# Patient Record
Sex: Male | Born: 1940
Health system: Southern US, Community
[De-identification: ages and names within clinical notes are randomized; demographics above are authoritative.]

## PROBLEM LIST (undated history)

## (undated) DIAGNOSIS — R569 Unspecified convulsions: Secondary | ICD-10-CM

## (undated) DIAGNOSIS — J302 Other seasonal allergic rhinitis: Secondary | ICD-10-CM

## (undated) DIAGNOSIS — I499 Cardiac arrhythmia, unspecified: Secondary | ICD-10-CM

## (undated) DIAGNOSIS — K219 Gastro-esophageal reflux disease without esophagitis: Secondary | ICD-10-CM

## (undated) DIAGNOSIS — J449 Chronic obstructive pulmonary disease, unspecified: Secondary | ICD-10-CM

## (undated) DIAGNOSIS — Z974 Presence of external hearing-aid: Secondary | ICD-10-CM

## (undated) DIAGNOSIS — J42 Unspecified chronic bronchitis: Secondary | ICD-10-CM

## (undated) DIAGNOSIS — I779 Disorder of arteries and arterioles, unspecified: Secondary | ICD-10-CM

## (undated) DIAGNOSIS — I219 Acute myocardial infarction, unspecified: Secondary | ICD-10-CM

## (undated) DIAGNOSIS — R7303 Prediabetes: Secondary | ICD-10-CM

## (undated) DIAGNOSIS — I6529 Occlusion and stenosis of unspecified carotid artery: Secondary | ICD-10-CM

## (undated) DIAGNOSIS — A879 Viral meningitis, unspecified: Secondary | ICD-10-CM

## (undated) DIAGNOSIS — I639 Cerebral infarction, unspecified: Secondary | ICD-10-CM

## (undated) DIAGNOSIS — R053 Chronic cough: Secondary | ICD-10-CM

## (undated) DIAGNOSIS — Z972 Presence of dental prosthetic device (complete) (partial): Secondary | ICD-10-CM

## (undated) DIAGNOSIS — R011 Cardiac murmur, unspecified: Secondary | ICD-10-CM

## (undated) DIAGNOSIS — M1990 Unspecified osteoarthritis, unspecified site: Secondary | ICD-10-CM

## (undated) DIAGNOSIS — E7800 Pure hypercholesterolemia, unspecified: Secondary | ICD-10-CM

## (undated) DIAGNOSIS — I700 Atherosclerosis of aorta: Secondary | ICD-10-CM

## (undated) DIAGNOSIS — N289 Disorder of kidney and ureter, unspecified: Secondary | ICD-10-CM

## (undated) DIAGNOSIS — I739 Peripheral vascular disease, unspecified: Secondary | ICD-10-CM

## (undated) DIAGNOSIS — J45909 Unspecified asthma, uncomplicated: Secondary | ICD-10-CM

## (undated) DIAGNOSIS — H3530 Unspecified macular degeneration: Secondary | ICD-10-CM

## (undated) DIAGNOSIS — I10 Essential (primary) hypertension: Secondary | ICD-10-CM

## (undated) DIAGNOSIS — I6523 Occlusion and stenosis of bilateral carotid arteries: Secondary | ICD-10-CM

## (undated) DIAGNOSIS — I2510 Atherosclerotic heart disease of native coronary artery without angina pectoris: Secondary | ICD-10-CM

## (undated) DIAGNOSIS — R05 Cough: Secondary | ICD-10-CM

## (undated) DIAGNOSIS — J40 Bronchitis, not specified as acute or chronic: Secondary | ICD-10-CM

## (undated) DIAGNOSIS — H9190 Unspecified hearing loss, unspecified ear: Secondary | ICD-10-CM

## (undated) DIAGNOSIS — G459 Transient cerebral ischemic attack, unspecified: Secondary | ICD-10-CM

## (undated) HISTORY — DX: Other seasonal allergic rhinitis: J30.2

## (undated) HISTORY — PX: URETERAL STENT PLACEMENT: SHX822

## (undated) HISTORY — DX: Unspecified macular degeneration: H35.30

## (undated) HISTORY — DX: Cerebral infarction, unspecified: I63.9

## (undated) HISTORY — DX: Acute myocardial infarction, unspecified: I21.9

## (undated) HISTORY — DX: Disorder of arteries and arterioles, unspecified: I77.9

## (undated) HISTORY — DX: Unspecified chronic bronchitis: J42

## (undated) HISTORY — DX: Unspecified convulsions: R56.9

## (undated) HISTORY — DX: Peripheral vascular disease, unspecified: I73.9

## (undated) HISTORY — PX: CORONARY ARTERY BYPASS GRAFT: SHX141

## (undated) HISTORY — DX: Disorder of kidney and ureter, unspecified: N28.9

## (undated) HISTORY — PX: SKIN LESION EXCISION: SHX2412

## (undated) HISTORY — DX: Essential (primary) hypertension: I10

---

## 1989-04-28 DIAGNOSIS — G459 Transient cerebral ischemic attack, unspecified: Secondary | ICD-10-CM

## 1989-04-28 HISTORY — PX: CAROTID ENDARTERECTOMY: SUR193

## 1989-04-28 HISTORY — DX: Transient cerebral ischemic attack, unspecified: G45.9

## 2003-11-08 ENCOUNTER — Other Ambulatory Visit: Payer: Self-pay

## 2004-11-12 ENCOUNTER — Ambulatory Visit: Payer: Self-pay | Admitting: Surgery

## 2005-07-17 ENCOUNTER — Ambulatory Visit: Payer: Self-pay

## 2005-11-03 ENCOUNTER — Ambulatory Visit: Payer: Self-pay

## 2005-12-09 ENCOUNTER — Ambulatory Visit: Payer: Self-pay | Admitting: Surgery

## 2006-12-03 ENCOUNTER — Ambulatory Visit: Payer: Self-pay | Admitting: Surgery

## 2007-03-19 ENCOUNTER — Emergency Department: Payer: Self-pay | Admitting: Emergency Medicine

## 2007-03-19 ENCOUNTER — Other Ambulatory Visit: Payer: Self-pay

## 2007-05-06 ENCOUNTER — Ambulatory Visit: Payer: Self-pay | Admitting: Gastroenterology

## 2008-01-18 ENCOUNTER — Ambulatory Visit: Payer: Self-pay | Admitting: Family Medicine

## 2009-04-02 ENCOUNTER — Ambulatory Visit: Payer: Self-pay | Admitting: Gastroenterology

## 2009-08-20 ENCOUNTER — Ambulatory Visit: Payer: Self-pay | Admitting: Vascular Surgery

## 2009-12-12 ENCOUNTER — Ambulatory Visit: Payer: Self-pay | Admitting: Surgery

## 2012-02-07 ENCOUNTER — Ambulatory Visit: Payer: Self-pay | Admitting: Internal Medicine

## 2012-04-28 HISTORY — PX: CAROTID ENDARTERECTOMY: SUR193

## 2012-09-10 ENCOUNTER — Ambulatory Visit: Payer: Self-pay | Admitting: Emergency Medicine

## 2012-10-28 ENCOUNTER — Ambulatory Visit: Payer: Self-pay | Admitting: Vascular Surgery

## 2012-11-08 ENCOUNTER — Ambulatory Visit: Payer: Self-pay | Admitting: Vascular Surgery

## 2012-11-08 LAB — CREATININE, SERUM
Creatinine: 1.46 mg/dL — ABNORMAL HIGH (ref 0.60–1.30)
EGFR (African American): 55 — ABNORMAL LOW
EGFR (Non-African Amer.): 47 — ABNORMAL LOW

## 2012-11-08 LAB — BUN: BUN: 23 mg/dL — ABNORMAL HIGH (ref 7–18)

## 2012-11-17 ENCOUNTER — Ambulatory Visit: Payer: Self-pay | Admitting: Vascular Surgery

## 2012-11-17 LAB — BASIC METABOLIC PANEL
Anion Gap: 5 — ABNORMAL LOW (ref 7–16)
BUN: 30 mg/dL — ABNORMAL HIGH (ref 7–18)
Calcium, Total: 8.9 mg/dL (ref 8.5–10.1)
Chloride: 105 mmol/L (ref 98–107)
Co2: 28 mmol/L (ref 21–32)
Creatinine: 1.58 mg/dL — ABNORMAL HIGH (ref 0.60–1.30)
EGFR (African American): 50 — ABNORMAL LOW
EGFR (Non-African Amer.): 43 — ABNORMAL LOW
Glucose: 129 mg/dL — ABNORMAL HIGH (ref 65–99)
Osmolality: 284 (ref 275–301)
Potassium: 4.2 mmol/L (ref 3.5–5.1)
Sodium: 138 mmol/L (ref 136–145)

## 2012-11-17 LAB — CBC
HCT: 38.3 % — ABNORMAL LOW (ref 40.0–52.0)
HGB: 13.1 g/dL (ref 13.0–18.0)
MCH: 35.2 pg — ABNORMAL HIGH (ref 26.0–34.0)
MCHC: 34.3 g/dL (ref 32.0–36.0)
MCV: 103 fL — ABNORMAL HIGH (ref 80–100)
Platelet: 285 10*3/uL (ref 150–440)
RBC: 3.73 10*6/uL — ABNORMAL LOW (ref 4.40–5.90)
RDW: 14 % (ref 11.5–14.5)
WBC: 8.9 10*3/uL (ref 3.8–10.6)

## 2012-11-24 ENCOUNTER — Inpatient Hospital Stay: Payer: Self-pay | Admitting: Vascular Surgery

## 2012-11-25 LAB — CBC WITH DIFFERENTIAL/PLATELET
Basophil #: 0.1 10*3/uL (ref 0.0–0.1)
Basophil %: 0.5 %
Eosinophil #: 0.1 10*3/uL (ref 0.0–0.7)
Eosinophil %: 0.5 %
HCT: 34.2 % — ABNORMAL LOW (ref 40.0–52.0)
HGB: 12 g/dL — ABNORMAL LOW (ref 13.0–18.0)
Lymphocyte #: 1.3 10*3/uL (ref 1.0–3.6)
Lymphocyte %: 11.6 %
MCH: 35.1 pg — ABNORMAL HIGH (ref 26.0–34.0)
MCHC: 35.1 g/dL (ref 32.0–36.0)
MCV: 100 fL (ref 80–100)
Monocyte #: 1 x10 3/mm (ref 0.2–1.0)
Monocyte %: 9.1 %
Neutrophil #: 9 10*3/uL — ABNORMAL HIGH (ref 1.4–6.5)
Neutrophil %: 78.3 %
Platelet: 211 10*3/uL (ref 150–440)
RBC: 3.42 10*6/uL — ABNORMAL LOW (ref 4.40–5.90)
RDW: 13.4 % (ref 11.5–14.5)
WBC: 11.5 10*3/uL — ABNORMAL HIGH (ref 3.8–10.6)

## 2012-11-25 LAB — BASIC METABOLIC PANEL
Anion Gap: 6 — ABNORMAL LOW (ref 7–16)
BUN: 18 mg/dL (ref 7–18)
Calcium, Total: 8.2 mg/dL — ABNORMAL LOW (ref 8.5–10.1)
Chloride: 107 mmol/L (ref 98–107)
Co2: 24 mmol/L (ref 21–32)
Creatinine: 1.48 mg/dL — ABNORMAL HIGH (ref 0.60–1.30)
EGFR (African American): 54 — ABNORMAL LOW
EGFR (Non-African Amer.): 47 — ABNORMAL LOW
Glucose: 128 mg/dL — ABNORMAL HIGH (ref 65–99)
Osmolality: 277 (ref 275–301)
Potassium: 4.2 mmol/L (ref 3.5–5.1)
Sodium: 137 mmol/L (ref 136–145)

## 2012-11-25 LAB — APTT: Activated PTT: 34.9 secs (ref 23.6–35.9)

## 2012-11-25 LAB — PATHOLOGY REPORT

## 2012-11-25 LAB — PROTIME-INR
INR: 1
Prothrombin Time: 13.4 secs (ref 11.5–14.7)

## 2012-12-04 ENCOUNTER — Ambulatory Visit: Payer: Self-pay | Admitting: Internal Medicine

## 2013-04-22 ENCOUNTER — Ambulatory Visit: Payer: Self-pay | Admitting: Physician Assistant

## 2013-10-01 ENCOUNTER — Inpatient Hospital Stay: Payer: Self-pay | Admitting: Internal Medicine

## 2013-10-01 LAB — CBC
HCT: 42 % (ref 40.0–52.0)
HGB: 13.9 g/dL (ref 13.0–18.0)
MCH: 33.6 pg (ref 26.0–34.0)
MCHC: 33 g/dL (ref 32.0–36.0)
MCV: 102 fL — ABNORMAL HIGH (ref 80–100)
Platelet: 308 10*3/uL (ref 150–440)
RBC: 4.13 10*6/uL — ABNORMAL LOW (ref 4.40–5.90)
RDW: 12.6 % (ref 11.5–14.5)
WBC: 10.5 10*3/uL (ref 3.8–10.6)

## 2013-10-01 LAB — TROPONIN I
Troponin-I: 0.08 ng/mL — ABNORMAL HIGH
Troponin-I: 3.9 ng/mL — ABNORMAL HIGH
Troponin-I: 5.2 ng/mL — ABNORMAL HIGH

## 2013-10-01 LAB — COMPREHENSIVE METABOLIC PANEL
Albumin: 3.6 g/dL (ref 3.4–5.0)
Alkaline Phosphatase: 85 U/L
Anion Gap: 8 (ref 7–16)
BUN: 17 mg/dL (ref 7–18)
Bilirubin,Total: 0.4 mg/dL (ref 0.2–1.0)
Calcium, Total: 8.6 mg/dL (ref 8.5–10.1)
Chloride: 104 mmol/L (ref 98–107)
Co2: 23 mmol/L (ref 21–32)
Creatinine: 1.48 mg/dL — ABNORMAL HIGH (ref 0.60–1.30)
EGFR (African American): 54 — ABNORMAL LOW
EGFR (Non-African Amer.): 46 — ABNORMAL LOW
Glucose: 148 mg/dL — ABNORMAL HIGH (ref 65–99)
Osmolality: 274 (ref 275–301)
Potassium: 3.5 mmol/L (ref 3.5–5.1)
SGOT(AST): 25 U/L (ref 15–37)
SGPT (ALT): 23 U/L (ref 12–78)
Sodium: 135 mmol/L — ABNORMAL LOW (ref 136–145)
Total Protein: 7.6 g/dL (ref 6.4–8.2)

## 2013-10-01 LAB — CK TOTAL AND CKMB (NOT AT ARMC)
CK, Total: 226 U/L
CK, Total: 265 U/L
CK-MB: 17.5 ng/mL — ABNORMAL HIGH (ref 0.5–3.6)
CK-MB: 21.5 ng/mL — ABNORMAL HIGH (ref 0.5–3.6)

## 2013-10-01 LAB — APTT
Activated PTT: 33.3 secs (ref 23.6–35.9)
Activated PTT: 50.7 secs — ABNORMAL HIGH (ref 23.6–35.9)

## 2013-10-01 LAB — PRO B NATRIURETIC PEPTIDE: B-Type Natriuretic Peptide: 624 pg/mL — ABNORMAL HIGH (ref 0–125)

## 2013-10-01 LAB — PROTIME-INR
INR: 1
Prothrombin Time: 13.4 secs (ref 11.5–14.7)

## 2013-10-02 LAB — CBC WITH DIFFERENTIAL/PLATELET
Basophil #: 0.1 10*3/uL (ref 0.0–0.1)
Basophil %: 0.7 %
Eosinophil #: 0.2 10*3/uL (ref 0.0–0.7)
Eosinophil %: 1.7 %
HCT: 41.9 % (ref 40.0–52.0)
HGB: 13.7 g/dL (ref 13.0–18.0)
Lymphocyte #: 1.2 10*3/uL (ref 1.0–3.6)
Lymphocyte %: 11.9 %
MCH: 33.4 pg (ref 26.0–34.0)
MCHC: 32.6 g/dL (ref 32.0–36.0)
MCV: 103 fL — ABNORMAL HIGH (ref 80–100)
Monocyte #: 1.1 x10 3/mm — ABNORMAL HIGH (ref 0.2–1.0)
Monocyte %: 10.2 %
Neutrophil #: 7.9 10*3/uL — ABNORMAL HIGH (ref 1.4–6.5)
Neutrophil %: 75.5 %
Platelet: 331 10*3/uL (ref 150–440)
RBC: 4.09 10*6/uL — ABNORMAL LOW (ref 4.40–5.90)
RDW: 12.9 % (ref 11.5–14.5)
WBC: 10.5 10*3/uL (ref 3.8–10.6)

## 2013-10-02 LAB — BASIC METABOLIC PANEL
Anion Gap: 5 — ABNORMAL LOW (ref 7–16)
BUN: 16 mg/dL (ref 7–18)
Calcium, Total: 8.8 mg/dL (ref 8.5–10.1)
Chloride: 108 mmol/L — ABNORMAL HIGH (ref 98–107)
Co2: 27 mmol/L (ref 21–32)
Creatinine: 1.46 mg/dL — ABNORMAL HIGH (ref 0.60–1.30)
EGFR (African American): 55 — ABNORMAL LOW
EGFR (Non-African Amer.): 47 — ABNORMAL LOW
Glucose: 115 mg/dL — ABNORMAL HIGH (ref 65–99)
Osmolality: 282 (ref 275–301)
Potassium: 4.6 mmol/L (ref 3.5–5.1)
Sodium: 140 mmol/L (ref 136–145)

## 2013-10-02 LAB — LIPID PANEL
Cholesterol: 133 mg/dL (ref 0–200)
HDL Cholesterol: 35 mg/dL — ABNORMAL LOW (ref 40–60)
Ldl Cholesterol, Calc: 75 mg/dL (ref 0–100)
Triglycerides: 114 mg/dL (ref 0–200)
VLDL Cholesterol, Calc: 23 mg/dL (ref 5–40)

## 2013-10-02 LAB — APTT
Activated PTT: 51.7 secs — ABNORMAL HIGH (ref 23.6–35.9)
Activated PTT: 56.6 secs — ABNORMAL HIGH (ref 23.6–35.9)
Activated PTT: 80.8 secs — ABNORMAL HIGH (ref 23.6–35.9)

## 2013-10-02 LAB — TSH: Thyroid Stimulating Horm: 1.48 u[IU]/mL

## 2013-10-02 LAB — HEMOGLOBIN A1C: Hemoglobin A1C: 6.6 % — ABNORMAL HIGH (ref 4.2–6.3)

## 2013-10-02 LAB — CK-MB: CK-MB: 15.1 ng/mL — ABNORMAL HIGH (ref 0.5–3.6)

## 2013-10-03 LAB — BASIC METABOLIC PANEL
Anion Gap: 5 — ABNORMAL LOW (ref 7–16)
BUN: 12 mg/dL (ref 7–18)
Calcium, Total: 8.6 mg/dL (ref 8.5–10.1)
Chloride: 109 mmol/L — ABNORMAL HIGH (ref 98–107)
Co2: 28 mmol/L (ref 21–32)
Creatinine: 1.5 mg/dL — ABNORMAL HIGH (ref 0.60–1.30)
EGFR (African American): 53 — ABNORMAL LOW
EGFR (Non-African Amer.): 46 — ABNORMAL LOW
Glucose: 105 mg/dL — ABNORMAL HIGH (ref 65–99)
Osmolality: 283 (ref 275–301)
Potassium: 4.7 mmol/L (ref 3.5–5.1)
Sodium: 142 mmol/L (ref 136–145)

## 2013-10-03 LAB — APTT: Activated PTT: 106.6 secs — ABNORMAL HIGH (ref 23.6–35.9)

## 2013-10-04 LAB — BASIC METABOLIC PANEL
Anion Gap: 4 — ABNORMAL LOW (ref 7–16)
BUN: 10 mg/dL (ref 7–18)
Calcium, Total: 8.6 mg/dL (ref 8.5–10.1)
Chloride: 106 mmol/L (ref 98–107)
Co2: 30 mmol/L (ref 21–32)
Creatinine: 1.49 mg/dL — ABNORMAL HIGH (ref 0.60–1.30)
EGFR (African American): 53 — ABNORMAL LOW
EGFR (Non-African Amer.): 46 — ABNORMAL LOW
Glucose: 108 mg/dL — ABNORMAL HIGH (ref 65–99)
Osmolality: 279 (ref 275–301)
Potassium: 4 mmol/L (ref 3.5–5.1)
Sodium: 140 mmol/L (ref 136–145)

## 2013-10-04 LAB — APTT: Activated PTT: 36.8 secs — ABNORMAL HIGH (ref 23.6–35.9)

## 2013-10-05 ENCOUNTER — Ambulatory Visit (HOSPITAL_COMMUNITY)
Admission: AD | Admit: 2013-10-05 | Discharge: 2013-10-05 | Disposition: A | Discharge status: Home / Self Care | Payer: Medicare HMO | Source: Ambulatory Visit | Attending: Family Medicine | Admitting: Family Medicine

## 2013-10-05 DIAGNOSIS — R079 Chest pain, unspecified: Secondary | ICD-10-CM | POA: Insufficient documentation

## 2013-10-06 DIAGNOSIS — I10 Essential (primary) hypertension: Secondary | ICD-10-CM | POA: Insufficient documentation

## 2013-10-07 DIAGNOSIS — Z951 Presence of aortocoronary bypass graft: Secondary | ICD-10-CM | POA: Insufficient documentation

## 2013-10-10 DIAGNOSIS — D62 Acute posthemorrhagic anemia: Secondary | ICD-10-CM | POA: Insufficient documentation

## 2013-10-10 DIAGNOSIS — I4891 Unspecified atrial fibrillation: Secondary | ICD-10-CM | POA: Insufficient documentation

## 2014-03-02 DIAGNOSIS — E785 Hyperlipidemia, unspecified: Secondary | ICD-10-CM | POA: Insufficient documentation

## 2014-04-24 ENCOUNTER — Ambulatory Visit: Payer: Self-pay | Admitting: Internal Medicine

## 2014-04-28 DIAGNOSIS — I219 Acute myocardial infarction, unspecified: Secondary | ICD-10-CM

## 2014-04-28 HISTORY — DX: Acute myocardial infarction, unspecified: I21.9

## 2014-06-08 DIAGNOSIS — I10 Essential (primary) hypertension: Secondary | ICD-10-CM | POA: Diagnosis not present

## 2014-06-08 DIAGNOSIS — J310 Chronic rhinitis: Secondary | ICD-10-CM | POA: Diagnosis not present

## 2014-06-08 DIAGNOSIS — K219 Gastro-esophageal reflux disease without esophagitis: Secondary | ICD-10-CM | POA: Diagnosis not present

## 2014-06-08 DIAGNOSIS — E785 Hyperlipidemia, unspecified: Secondary | ICD-10-CM | POA: Diagnosis not present

## 2014-06-28 DIAGNOSIS — Z23 Encounter for immunization: Secondary | ICD-10-CM | POA: Diagnosis not present

## 2014-06-28 DIAGNOSIS — N289 Disorder of kidney and ureter, unspecified: Secondary | ICD-10-CM | POA: Diagnosis not present

## 2014-06-28 DIAGNOSIS — I213 ST elevation (STEMI) myocardial infarction of unspecified site: Secondary | ICD-10-CM | POA: Diagnosis not present

## 2014-06-28 DIAGNOSIS — I639 Cerebral infarction, unspecified: Secondary | ICD-10-CM | POA: Diagnosis not present

## 2014-06-28 DIAGNOSIS — J302 Other seasonal allergic rhinitis: Secondary | ICD-10-CM | POA: Diagnosis not present

## 2014-06-28 DIAGNOSIS — I10 Essential (primary) hypertension: Secondary | ICD-10-CM | POA: Diagnosis not present

## 2014-07-13 DIAGNOSIS — I700 Atherosclerosis of aorta: Secondary | ICD-10-CM | POA: Insufficient documentation

## 2014-07-13 DIAGNOSIS — I25118 Atherosclerotic heart disease of native coronary artery with other forms of angina pectoris: Secondary | ICD-10-CM | POA: Diagnosis not present

## 2014-07-13 DIAGNOSIS — I10 Essential (primary) hypertension: Secondary | ICD-10-CM | POA: Diagnosis not present

## 2014-07-13 DIAGNOSIS — I6529 Occlusion and stenosis of unspecified carotid artery: Secondary | ICD-10-CM | POA: Insufficient documentation

## 2014-07-13 DIAGNOSIS — I2510 Atherosclerotic heart disease of native coronary artery without angina pectoris: Secondary | ICD-10-CM | POA: Insufficient documentation

## 2014-07-13 DIAGNOSIS — N183 Chronic kidney disease, stage 3 unspecified: Secondary | ICD-10-CM | POA: Insufficient documentation

## 2014-07-13 DIAGNOSIS — I6523 Occlusion and stenosis of bilateral carotid arteries: Secondary | ICD-10-CM | POA: Insufficient documentation

## 2014-07-13 DIAGNOSIS — E782 Mixed hyperlipidemia: Secondary | ICD-10-CM | POA: Diagnosis not present

## 2014-08-02 DIAGNOSIS — N183 Chronic kidney disease, stage 3 (moderate): Secondary | ICD-10-CM | POA: Diagnosis not present

## 2014-08-02 DIAGNOSIS — E875 Hyperkalemia: Secondary | ICD-10-CM | POA: Diagnosis not present

## 2014-08-02 DIAGNOSIS — I701 Atherosclerosis of renal artery: Secondary | ICD-10-CM | POA: Diagnosis not present

## 2014-08-18 NOTE — Op Note (Signed)
PATIENT NAME:  Keith Cruz, Keith Cruz MR#:  [id number] DATE OF BIRTH:  10-07-1940  DATE OF PROCEDURE:  11/24/2012  PREOPERATIVE DIAGNOSES: 1.  High-grade left carotid artery stenosis.  2.  Chronic kidney disease.  3.  Hypertension.   POSTOPERATIVE DIAGNOSES: 1.  High-grade left carotid artery stenosis.  2.  Chronic kidney disease.  3.  Hypertension.   PROCEDURE:  Left carotid endarterectomy with CorMatrix extracellular patch reconstruction.   SURGEON: Algernon Huxley, M.D.   ANESTHESIA: General.   ESTIMATED BLOOD LOSS: 50 mL.   INDICATION FOR PROCEDURE: This is a 74 year old gentleman with  long-standing history of carotid artery disease. His ultrasound this summer demonstrated significant progression and a catheter-based angiogram confirmed worsening stenosis. He is brought in for stroke risk reduction due to his high grade left carotid artery stenosis. Risks and benefits were discussed. Informed consent was obtained.   DESCRIPTION OF PROCEDURE: The patient is brought to the operative suite, and after an adequate level of general anesthesia obtained, the left neck and chest sterilely prepped and draped and a sterile surgical field was created. I pulled him over because it will be a break X. The incision was created along the anterior border of the sternocleidomastoid. We dissected down through the platysma with electrocautery. The sternocleidomastoid was retracted laterally. The facial vein was ligated and divided between silk ties exposing the carotid bifurcation. The common carotid artery, external carotid artery, superior thyroid artery and internal carotid artery distal lesion were all encircled with vessel loops. The patient was given 6000 units of intravenous Heparin  systemic anticoagulation and after this was allowed to circulate for 5 minutes, control was pulled up on the vessel loops. An anterior wall arteriotomy was created with an 11 blade and extended with Potts scissors. The Pruitt-Inahara  shunt was then placed first in the internal carotid artery, flushed and de-aired, then in the common carotid artery. Flow was restored within 2 minutes of clamp. I then performed an endarterectomy in the standard fashion. The elevator was used to free the plaque. An eversion endarterectomy was performed on the external carotid artery. The proximal endpoint was cut flush with tenotomy scissors and a nice feathered endpoint was created with gentle traction on the internal carotid artery. All loose flecks were removed, the distal endpoint was tacked down with three 7-0 Prolene sutures. The CorMatrix extracellular patch was then brought onto the field to reconstruct the artery. It was cut and beveled distally, started at the distal endpoint and run approximately one half the length of the arteriotomy with a 6-0 Prolene suture. The patch was then cut and beveled to an appropriate length to match the arteriotomy proximally and a second 6-0 Prolene was started at the proximal endpoint. The medial suture line was run together and tied. The lateral suture line was run until the shunt needed to be removed. The shunt was then removed and the vessel was locally heparinized and flushed from the internal and external and common carotid arteries. The suture line was then completed. Approximately 2 to 3 minutes elapsed from shunt removal to restoration of flow. Several cardiac cycles were allowed to traverse up the external carotid artery prior to release of control of the internal carotid artery. On release, there was a nice pulse in the internal carotid artery. A single 6-0 Prolene patch suture was used for hemostasis. The wound was then irrigated. Surgicel and Evicel topical hemostatic agents were placed and hemostasis was complete. The wound was then closed with 3 interrupted 3-0  Vicryl sutures in the sternocleidomastoid space. The platysma was closed with a running 3-0 Vicryl and skin was closed with 4-0 Monocryl. Dermabond  was placed as a dressing. The patient tolerated the procedure well and was taken to the recovery room in stable condition.   ____________________________ Algernon Huxley, MD jsd:dp D: 11/24/2012 10:15:52 ET T: 11/24/2012 11:18:00 ET JOB#: [id number]  cc: Algernon Huxley, MD, <Dictator> Milinda Pointer. Jacqualine Code, MD  Algernon Huxley MD ELECTRONICALLY SIGNED 12/02/2012 8:15

## 2014-08-18 NOTE — Op Note (Signed)
PATIENT NAME:  Keith Cruz, Keith Cruz MR#:  [id number] DATE OF BIRTH:  1941-01-18  DATE OF PROCEDURE:  11/08/2012  PREOPERATIVE DIAGNOSES: 1.  Carotid artery stenosis.  2.  Chronic kidney disease, precluding CT angiogram.  3.  Renal artery stenosis.  4.  Renovascular hypertension.   POSTOPERATIVE DIAGNOSES: 1.  Carotid artery stenosis.  2.  Chronic kidney disease, precluding CT angiogram.  3.  Renal artery stenosis.  4.  Renovascular hypertension.   PROCEDURES: 1.  Ultrasound guidance for vascular access, right femoral artery.  2.  Catheter placement to left common carotid artery from right femoral approach.  3.  Thoracic aortogram and left cervical and cerebral carotid arteriograms.  4.  StarClose closure device, right femoral artery.   SURGEON: Kiyla Ringler.   ANESTHESIA: Local, with moderate conscious sedation.   BLOOD LOSS: Minimal.   FLUOROSCOPY TIME: 3.8 minutes.   CONTRAST USED: 45 mL.   INDICATION FOR PROCEDURE: This is a 74 year old white male with chronic kidney disease. He has carotid artery stenosis. He has had a previous right carotid endarterectomy. His left carotid artery stenosis has progressed on a recent noninvasive study. I ordered a CT angiogram, but his chronic kidney disease precluded this, and we brought him in for a catheter-based angiogram with a limited amount of contrast for further evaluation to determine the best treatment options for him.   Risks and benefits were discussed. Informed consent was obtained.   DESCRIPTION OF PROCEDURE: The patient was brought to the vascular and interventional radiology suite. Groins were shaved and prepped and a sterile surgical field was created. Ultrasound was used to visualize a patent right femoral artery. This was accessed under direct ultrasound guidance without difficulty with a Seldinger needle. A J wire and 5-French sheath were placed. The patient was given 3000 units of intravenous heparin for systemic anticoagulation and a  pigtail catheter was placed in the ascending aorta. LAO projection thoracic aortogram was performed. This demonstrated normal origins of the innominate left carotid artery and left subclavian artery. On the initial thoracic aortogram you could see that both vertebrals appeared reasonably large and patent. The right carotid artery appeared patent, although the visualization was somewhat limited. He has had previous endarterectomies, and his ultrasound showed this side to be patent.   I then selectively cannulated the left common carotid artery with a Bentson-Hanafee catheter. I advanced in the mid-left common carotid artery. Selective left cervical and cerebral carotid angiography was then performed. This demonstrated no intracranial filling defects that were obvious, with reasonably normal middle cerebral and anterior cerebral artery on initial imaging, although the anterior cerebral was a bit pruned.   The cervical carotid artery demonstrated an approximately 2 cm long irregular stenosis starting just below the carotid bifurcation, extending into the internal carotid artery. This measured out at approximately 80% to 85%. The external carotid artery also had a calcific stenosis. AP, LAO and lateral projections were performed for evaluation of this. After we had several views to evaluate the carotid circulation I elected to terminate the procedure. The diagnostic catheter was removed. An oblique arteriogram was performed through the right femoral sheath, and a StarClose closure device was deployed in the usual fashion with excellent hemostatic result. The patient tolerated the procedure well and was taken to the recovery room in stable condition.     ____________________________ Algernon Huxley, MD jsd:dm D: 11/08/2012 09:15:29 ET T: 11/08/2012 09:38:57 ET JOB#: [id number]  cc: Algernon Huxley, MD, <Dictator> Milinda Pointer. Jacqualine Code, MD Algernon Huxley MD  ELECTRONICALLY SIGNED 11/11/2012 9:51

## 2014-08-19 NOTE — H&P (Signed)
PATIENT NAME:  Keith Cruz, Keith Cruz MR#:  [id number] DATE OF BIRTH:  [birth year]  DATE OF ADMISSION:  10/01/2013  PRIMARY CARE PROVIDER: Dr. Jacqualine Code.  EMERGENCY DEPARTMENT REFERRING PHYSICIAN: Dr. Jasmine December.   CHIEF COMPLAINT: Chest pain.   HISTORY OF PRESENT ILLNESS: The patient is a 74 year old white male with previous history of paroxysmal A. fib, history of CVA/TIA in 1991, hypertension, hyperlipidemia, GERD, anxiety, chronic renal failure, who presents with chest pain ongoing for about one year. The patient reports that it initially started with substernal sharp pain going across his chest with mowing the lawn and doing other activities, but he told no one. Over the past few months, his pain has progressively gotten worse and now is more frequent, and over the past month, he has been having pain only at rest. Yesterday started having chest pain that was continuous. He finally decided to come to the ED. He also has now pain radiating to the left arm. He also felt nauseous. He has not had any emesis. He is denying any dyspnea on exertion. Denies any fevers, chills. No urinary symptoms.   PAST MEDICAL HISTORY: 1.  History of paroxysmal A. fib.  2.  History of CVA/TIA in 1991.  3.  Hypertension.  4.  Hyperlipidemia.  5.  Gastroesophageal reflux disease.  6.  Anxiety disorder.  7.  Chronic renal failure.   PAST SURGICAL HISTORY: Status post stent to the right kidney. Status post left carotid endarterectomy. Status post right carotid endarterectomy.   ALLERGIES: ALLEGRA-D 12, PREDNISONE, PROCARDIA.   CURRENT MEDICATIONS: He is on vitamin D3 at 2000 international units daily, vitamin B12 at 1000 mcg daily, simvastatin 40 at bedtime, Protonix 40 daily, metoprolol succinate 50 one tab p.o. daily, hydralazine 25 one tab p.o. b.i.d., folic acid 0.8 daily, fexofenadine 180 daily, Plavix 75 p.o. daily, Centrum Silver 1 tab p.o. daily, aspirin 81 one tab p.o. daily, alprazolam 0.5 once a day as needed.    SOCIAL HISTORY: Used to smoke, but quit in 1972. No alcohol or drug use.  FAMILY HISTORY: Positive for hypertension.   REVIEW OF SYSTEMS:    CONSTITUTIONAL: Denies any fevers. Complains of some fatigue and weakness. Complains of chest pain. No weight loss. No weight gain.  EYES: No blurred or double vision. No pain. No redness. No inflammation.  EARS, NOSE, THROAT: No tinnitus. No ear pain. No hearing loss. No seasonal or year-round allergies. No epistaxis. No difficulty swallowing.  RESPIRATORY: Denies any cough, wheezing, hemoptysis. No COPD.  CARDIOVASCULAR: Complains of chest pain as above. Has history of paroxysmal A. fib. Denies any palpitations or syncope.  GASTROINTESTINAL: Complains of some nausea but no vomiting or diarrhea. No abdominal pain. No hematemesis. No melena.  GENITOURINARY: Denies any dysuria, hematuria, renal calculus or frequency.  ENDOCRINE: Denies any polyuria, nocturia.  HEMATOLOGIC AND LYMPHATIC: Denies anemia, easy bruisability or bleeding.  SKIN: Denies acne, rash, changes in mole, hair or skin.  MUSCULOSKELETAL: Denies any pain in the neck, back or shoulder.  NEUROLOGICAL: Has a history of CVA and TIA without any residual effects.  PSYCHIATRIC: Has anxiety but no depression.   PHYSICAL EXAMINATION: VITAL SIGNS: Temperature 98.1, pulse 98, respirations 11, blood pressure 165/79, O2 sat 97%.  GENERAL: The patient is a well-developed male in no acute distress.  HEENT: Head atraumatic, normocephalic. Pupils equally round, reactive to light and accommodation. There is no conjunctival pallor. No scleral icterus. Nasal exam shows no drainage or ulceration. Oropharynx is clear without any exudate.  NECK: Supple without  any JVD.  CARDIOVASCULAR: Regular rate and rhythm. No murmurs, rubs, clicks or gallops.  LUNGS: Clear to auscultation bilaterally without any rales, rhonchi, wheezing.  ABDOMEN: Soft, nontender, nondistended. Positive bowel sounds x 4. No  hepatosplenomegaly.  EXTREMITIES: No clubbing, cyanosis or edema.  SKIN: No rash.  LYMPHATIC: No lymph nodes palpable.  VASCULAR: Good DP, PT pulses.  PSYCHIATRIC: Not anxious or depressed.  NEUROLOGIC: Cranial nerves II through XII grossly intact. No focal deficits.   LABORATORY AND DIAGNOSTIC DATA: EKG shows 1 mm ST depression in leads V3 and V4. Otherwise normal sinus rhythm. Glucose 148, BUN 17, creatinine 1.48; sodium was 135, potassium 3.5, chloride 104; CO2 of 23. LFTs were normal. Troponin 0.08. WBC 10.5, hemoglobin 13.9; platelet count is 308.   ASSESSMENT AND PLAN: The patient is a 74 year old white male with history of severe peripheral vascular disease, hypertension, cerebrovascular accident, transient ischemic attack, presents with chest pain.  1.  Chest pain: Represents unstable angina, possible non-ST myocardial infarction. At this time will continue his aspirin and Plavix, which he was on. Continue the nitroglycerin patch. He is already on a heparin drip, which we will continue. Continue metoprolol, simvastatin. Cardiology consult. The patient will need a cardiac catheterization to further evaluate his symptoms.  2.  Chronic renal failure: Will provide him with normal saline infusion in light of possibly receiving contrast for the cath. Will get nephrology to evaluate the patient.  3.  Hypertension: Continue metoprolol and hydralazine. He will also be on nitroglycerin. 4.  Hyperlipidemia: Continue simvastatin. Will check a lipid panel in the a.m.  5.  Gastroesophageal reflux disease: Will continue Protonix. However, in light of the patient being on Plavix, consider changing him to an H2-blocker.   6.  Elevated blood sugar: Will check a hemoglobin A1c in the morning.    TIME SPENT: 50 minutes.    ____________________________ Lafonda Mosses Posey Pronto, MD shp:jcm D: 10/01/2013 12:54:36 ET T: 10/01/2013 16:24:59 ET JOB#: [id number]  cc: Monroe Qin H. Posey Pronto, MD, <Dictator> Alric Seton  MD ELECTRONICALLY SIGNED 10/11/2013 16:54

## 2014-08-19 NOTE — Discharge Summary (Signed)
PATIENT NAME:  Keith Cruz, Keith Cruz MR#:  [id number] DATE OF BIRTH:  Oct 29, 1940  DATE OF ADMISSION:  10/01/2013 DATE OF DISCHARGE:  10/05/2013  ADMITTING PHYSICIAN:  Shreyang H. Posey Pronto, MD  TRANSFERRING PHYSICIAN:  Gladstone Lighter, MD  PRIMARY CARE PHYSICIAN: Milinda Pointer. [place], [place]:  1.  Cardiology consultation by Corey Skains, MD. 2.  Nephrology consultation by Tama High, MD.  TRANSFER-ACCEPTING FACILITY:  [facility].   DISCHARGE DIAGNOSES: 1.  Non-ST segment elevation myocardial infarction.  2.  Coronary artery disease, status post 3-vessel disease, awaiting bypass graft surgery.  3.  Hypertension.  4.  Bilateral carotid stenosis, status post intervention in the past.  5.  Right renal artery stenosis, status post stent. 6.  Chronic kidney disease stage 3, baseline creatinine around 1.4 to 1.5.   DISCHARGE HOME MEDICATIONS:  1.  Folic acid 0.8 mg p.o. daily.  2.  Vitamin B12 1000 mcg p.o. daily.  3.  Simvastatin 40 mg p.o. daily.  4.  Protonix 40 mg p.o. daily.  5.  Xanax 0.25 mg orally once a day as needed.  6.  Fexofenadine 180 mg p.o. daily.  7.  Centrum Silver 1 tablet p.o. daily.  8.  Aspirin 81 mg p.o. daily.  9.  Vitamin D3, 2000 international units p.o. daily.  10.  Metoprolol 50 mg p.o. b.i.d.  11.  Hydralazine 25 mg p.o. b.i.d.  12.  Nitroglycerin 2 inches topically twice a day.   13.  Nitroglycerin 0.4 mg sublingual as needed for chest pain.  14.  Lovenox 40 mg subcutaneously once a day.  15.  Plavix is currently on hold.   DISCHARGE DIET: Low-sodium, low-fat diet.   DISCHARGE ACTIVITY: As tolerated.    FOLLOWUP INSTRUCTIONS: Patient is being transferred to [facility] for bypass graft surgery.   LABORATORY, DIAGNOSTIC AND RADIOLOGICAL DATA PRIOR TO DISCHARGE:   1.  Sodium 140, potassium 4.0, chloride 106, bicarbonate 30, BUN 10, creatinine 1.49, glucose 108 and calcium of 8.6.  2.  WBC is 10.5, hemoglobin 13.7, hematocrit  41.9, platelet count 331.  3.  Echocardiogram showing normal LV ejection fraction, ejection fraction of 50% to 50%, impaired relaxation pattern of LV diastolic filling, mild left ventricular hypertrophy noted.  4.  Cardiac catheterization done on October 03, 2013, showing normal LV function, ejection fraction of 55%, critical 3-vessel coronary artery disease, mild aortic atherosclerosis. The patient had 95% stenosis in proximal LAD, ostial LAD is 85% stenosis, first diagonal is 75% stenosis, proximal RCA 85% stenosis, mid RCA 90% stenosis, right PDA 60% stenosis and right posterolateral segment there is 50% stenosis.  Recommendation is bypass graft surgery.  5.  The patient's troponins were elevated on admission as high as 5.2, second set was 3.90, first set was 0.08.  6.  Chest x-ray showing left basilar atelectasis, otherwise clear lung fields, heart is slightly enlarged.  7.  His HbA1c 6.6.  8.  LDL cholesterol 75, HDL 35, triglycerides 114, total cholesterol 133.   BRIEF HOSPITAL COURSE: Keith Cruz is a 74 year old Caucasian male with past medical history significant for hypertension, peripheral vascular disease with bilateral carotid stenosis surgery and right renal artery stenosis stent placement, history of paroxysmal atrial fibrillation and cerebrovascular accident in the past and anxiety, presents to the hospital secondary to chest pain.  1.  Non-ST segment elevation myocardial infarction. The patient has been experiencing unstable angina for almost a year but admitted at this time with more pronounced and nonrevealing.  He  had a mild troponin elevation of 0.08 on admission that went up to 5.2. He was started on heparin drip, seen by cardiologist and had a cardiac catheterization done which showed 3-vessel critical stenosis. Bypass graft surgery is recommended and the patient is being transferred over for bypass surgery. The patient is on aspirin. His last dose of Plavix was on October 02, 2013. He is also  on statin, metoprolol at this time. He quit smoking a ng time ago.  2.  Chronic kidney disease stage 3 at baseline, baseline creatinine of 1.4 to 1.5. Seen by nephrology, especially in requiring the cardiac catheterization and dye exposure. The patient's kidney function had remained stable. Nephrology has followed  the patient while in the hospital.  3.  Hypertension. The patient on metoprolol and hydralazine.  4.  Bilateral carotid artery stenosis. The patient is status post carotid endarterectomy with his history of cerebrovascular accident.   5.  Right renal artery stenosis, status post stent placement.  6.  Paroxysmal atrial fibrillation, not sure if diagnosed with that, not on any anticoagulation at this time.    DISCHARGE CONDITION: Stable.   DISCHARGE DISPOSITION: To [facility].   TIME SPENT ON DISCHARGE: 45 minutes.   ____________________________ Gladstone Lighter, MD rk:cs D: 10/05/2013 18:14:23 ET T: 10/05/2013 18:42:10 ET JOB#: [id number]  cc: Gladstone Lighter, MD, <Dictator> Gladstone Lighter MD ELECTRONICALLY SIGNED 10/08/2013 15:43

## 2014-08-19 NOTE — Consult Note (Signed)
PATIENT NAME:  Keith Cruz, Keith Cruz MR#:  [id number] DATE OF BIRTH:  1940-05-13  DATE OF CONSULTATION:  10/01/2013  REFERRING PHYSICIAN:  Demetrios Loll, MD CONSULTING PHYSICIAN:  Corey Skains, MD  REASON FOR CONSULTATION: Acute subendocardial myocardial infarction, peripheral vascular disease, cerebrovascular accident, chronic kidney disease, and hypertension.   CHIEF COMPLAINT: "I had chest pain."   HISTORY OF PRESENT ILLNESS: This is a 74 year old male with peripheral vascular disease, status post renal artery stenosis and stenting, who has had some claudication-type symptoms in the past and has had some shortness of breath with physical activity and mild chest discomfort waxing and waning over the last 6 to 8 months, but significantly causing chest pain that did not go away over the last several hours. The patient has had some other issues, with an EKG showing lateral ST depression consistent with subendocardial myocardial ischemia with an elevated troponin of 0.08. The patient also has chronic kidney disease with a creatinine of 1.4 and hypertension on appropriate medication management. The patient has had other concerns with physical activity levels with severe shortness of breath, weakness and fatigue, multifactorial in nature. This includes the possibility of chronic obstructive pulmonary disease with some tobacco abuse.  REVIEW OF SYSTEMS: Remainder is negative for vision change, ringing in the ears, hearing loss, cough, congestion, heartburn, nausea, vomiting, diarrhea, bloody stools, stomach pain, extremity pain, leg weakness, cramping of the buttocks, known blood clots, headaches,  blackouts, dizzy spells, nosebleeds, congestion, trouble swallowing, frequent urination, urination at night, muscle weakness, numbness, anxiety, depression, skin lesions, skin rashes.   PAST MEDICAL HISTORY: 1.  Peripheral vascular disease.  2.  Hypertension.  3.  Hyperlipidemia.  4.  Chronic obstructive pulmonary  disease.   FAMILY HISTORY: Multiple family members with early onset of cardiovascular disease and hypertension.   SOCIAL HISTORY: He currently says he does smoke on occasion. Denies alcohol use.   ALLERGIES: AS LISTED.   MEDICATIONS: As listed.   PHYSICAL EXAMINATION: VITAL SIGNS: Blood pressure is 122/68 bilaterally. Heart rate 72 upright, reclining, and regular.  GENERAL: He is a well-appearing male in no acute distress.  HEAD, EYES, EARS, NOSE, THROAT:  No icterus, thyromegaly, ulcers, hemorrhage, or xanthelasma.  CARDIOVASCULAR: Regular rate and rhythm. Normal S1 and S2 without murmur, gallop, or rub. PMI is inferiorly displaced. Carotid upstroke normal with bruit.  LUNGS: Diffuse wheezes.  ABDOMEN: Soft, nontender, without hepatosplenomegaly or masses, with abdominal bruit.  EXTREMITIES: Show 2+ radial, trace dorsal pedal, and 1+ femoral artery changes without evidence of lower extremity edema, cyanosis, clubbing, or ulcers.  NEUROLOGIC: He is oriented to time, place, and person, with normal mood and affect.   ASSESSMENT: This is a 74 year old male with hypertension, hyperlipidemia, peripheral vascular disease, chronic obstructive pulmonary disease with substernal chest pain, EKG changes, elevated troponin consistent with minimal subendocardial myocardial infarction, needing further medical management.   RECOMMENDATIONS: 1.  Nitrates, aspirin, beta blocker for coronary artery disease and myocardial infarction.  2.  Oxygen and heparin.  3.  Proceed to cardiac catheterization to assess coronary anatomy and further treatment thereof as necessary. The patient understands the risks and benefits of cardiac catheterization. This includes the possibility of death, stroke, heart attack, infection, bleeding or blood clot. He is at low risk for conscious sedation.  4.  Echocardiogram for LV systolic dysfunction and valvular heart disease.  5.  Hypertension control as per above.  6.  Further  diagnostic testing and treatment options after above.    ____________________________ Corey Skains, MD  bjk:jcm D: 10/01/2013 15:21:48 ET T: 10/01/2013 20:39:57 ET JOB#: [id number]  cc: Corey Skains, MD, <Dictator> Corey Skains MD ELECTRONICALLY SIGNED 10/04/2013 14:46

## 2014-08-31 DIAGNOSIS — H65111 Acute and subacute allergic otitis media (mucoid) (sanguinous) (serous), right ear: Secondary | ICD-10-CM | POA: Diagnosis not present

## 2014-08-31 DIAGNOSIS — R0989 Other specified symptoms and signs involving the circulatory and respiratory systems: Secondary | ICD-10-CM | POA: Diagnosis not present

## 2014-08-31 DIAGNOSIS — F419 Anxiety disorder, unspecified: Secondary | ICD-10-CM | POA: Diagnosis not present

## 2014-09-05 DIAGNOSIS — I10 Essential (primary) hypertension: Secondary | ICD-10-CM | POA: Diagnosis not present

## 2014-09-05 DIAGNOSIS — H65111 Acute and subacute allergic otitis media (mucoid) (sanguinous) (serous), right ear: Secondary | ICD-10-CM | POA: Diagnosis not present

## 2014-09-05 DIAGNOSIS — H9201 Otalgia, right ear: Secondary | ICD-10-CM | POA: Diagnosis not present

## 2014-09-22 DIAGNOSIS — N289 Disorder of kidney and ureter, unspecified: Secondary | ICD-10-CM | POA: Diagnosis not present

## 2014-09-22 DIAGNOSIS — J302 Other seasonal allergic rhinitis: Secondary | ICD-10-CM | POA: Diagnosis not present

## 2014-09-22 DIAGNOSIS — R7309 Other abnormal glucose: Secondary | ICD-10-CM | POA: Diagnosis not present

## 2014-09-22 DIAGNOSIS — I10 Essential (primary) hypertension: Secondary | ICD-10-CM | POA: Diagnosis not present

## 2014-09-22 DIAGNOSIS — E785 Hyperlipidemia, unspecified: Secondary | ICD-10-CM | POA: Diagnosis not present

## 2014-09-22 DIAGNOSIS — K219 Gastro-esophageal reflux disease without esophagitis: Secondary | ICD-10-CM | POA: Diagnosis not present

## 2014-09-28 ENCOUNTER — Other Ambulatory Visit: Payer: Self-pay | Admitting: Family Medicine

## 2014-09-28 ENCOUNTER — Telehealth: Payer: Self-pay | Admitting: Family Medicine

## 2014-09-28 ENCOUNTER — Other Ambulatory Visit: Payer: Self-pay

## 2014-09-28 DIAGNOSIS — E785 Hyperlipidemia, unspecified: Secondary | ICD-10-CM

## 2014-09-28 NOTE — Telephone Encounter (Signed)
Patient will pick up new order.[facility]

## 2014-09-28 NOTE — Progress Notes (Unsigned)
Advised patient and they will follow up as needed. Will stop fruits and pick yp lab order for BMP in 2 weeks.[facility], [place]    This encounter was created in error - please disregard.

## 2014-09-28 NOTE — Telephone Encounter (Signed)
Pt needs a lab order.  He was to recheck labs in 2 weeks.  Please call when ready

## 2014-09-28 NOTE — Telephone Encounter (Signed)
Patient need an order to recheck labs from 2 weeks ago.

## 2014-10-10 DIAGNOSIS — I10 Essential (primary) hypertension: Secondary | ICD-10-CM | POA: Diagnosis not present

## 2014-10-10 DIAGNOSIS — E785 Hyperlipidemia, unspecified: Secondary | ICD-10-CM | POA: Diagnosis not present

## 2014-10-10 DIAGNOSIS — I701 Atherosclerosis of renal artery: Secondary | ICD-10-CM | POA: Diagnosis not present

## 2014-10-10 DIAGNOSIS — I6529 Occlusion and stenosis of unspecified carotid artery: Secondary | ICD-10-CM | POA: Diagnosis not present

## 2014-10-26 ENCOUNTER — Telehealth: Payer: Self-pay

## 2014-10-26 ENCOUNTER — Other Ambulatory Visit: Payer: Self-pay

## 2014-10-26 DIAGNOSIS — R7309 Other abnormal glucose: Secondary | ICD-10-CM

## 2014-10-26 NOTE — Telephone Encounter (Signed)
Ok-jh 

## 2014-10-26 NOTE — Telephone Encounter (Signed)
Patient came by for lab order from a lab elevated 2 weeks ago. I could not see results and called [facility] they said A1C was done 5/27 so I reordered CMP as patient stated that this lab was due to elevated glucose. He kept saying it was A1C but we just did one. Can not get into [facility] to verify. I hope this is right.

## 2014-10-27 LAB — BASIC METABOLIC PANEL
BUN/Creatinine Ratio: 13 (ref 10–22)
BUN: 19 mg/dL (ref 8–27)
CO2: 24 mmol/L (ref 18–29)
Calcium: 9.5 mg/dL (ref 8.6–10.2)
Chloride: 101 mmol/L (ref 97–108)
Creatinine, Ser: 1.44 mg/dL — ABNORMAL HIGH (ref 0.76–1.27)
GFR calc Af Amer: 55 mL/min/{1.73_m2} — ABNORMAL LOW (ref >59)
GFR calc non Af Amer: 47 mL/min/{1.73_m2} — ABNORMAL LOW (ref >59)
Glucose: 115 mg/dL — ABNORMAL HIGH (ref 65–99)
Potassium: 4.9 mmol/L (ref 3.5–5.2)
Sodium: 141 mmol/L (ref 134–144)

## 2014-10-27 NOTE — Progress Notes (Signed)
Mailed Results.

## 2014-12-11 DIAGNOSIS — L309 Dermatitis, unspecified: Secondary | ICD-10-CM | POA: Diagnosis not present

## 2014-12-11 DIAGNOSIS — L570 Actinic keratosis: Secondary | ICD-10-CM | POA: Diagnosis not present

## 2014-12-11 DIAGNOSIS — D492 Neoplasm of unspecified behavior of bone, soft tissue, and skin: Secondary | ICD-10-CM | POA: Diagnosis not present

## 2014-12-11 DIAGNOSIS — L821 Other seborrheic keratosis: Secondary | ICD-10-CM | POA: Diagnosis not present

## 2014-12-11 DIAGNOSIS — L299 Pruritus, unspecified: Secondary | ICD-10-CM | POA: Diagnosis not present

## 2014-12-28 DIAGNOSIS — I10 Essential (primary) hypertension: Secondary | ICD-10-CM | POA: Insufficient documentation

## 2015-01-11 DIAGNOSIS — E782 Mixed hyperlipidemia: Secondary | ICD-10-CM | POA: Diagnosis not present

## 2015-01-11 DIAGNOSIS — R0789 Other chest pain: Secondary | ICD-10-CM | POA: Diagnosis not present

## 2015-01-11 DIAGNOSIS — I25810 Atherosclerosis of coronary artery bypass graft(s) without angina pectoris: Secondary | ICD-10-CM | POA: Diagnosis not present

## 2015-01-11 DIAGNOSIS — I25118 Atherosclerotic heart disease of native coronary artery with other forms of angina pectoris: Secondary | ICD-10-CM | POA: Diagnosis not present

## 2015-01-22 ENCOUNTER — Ambulatory Visit (INDEPENDENT_AMBULATORY_CARE_PROVIDER_SITE_OTHER): Payer: Medicare Other | Admitting: Family Medicine

## 2015-01-22 ENCOUNTER — Encounter: Payer: Self-pay | Admitting: Family Medicine

## 2015-01-22 VITALS — BP 129/70 | HR 55 | Temp 98.0°F | Resp 16 | Ht 74.0 in | Wt 223.0 lb

## 2015-01-22 DIAGNOSIS — F32A Depression, unspecified: Secondary | ICD-10-CM

## 2015-01-22 DIAGNOSIS — G4700 Insomnia, unspecified: Secondary | ICD-10-CM | POA: Insufficient documentation

## 2015-01-22 DIAGNOSIS — K219 Gastro-esophageal reflux disease without esophagitis: Secondary | ICD-10-CM

## 2015-01-22 DIAGNOSIS — F329 Major depressive disorder, single episode, unspecified: Secondary | ICD-10-CM | POA: Diagnosis not present

## 2015-01-22 DIAGNOSIS — I2584 Coronary atherosclerosis due to calcified coronary lesion: Secondary | ICD-10-CM

## 2015-01-22 DIAGNOSIS — N289 Disorder of kidney and ureter, unspecified: Secondary | ICD-10-CM | POA: Insufficient documentation

## 2015-01-22 DIAGNOSIS — R7309 Other abnormal glucose: Secondary | ICD-10-CM | POA: Diagnosis not present

## 2015-01-22 DIAGNOSIS — E7800 Pure hypercholesterolemia, unspecified: Secondary | ICD-10-CM

## 2015-01-22 DIAGNOSIS — I2510 Atherosclerotic heart disease of native coronary artery without angina pectoris: Secondary | ICD-10-CM

## 2015-01-22 DIAGNOSIS — Z23 Encounter for immunization: Secondary | ICD-10-CM

## 2015-01-22 DIAGNOSIS — R7303 Prediabetes: Secondary | ICD-10-CM

## 2015-01-22 MED ORDER — SERTRALINE HCL 50 MG PO TABS: 50.0000 mg | ORAL_TABLET | Freq: Every day | ORAL | Status: DC

## 2015-01-22 NOTE — Patient Instructions (Signed)
Start with 1/2 tablet of Zoloft x 6 days before increasing to 1 tablet a day.

## 2015-01-22 NOTE — Progress Notes (Signed)
Name: Keith Cruz   MRN: [id number]    DOB: 1940-09-10   Date:01/22/2015       Progress Note  Subjective  Chief Complaint  Chief Complaint  Patient presents with  . Hypertension    HPI  Here for f/u of pre-diabetes, elevated chol., GERD, ASCVD with 3 vessel bypass , renal stent for obstruction, hyperliopidemia.  C/o difficulty sleeping.  Trouble staying asleep.  Hx. Of "panic attacks" in past.   No problem-specific assessment & plan notes found for this encounter.   Past Medical History  Diagnosis Date  . Hypertension   . Kidney disease   . Seasonal allergies     Social History  Substance Use Topics  . Smoking status: Current Some Day Smoker -- 2.00 packs/day for 10 years    Types: Cigarettes  . Smokeless tobacco: Never Used  . Alcohol Use: No     Comment: previous     Current outpatient prescriptions:  .  Cholecalciferol (VITAMIN D3) 2000 UNITS capsule, Take 2,000 Units by mouth daily., Disp: , Rfl:  .  clobetasol (TEMOVATE) 0.05 % external solution, Apply 2 application topically as needed., Disp: , Rfl:  .  clopidogrel (PLAVIX) 75 MG tablet, Take 75 mg by mouth daily., Disp: , Rfl:  .  Cyanocobalamin (RA VITAMIN B-12 TR) 1000 MCG TBCR, Take 1,000 mcg by mouth daily., Disp: , Rfl:  .  fexofenadine (ALLEGRA) 180 MG tablet, Take 180 mg by mouth daily., Disp: , Rfl:  .  folic acid (FOLVITE) 1 MG tablet, Take 0.8 mg by mouth daily., Disp: , Rfl:  .  LYCOPENE PO, Take 1 tablet by mouth daily., Disp: , Rfl:  .  metoprolol (LOPRESSOR) 50 MG tablet, Take 50 mg by mouth daily., Disp: , Rfl:  .  pantoprazole (PROTONIX) 40 MG tablet, Take 40 mg by mouth daily., Disp: , Rfl:  .  simvastatin (ZOCOR) 40 MG tablet, Take 40 mg by mouth daily., Disp: , Rfl:  .  triamcinolone cream (KENALOG) 0.1 %, Apply 1 application topically as needed., Disp: , Rfl:   Allergies  Allergen Reactions  . Fexofenadine-Pseudoephed Er Swelling    Tongue and face. Only with D product. Tolerates plain  allegra at home.   . Nifedipine Swelling    Tongue and face Tongue and face  . Allegra  [Fexofenadine] Swelling    Tongue and face. Only with D product. Tolerates plain allegra at home.   . Prednisone     Other reaction(s): Other (See Comments) Mental status changes Other reaction(s): Other (See Comments) Mental status changes    Review of Systems  Constitutional: Negative for fever, chills, weight loss and malaise/fatigue.  HENT: Negative for hearing loss.   Eyes: Negative for blurred vision and double vision.  Respiratory: Positive for cough. Negative for sputum production, shortness of breath and wheezing.   Cardiovascular: Negative for chest pain, palpitations, orthopnea and leg swelling.  Gastrointestinal: Negative for heartburn, abdominal pain and blood in stool.  Genitourinary: Negative for dysuria, urgency and frequency.  Musculoskeletal: Negative for myalgias and joint pain.  Neurological: Negative for dizziness, sensory change, focal weakness, seizures, weakness and headaches.  Psychiatric/Behavioral: Positive for depression. The patient is nervous/anxious and has insomnia.       Objective  Filed Vitals:   01/22/15 0827  BP: 129/70  Pulse: 55  Temp: 98 F (36.7 C)  TempSrc: Oral  Resp: 16  Height: 6\' 2"  (1.88 m)  Weight: 223 lb (101.152 kg)     Physical Exam  Constitutional: He is oriented to person, place, and time and well-developed, well-nourished, and in no distress. No distress.  HENT:  Head: Normocephalic and atraumatic.  Eyes: Conjunctivae and EOM are normal. Pupils are equal, round, and reactive to light. No scleral icterus.  Neck: Normal range of motion. Neck supple. Carotid bruit is not present. No thyromegaly present.  Cardiovascular: Normal rate, regular rhythm, normal heart sounds and intact distal pulses.  Exam reveals no gallop and no friction rub.   No murmur heard. Pulmonary/Chest: Effort normal and breath sounds normal. No respiratory  distress. He has no wheezes. He has no rales.  Abdominal: Soft. Bowel sounds are normal. He exhibits no distension, no abdominal bruit and no mass. There is no tenderness.  Musculoskeletal: Normal range of motion. He exhibits no edema.  Lymphadenopathy:    He has no cervical adenopathy.  Neurological: He is alert and oriented to person, place, and time.  Psychiatric: Mood, memory, affect and judgment normal.  c/o insomnia with depressive pattern.  Vitals reviewed.     Recent Results (from the past 2160 hour(s))  Basic Metabolic Panel (BMET)     Status: Abnormal   Collection Time: 10/26/14  8:55 AM  Result Value Ref Range   Glucose 115 (H) 65 - 99 mg/dL   BUN 19 8 - 27 mg/dL   Creatinine, Ser 1.44 (H) 0.76 - 1.27 mg/dL   GFR calc non Af Amer 47 (L) >59 mL/min/1.73   GFR calc Af Amer 55 (L) >59 mL/min/1.73   BUN/Creatinine Ratio 13 10 - 22   Sodium 141 134 - 144 mmol/L   Potassium 4.9 3.5 - 5.2 mmol/L   Chloride 101 97 - 108 mmol/L   CO2 24 18 - 29 mmol/L   Calcium 9.5 8.6 - 10.2 mg/dL     Assessment & Plan  1. Elevated cholesterol  - Comprehensive Metabolic Panel (CMET) - Lipid Profile  2. Coronary artery disease due to calcified coronary lesion   3. Gastroesophageal reflux disease without esophagitis  - CBC with Differential  4. Renal insufficiency   5. Pre-diabetes  - HgB A1c  6. Depression  - TSH  7. Insomnia  - sertraline (ZOLOFT) 50 MG tablet; Take 1 tablet (50 mg total) by mouth daily.  Dispense: 30 tablet; Refill: 3  8. Need for influenza vaccination  - Flu vaccine HIGH DOSE PF (Fluzone High dose)

## 2015-01-23 ENCOUNTER — Encounter: Payer: Self-pay | Admitting: Family Medicine

## 2015-01-23 LAB — COMPREHENSIVE METABOLIC PANEL
ALT: 21 IU/L (ref 0–44)
AST: 23 IU/L (ref 0–40)
Albumin/Globulin Ratio: 1.5 (ref 1.1–2.5)
Albumin: 4.5 g/dL (ref 3.5–4.8)
Alkaline Phosphatase: 64 IU/L (ref 39–117)
BUN/Creatinine Ratio: 14 (ref 10–22)
BUN: 23 mg/dL (ref 8–27)
Bilirubin Total: 0.6 mg/dL (ref 0.0–1.2)
CO2: 23 mmol/L (ref 18–29)
Calcium: 9.6 mg/dL (ref 8.6–10.2)
Chloride: 100 mmol/L (ref 97–108)
Creatinine, Ser: 1.63 mg/dL — ABNORMAL HIGH (ref 0.76–1.27)
GFR calc Af Amer: 47 mL/min/{1.73_m2} — ABNORMAL LOW (ref >59)
GFR calc non Af Amer: 41 mL/min/{1.73_m2} — ABNORMAL LOW (ref >59)
Globulin, Total: 3.1 g/dL (ref 1.5–4.5)
Glucose: 120 mg/dL — ABNORMAL HIGH (ref 65–99)
Potassium: 5.7 mmol/L — ABNORMAL HIGH (ref 3.5–5.2)
Sodium: 142 mmol/L (ref 134–144)
Total Protein: 7.6 g/dL (ref 6.0–8.5)

## 2015-01-23 LAB — CBC WITH DIFFERENTIAL/PLATELET
Basophils Absolute: 0 10*3/uL (ref 0.0–0.2)
Basos: 0 %
EOS (ABSOLUTE): 0.2 10*3/uL (ref 0.0–0.4)
Eos: 3 %
Hematocrit: 46 % (ref 37.5–51.0)
Hemoglobin: 15.6 g/dL (ref 12.6–17.7)
Immature Grans (Abs): 0 10*3/uL (ref 0.0–0.1)
Immature Granulocytes: 0 %
Lymphocytes Absolute: 1.8 10*3/uL (ref 0.7–3.1)
Lymphs: 23 %
MCH: 33.5 pg — ABNORMAL HIGH (ref 26.6–33.0)
MCHC: 33.9 g/dL (ref 31.5–35.7)
MCV: 99 fL — ABNORMAL HIGH (ref 79–97)
Monocytes Absolute: 0.9 10*3/uL (ref 0.1–0.9)
Monocytes: 11 %
Neutrophils Absolute: 5 10*3/uL (ref 1.4–7.0)
Neutrophils: 63 %
Platelets: 300 10*3/uL (ref 150–379)
RBC: 4.65 x10E6/uL (ref 4.14–5.80)
RDW: 13.2 % (ref 12.3–15.4)
WBC: 7.9 10*3/uL (ref 3.4–10.8)

## 2015-01-23 LAB — HEMOGLOBIN A1C
Est. average glucose Bld gHb Est-mCnc: 143 mg/dL
Hgb A1c MFr Bld: 6.6 % — ABNORMAL HIGH (ref 4.8–5.6)

## 2015-01-23 LAB — LIPID PANEL
Chol/HDL Ratio: 3.9 ratio units (ref 0.0–5.0)
Cholesterol, Total: 159 mg/dL (ref 100–199)
HDL: 41 mg/dL (ref >39)
LDL Calculated: 93 mg/dL (ref 0–99)
Triglycerides: 127 mg/dL (ref 0–149)
VLDL Cholesterol Cal: 25 mg/dL (ref 5–40)

## 2015-01-23 LAB — TSH: TSH: 2.38 u[IU]/mL (ref 0.450–4.500)

## 2015-01-24 ENCOUNTER — Other Ambulatory Visit: Payer: Self-pay

## 2015-01-24 DIAGNOSIS — E875 Hyperkalemia: Secondary | ICD-10-CM

## 2015-01-24 MED ORDER — FUROSEMIDE 20 MG PO TABS: 20.0000 mg | ORAL_TABLET | Freq: Every day | ORAL | Status: DC

## 2015-01-24 NOTE — Progress Notes (Signed)
See NEW NOTE

## 2015-01-31 DIAGNOSIS — I701 Atherosclerosis of renal artery: Secondary | ICD-10-CM | POA: Diagnosis not present

## 2015-01-31 DIAGNOSIS — E875 Hyperkalemia: Secondary | ICD-10-CM | POA: Diagnosis not present

## 2015-01-31 DIAGNOSIS — N183 Chronic kidney disease, stage 3 (moderate): Secondary | ICD-10-CM | POA: Diagnosis not present

## 2015-01-31 DIAGNOSIS — I10 Essential (primary) hypertension: Secondary | ICD-10-CM | POA: Diagnosis not present

## 2015-02-03 ENCOUNTER — Ambulatory Visit: Payer: Medicare Other

## 2015-02-03 ENCOUNTER — Ambulatory Visit
Admission: EM | Admit: 2015-02-03 | Discharge: 2015-02-03 | Disposition: A | Discharge status: Home / Self Care | Payer: Medicare Other | Attending: Family Medicine | Admitting: Family Medicine

## 2015-02-03 ENCOUNTER — Encounter: Payer: Self-pay | Admitting: Gynecology

## 2015-02-03 DIAGNOSIS — H6593 Unspecified nonsuppurative otitis media, bilateral: Secondary | ICD-10-CM | POA: Diagnosis not present

## 2015-02-03 DIAGNOSIS — J302 Other seasonal allergic rhinitis: Secondary | ICD-10-CM

## 2015-02-03 DIAGNOSIS — J209 Acute bronchitis, unspecified: Secondary | ICD-10-CM

## 2015-02-03 DIAGNOSIS — L309 Dermatitis, unspecified: Secondary | ICD-10-CM | POA: Diagnosis not present

## 2015-02-03 MED ORDER — ALBUTEROL SULFATE HFA 108 (90 BASE) MCG/ACT IN AERS: 1.0000 | INHALATION_SPRAY | RESPIRATORY_TRACT | Status: DC | PRN

## 2015-02-03 MED ORDER — BENZONATATE 200 MG PO CAPS: 200.0000 mg | ORAL_CAPSULE | Freq: Three times a day (TID) | ORAL | Status: DC | PRN

## 2015-02-03 MED ORDER — SALINE SPRAY 0.65 % NA SOLN
2.0000 | NASAL | Status: DC
Start: 2015-02-03 — End: 2015-12-10

## 2015-02-03 MED ORDER — AMOXICILLIN-POT CLAVULANATE 875-125 MG PO TABS: 1.0000 | ORAL_TABLET | Freq: Two times a day (BID) | ORAL | Status: DC

## 2015-02-03 NOTE — Discharge Instructions (Signed)
Acute Bronchitis Bronchitis is inflammation of the airways that extend from the windpipe into the lungs (bronchi). The inflammation often causes mucus to develop. This leads to a cough, which is the most common symptom of bronchitis.  In acute bronchitis, the condition usually develops suddenly and goes away over time, usually in a couple weeks. Smoking, allergies, and asthma can make bronchitis worse. Repeated episodes of bronchitis may cause further lung problems.  CAUSES Acute bronchitis is most often caused by the same virus that causes a cold. The virus can spread from person to person (contagious) through coughing, sneezing, and touching contaminated objects. SIGNS AND SYMPTOMS   Cough.   Fever.   Coughing up mucus.   Body aches.   Chest congestion.   Chills.   Shortness of breath.   Sore throat.  DIAGNOSIS  Acute bronchitis is usually diagnosed through a physical exam. Your health care provider will also ask you questions about your medical history. Tests, such as chest X-rays, are sometimes done to rule out other conditions.  TREATMENT  Acute bronchitis usually goes away in a couple weeks. Oftentimes, no medical treatment is necessary. Medicines are sometimes given for relief of fever or cough. Antibiotic medicines are usually not needed but may be prescribed in certain situations. In some cases, an inhaler may be recommended to help reduce shortness of breath and control the cough. A cool mist vaporizer may also be used to help thin bronchial secretions and make it easier to clear the chest.  HOME CARE INSTRUCTIONS  Get plenty of rest.   Drink enough fluids to keep your urine clear or pale yellow (unless you have a medical condition that requires fluid restriction). Increasing fluids may help thin your respiratory secretions (sputum) and reduce chest congestion, and it will prevent dehydration.   Take medicines only as directed by your health care provider.  If  you were prescribed an antibiotic medicine, finish it all even if you start to feel better.  Avoid smoking and secondhand smoke. Exposure to cigarette smoke or irritating chemicals will make bronchitis worse. If you are a smoker, consider using nicotine gum or skin patches to help control withdrawal symptoms. Quitting smoking will help your lungs heal faster.   Reduce the chances of another bout of acute bronchitis by washing your hands frequently, avoiding people with cold symptoms, and trying not to touch your hands to your mouth, nose, or eyes.   Keep all follow-up visits as directed by your health care provider.  SEEK MEDICAL CARE IF: Your symptoms do not improve after 1 week of treatment.  SEEK IMMEDIATE MEDICAL CARE IF:  You develop an increased fever or chills.   You have chest pain.   You have severe shortness of breath.  You have bloody sputum.   You develop dehydration.  You faint or repeatedly feel like you are going to pass out.  You develop repeated vomiting.  You develop a severe headache. MAKE SURE YOU:   Understand these instructions.  Will watch your condition.  Will get help right away if you are not doing well or get worse.   This information is not intended to replace advice given to you by your health care provider. Make sure you discuss any questions you have with your health care provider.   Document Released: 05/22/2004 Document Revised: 05/05/2014 Document Reviewed: 10/05/2012 Elsevier Interactive Patient Education 2016 [employer].  Allergic Rhinitis Allergic rhinitis is when the mucous membranes in the nose respond to allergens. Allergens are particles  in the air that cause your body to have an allergic reaction. This causes you to release allergic antibodies. Through a chain of events, these eventually cause you to release histamine into the blood stream. Although meant to protect the body, it is this release of histamine that causes your  discomfort, such as frequent sneezing, congestion, and an itchy, runny nose.  CAUSES Seasonal allergic rhinitis (hay fever) is caused by pollen allergens that may come from grasses, trees, and weeds. Year-round allergic rhinitis (perennial allergic rhinitis) is caused by allergens such as house dust mites, pet dander, and mold spores. SYMPTOMS  Nasal stuffiness (congestion).  Itchy, runny nose with sneezing and tearing of the eyes. DIAGNOSIS Your health care provider can help you determine the allergen or allergens that trigger your symptoms. If you and your health care provider are unable to determine the allergen, skin or blood testing may be used. Your health care provider will diagnose your condition after taking your health history and performing a physical exam. Your health care provider may assess you for other related conditions, such as asthma, pink eye, or an ear infection. TREATMENT Allergic rhinitis does not have a cure, but it can be controlled by:  Medicines that block allergy symptoms. These may include allergy shots, nasal sprays, and oral antihistamines.  Avoiding the allergen. Hay fever may often be treated with antihistamines in pill or nasal spray forms. Antihistamines block the effects of histamine. There are over-the-counter medicines that may help with nasal congestion and swelling around the eyes. Check with your health care provider before taking or giving this medicine. If avoiding the allergen or the medicine prescribed do not work, there are many new medicines your health care provider can prescribe. Stronger medicine may be used if initial measures are ineffective. Desensitizing injections can be used if medicine and avoidance does not work. Desensitization is when a patient is given ongoing shots until the body becomes less sensitive to the allergen. Make sure you follow up with your health care provider if problems continue. HOME CARE INSTRUCTIONS It is not possible  to completely avoid allergens, but you can reduce your symptoms by taking steps to limit your exposure to them. It helps to know exactly what you are allergic to so that you can avoid your specific triggers. SEEK MEDICAL CARE IF:  You have a fever.  You develop a cough that does not stop easily (persistent).  You have shortness of breath.  You start wheezing.  Symptoms interfere with normal daily activities.   This information is not intended to replace advice given to you by your health care provider. Make sure you discuss any questions you have with your health care provider.   Document Released: 01/07/2001 Document Revised: 05/05/2014 Document Reviewed: 12/20/2012 Elsevier Interactive Patient Education 2016 [place]. Otitis Media With Effusion Otitis media with effusion is the presence of fluid in the middle ear. This is a common problem in children, which often follows ear infections. It may be present for weeks or longer after the infection. Unlike an acute ear infection, otitis media with effusion refers only to fluid behind the ear drum and not infection. Children with repeated ear and sinus infections and allergy problems are the most likely to get otitis media with effusion. CAUSES  The most frequent cause of the fluid buildup is dysfunction of the eustachian tubes. These are the tubes that drain fluid in the ears to the back of the nose (nasopharynx). SYMPTOMS   The main symptom of  this condition is hearing loss. As a result, you or your child may:  Listen to the TV at a loud volume.  Not respond to questions.  Ask "what" often when spoken to.  Mistake or confuse one sound or word for another.  There may be a sensation of fullness or pressure but usually not pain. DIAGNOSIS   Your health care provider will diagnose this condition by examining you or your child's ears.  Your health care provider may test the pressure in you or your child's ear with a  tympanometer.  A hearing test may be conducted if the problem persists. TREATMENT   Treatment depends on the duration and the effects of the effusion.  Antibiotics, decongestants, nose drops, and cortisone-type drugs (tablets or nasal spray) may not be helpful.  Children with persistent ear effusions may have delayed language or behavioral problems. Children at risk for developmental delays in hearing, learning, and speech may require referral to a specialist earlier than children not at risk.  You or your child's health care provider may suggest a referral to an ear, nose, and throat surgeon for treatment. The following may help restore normal hearing:  Drainage of fluid.  Placement of ear tubes (tympanostomy tubes).  Removal of adenoids (adenoidectomy). HOME CARE INSTRUCTIONS   Avoid secondhand smoke.  Infants who are breastfed are less likely to have this condition.  Avoid feeding infants while they are lying flat.  Avoid known environmental allergens.  Avoid people who are sick. SEEK MEDICAL CARE IF:   Hearing is not better in 3 months.  Hearing is worse.  Ear pain.  Drainage from the ear.  Dizziness. MAKE SURE YOU:   Understand these instructions.  Will watch your condition.  Will get help right away if you are not doing well or get worse.   This information is not intended to replace advice given to you by your health care provider. Make sure you discuss any questions you have with your health care provider.   Document Released: 05/22/2004 Document Revised: 05/05/2014 Document Reviewed: 11/09/2012 Elsevier Interactive Patient Education [employer].

## 2015-02-03 NOTE — ED Notes (Signed)
Patient stated  Coughing / chest congestion / greenish / yellowish mucous / slight wheezing x 3 days.

## 2015-02-04 NOTE — ED Provider Notes (Signed)
CSN: [id number]     Arrival date & time 02/03/15  1122 History   First MD Initiated Contact with Patient 02/03/15 1140     Chief Complaint  Patient presents with  . Cough   (Consider location/radiation/quality/duration/timing/severity/associated sxs/prior Treatment) HPI Comments: Single caucasian male here for evaluation of body aches x 3 days, left ear pain, cough productive yellow and green, nasal congestion, chest congestion. Also would like me to look at rash on his scalp cream his doctor gave him doesn't seem to be helping bought tgel shampoo yesterday and used this am seems to feel better Denied others sick at home, retired.  Not wearing hearing aids Hx hearing loss  The history is provided by the patient.    Past Medical History  Diagnosis Date  . Hypertension   . Kidney disease   . Seasonal allergies    History reviewed. No pertinent past surgical history. Family History  Problem Relation Age of Onset  . Stroke Mother   . Heart attack Mother   . Stroke Father   . Stroke Paternal Grandmother   . Stroke Paternal Grandfather    Social History  Substance Use Topics  . Smoking status: Current Some Day Smoker -- 2.00 packs/day for 10 years    Types: Cigarettes  . Smokeless tobacco: Never Used  . Alcohol Use: No     Comment: previous    Review of Systems  Constitutional: Negative for fever, chills, diaphoresis, activity change, appetite change, fatigue and unexpected weight change.  HENT: Positive for congestion, ear pain, postnasal drip and rhinorrhea. Negative for dental problem, drooling, ear discharge, facial swelling, hearing loss, mouth sores, nosebleeds, sinus pressure, sneezing, sore throat, tinnitus, trouble swallowing and voice change.   Eyes: Negative for photophobia, pain, discharge, redness, itching and visual disturbance.  Respiratory: Positive for cough. Negative for choking, chest tightness, shortness of breath, wheezing and stridor.   Cardiovascular:  Negative for chest pain, palpitations and leg swelling.  Gastrointestinal: Negative for nausea, vomiting, abdominal pain, diarrhea, constipation, blood in stool and abdominal distention.  Endocrine: Negative for cold intolerance and heat intolerance.  Genitourinary: Negative for dysuria.  Musculoskeletal: Positive for myalgias. Negative for back pain, joint swelling, arthralgias, gait problem, neck pain and neck stiffness.  Skin: Positive for rash. Negative for color change, pallor and wound.  Allergic/Immunologic: Positive for environmental allergies. Negative for food allergies.  Neurological: Negative for dizziness, tremors, seizures, syncope, facial asymmetry, speech difficulty, weakness, light-headedness, numbness and headaches.  Hematological: Negative for adenopathy. Does not bruise/bleed easily.  Psychiatric/Behavioral: Negative for behavioral problems, confusion, sleep disturbance and agitation.    Allergies  Fexofenadine-pseudoephed er; Nifedipine; Allegra ; and Prednisone  Home Medications   Prior to Admission medications   Medication Sig Start Date End Date Taking? Authorizing Provider  Cholecalciferol (VITAMIN D3) 2000 UNITS capsule Take 2,000 Units by mouth daily.   Yes Historical Provider, MD  clobetasol (TEMOVATE) 0.05 % external solution Apply 2 application topically as needed. 12/11/14  Yes Historical Provider, MD  clopidogrel (PLAVIX) 75 MG tablet Take 75 mg by mouth daily. 06/16/14 06/16/15 Yes Historical Provider, MD  Cyanocobalamin (RA VITAMIN B-12 TR) 1000 MCG TBCR Take 1,000 mcg by mouth daily.   Yes Historical Provider, MD  fexofenadine (ALLEGRA) 180 MG tablet Take 180 mg by mouth daily.   Yes Historical Provider, MD  folic acid (FOLVITE) 1 MG tablet Take 0.8 mg by mouth daily.   Yes Historical Provider, MD  furosemide (LASIX) 20 MG tablet Take 1 tablet (20 mg  total) by mouth daily. 01/24/15  Yes Arlis Porta., MD  LYCOPENE PO Take 1 tablet by mouth daily.   Yes  Historical Provider, MD  metoprolol (LOPRESSOR) 50 MG tablet Take 50 mg by mouth daily. 09/22/14  Yes Historical Provider, MD  pantoprazole (PROTONIX) 40 MG tablet Take 40 mg by mouth daily.   Yes Historical Provider, MD  sertraline (ZOLOFT) 50 MG tablet Take 1 tablet (50 mg total) by mouth daily. 01/22/15  Yes Arlis Porta., MD  simvastatin (ZOCOR) 40 MG tablet Take 40 mg by mouth daily. 03/02/14 03/02/15 Yes Historical Provider, MD  triamcinolone cream (KENALOG) 0.1 % Apply 1 application topically as needed. 12/11/14  Yes Historical Provider, MD  albuterol (PROVENTIL HFA;VENTOLIN HFA) 108 (90 BASE) MCG/ACT inhaler Inhale 1-2 puffs into the lungs every 4 (four) hours as needed for wheezing or shortness of breath. 02/03/15   Olen Cordial, NP  amoxicillin-clavulanate (AUGMENTIN) 875-125 MG tablet Take 1 tablet by mouth every 12 (twelve) hours. 02/03/15   Olen Cordial, NP  benzonatate (TESSALON) 200 MG capsule Take 1 capsule (200 mg total) by mouth 3 (three) times daily as needed for cough. 02/03/15   Olen Cordial, NP  sodium chloride (OCEAN) 0.65 % SOLN nasal spray Place 2 sprays into both nostrils every 2 (two) hours while awake. 02/03/15   Olen Cordial, NP   Meds Ordered and Administered this Visit  Medications - No data to display  BP 124/72 mmHg  Pulse 64  Temp(Src) 98 F (36.7 C) (Oral)  Resp 18  Ht 6\' 4"  (1.93 m)  Wt 222 lb (100.699 kg)  BMI 27.03 kg/m2  SpO2 97% No data found.   Physical Exam  Constitutional: He is oriented to person, place, and time. Vital signs are normal. He appears well-developed and well-nourished. No distress.  HENT:  Head: Normocephalic and atraumatic.  Right Ear: External ear and ear canal normal. A middle ear effusion is present.  Left Ear: External ear and ear canal normal. A middle ear effusion is present.  Nose: Mucosal edema and rhinorrhea present. No nose lacerations, sinus tenderness, nasal deformity, septal deviation or nasal  septal hematoma. No epistaxis.  No foreign bodies. Right sinus exhibits no maxillary sinus tenderness and no frontal sinus tenderness. Left sinus exhibits no maxillary sinus tenderness and no frontal sinus tenderness.  Mouth/Throat: Uvula is midline and mucous membranes are normal. Mucous membranes are not pale, not dry and not cyanotic. He does not have dentures. No oral lesions. No trismus in the jaw. Normal dentition. No dental abscesses, uvula swelling, lacerations or dental caries. Posterior oropharyngeal edema and posterior oropharyngeal erythema present. No oropharyngeal exudate or tonsillar abscesses.  Cobblestoning posterior pharynx; bilateral nasal turbinates with edema/erythema clear discharge; bilateral TMs with air fluid level slight opacity  Eyes: Conjunctivae, EOM and lids are normal. Pupils are equal, round, and reactive to light. Right eye exhibits no chemosis, no discharge, no exudate and no hordeolum. No foreign body present in the right eye. Left eye exhibits no chemosis, no discharge, no exudate and no hordeolum. No foreign body present in the left eye. Right conjunctiva is not injected. Right conjunctiva has no hemorrhage. Left conjunctiva is not injected. Left conjunctiva has no hemorrhage. No scleral icterus. Right eye exhibits normal extraocular motion and no nystagmus. Left eye exhibits normal extraocular motion and no nystagmus. Right pupil is round and reactive. Left pupil is round and reactive. Pupils are equal.  Neck: Trachea normal and normal range  of motion. Neck supple. No spinous process tenderness and no muscular tenderness present. No rigidity. No tracheal deviation, no edema, no erythema and normal range of motion present.  Cardiovascular: Normal rate, regular rhythm, S1 normal, S2 normal, normal heart sounds and intact distal pulses.  PMI is not displaced.  Exam reveals no gallop and no friction rub.   No murmur heard. Pulmonary/Chest: Effort normal and breath sounds  normal. No stridor. No respiratory distress. He has no decreased breath sounds. He has no wheezes. He has no rhonchi. He has no rales. He exhibits no tenderness.  Rare cough nonproductive during evaluation; speaking in full sentences, negative egophany all fields  Abdominal: Soft. He exhibits no distension.  Musculoskeletal: He exhibits no edema or tenderness.       Right shoulder: Normal.       Left shoulder: He exhibits decreased range of motion and pain. He exhibits no tenderness, no bony tenderness, no swelling, no effusion, no crepitus, no deformity, no laceration, no spasm, normal pulse and normal strength.       Right elbow: Normal.      Left elbow: Normal.       Right hip: Normal.       Left hip: Normal.       Right knee: Normal.       Left knee: Normal.       Cervical back: Normal.       Thoracic back: Normal.       Lumbar back: Normal.       Right hand: Normal.       Left hand: Normal. Normal strength noted. He exhibits no finger abduction.  Pain left shoulder with AROM above shoulder level chronic greater than 1 year  Lymphadenopathy:       Head (right side): No submental, no submandibular, no tonsillar, no preauricular and no posterior auricular adenopathy present.       Head (left side): No submental, no submandibular, no tonsillar, no preauricular, no posterior auricular and no occipital adenopathy present.    He has no cervical adenopathy.       Right cervical: No superficial cervical, no deep cervical and no posterior cervical adenopathy present.      Left cervical: No superficial cervical, no deep cervical and no posterior cervical adenopathy present.  Neurological: He is alert and oriented to person, place, and time. He displays no atrophy and no tremor. No cranial nerve deficit or sensory deficit. He exhibits normal muscle tone. He displays no seizure activity. Coordination and gait normal. GCS eye subscore is 4. GCS verbal subscore is 5. GCS motor subscore is 6.  Skin:  Skin is warm, dry and intact. Rash noted. No abrasion, no bruising, no burn, no ecchymosis, no laceration, no lesion, no petechiae and no purpura noted. Rash is maculopapular. Rash is not macular, not papular, not nodular, not pustular, not vesicular and not urticarial. He is not diaphoretic. No cyanosis or erythema. No pallor. Nails show no clubbing.  Scalp with macular and papular lesion mixed 2-59mm diameter no scaling or erythema noted  Psychiatric: He has a normal mood and affect. His speech is normal and behavior is normal. Judgment and thought content normal. Cognition and memory are normal.  Nursing note and vitals reviewed.   ED Course  Procedures (including critical care time)  Labs Review Labs Reviewed - No data to display  Imaging Review No results found.   MDM   1. Acute bronchitis, unspecified organism   2. Otitis media with  effusion, bilateral   3. Other seasonal allergic rhinitis    Albuterol MDI 1-2 puffs po q4-6h po prn wheezing,  Tessalon pearles 200mg  po TID prn cough.  If fever greater than 100.5, rusty/opaque sputum with worsening productive cough, worsening ear pain or sinus pain start augmentin 875mg  po BID x 10 days Rx given.  Start nasal saline 2 sprays each nostril q2h whille awake for nasal congestion.  Bronchitis simple, community acquired, may have started as viral (probably respiratory syncytial, parainfluenza, influenza, or adenovirus), but now evidence of acute purulent bronchitis with resultant bronchial edema and mucus formation.  Viruses are the most common cause of bronchial inflammation in otherwise healthy adults with acute bronchitis.  The appearance of sputum is not predictive of whether a bacterial infection is present.  Purulent sputum is most often caused by viral infections.  There are a small portion of those caused by non-viral agents being Mycoplamsa pneumonia.  Microscopic examination or C&S of sputum in the healthy adult with acute bronchitis is  generally not helpful (usually negative or normal respiratory flora) other considerations being cough from upper respiratory tract infections, sinusitis or allergic syndromes (mild asthma or viral pneumonia).  Differential Diagnosis:  reactive airway disease (asthma, allergic aspergillosis (eosinophilia), chronic bronchitis, respiratory infection (Sinusitis, Common cold, pneumonia), congestive heart failure, reflux esophagitis, bronchogenic tumor, aspiration syndromes and/or exposure irritants/tobacco smoke.  In this case, there is no evidence of any invasive bacterial illness.  Most likely viral etiology so will hold on antibiotic treatment.  Advise supportive care with rest, encourage fluids, good hygiene and watch for any worsening symptoms.  If they were to develop:  come back to the office or go to the emergency room if after hours. Without high fever, severe dyspnea, lack of physical findings or other risk factors, I will hold on a chest radiograph and CBC at this time. I discussed that approximately 50% of patients with acute bronchitis have a cough that lasts up to three weeks, and 25% for over a month.  Tylenol, one to two tablets every four hours as needed for fever or myalgias.   No aspirin.  Patient instructed to follow up in one week or sooner if symptoms worsen. Patient verbalized agreement and understanding of treatment plan.  P2:  hand washing and cover cough  Supportive treatment.   No evidence of invasive bacterial infection, non toxic and well hydrated.  This is most likely self limiting viral infection.  I do not see where any further testing or imaging is necessary at this time.   I will suggest supportive care, rest, good hygiene and encourage the patient to take adequate fluids.  The patient is to return to clinic or EMERGENCY ROOM if symptoms worsen or change significantly e.g. ear pain, fever, purulent discharge from ears or bleeding.  Exitcare handout on otitis media with effusion given  to patient.  Patient verbalized agreement and understanding of treatment plan.    Patient may use normal saline nasal spray as needed.  Consider antihistamine or nasal steroid use.  Avoid triggers if possible.  Shower prior to bedtime if exposed to triggers.  If allergic dust/dust mites recommend mattress/pillow covers/encasements; washing linens, vacuuming, sweeping, dusting weekly.  Call or return to clinic as needed if these symptoms worsen or fail to improve as anticipated.   Exitcare handout on allergic rhinitis given to patient.  Patient verbalized understanding of instructions, agreed with plan of care and had no further questions at this time.  P2:  Avoidance and  hand washing.  Continue treatment as prescribed by PCM.  May continue trial of tgel shampoo.  If worsening or no improvement follow up for re-evaluation with PCM or dermatologist consider skin biopsy  DDx: solar lentigo, seborrheic dermatitis, contact dermatitis, skin cancer  Patient verbalized understanding of information/instructions, agreed with plan of care and had no further questions at this time.  Olen Cordial, NP 02/05/15 [phone]

## 2015-02-15 DIAGNOSIS — H2513 Age-related nuclear cataract, bilateral: Secondary | ICD-10-CM | POA: Diagnosis not present

## 2015-02-21 ENCOUNTER — Encounter: Payer: Self-pay | Admitting: Family Medicine

## 2015-02-21 ENCOUNTER — Ambulatory Visit (INDEPENDENT_AMBULATORY_CARE_PROVIDER_SITE_OTHER): Payer: Medicare Other | Admitting: Family Medicine

## 2015-02-21 VITALS — BP 125/68 | HR 60 | Temp 97.6°F | Resp 16 | Ht 74.0 in | Wt 223.2 lb

## 2015-02-21 DIAGNOSIS — R059 Cough, unspecified: Secondary | ICD-10-CM

## 2015-02-21 DIAGNOSIS — J0111 Acute recurrent frontal sinusitis: Secondary | ICD-10-CM | POA: Diagnosis not present

## 2015-02-21 DIAGNOSIS — E118 Type 2 diabetes mellitus with unspecified complications: Secondary | ICD-10-CM

## 2015-02-21 DIAGNOSIS — R05 Cough: Secondary | ICD-10-CM

## 2015-02-21 DIAGNOSIS — I10 Essential (primary) hypertension: Secondary | ICD-10-CM

## 2015-02-21 MED ORDER — LEVOFLOXACIN 500 MG PO TABS: 500.0000 mg | ORAL_TABLET | Freq: Every day | ORAL | Status: DC

## 2015-02-21 NOTE — Progress Notes (Signed)
Name: Keith Cruz   MRN: [id number]    DOB: 09/09/1940   Date:02/21/2015       Progress Note  Subjective  Chief Complaint  Chief Complaint  Patient presents with  . Diabetes    highest BS 135 lowest 98 and avarage 102    HPI Here for f/u of DM.  BSs run from 100-125; avg about 115.  C/o recurrent cough and congestion.  Head/sinuses and chest.  Thinks he ran a fever last night.  Cough productive of green sputum.   Treated at Urgent Care 2-3 weeks ago with Augmentin. For same sx.  Resolved and returned.   No problem-specific assessment & plan notes found for this encounter.   Past Medical History  Diagnosis Date  . Hypertension   . Kidney disease   . Seasonal allergies     Social History  Substance Use Topics  . Smoking status: Current Some Day Smoker -- 2.00 packs/day for 10 years    Types: Cigarettes  . Smokeless tobacco: Never Used  . Alcohol Use: No     Comment: previous     Current outpatient prescriptions:  .  albuterol (PROVENTIL HFA;VENTOLIN HFA) 108 (90 BASE) MCG/ACT inhaler, Inhale 1-2 puffs into the lungs every 4 (four) hours as needed for wheezing or shortness of breath., Disp: 1 Inhaler, Rfl: 0 .  Cholecalciferol (VITAMIN D3) 2000 UNITS capsule, Take 2,000 Units by mouth daily., Disp: , Rfl:  .  clobetasol (TEMOVATE) 0.05 % external solution, Apply 2 application topically as needed., Disp: , Rfl:  .  clopidogrel (PLAVIX) 75 MG tablet, Take 75 mg by mouth daily., Disp: , Rfl:  .  Cyanocobalamin (RA VITAMIN B-12 TR) 1000 MCG TBCR, Take 1,000 mcg by mouth daily., Disp: , Rfl:  .  fexofenadine (ALLEGRA) 180 MG tablet, Take 180 mg by mouth daily., Disp: , Rfl:  .  folic acid (FOLVITE) 1 MG tablet, Take 0.8 mg by mouth daily., Disp: , Rfl:  .  furosemide (LASIX) 20 MG tablet, Take 1 tablet (20 mg total) by mouth daily., Disp: 30 tablet, Rfl: 3 .  LYCOPENE PO, Take 1 tablet by mouth daily., Disp: , Rfl:  .  metoprolol (LOPRESSOR) 50 MG tablet, Take 50 mg by mouth  daily., Disp: , Rfl:  .  pantoprazole (PROTONIX) 40 MG tablet, Take 40 mg by mouth daily., Disp: , Rfl:  .  sertraline (ZOLOFT) 50 MG tablet, Take 1 tablet (50 mg total) by mouth daily., Disp: 30 tablet, Rfl: 3 .  simvastatin (ZOCOR) 40 MG tablet, Take 40 mg by mouth daily., Disp: , Rfl:  .  sodium chloride (OCEAN) 0.65 % SOLN nasal spray, Place 2 sprays into both nostrils every 2 (two) hours while awake., Disp: , Rfl: 0 .  triamcinolone cream (KENALOG) 0.1 %, Apply 1 application topically as needed., Disp: , Rfl:   Allergies  Allergen Reactions  . Fexofenadine-Pseudoephed Er Swelling    Tongue and face. Only with D product. Tolerates plain allegra at home.   . Nifedipine Swelling    Tongue and face Tongue and face  . Allegra  [Fexofenadine] Swelling    Tongue and face. Only with D product. Tolerates plain allegra at home.   . Prednisone     Other reaction(s): Other (See Comments) Mental status changes Other reaction(s): Other (See Comments) Mental status changes    Review of Systems  Constitutional: Positive for fever. Negative for weight loss and malaise/fatigue.  HENT: Positive for congestion and sore throat.  Eyes: Negative for blurred vision and double vision.  Respiratory: Positive for cough and sputum production. Negative for shortness of breath and wheezing.   Cardiovascular: Negative for chest pain, palpitations, orthopnea and leg swelling.  Gastrointestinal: Positive for heartburn, abdominal pain and blood in stool.  Genitourinary: Negative for dysuria, urgency and frequency.  Skin: Negative for rash.  Neurological: Negative for weakness and headaches.      Objective  Filed Vitals:   02/21/15 0922  BP: 125/68  Pulse: 60  Temp: 97.6 F (36.4 C)  TempSrc: Oral  Resp: 16  Height: 6\' 2"  (1.88 m)  Weight: 223 lb 3.2 oz (101.243 kg)     Physical Exam  Constitutional: He is oriented to person, place, and time and well-developed, well-nourished, and in no  distress. No distress.  HENT:  Head: Normocephalic and atraumatic.  Right Ear: Tympanic membrane, external ear and ear canal normal.  Left Ear: Tympanic membrane, external ear and ear canal normal.  Nose: No mucosal edema or rhinorrhea. Right sinus exhibits frontal sinus tenderness. Right sinus exhibits no maxillary sinus tenderness. Left sinus exhibits frontal sinus tenderness. Left sinus exhibits no maxillary sinus tenderness.  Mouth/Throat: No oropharyngeal exudate, posterior oropharyngeal edema or posterior oropharyngeal erythema.  Neck: Normal range of motion. Neck supple. Carotid bruit is not present. No thyromegaly present.  Cardiovascular: Normal rate, regular rhythm, normal heart sounds and intact distal pulses.  Exam reveals no gallop and no friction rub.   No murmur heard. Pulmonary/Chest: Effort normal and breath sounds normal. No respiratory distress. He has no wheezes. He has no rales.  Abdominal: Soft. Bowel sounds are normal. He exhibits no distension. There is no tenderness. There is no rebound.  Musculoskeletal: He exhibits no edema.  Lymphadenopathy:    He has no cervical adenopathy.  Neurological: He is alert and oriented to person, place, and time.  Vitals reviewed.     Recent Results (from the past 2160 hour(s))  Comprehensive Metabolic Panel (CMET)     Status: Abnormal   Collection Time: 01/22/15  9:43 AM  Result Value Ref Range   Glucose 120 (H) 65 - 99 mg/dL   BUN 23 8 - 27 mg/dL   Creatinine, Ser 1.63 (H) 0.76 - 1.27 mg/dL   GFR calc non Af Amer 41 (L) >59 mL/min/1.73   GFR calc Af Amer 47 (L) >59 mL/min/1.73   BUN/Creatinine Ratio 14 10 - 22   Sodium 142 134 - 144 mmol/L   Potassium 5.7 (H) 3.5 - 5.2 mmol/L   Chloride 100 97 - 108 mmol/L   CO2 23 18 - 29 mmol/L   Calcium 9.6 8.6 - 10.2 mg/dL   Total Protein 7.6 6.0 - 8.5 g/dL   Albumin 4.5 3.5 - 4.8 g/dL   Globulin, Total 3.1 1.5 - 4.5 g/dL   Albumin/Globulin Ratio 1.5 1.1 - 2.5   Bilirubin Total 0.6  0.0 - 1.2 mg/dL   Alkaline Phosphatase 64 39 - 117 IU/L   AST 23 0 - 40 IU/L   ALT 21 0 - 44 IU/L  CBC with Differential     Status: Abnormal   Collection Time: 01/22/15  9:43 AM  Result Value Ref Range   WBC 7.9 3.4 - 10.8 x10E3/uL   RBC 4.65 4.14 - 5.80 x10E6/uL   Hemoglobin 15.6 12.6 - 17.7 g/dL   Hematocrit 46.0 37.5 - 51.0 %   MCV 99 (H) 79 - 97 fL   MCH 33.5 (H) 26.6 - 33.0 pg   MCHC 33.9  31.5 - 35.7 g/dL   RDW 13.2 12.3 - 15.4 %   Platelets 300 150 - 379 x10E3/uL   Neutrophils 63 %   Lymphs 23 %   Monocytes 11 %   Eos 3 %   Basos 0 %   Neutrophils Absolute 5.0 1.4 - 7.0 x10E3/uL   Lymphocytes Absolute 1.8 0.7 - 3.1 x10E3/uL   Monocytes Absolute 0.9 0.1 - 0.9 x10E3/uL   EOS (ABSOLUTE) 0.2 0.0 - 0.4 x10E3/uL   Basophils Absolute 0.0 0.0 - 0.2 x10E3/uL   Immature Granulocytes 0 %   Immature Grans (Abs) 0.0 0.0 - 0.1 x10E3/uL  Lipid Profile     Status: None   Collection Time: 01/22/15  9:43 AM  Result Value Ref Range   Cholesterol, Total 159 100 - 199 mg/dL   Triglycerides 127 0 - 149 mg/dL   HDL 41 >39 mg/dL    Comment: According to ATP-III Guidelines, HDL-C >59 mg/dL is considered a negative risk factor for CHD.    VLDL Cholesterol Cal 25 5 - 40 mg/dL   LDL Calculated 93 0 - 99 mg/dL   Chol/HDL Ratio 3.9 0.0 - 5.0 ratio units    Comment:                                   T. Chol/HDL Ratio                                             Men  Women                               1/2 Avg.Risk  3.4    3.3                                   Avg.Risk  5.0    4.4                                2X Avg.Risk  9.6    7.1                                3X Avg.Risk 23.4   11.0   HgB A1c     Status: Abnormal   Collection Time: 01/22/15  9:43 AM  Result Value Ref Range   Hgb A1c MFr Bld 6.6 (H) 4.8 - 5.6 %    Comment:          Pre-diabetes: 5.7 - 6.4          Diabetes: >6.4          Glycemic control for adults with diabetes: <7.0    Est. average glucose Bld gHb Est-mCnc 143  mg/dL  TSH     Status: None   Collection Time: 01/22/15  9:43 AM  Result Value Ref Range   TSH 2.380 0.450 - 4.500 uIU/mL     Assessment & Plan  1. Type 2 diabetes mellitus with complication, without long-term current use of insulin ([place])   2. Benign essential HTN   3. Acute recurrent frontal sinusitis  -  levofloxacin (LEVAQUIN) 500 MG tablet; Take 1 tablet (500 mg total) by mouth daily.  Dispense: 10 tablet; Refill: 0  4. Cough -Mucinex DM for cough

## 2015-02-21 NOTE — Patient Instructions (Signed)
Continue current meds.  Keep checking blood sugars.

## 2015-04-17 DIAGNOSIS — I701 Atherosclerosis of renal artery: Secondary | ICD-10-CM | POA: Diagnosis not present

## 2015-04-17 DIAGNOSIS — I2510 Atherosclerotic heart disease of native coronary artery without angina pectoris: Secondary | ICD-10-CM | POA: Diagnosis not present

## 2015-04-17 DIAGNOSIS — I6523 Occlusion and stenosis of bilateral carotid arteries: Secondary | ICD-10-CM | POA: Diagnosis not present

## 2015-04-17 DIAGNOSIS — I10 Essential (primary) hypertension: Secondary | ICD-10-CM | POA: Diagnosis not present

## 2015-04-29 DIAGNOSIS — A879 Viral meningitis, unspecified: Secondary | ICD-10-CM

## 2015-04-29 HISTORY — DX: Viral meningitis, unspecified: A87.9

## 2015-05-09 ENCOUNTER — Ambulatory Visit (INDEPENDENT_AMBULATORY_CARE_PROVIDER_SITE_OTHER): Payer: Medicare Other | Admitting: Family Medicine

## 2015-05-09 ENCOUNTER — Encounter: Payer: Self-pay | Admitting: Family Medicine

## 2015-05-09 VITALS — BP 124/69 | HR 69 | Temp 97.5°F | Resp 16 | Ht 76.0 in | Wt 220.0 lb

## 2015-05-09 DIAGNOSIS — N183 Chronic kidney disease, stage 3 unspecified: Secondary | ICD-10-CM

## 2015-05-09 DIAGNOSIS — J40 Bronchitis, not specified as acute or chronic: Secondary | ICD-10-CM | POA: Diagnosis not present

## 2015-05-09 DIAGNOSIS — I10 Essential (primary) hypertension: Secondary | ICD-10-CM | POA: Diagnosis not present

## 2015-05-09 DIAGNOSIS — E118 Type 2 diabetes mellitus with unspecified complications: Secondary | ICD-10-CM | POA: Diagnosis not present

## 2015-05-09 MED ORDER — ALBUTEROL SULFATE HFA 108 (90 BASE) MCG/ACT IN AERS: 1.0000 | INHALATION_SPRAY | RESPIRATORY_TRACT | Status: DC | PRN

## 2015-05-09 MED ORDER — AZITHROMYCIN 250 MG PO TABS: ORAL_TABLET | ORAL | Status: DC

## 2015-05-09 NOTE — Progress Notes (Signed)
Date:  05/09/2015   Name:  Keith Cruz   DOB:  [birth year]   MRN:  [id number]  PCP:  Dicky Doe, MD    Chief Complaint: Cough and Fever   History of Present Illness:  This is a 75 y.o. male with T2DM, CAD, HTN, and CKD3 reports 5d hx sore throat, sinus congestion, rhinorrhea,cough prod yellow phlegm, LG fever. Hx bronchospasm with respiratory infections, requests refill albuterol, non-smoker. To be married in 12 days. Last labs in September, has appt to see Dr. Luan Pulling in four weeks.  Review of Systems:  Review of Systems  HENT: Negative for ear pain and trouble swallowing.   Eyes: Negative for pain.  Respiratory: Negative for shortness of breath.   Cardiovascular: Negative for chest pain and leg swelling.  Endocrine: Negative for polyuria.  Genitourinary: Negative for difficulty urinating.  Neurological: Negative for syncope and light-headedness.    Patient Active Problem List   Diagnosis Date Noted  . Elevated cholesterol 01/22/2015  . Acid reflux 01/22/2015  . Diabetes mellitus type 2 with complications ([place]) 123456  . Depression 01/22/2015  . Insomnia 01/22/2015  . Benign essential HTN 12/28/2014  . Carotid artery narrowing 07/13/2014  . Arteriosclerosis of coronary artery 07/13/2014  . Chronic kidney disease (CKD), stage III (moderate) 07/13/2014  . Combined fat and carbohydrate induced hyperlipemia 03/02/2014  . Anxiety 11/30/2013  . Fast heart beat 11/30/2013  . Acute blood loss anemia 10/10/2013  . Atrial fibrillation ([place]) 10/10/2013  . Elevated WBC count 10/10/2013    Prior to Admission medications   Medication Sig Start Date End Date Taking? Authorizing Provider  albuterol (PROVENTIL HFA;VENTOLIN HFA) 108 (90 Base) MCG/ACT inhaler Inhale 1-2 puffs into the lungs every 4 (four) hours as needed for wheezing or shortness of breath. 05/09/15  Yes Adline Potter, MD  Cholecalciferol (VITAMIN D3) 2000 UNITS capsule Take 2,000 Units by mouth daily.   Yes  Historical Provider, MD  clobetasol (TEMOVATE) 0.05 % external solution Apply 2 application topically as needed. 12/11/14  Yes Historical Provider, MD  clopidogrel (PLAVIX) 75 MG tablet Take 75 mg by mouth daily. 06/16/14 06/16/15 Yes Historical Provider, MD  Cyanocobalamin (RA VITAMIN B-12 TR) 1000 MCG TBCR Take 1,000 mcg by mouth daily.   Yes Historical Provider, MD  fexofenadine (ALLEGRA) 180 MG tablet Take 180 mg by mouth daily.   Yes Historical Provider, MD  folic acid (FOLVITE) 1 MG tablet Take 0.8 mg by mouth daily.   Yes Historical Provider, MD  furosemide (LASIX) 20 MG tablet Take 1 tablet (20 mg total) by mouth daily. 01/24/15  Yes Arlis Porta., MD  LYCOPENE PO Take 1 tablet by mouth daily.   Yes Historical Provider, MD  metoprolol (LOPRESSOR) 50 MG tablet Take 50 mg by mouth daily. 09/22/14  Yes Historical Provider, MD  pantoprazole (PROTONIX) 40 MG tablet Take 40 mg by mouth daily.   Yes Historical Provider, MD  sertraline (ZOLOFT) 50 MG tablet Take 1 tablet (50 mg total) by mouth daily. Patient taking differently: Take 50 mg by mouth daily. Takes 1/2 tablet each AM. 01/22/15  Yes Arlis Porta., MD  sodium chloride (OCEAN) 0.65 % SOLN nasal spray Place 2 sprays into both nostrils every 2 (two) hours while awake. 02/03/15  Yes Olen Cordial, NP  triamcinolone cream (KENALOG) 0.1 % Apply 1 application topically as needed. 12/11/14  Yes Historical Provider, MD  azithromycin (ZITHROMAX) 250 MG tablet Take two tablets today then one tablet daily for four  days 05/09/15   Adline Potter, MD  simvastatin (ZOCOR) 40 MG tablet Take 40 mg by mouth daily. 03/02/14 03/02/15  Historical Provider, MD    Allergies  Allergen Reactions  . Fexofenadine-Pseudoephed Er Swelling    Tongue and face. Only with D product. Tolerates plain allegra at home.  Tongue and face. Only with D product. Tolerates plain allegra at home.   . Nifedipine Swelling    Tongue and face Tongue and face Tongue and face   . Allegra  [Fexofenadine] Swelling    Tongue and face. Only with D product. Tolerates plain allegra at home.   . Prednisone     Other reaction(s): Other (See Comments) Mental status changes Other reaction(s): Other (See Comments) Mental status changes    History reviewed. No pertinent past surgical history.  Social History  Substance Use Topics  . Smoking status: Current Some Day Smoker -- 2.00 packs/day for 10 years    Types: Cigarettes  . Smokeless tobacco: Never Used  . Alcohol Use: No     Comment: previous    Family History  Problem Relation Age of Onset  . Stroke Mother   . Heart attack Mother   . Stroke Father   . Stroke Paternal Grandmother   . Stroke Paternal Grandfather     Medication list has been reviewed and updated.  Physical Examination: BP 124/69 mmHg  Pulse 69  Temp(Src) 97.5 F (36.4 C)  Resp 16  Ht 6\' 4"  (1.93 m)  Wt 220 lb (99.791 kg)  BMI 26.79 kg/m2  SpO2 98%  Physical Exam  Constitutional: He appears well-developed and well-nourished.  HENT:  Mouth/Throat: No oropharyngeal exudate.  OP mild erythema Mild B max sinus tenderness  Neck: Neck supple.  Cardiovascular: Normal rate, regular rhythm and normal heart sounds.   Pulmonary/Chest: Effort normal and breath sounds normal. He has no wheezes. He has no rales.  Musculoskeletal: He exhibits no edema.  Lymphadenopathy:    He has no cervical adenopathy.  Neurological: He is alert.  Skin: Skin is warm and dry.  Psychiatric: He has a normal mood and affect. His behavior is normal.  Nursing note and vitals reviewed.   Assessment and Plan:  1. Bronchitis Likely viral but given risk factors and pending marriage will cover with Zpak, call if sxs worsen/persist  2. Benign essential HTN Well controlled  3. Type 2 diabetes mellitus with complication, without long-term current use of insulin (HCC) Well controlled but needs f/u  4. Chronic kidney disease (CKD), stage III  (moderate) Started on lasix in September, unclear if ever saw nephrology, also needs f/u  Return in about 4 weeks (around 06/06/2015). As scheduled with Dr. Posey Boyer. [facility]  05/09/2015

## 2015-05-09 NOTE — Patient Instructions (Signed)

## 2015-05-11 ENCOUNTER — Ambulatory Visit
Admission: EM | Admit: 2015-05-11 | Discharge: 2015-05-11 | Disposition: A | Discharge status: Home / Self Care | Payer: Medicare Other | Attending: Family Medicine | Admitting: Family Medicine

## 2015-05-11 ENCOUNTER — Telehealth: Payer: Self-pay | Admitting: Family Medicine

## 2015-05-11 DIAGNOSIS — J440 Chronic obstructive pulmonary disease with acute lower respiratory infection: Secondary | ICD-10-CM | POA: Diagnosis not present

## 2015-05-11 DIAGNOSIS — I2510 Atherosclerotic heart disease of native coronary artery without angina pectoris: Secondary | ICD-10-CM | POA: Diagnosis not present

## 2015-05-11 DIAGNOSIS — T801XXA Vascular complications following infusion, transfusion and therapeutic injection, initial encounter: Secondary | ICD-10-CM | POA: Diagnosis not present

## 2015-05-11 DIAGNOSIS — R0602 Shortness of breath: Secondary | ICD-10-CM | POA: Diagnosis not present

## 2015-05-11 DIAGNOSIS — Z781 Physical restraint status: Secondary | ICD-10-CM | POA: Diagnosis not present

## 2015-05-11 DIAGNOSIS — R404 Transient alteration of awareness: Secondary | ICD-10-CM | POA: Diagnosis not present

## 2015-05-11 DIAGNOSIS — J9601 Acute respiratory failure with hypoxia: Secondary | ICD-10-CM | POA: Diagnosis not present

## 2015-05-11 DIAGNOSIS — I6521 Occlusion and stenosis of right carotid artery: Secondary | ICD-10-CM | POA: Diagnosis not present

## 2015-05-11 DIAGNOSIS — A419 Sepsis, unspecified organism: Secondary | ICD-10-CM | POA: Diagnosis not present

## 2015-05-11 DIAGNOSIS — R918 Other nonspecific abnormal finding of lung field: Secondary | ICD-10-CM | POA: Diagnosis not present

## 2015-05-11 DIAGNOSIS — G9340 Encephalopathy, unspecified: Secondary | ICD-10-CM | POA: Diagnosis not present

## 2015-05-11 DIAGNOSIS — R531 Weakness: Secondary | ICD-10-CM | POA: Diagnosis not present

## 2015-05-11 DIAGNOSIS — I517 Cardiomegaly: Secondary | ICD-10-CM | POA: Diagnosis not present

## 2015-05-11 DIAGNOSIS — N189 Chronic kidney disease, unspecified: Secondary | ICD-10-CM | POA: Diagnosis not present

## 2015-05-11 DIAGNOSIS — R1013 Epigastric pain: Secondary | ICD-10-CM | POA: Diagnosis not present

## 2015-05-11 DIAGNOSIS — I480 Paroxysmal atrial fibrillation: Secondary | ICD-10-CM | POA: Diagnosis not present

## 2015-05-11 DIAGNOSIS — I129 Hypertensive chronic kidney disease with stage 1 through stage 4 chronic kidney disease, or unspecified chronic kidney disease: Secondary | ICD-10-CM | POA: Diagnosis not present

## 2015-05-11 DIAGNOSIS — Z951 Presence of aortocoronary bypass graft: Secondary | ICD-10-CM | POA: Diagnosis not present

## 2015-05-11 DIAGNOSIS — R471 Dysarthria and anarthria: Secondary | ICD-10-CM | POA: Diagnosis not present

## 2015-05-11 DIAGNOSIS — J159 Unspecified bacterial pneumonia: Secondary | ICD-10-CM | POA: Diagnosis not present

## 2015-05-11 DIAGNOSIS — Z743 Need for continuous supervision: Secondary | ICD-10-CM | POA: Diagnosis not present

## 2015-05-11 DIAGNOSIS — N179 Acute kidney failure, unspecified: Secondary | ICD-10-CM | POA: Diagnosis not present

## 2015-05-11 DIAGNOSIS — R509 Fever, unspecified: Secondary | ICD-10-CM | POA: Diagnosis not present

## 2015-05-11 DIAGNOSIS — E785 Hyperlipidemia, unspecified: Secondary | ICD-10-CM | POA: Diagnosis not present

## 2015-05-11 DIAGNOSIS — R4182 Altered mental status, unspecified: Secondary | ICD-10-CM | POA: Diagnosis not present

## 2015-05-11 DIAGNOSIS — R1031 Right lower quadrant pain: Secondary | ICD-10-CM | POA: Diagnosis not present

## 2015-05-11 DIAGNOSIS — Z452 Encounter for adjustment and management of vascular access device: Secondary | ICD-10-CM | POA: Diagnosis not present

## 2015-05-11 DIAGNOSIS — R4781 Slurred speech: Secondary | ICD-10-CM | POA: Diagnosis not present

## 2015-05-11 DIAGNOSIS — Z8673 Personal history of transient ischemic attack (TIA), and cerebral infarction without residual deficits: Secondary | ICD-10-CM | POA: Diagnosis not present

## 2015-05-11 DIAGNOSIS — I674 Hypertensive encephalopathy: Secondary | ICD-10-CM | POA: Diagnosis not present

## 2015-05-11 DIAGNOSIS — I808 Phlebitis and thrombophlebitis of other sites: Secondary | ICD-10-CM | POA: Diagnosis not present

## 2015-05-11 DIAGNOSIS — J189 Pneumonia, unspecified organism: Secondary | ICD-10-CM | POA: Diagnosis not present

## 2015-05-11 DIAGNOSIS — G4733 Obstructive sleep apnea (adult) (pediatric): Secondary | ICD-10-CM | POA: Diagnosis not present

## 2015-05-11 DIAGNOSIS — J9811 Atelectasis: Secondary | ICD-10-CM | POA: Diagnosis not present

## 2015-05-11 DIAGNOSIS — G40909 Epilepsy, unspecified, not intractable, without status epilepticus: Secondary | ICD-10-CM | POA: Diagnosis not present

## 2015-05-11 DIAGNOSIS — R569 Unspecified convulsions: Secondary | ICD-10-CM | POA: Diagnosis not present

## 2015-05-11 DIAGNOSIS — R4701 Aphasia: Secondary | ICD-10-CM | POA: Diagnosis not present

## 2015-05-11 DIAGNOSIS — I6523 Occlusion and stenosis of bilateral carotid arteries: Secondary | ICD-10-CM | POA: Diagnosis not present

## 2015-05-11 DIAGNOSIS — K219 Gastro-esophageal reflux disease without esophagitis: Secondary | ICD-10-CM | POA: Diagnosis not present

## 2015-05-11 NOTE — Telephone Encounter (Signed)
Pt was in parking  struggling to get out of car I told the driver she need to take pt [facility]  Ask Driver if we need to call EMS she said no

## 2015-05-11 NOTE — ED Provider Notes (Signed)
[facility]  ____________________________________________  Time seen: Approximately 5:42 PM  I have reviewed the triage vital signs and the nursing notes.   HISTORY  Chief Complaint Altered Mental Status and Shaking  HPI PRESTAN BRAND is a 75 y.o. male   presents with family at bedside for the complaints of altered mental status. Patient's son and fianc at bedside reports that patient has had recent bronchitis and is currently on day 3 of a Z-Pak. Reports for the last 2-3 days patient has had a fever intermittently at home. Reports fever maximum was 102 orally yesterday. Also reports gradual onset of weakness. Fianc at bedside reports for the last 3 hours patient has had increased weakness as well as some difficulty when talking.  Patient also reports some accompanying shortness of breath. Reports weakness. Son at bedside reports that patient has not been acting himself today described as sending text messages that did not make sense.  Denies chest pain, abdominal pain, headache, vision changes, fall or injury. Denies head injury. Reports patient has a history of a CVA in 1990 without any chronic deficits. Also reports history of coronary bypass.   Past Medical History  Diagnosis Date  . Hypertension   . Kidney disease   . Seasonal allergies     Patient Active Problem List   Diagnosis Date Noted  . Elevated cholesterol 01/22/2015  . Acid reflux 01/22/2015  . Diabetes mellitus type 2 with complications ([place]) 123456  . Depression 01/22/2015  . Insomnia 01/22/2015  . Benign essential HTN 12/28/2014  . Carotid artery narrowing 07/13/2014  . Arteriosclerosis of coronary artery 07/13/2014  . Chronic kidney disease (CKD), stage III (moderate) 07/13/2014  . Combined fat and carbohydrate induced hyperlipemia 03/02/2014  . Anxiety 11/30/2013  . Fast heart beat 11/30/2013  . Acute blood loss anemia 10/10/2013  . Atrial fibrillation ([place]) 10/10/2013  . Elevated WBC  count 10/10/2013    No past surgical history on file.  Current Outpatient Rx  Name  Route  Sig  Dispense  Refill  . albuterol (PROVENTIL HFA;VENTOLIN HFA) 108 (90 Base) MCG/ACT inhaler   Inhalation   Inhale 1-2 puffs into the lungs every 4 (four) hours as needed for wheezing or shortness of breath.   1 Inhaler   2   . azithromycin (ZITHROMAX) 250 MG tablet      Take two tablets today then one tablet daily for four days   6 tablet   0   . Cholecalciferol (VITAMIN D3) 2000 UNITS capsule   Oral   Take 2,000 Units by mouth daily.         . clobetasol (TEMOVATE) 0.05 % external solution   Topical   Apply 2 application topically as needed.         . clopidogrel (PLAVIX) 75 MG tablet   Oral   Take 75 mg by mouth daily.         . Cyanocobalamin (RA VITAMIN B-12 TR) 1000 MCG TBCR   Oral   Take 1,000 mcg by mouth daily.         . fexofenadine (ALLEGRA) 180 MG tablet   Oral   Take 180 mg by mouth daily.         . folic acid (FOLVITE) 1 MG tablet   Oral   Take 0.8 mg by mouth daily.         . furosemide (LASIX) 20 MG tablet   Oral   Take 1 tablet (20 mg total) by mouth daily.  30 tablet   3   . LYCOPENE PO   Oral   Take 1 tablet by mouth daily.         . metoprolol (LOPRESSOR) 50 MG tablet   Oral   Take 50 mg by mouth daily.         . pantoprazole (PROTONIX) 40 MG tablet   Oral   Take 40 mg by mouth daily.         . sertraline (ZOLOFT) 50 MG tablet   Oral   Take 1 tablet (50 mg total) by mouth daily. Patient taking differently: Take 50 mg by mouth daily. Takes 1/2 tablet each AM.   30 tablet   3   . EXPIRED: simvastatin (ZOCOR) 40 MG tablet   Oral   Take 40 mg by mouth daily.         . sodium chloride (OCEAN) 0.65 % SOLN nasal spray   Each Nare   Place 2 sprays into both nostrils every 2 (two) hours while awake.      0   . triamcinolone cream (KENALOG) 0.1 %   Topical   Apply 1 application topically as needed.            Allergies Fexofenadine-pseudoephed er; Nifedipine; Allegra ; and Prednisone  Family History  Problem Relation Age of Onset  . Stroke Mother   . Heart attack Mother   . Stroke Father   . Stroke Paternal Grandmother   . Stroke Paternal Grandfather     Social History Social History  Substance Use Topics  . Smoking status: Current Some Day Smoker -- 2.00 packs/day for 10 years    Types: Cigarettes  . Smokeless tobacco: Never Used  . Alcohol Use: No     Comment: previous    Review of Systems Constitutional: Positives reported fever.  Eyes: No visual changes. ENT: No sore throat. Cardiovascular: Denies chest pain. Respiratory: Denies shortness of breath. Gastrointestinal: No abdominal pain.  No nausea, no vomiting.  No diarrhea.  No constipation. Genitourinary: Negative for dysuria. Musculoskeletal: Negative for back pain. Skin: Negative for rash. Neurological: Negative for headaches, focal weakness or numbness.Positive generalized weakness and confusion.  10-point ROS otherwise negative.  ____________________________________________   PHYSICAL EXAM:  VITAL SIGNS: ED Triage Vitals  Enc Vitals Group     BP 05/11/15 1727 162/85 mmHg     Pulse Rate 05/11/15 1727 96     Resp 05/11/15 1727 20     Temp 05/11/15 1727 99.2 F (37.3 C)     Temp Source 05/11/15 1727 Oral     SpO2 05/11/15 1727 100 %     Weight --      Height --      Head Cir --      Peak Flow --      Pain Score --      Pain Loc --      Pain Edu? --      Excl. in [place]? --     Constitutional: Alert. Well appearing and in no acute distress. Eyes: Conjunctivae are normal. PERRL. EOMI. Head: Atraumatic.  Ears: no erythema, normal TMs bilaterally.   Nose: No congestion/rhinnorhea.  Mouth/Throat: Mucous membranes are moist.  Oropharynx non-erythematous. Neck: No stridor.  No cervical spine tenderness to palpation. Hematological/Lymphatic/Immunilogical: No cervical lymphadenopathy. Cardiovascular:  Normal rate, regular rhythm. Grossly normal heart sounds.  Good peripheral circulation. Respiratory: Normal respiratory effort.  No retractions.No wheezes, rales or rhonchi. Generalized decreased breath sounds.  Gastrointestinal: Soft and nontender.  Normal Bowel sounds.   Musculoskeletal: No lower or upper extremity tenderness nor edema.   Bilateral pedal pulses equal and easily palpated.  Neurologic: Patient with noted generalized weakness. Patient following commands and directions well however with noted intermittent expressive aphasia. Patient ambulatory in room with assistance with generalized weakness. Sensation intact to bilateral upper and lower extremities.  Skin:  Skin is warm, dry and intact. No rash noted. Psychiatric: Mood and affect are normal. Speech and behavior are normal.  ____________________________________________   LABS (all labs ordered are listed, but only abnormal results are displayed)  Labs Reviewed - No data to display ____________________________________________  EKG  ED ECG REPORT I, Marylene Land, the attending provider and Dr Karmen Bongo, personally viewed and interpreted this ECG. EKG strip obtained from EMS on arrival.   Date: 05/11/2015  EKG Time: 1740  Rate: 90  Rhythm: sinus arrhythmia  Axis: Normal  Intervals: Normal  ST&T Change: None    INITIAL IMPRESSION / ASSESSMENT AND PLAN / ED COURSE  Pertinent labs & imaging results that were available during my care of the patient were reviewed by me and considered in my medical decision making (see chart for details).   Present to family at bedside for complaints of recent bronchitis as well as fever for the last 2-3 days with progressing weakness. Reports acute altered mental status with speech changes in the last 3 hours. Myself and Marquita Palms went to receive patient out of waiting room due to chief complaints and found that [employer] fire department currently in waiting room with patient. Patient's son  member of [employer]. The fire department reports that they went ahead and called EMS from waiting room. Patient then taken to room 5 for further evaluation. Patient alert and oriented at this time however with noted generalized weakness and expressive aphasia. Per family reports fever of 102 orally yesterday with progressing weakness. Per fire dept Blood glucose in waiting room 119.   Discussed with family at this time need for patient to be acutely transferred to emergency room. EMS reports that they will transport patient to [facility] emergency room. RN to call report. Subjective reports concerning for sepsis, possibly second to a pneumonia. Also concern for acute neurological changes including expressive aphasia, concern for acute CVA. Patient and family verbalized understanding and agreed to this plan. Patient stable at the time of transfer.  Discussed follow up with Primary care physician this week. Discussed follow up and return parameters including no resolution or any worsening concerns. Patient verbalized understanding and agreed to plan.   ____________________________________________   FINAL CLINICAL IMPRESSION(S) / ED DIAGNOSES  Final diagnoses:  Altered mental status, unspecified altered mental status type  Weakness       Marylene Land, NP 05/11/15 1831  Marylene Land, NP 05/11/15 1935

## 2015-05-11 NOTE — ED Notes (Signed)
Patient has symptoms of altered mental status and shakes.

## 2015-05-23 ENCOUNTER — Telehealth: Payer: Self-pay

## 2015-05-23 NOTE — Telephone Encounter (Signed)
FYI: Patient doing well. Did get married on Sunday. He has FU 05/28/2015 has lost hearing since hospitalization.[facility]

## 2015-05-24 NOTE — Telephone Encounter (Signed)
Noted-jh 

## 2015-05-28 ENCOUNTER — Ambulatory Visit (INDEPENDENT_AMBULATORY_CARE_PROVIDER_SITE_OTHER): Payer: Medicare Other | Admitting: Family Medicine

## 2015-05-28 ENCOUNTER — Encounter: Payer: Self-pay | Admitting: Family Medicine

## 2015-05-28 VITALS — BP 120/60 | HR 64 | Temp 97.6°F | Resp 16 | Ht 76.0 in | Wt 209.0 lb

## 2015-05-28 DIAGNOSIS — I10 Essential (primary) hypertension: Secondary | ICD-10-CM

## 2015-05-28 DIAGNOSIS — G40909 Epilepsy, unspecified, not intractable, without status epilepticus: Secondary | ICD-10-CM | POA: Diagnosis not present

## 2015-05-28 DIAGNOSIS — E782 Mixed hyperlipidemia: Secondary | ICD-10-CM | POA: Diagnosis not present

## 2015-05-28 DIAGNOSIS — N183 Chronic kidney disease, stage 3 unspecified: Secondary | ICD-10-CM

## 2015-05-28 DIAGNOSIS — I480 Paroxysmal atrial fibrillation: Secondary | ICD-10-CM | POA: Diagnosis not present

## 2015-05-28 DIAGNOSIS — G4700 Insomnia, unspecified: Secondary | ICD-10-CM

## 2015-05-28 DIAGNOSIS — R404 Transient alteration of awareness: Secondary | ICD-10-CM

## 2015-05-28 DIAGNOSIS — E118 Type 2 diabetes mellitus with unspecified complications: Secondary | ICD-10-CM | POA: Diagnosis not present

## 2015-05-28 DIAGNOSIS — I6523 Occlusion and stenosis of bilateral carotid arteries: Secondary | ICD-10-CM

## 2015-05-28 MED ORDER — TRAZODONE HCL 50 MG PO TABS: 25.0000 mg | ORAL_TABLET | Freq: Every evening | ORAL | Status: DC | PRN

## 2015-05-28 NOTE — Progress Notes (Signed)
Name: Keith Cruz   MRN: [id number]    DOB: 1940/12/16   Date:05/28/2015       Progress Note  Subjective  Chief Complaint  Chief Complaint  Patient presents with  . Hospitalization Follow-up  . Pneumonia    HPI Here for f/u of altered mental status and seizure disorder that he was hospitaized for.  Poss. Viral encephalitis or viral meningitis cause, but unproven.  Still on Seizure meds.  Needs to f/u with Neurology  No problem-specific assessment & plan notes found for this encounter.   Past Medical History  Diagnosis Date  . Hypertension   . Kidney disease   . Seasonal allergies     History reviewed. No pertinent past surgical history.  Family History  Problem Relation Age of Onset  . Stroke Mother   . Heart attack Mother   . Stroke Father   . Stroke Paternal Grandmother   . Stroke Paternal Grandfather     Social History   Social History  . Marital Status: Widowed    Spouse Name: N/A  . Number of Children: N/A  . Years of Education: N/A   Occupational History  . Not on file.   Social History Main Topics  . Smoking status: Former Smoker -- 2.00 packs/day for 10 years    Types: Cigarettes  . Smokeless tobacco: Never Used  . Alcohol Use: No     Comment: previous  . Drug Use: No  . Sexual Activity: Not on file   Other Topics Concern  . Not on file   Social History Narrative     Current outpatient prescriptions:  .  albuterol (PROVENTIL HFA;VENTOLIN HFA) 108 (90 Base) MCG/ACT inhaler, Inhale 1-2 puffs into the lungs every 4 (four) hours as needed for wheezing or shortness of breath., Disp: 1 Inhaler, Rfl: 2 .  Cholecalciferol (VITAMIN D3) 2000 UNITS capsule, Take 2,000 Units by mouth daily., Disp: , Rfl:  .  clobetasol (TEMOVATE) 0.05 % external solution, Apply 2 application topically as needed., Disp: , Rfl:  .  clopidogrel (PLAVIX) 75 MG tablet, Take 75 mg by mouth daily., Disp: , Rfl:  .  Cyanocobalamin (RA VITAMIN B-12 TR) 1000 MCG TBCR, Take 1,000  mcg by mouth daily., Disp: , Rfl:  .  fexofenadine (ALLEGRA) 180 MG tablet, Take 180 mg by mouth daily., Disp: , Rfl:  .  folic acid (FOLVITE) 1 MG tablet, Take 0.8 mg by mouth daily., Disp: , Rfl:  .  furosemide (LASIX) 20 MG tablet, Take 1 tablet (20 mg total) by mouth daily., Disp: 30 tablet, Rfl: 3 .  levETIRAcetam (KEPPRA) 250 MG tablet, Take 250 mg by mouth 2 (two) times daily., Disp: , Rfl:  .  LYCOPENE PO, Take 1 tablet by mouth daily., Disp: , Rfl:  .  metoprolol (LOPRESSOR) 50 MG tablet, Take 50 mg by mouth daily., Disp: , Rfl:  .  pantoprazole (PROTONIX) 40 MG tablet, Take 40 mg by mouth daily., Disp: , Rfl:  .  sodium chloride (OCEAN) 0.65 % SOLN nasal spray, Place 2 sprays into both nostrils every 2 (two) hours while awake., Disp: , Rfl: 0 .  triamcinolone cream (KENALOG) 0.1 %, Apply 1 application topically as needed., Disp: , Rfl:  .  simvastatin (ZOCOR) 40 MG tablet, Take 40 mg by mouth daily., Disp: , Rfl:  .  traZODone (DESYREL) 50 MG tablet, Take 0.5-1 tablets (25-50 mg total) by mouth at bedtime as needed for sleep., Disp: 30 tablet, Rfl: 3  Allergies  Allergen Reactions  . Fexofenadine-Pseudoephed Er Swelling    Tongue and face. Only with D product. Tolerates plain allegra at home.  Tongue and face. Only with D product. Tolerates plain allegra at home.   . Nifedipine Swelling    Tongue and face Tongue and face Tongue and face  . Allegra  [Fexofenadine] Swelling    Tongue and face. Only with D product. Tolerates plain allegra at home.   . Prednisone     Other reaction(s): Other (See Comments) Mental status changes Other reaction(s): Other (See Comments) Mental status changes     Review of Systems  Constitutional: Negative for fever, chills, weight loss and malaise/fatigue.  HENT: Positive for hearing loss.   Eyes: Negative for blurred vision and double vision.  Respiratory: Negative for cough, shortness of breath and wheezing.   Cardiovascular: Negative for  chest pain, palpitations and leg swelling.  Gastrointestinal: Negative for heartburn, abdominal pain and blood in stool.  Genitourinary: Negative for dysuria, urgency and frequency.  Skin: Negative for rash.  Neurological: Negative for tremors, weakness and headaches.      Objective  Filed Vitals:   05/28/15 1356 05/28/15 1448  BP: 109/59 120/60  Pulse: 64   Temp: 97.6 F (36.4 C)   TempSrc: Oral   Resp: 16   Height: 6\' 4"  (1.93 m)   Weight: 209 lb (94.802 kg)     Physical Exam  Constitutional: He is oriented to person, place, and time and well-developed, well-nourished, and in no distress. No distress.  HENT:  Head: Normocephalic and atraumatic.  Eyes: Conjunctivae and EOM are normal. Pupils are equal, round, and reactive to light. No scleral icterus.  Neck: Normal range of motion. Neck supple. Carotid bruit is not present. No thyromegaly present.  Cardiovascular: Normal rate, regular rhythm and normal heart sounds.  Exam reveals no gallop and no friction rub.   No murmur heard. Pulmonary/Chest: Effort normal and breath sounds normal. No respiratory distress. He has no wheezes. He has no rales.  Abdominal: Soft. Bowel sounds are normal. He exhibits no distension and no mass. There is no tenderness.  Musculoskeletal: He exhibits no edema.  Lymphadenopathy:    He has no cervical adenopathy.  Neurological: He is alert and oriented to person, place, and time. A cranial nerve deficit (decreased hearing bilaerally, but rest of CN are wnl.) is present.  Vitals reviewed.      No results found for this or any previous visit (from the past 2160 hour(s)).   Assessment & Plan  Problem List Items Addressed This Visit      Cardiovascular and Mediastinum   Atrial fibrillation ([place])   Benign essential HTN - Primary   Relevant Orders   Comprehensive Metabolic Panel (CMET)   Carotid artery narrowing     Endocrine   Diabetes mellitus type 2 with complications (HCC)      Nervous and Auditory   Seizure disorder ([place])   Relevant Orders   Ambulatory referral to Neurology     Genitourinary   Chronic kidney disease (CKD), stage III (moderate)   Relevant Orders   CBC with Differential     Other   Combined fat and carbohydrate induced hyperlipemia   Altered mental status   Insomnia   Relevant Medications   traZODone (DESYREL) 50 MG tablet      Meds ordered this encounter  Medications  . levETIRAcetam (KEPPRA) 250 MG tablet    Sig: Take 250 mg by mouth 2 (two) times daily.  . traZODone (DESYREL)  50 MG tablet    Sig: Take 0.5-1 tablets (25-50 mg total) by mouth at bedtime as needed for sleep.    Dispense:  30 tablet    Refill:  3   1. Benign essential HTN  - Comprehensive Metabolic Panel (CMET)  2. Type 2 diabetes mellitus with complication, without long-term current use of insulin ([place])   3. Transient alteration of awareness   4. Seizure disorder [facility])  - Ambulatory referral to Neurology  5. Paroxysmal atrial fibrillation (HCC)   6. Chronic kidney disease (CKD), stage III (moderate)  - CBC with Differential  7. Combined fat and carbohydrate induced hyperlipemia   8. Carotid artery narrowing, bilateral   9. Insomnia  - traZODone (DESYREL) 50 MG tablet; Take 0.5-1 tablets (25-50 mg total) by mouth at bedtime as needed for sleep.  Dispense: 30 tablet; Refill: 3

## 2015-06-04 ENCOUNTER — Ambulatory Visit: Payer: Medicare Other | Admitting: Family Medicine

## 2015-06-07 DIAGNOSIS — I129 Hypertensive chronic kidney disease with stage 1 through stage 4 chronic kidney disease, or unspecified chronic kidney disease: Secondary | ICD-10-CM | POA: Diagnosis not present

## 2015-06-07 DIAGNOSIS — N183 Chronic kidney disease, stage 3 (moderate): Secondary | ICD-10-CM | POA: Diagnosis not present

## 2015-06-07 DIAGNOSIS — I701 Atherosclerosis of renal artery: Secondary | ICD-10-CM | POA: Diagnosis not present

## 2015-06-08 ENCOUNTER — Telehealth: Payer: Self-pay | Admitting: Family Medicine

## 2015-06-08 DIAGNOSIS — R569 Unspecified convulsions: Secondary | ICD-10-CM

## 2015-06-08 NOTE — Telephone Encounter (Signed)
When pt left the hospital he was given levetiracetam 250 mg twice daily for seizures.  He sees Dr. Melrose Nakayama (neurologist) on the 27th but he will be out of this medication on the 19th.  Is this something Dr. Luan Pulling could refill.  Please call [phone]

## 2015-06-11 MED ORDER — LEVETIRACETAM 250 MG PO TABS: 250.0000 mg | ORAL_TABLET | Freq: Two times a day (BID) | ORAL | Status: DC

## 2015-06-11 NOTE — Telephone Encounter (Signed)
Please review and let me know if I need to refill these.

## 2015-06-11 NOTE — Telephone Encounter (Signed)
  We can send in enough of this medication for 8 days (#16).  He must get further refills from Neuro.-jh

## 2015-06-15 DIAGNOSIS — H2513 Age-related nuclear cataract, bilateral: Secondary | ICD-10-CM | POA: Diagnosis not present

## 2015-06-15 DIAGNOSIS — H353131 Nonexudative age-related macular degeneration, bilateral, early dry stage: Secondary | ICD-10-CM | POA: Diagnosis not present

## 2015-06-25 DIAGNOSIS — R569 Unspecified convulsions: Secondary | ICD-10-CM | POA: Diagnosis not present

## 2015-07-12 DIAGNOSIS — E782 Mixed hyperlipidemia: Secondary | ICD-10-CM | POA: Diagnosis not present

## 2015-07-12 DIAGNOSIS — I6523 Occlusion and stenosis of bilateral carotid arteries: Secondary | ICD-10-CM | POA: Diagnosis not present

## 2015-07-12 DIAGNOSIS — I700 Atherosclerosis of aorta: Secondary | ICD-10-CM | POA: Diagnosis not present

## 2015-07-12 DIAGNOSIS — I2510 Atherosclerotic heart disease of native coronary artery without angina pectoris: Secondary | ICD-10-CM | POA: Diagnosis not present

## 2015-07-12 DIAGNOSIS — I9789 Other postprocedural complications and disorders of the circulatory system, not elsewhere classified: Secondary | ICD-10-CM | POA: Diagnosis not present

## 2015-07-17 ENCOUNTER — Ambulatory Visit (INDEPENDENT_AMBULATORY_CARE_PROVIDER_SITE_OTHER): Payer: Medicare Other | Admitting: Family Medicine

## 2015-07-17 ENCOUNTER — Encounter: Payer: Self-pay | Admitting: Family Medicine

## 2015-07-17 ENCOUNTER — Ambulatory Visit
Admission: RE | Admit: 2015-07-17 | Discharge: 2015-07-17 | Disposition: A | Discharge status: Home / Self Care | Payer: Medicare Other | Source: Ambulatory Visit | Attending: Family Medicine | Admitting: Family Medicine

## 2015-07-17 VITALS — BP 146/62 | HR 84 | Temp 100.3°F | Resp 16 | Ht 76.0 in | Wt 211.8 lb

## 2015-07-17 DIAGNOSIS — R6889 Other general symptoms and signs: Secondary | ICD-10-CM

## 2015-07-17 DIAGNOSIS — J101 Influenza due to other identified influenza virus with other respiratory manifestations: Secondary | ICD-10-CM | POA: Diagnosis not present

## 2015-07-17 DIAGNOSIS — R059 Cough, unspecified: Secondary | ICD-10-CM

## 2015-07-17 DIAGNOSIS — R05 Cough: Secondary | ICD-10-CM

## 2015-07-17 LAB — POCT INFLUENZA A/B
Influenza A, POC: POSITIVE — AB
Influenza B, POC: NEGATIVE

## 2015-07-17 MED ORDER — DM-GUAIFENESIN ER 30-600 MG PO TB12: 1.0000 | ORAL_TABLET | Freq: Two times a day (BID) | ORAL | Status: DC

## 2015-07-17 MED ORDER — BENZONATATE 100 MG PO CAPS: 100.0000 mg | ORAL_CAPSULE | Freq: Three times a day (TID) | ORAL | Status: DC | PRN

## 2015-07-17 MED ORDER — OSELTAMIVIR PHOSPHATE 75 MG PO CAPS: 75.0000 mg | ORAL_CAPSULE | Freq: Two times a day (BID) | ORAL | Status: DC

## 2015-07-17 NOTE — Patient Instructions (Addendum)
We will do a chest XR to ensure you do not have pneumonia. IF you develop trouble breathing, chest pain, severe nausea or vomiting- Don't hesitate to go to the ER.    Influenza, Adult Influenza ("the flu") is a viral infection of the respiratory tract. It occurs more often in winter months because people spend more time in close contact with one another. Influenza can make you feel very sick. Influenza easily spreads from person to person (contagious). CAUSES  Influenza is caused by a virus that infects the respiratory tract. You can catch the virus by breathing in droplets from an infected person's cough or sneeze. You can also catch the virus by touching something that was recently contaminated with the virus and then touching your mouth, nose, or eyes. RISKS AND COMPLICATIONS You may be at risk for a more severe case of influenza if you smoke cigarettes, have diabetes, have chronic heart disease (such as heart failure) or lung disease (such as asthma), or if you have a weakened immune system. Elderly people and pregnant women are also at risk for more serious infections. The most common problem of influenza is a lung infection (pneumonia). Sometimes, this problem can require emergency medical care and may be life threatening. SIGNS AND SYMPTOMS  Symptoms typically last 4 to 10 days and may include:  Fever.  Chills.  Headache, body aches, and muscle aches.  Sore throat.  Chest discomfort and cough.  Poor appetite.  Weakness or feeling tired.  Dizziness.  Nausea or vomiting. DIAGNOSIS  Diagnosis of influenza is often made based on your history and a physical exam. A nose or throat swab test can be done to confirm the diagnosis. TREATMENT  In mild cases, influenza goes away on its own. Treatment is directed at relieving symptoms. For more severe cases, your health care provider may prescribe antiviral medicines to shorten the sickness. Antibiotic medicines are not effective because  the infection is caused by a virus, not by bacteria. HOME CARE INSTRUCTIONS  Take medicines only as directed by your health care provider.  Use a cool mist humidifier to make breathing easier.  Get plenty of rest until your temperature returns to normal. This usually takes 3 to 4 days.  Drink enough fluid to keep your urine clear or pale yellow.  Cover yourmouth and nosewhen coughing or sneezing,and wash your handswellto prevent thevirusfrom spreading.  Stay homefromwork orschool untilthe fever is gonefor at least 84full day. PREVENTION  An annual influenza vaccination (flu shot) is the best way to avoid getting influenza. An annual flu shot is now routinely recommended for all adults in the [place] IF:  You experiencechest pain, yourcough worsens,or you producemore mucus.  Youhave nausea,vomiting, ordiarrhea.  Your fever returns or gets worse. SEEK IMMEDIATE MEDICAL CARE IF:  You havetrouble breathing, you become short of breath,or your skin ornails becomebluish.  You have severe painor stiffnessin the neck.  You develop a sudden headache, or pain in the face or ear.  You have nausea or vomiting that you cannot control. MAKE SURE YOU:   Understand these instructions.  Will watch your condition.  Will get help right away if you are not doing well or get worse.   This information is not intended to replace advice given to you by your health care provider. Make sure you discuss any questions you have with your health care provider.   Document Released: 04/11/2000 Document Revised: 05/05/2014 Document Reviewed: 07/14/2011 Elsevier Interactive Patient Education [employer].

## 2015-07-17 NOTE — Progress Notes (Signed)
Subjective:    Patient ID: Keith Cruz, male    DOB: [birth year], 75 y.o.   MRN: [id number]  HPI: Keith Cruz is a 75 y.o. male presenting on 07/17/2015 for Fever   HPI  Pt presents for flu like symptoms. Symptoms started on Sunday- fever, body aches, weakness, nausea. T-max 101.9. First symptom was chest congestion and throat congestion. Some nasal congestion- helped by mucinex. Cough is productive- yellow sputum. Some chest tightness, but no trouble breathing. Sick contacts- recent visit to [facility]. Small grandchildren. Wife has COPD. No ear pain. Some sinus pressure.  Running fever round the clock since Sunday.  Did get flu shot this year.  Past Medical History  Diagnosis Date  . Hypertension   . Kidney disease   . Seasonal allergies     Current Outpatient Prescriptions on File Prior to Visit  Medication Sig  . albuterol (PROVENTIL HFA;VENTOLIN HFA) 108 (90 Base) MCG/ACT inhaler Inhale 1-2 puffs into the lungs every 4 (four) hours as needed for wheezing or shortness of breath.  . Cholecalciferol (VITAMIN D3) 2000 UNITS capsule Take 2,000 Units by mouth daily.  . clobetasol (TEMOVATE) 0.05 % external solution Apply 2 application topically as needed.  . Cyanocobalamin (RA VITAMIN B-12 TR) 1000 MCG TBCR Take 1,000 mcg by mouth daily.  . fexofenadine (ALLEGRA) 180 MG tablet Take 180 mg by mouth daily.  . folic acid (FOLVITE) 1 MG tablet Take 0.8 mg by mouth daily.  . furosemide (LASIX) 20 MG tablet Take 1 tablet (20 mg total) by mouth daily.  Marland Kitchen levETIRAcetam (KEPPRA) 250 MG tablet Take 250 mg by mouth 2 (two) times daily.  Marland Kitchen levETIRAcetam (KEPPRA) 250 MG tablet Take 1 tablet (250 mg total) by mouth 2 (two) times daily.  Marland Kitchen LYCOPENE PO Take 1 tablet by mouth daily.  . metoprolol (LOPRESSOR) 50 MG tablet Take 50 mg by mouth daily.  . pantoprazole (PROTONIX) 40 MG tablet Take 40 mg by mouth daily.  . sodium chloride (OCEAN) 0.65 % SOLN nasal spray Place 2 sprays into both  nostrils every 2 (two) hours while awake.  . traZODone (DESYREL) 50 MG tablet Take 0.5-1 tablets (25-50 mg total) by mouth at bedtime as needed for sleep.  Marland Kitchen triamcinolone cream (KENALOG) 0.1 % Apply 1 application topically as needed.  . simvastatin (ZOCOR) 40 MG tablet Take 40 mg by mouth daily.   No current facility-administered medications on file prior to visit.    Review of Systems  Constitutional: Positive for fever, chills and fatigue.  HENT: Positive for congestion and rhinorrhea. Negative for ear discharge, ear pain, sinus pressure, sore throat and trouble swallowing.   Respiratory: Positive for cough and chest tightness. Negative for choking, shortness of breath and wheezing.   Cardiovascular: Negative for chest pain and palpitations.  Gastrointestinal: Positive for nausea. Negative for vomiting, abdominal pain and diarrhea.  Musculoskeletal: Positive for myalgias. Negative for neck pain and neck stiffness.  Skin: Negative for rash.  Neurological: Positive for headaches. Negative for dizziness, syncope and weakness.   Per HPI unless specifically indicated above     Objective:    BP 146/62 mmHg  Pulse 84  Temp(Src) 100.3 F (37.9 C) (Oral)  Resp 16  Ht 6\' 4"  (1.93 m)  Wt 211 lb 12.8 oz (96.072 kg)  BMI 25.79 kg/m2  SpO2 95%  Wt Readings from Last 3 Encounters:  07/17/15 211 lb 12.8 oz (96.072 kg)  05/28/15 209 lb (94.802 kg)  05/09/15 220 lb (99.791 kg)  Physical Exam  Constitutional: He is oriented to person, place, and time. He appears well-developed and well-nourished. He appears ill. No distress.  HENT:  Head: Normocephalic and atraumatic.  Right Ear: Hearing normal.  Left Ear: A middle ear effusion is present.  Nose: Mucosal edema and rhinorrhea present. Right sinus exhibits maxillary sinus tenderness. Right sinus exhibits no frontal sinus tenderness. Left sinus exhibits maxillary sinus tenderness. Left sinus exhibits no frontal sinus tenderness.    Mouth/Throat: Uvula is midline, oropharynx is clear and moist and mucous membranes are normal.  Neck: Normal range of motion. Neck supple.  Cardiovascular: Normal rate and regular rhythm.  Exam reveals no gallop and no friction rub.   No murmur heard. Pulmonary/Chest: No respiratory distress. He has no decreased breath sounds. He has no wheezes. He has no rhonchi. He has no rales. Chest wall is not dull to percussion. He exhibits no tenderness.  Abdominal: Soft. Bowel sounds are normal. There is no tenderness. There is no rebound.  Musculoskeletal: Normal range of motion. He exhibits no edema.  Neurological: He is alert and oriented to person, place, and time.  Skin: Skin is warm and dry. No erythema. No pallor.  Psychiatric: He has a normal mood and affect. Judgment normal.   Results for orders placed or performed in visit on 07/17/15  POCT Influenza A/B  Result Value Ref Range   Influenza A, POC Positive (A) Negative   Influenza B, POC Negative Negative      Assessment & Plan:   Problem List Items Addressed This Visit    None    Visit Diagnoses    Influenza A    -  Primary    +Flu A. Start tamiflu since right at 48 hour window. Supportive care at home for cough. tylenol PRN for fever. Alarm symptoms reviewed. CXR given chest tightness. Discussed flu prophylaxis with wife. She will call her provider. Return if symptoms are not improving.     Relevant Medications    oseltamivir (TAMIFLU) 75 MG capsule    dextromethorphan-guaiFENesin (MUCINEX DM) 30-600 MG 12hr tablet    Other Relevant Orders    POCT Influenza A/B (Completed)    DG Chest 2 View    Cough        CXR to r/o pneumonia given chest tightness and recent pneumonia. Plan to treat for DRSP if positive given age and recent abx. Supportive care for cough. Alarm symptoms reviewed.     Relevant Orders    DG Chest 2 View       Meds ordered this encounter  Medications  . fluticasone (FLONASE) 50 MCG/ACT nasal spray    Sig:  SPRAY TWICE IN EACH NOSTRIL DAILY    Refill:  4  . oseltamivir (TAMIFLU) 75 MG capsule    Sig: Take 1 capsule (75 mg total) by mouth 2 (two) times daily.    Dispense:  10 capsule    Refill:  0    Order Specific Question:  Supervising Provider    Answer:  Arlis Porta [phone]  . dextromethorphan-guaiFENesin (MUCINEX DM) 30-600 MG 12hr tablet    Sig: Take 1 tablet by mouth 2 (two) times daily.    Dispense:  20 tablet    Refill:  0    Order Specific Question:  Supervising Provider    Answer:  Arlis Porta [phone]  . benzonatate (TESSALON) 100 MG capsule    Sig: Take 1 capsule (100 mg total) by mouth 3 (three) times daily as needed.  Dispense:  30 capsule    Refill:  0    Order Specific Question:  Supervising Provider    Answer:  Arlis Porta [phone]      Follow up plan: Return if symptoms worsen or fail to improve.

## 2015-07-24 DIAGNOSIS — R569 Unspecified convulsions: Secondary | ICD-10-CM | POA: Diagnosis not present

## 2015-07-30 DIAGNOSIS — R569 Unspecified convulsions: Secondary | ICD-10-CM | POA: Diagnosis not present

## 2015-08-23 ENCOUNTER — Ambulatory Visit (INDEPENDENT_AMBULATORY_CARE_PROVIDER_SITE_OTHER): Payer: Medicare Other | Admitting: Family Medicine

## 2015-08-23 ENCOUNTER — Encounter: Payer: Self-pay | Admitting: Family Medicine

## 2015-08-23 VITALS — BP 130/70 | HR 56 | Temp 97.9°F | Resp 16 | Ht 75.0 in | Wt 213.2 lb

## 2015-08-23 DIAGNOSIS — E118 Type 2 diabetes mellitus with unspecified complications: Secondary | ICD-10-CM | POA: Diagnosis not present

## 2015-08-23 DIAGNOSIS — N183 Chronic kidney disease, stage 3 unspecified: Secondary | ICD-10-CM

## 2015-08-23 DIAGNOSIS — K219 Gastro-esophageal reflux disease without esophagitis: Secondary | ICD-10-CM | POA: Diagnosis not present

## 2015-08-23 DIAGNOSIS — I10 Essential (primary) hypertension: Secondary | ICD-10-CM

## 2015-08-23 DIAGNOSIS — E782 Mixed hyperlipidemia: Secondary | ICD-10-CM | POA: Diagnosis not present

## 2015-08-23 DIAGNOSIS — I2510 Atherosclerotic heart disease of native coronary artery without angina pectoris: Secondary | ICD-10-CM | POA: Diagnosis not present

## 2015-08-23 DIAGNOSIS — E119 Type 2 diabetes mellitus without complications: Secondary | ICD-10-CM

## 2015-08-23 DIAGNOSIS — N529 Male erectile dysfunction, unspecified: Secondary | ICD-10-CM

## 2015-08-23 LAB — POCT GLYCOSYLATED HEMOGLOBIN (HGB A1C): Hemoglobin A1C: 6.1

## 2015-08-23 MED ORDER — SILDENAFIL CITRATE 50 MG PO TABS: 50.0000 mg | ORAL_TABLET | Freq: Every day | ORAL | Status: DC | PRN

## 2015-08-23 NOTE — Progress Notes (Signed)
Name: Keith Cruz   MRN: [id number]    DOB: 09-19-1940   Date:08/23/2015       Progress Note  Subjective  Chief Complaint  Chief Complaint  Patient presents with  . Follow-up    Influenza    HPI Here for f/u of HBP, ASCVD, DM (diet controlled).  His BSs at home run 100-110.  He feels well.   C/o ED and asks about Viagra.  C/o daily indigestion even on Protonix  No problem-specific assessment & plan notes found for this encounter.   Past Medical History  Diagnosis Date  . Hypertension   . Kidney disease   . Seasonal allergies   . Myocardial infarction [facility])     Past Surgical History  Procedure Laterality Date  . Coronary artery bypass graft      Family History  Problem Relation Age of Onset  . Stroke Mother   . Heart attack Mother   . Stroke Father   . Stroke Paternal Grandmother   . Stroke Paternal Grandfather     Social History   Social History  . Marital Status: Widowed    Spouse Name: N/A  . Number of Children: N/A  . Years of Education: N/A   Occupational History  . Not on file.   Social History Main Topics  . Smoking status: Former Smoker -- 2.00 packs/day for 10 years    Types: Cigarettes  . Smokeless tobacco: Never Used  . Alcohol Use: No     Comment: previous  . Drug Use: No  . Sexual Activity: Not on file   Other Topics Concern  . Not on file   Social History Narrative     Current outpatient prescriptions:  .  albuterol (PROVENTIL HFA;VENTOLIN HFA) 108 (90 Base) MCG/ACT inhaler, Inhale 1-2 puffs into the lungs every 4 (four) hours as needed for wheezing or shortness of breath., Disp: 1 Inhaler, Rfl: 2 .  Cholecalciferol (VITAMIN D3) 2000 UNITS capsule, Take 2,000 Units by mouth daily., Disp: , Rfl:  .  clobetasol (TEMOVATE) 0.05 % external solution, Apply 2 application topically as needed., Disp: , Rfl:  .  clopidogrel (PLAVIX) 75 MG tablet, Take 75 mg by mouth daily., Disp: , Rfl:  .  fexofenadine (ALLEGRA) 180 MG tablet, Take 180 mg  by mouth daily., Disp: , Rfl:  .  fluticasone (FLONASE) 50 MCG/ACT nasal spray, SPRAY TWICE IN EACH NOSTRIL DAILY, Disp: , Rfl: 4 .  folic acid (FOLVITE) 1 MG tablet, Take 0.8 mg by mouth daily., Disp: , Rfl:  .  metoprolol (LOPRESSOR) 50 MG tablet, Take 50 mg by mouth daily., Disp: , Rfl:  .  pantoprazole (PROTONIX) 40 MG tablet, Take 40 mg by mouth daily., Disp: , Rfl:  .  sodium chloride (OCEAN) 0.65 % SOLN nasal spray, Place 2 sprays into both nostrils every 2 (two) hours while awake., Disp: , Rfl: 0 .  traZODone (DESYREL) 50 MG tablet, Take 0.5-1 tablets (25-50 mg total) by mouth at bedtime as needed for sleep., Disp: 30 tablet, Rfl: 3 .  triamcinolone cream (KENALOG) 0.1 %, Apply 1 application topically as needed., Disp: , Rfl:  .  Cyanocobalamin (RA VITAMIN B-12 TR) 1000 MCG TBCR, Take 1,000 mcg by mouth daily. Reported on 08/23/2015, Disp: , Rfl:  .  furosemide (LASIX) 20 MG tablet, Take 1 tablet (20 mg total) by mouth daily. (Patient not taking: Reported on 08/23/2015), Disp: 30 tablet, Rfl: 3 .  levETIRAcetam (KEPPRA) 250 MG tablet, Take 250 mg by mouth  2 (two) times daily. Reported on 08/23/2015, Disp: , Rfl:  .  levETIRAcetam (KEPPRA) 250 MG tablet, Take 1 tablet (250 mg total) by mouth 2 (two) times daily. (Patient not taking: Reported on 08/23/2015), Disp: 16 tablet, Rfl: 0 .  LYCOPENE PO, Take 1 tablet by mouth daily. Reported on 08/23/2015, Disp: , Rfl:  .  oseltamivir (TAMIFLU) 75 MG capsule, Take 1 capsule (75 mg total) by mouth 2 (two) times daily. (Patient not taking: Reported on 08/23/2015), Disp: 10 capsule, Rfl: 0 .  sildenafil (VIAGRA) 50 MG tablet, Take 1 tablet (50 mg total) by mouth daily as needed for erectile dysfunction., Disp: 6 tablet, Rfl: 12 .  simvastatin (ZOCOR) 40 MG tablet, Take 40 mg by mouth daily., Disp: , Rfl:   Allergies  Allergen Reactions  . Fexofenadine-Pseudoephed Er Swelling    Tongue and face. Only with D product. Tolerates plain allegra at home.   Tongue and face. Only with D product. Tolerates plain allegra at home.   . Nifedipine Swelling    Tongue and face Tongue and face Tongue and face  . Allegra  [Fexofenadine] Swelling    Tongue and face. Only with D product. Tolerates plain allegra at home.   . Prednisone     Other reaction(s): Other (See Comments) Mental status changes Other reaction(s): Other (See Comments) Mental status changes     Review of Systems  Constitutional: Negative for fever, chills, weight loss and malaise/fatigue.  HENT: Negative for hearing loss.   Eyes: Negative for blurred vision and double vision.  Respiratory: Negative for cough, shortness of breath and wheezing.   Cardiovascular: Negative for chest pain, palpitations and leg swelling.  Gastrointestinal: Negative for heartburn, abdominal pain and blood in stool.  Genitourinary: Negative for dysuria, urgency and frequency.       C/o ED  Musculoskeletal: Negative for myalgias and joint pain.  Skin: Negative for rash.  Neurological: Negative for dizziness, tremors, weakness and headaches.  Psychiatric/Behavioral: Negative for depression. The patient has insomnia. The patient is not nervous/anxious.       Objective  Filed Vitals:   08/23/15 0809 08/23/15 0904  BP: 142/68 130/70  Pulse: 53 56  Temp: 97.9 F (36.6 C)   TempSrc: Oral   Resp: 16   Height: 6\' 3"  (1.905 m)   Weight: 213 lb 3.2 oz (96.707 kg)   SpO2: 97%     Physical Exam  Constitutional: He is oriented to person, place, and time and well-developed, well-nourished, and in no distress.  HENT:  Head: Normocephalic and atraumatic.  Eyes: Conjunctivae and EOM are normal. Pupils are equal, round, and reactive to light. No scleral icterus.  Neck: Normal range of motion. Neck supple. Carotid bruit is not present. No thyromegaly present.  Cardiovascular: Normal rate, regular rhythm and normal heart sounds.  Exam reveals no gallop and no friction rub.   No murmur  heard. Pulmonary/Chest: Effort normal and breath sounds normal. No respiratory distress. He has no wheezes. He has no rales.  Abdominal: Soft. Bowel sounds are normal. He exhibits no distension, no abdominal bruit and no mass. There is no tenderness.  Musculoskeletal: He exhibits no edema.  Lymphadenopathy:    He has no cervical adenopathy.  Neurological: He is alert and oriented to person, place, and time.  Vitals reviewed.      Recent Results (from the past 2160 hour(s))  POCT Influenza A/B     Status: Abnormal   Collection Time: 07/17/15 10:05 AM  Result Value Ref Range  Influenza A, POC Positive (A) Negative   Influenza B, POC Negative Negative  POCT HgB A1C     Status: Abnormal   Collection Time: 08/23/15  8:37 AM  Result Value Ref Range   Hemoglobin A1C 6.1%      Assessment & Plan  Problem List Items Addressed This Visit      Cardiovascular and Mediastinum   Benign essential HTN   Relevant Medications   sildenafil (VIAGRA) 50 MG tablet   Other Relevant Orders   Comprehensive Metabolic Panel (CMET)   Arteriosclerosis of coronary artery   Relevant Medications   sildenafil (VIAGRA) 50 MG tablet     Digestive   Acid reflux     Endocrine   Diabetes mellitus type 2 with complications (HCC)     Genitourinary   Chronic kidney disease (CKD), stage III (moderate)   Relevant Orders   CBC with Differential   ED (erectile dysfunction)   Relevant Medications   sildenafil (VIAGRA) 50 MG tablet     Other   Combined fat and carbohydrate induced hyperlipemia   Relevant Medications   sildenafil (VIAGRA) 50 MG tablet   Other Relevant Orders   Lipid Profile    Other Visit Diagnoses    Type 2 diabetes mellitus without complication, without long-term current use of insulin (HCC)    -  Primary    Relevant Orders    POCT HgB A1C (Completed)       Meds ordered this encounter  Medications  . clopidogrel (PLAVIX) 75 MG tablet    Sig: Take 75 mg by mouth daily.  .  sildenafil (VIAGRA) 50 MG tablet    Sig: Take 1 tablet (50 mg total) by mouth daily as needed for erectile dysfunction.    Dispense:  6 tablet    Refill:  12  1. Type 2 diabetes mellitus without complication, without long-term current use of insulin (HCC)  - POCT HgB A1C-6.1  2. Benign essential HTN RTC-6 mos. - Comprehensive Metabolic Panel (CMET)  3. Arteriosclerosis of coronary artery   4. Gastroesophageal reflux disease without esophagitis   5. Type 2 diabetes mellitus with complication, without long-term current use of insulin ([place])   6. Chronic kidney disease (CKD), stage III (moderate)  - CBC with Differential  7. Combined fat and carbohydrate induced hyperlipemia  - Lipid Profile  8. Erectile dysfunction, unspecified erectile dysfunction type  - sildenafil (VIAGRA) 50 MG tablet; Take 1 tablet (50 mg total) by mouth daily as needed for erectile dysfunction.  Dispense: 6 tablet; Refill: 12  Cont. All current meds.

## 2015-08-24 LAB — CBC WITH DIFFERENTIAL/PLATELET
Basophils Absolute: 0 10*3/uL (ref 0.0–0.2)
Basos: 1 %
EOS (ABSOLUTE): 0.3 10*3/uL (ref 0.0–0.4)
Eos: 4 %
Hematocrit: 42.3 % (ref 37.5–51.0)
Hemoglobin: 14.5 g/dL (ref 12.6–17.7)
Immature Grans (Abs): 0 10*3/uL (ref 0.0–0.1)
Immature Granulocytes: 0 %
Lymphocytes Absolute: 2 10*3/uL (ref 0.7–3.1)
Lymphs: 29 %
MCH: 33.8 pg — ABNORMAL HIGH (ref 26.6–33.0)
MCHC: 34.3 g/dL (ref 31.5–35.7)
MCV: 99 fL — ABNORMAL HIGH (ref 79–97)
Monocytes Absolute: 0.5 10*3/uL (ref 0.1–0.9)
Monocytes: 8 %
Neutrophils Absolute: 4.1 10*3/uL (ref 1.4–7.0)
Neutrophils: 58 %
Platelets: 241 10*3/uL (ref 150–379)
RBC: 4.29 x10E6/uL (ref 4.14–5.80)
RDW: 13.5 % (ref 12.3–15.4)
WBC: 7 10*3/uL (ref 3.4–10.8)

## 2015-08-24 LAB — COMPREHENSIVE METABOLIC PANEL
ALT: 25 IU/L (ref 0–44)
AST: 26 IU/L (ref 0–40)
Albumin/Globulin Ratio: 1.4 (ref 1.2–2.2)
Albumin: 4.2 g/dL (ref 3.5–4.8)
Alkaline Phosphatase: 54 IU/L (ref 39–117)
BUN/Creatinine Ratio: 14 (ref 10–24)
BUN: 19 mg/dL (ref 8–27)
Bilirubin Total: 0.5 mg/dL (ref 0.0–1.2)
CO2: 25 mmol/L (ref 18–29)
Calcium: 9.1 mg/dL (ref 8.6–10.2)
Chloride: 101 mmol/L (ref 96–106)
Creatinine, Ser: 1.36 mg/dL — ABNORMAL HIGH (ref 0.76–1.27)
GFR calc Af Amer: 58 mL/min/{1.73_m2} — ABNORMAL LOW (ref >59)
GFR calc non Af Amer: 51 mL/min/{1.73_m2} — ABNORMAL LOW (ref >59)
Globulin, Total: 2.9 g/dL (ref 1.5–4.5)
Glucose: 113 mg/dL — ABNORMAL HIGH (ref 65–99)
Potassium: 4.7 mmol/L (ref 3.5–5.2)
Sodium: 140 mmol/L (ref 134–144)
Total Protein: 7.1 g/dL (ref 6.0–8.5)

## 2015-08-24 LAB — LIPID PANEL
Chol/HDL Ratio: 3.5 ratio units (ref 0.0–5.0)
Cholesterol, Total: 142 mg/dL (ref 100–199)
HDL: 41 mg/dL (ref >39)
LDL Calculated: 80 mg/dL (ref 0–99)
Triglycerides: 104 mg/dL (ref 0–149)
VLDL Cholesterol Cal: 21 mg/dL (ref 5–40)

## 2015-09-17 ENCOUNTER — Ambulatory Visit (INDEPENDENT_AMBULATORY_CARE_PROVIDER_SITE_OTHER): Payer: Medicare Other | Admitting: Family Medicine

## 2015-09-17 VITALS — BP 150/72 | HR 73 | Temp 99.4°F | Resp 16 | Ht 75.0 in | Wt 207.0 lb

## 2015-09-17 DIAGNOSIS — J40 Bronchitis, not specified as acute or chronic: Secondary | ICD-10-CM

## 2015-09-17 DIAGNOSIS — J0100 Acute maxillary sinusitis, unspecified: Secondary | ICD-10-CM

## 2015-09-17 MED ORDER — BENZONATATE 100 MG PO CAPS: 100.0000 mg | ORAL_CAPSULE | Freq: Three times a day (TID) | ORAL | Status: DC | PRN

## 2015-09-17 MED ORDER — DOXYCYCLINE HYCLATE 100 MG PO TABS: 100.0000 mg | ORAL_TABLET | Freq: Two times a day (BID) | ORAL | Status: DC

## 2015-09-17 MED ORDER — FLUTICASONE PROPIONATE HFA 110 MCG/ACT IN AERO: 2.0000 | INHALATION_SPRAY | Freq: Two times a day (BID) | RESPIRATORY_TRACT | Status: DC

## 2015-09-17 NOTE — Progress Notes (Signed)
Subjective:    Patient ID: Keith Cruz, male    DOB: [birth year], 75 y.o.   MRN: [id number]  HPI: Keith Cruz is a 75 y.o. male presenting on 09/17/2015 for Fever and Cough   HPI  Pt presents for cough, congestion, fever, nasal congestion, chills since Saturday. Tmax 101.8. Currently 99.4. Treating fever with ibuprofen and acetaminophen rotating. Treating cough with mucinex DM at home. Chest is tight- coughing up small amount green sputum on Saturday. Has stopped.  Mild relief with mucinex and tessalon. L sided facial pain. Using rescue inhaler two times for shortness of breath. Pt does not tolerate oral prednisone, it makes him jittery- he can take inhaled steroids.   Past Medical History  Diagnosis Date  . Hypertension   . Kidney disease   . Seasonal allergies   . Myocardial infarction [facility])     Current Outpatient Prescriptions on File Prior to Visit  Medication Sig  . albuterol (PROVENTIL HFA;VENTOLIN HFA) 108 (90 Base) MCG/ACT inhaler Inhale 1-2 puffs into the lungs every 4 (four) hours as needed for wheezing or shortness of breath.  . Cholecalciferol (VITAMIN D3) 2000 UNITS capsule Take 2,000 Units by mouth daily.  . clobetasol (TEMOVATE) 0.05 % external solution Apply 2 application topically as needed.  . clopidogrel (PLAVIX) 75 MG tablet Take 75 mg by mouth daily.  . Cyanocobalamin (RA VITAMIN B-12 TR) 1000 MCG TBCR Take 1,000 mcg by mouth daily. Reported on 08/23/2015  . fexofenadine (ALLEGRA) 180 MG tablet Take 180 mg by mouth daily.  . fluticasone (FLONASE) 50 MCG/ACT nasal spray SPRAY TWICE IN EACH NOSTRIL DAILY  . folic acid (FOLVITE) 1 MG tablet Take 0.8 mg by mouth daily.  . furosemide (LASIX) 20 MG tablet Take 1 tablet (20 mg total) by mouth daily.  Marland Kitchen levETIRAcetam (KEPPRA) 250 MG tablet Take 250 mg by mouth 2 (two) times daily. Reported on 08/23/2015  . levETIRAcetam (KEPPRA) 250 MG tablet Take 1 tablet (250 mg total) by mouth 2 (two) times daily.  Marland Kitchen LYCOPENE PO  Take 1 tablet by mouth daily. Reported on 08/23/2015  . metoprolol (LOPRESSOR) 50 MG tablet Take 50 mg by mouth daily.  Marland Kitchen oseltamivir (TAMIFLU) 75 MG capsule Take 1 capsule (75 mg total) by mouth 2 (two) times daily.  . pantoprazole (PROTONIX) 40 MG tablet Take 40 mg by mouth daily.  . sildenafil (VIAGRA) 50 MG tablet Take 1 tablet (50 mg total) by mouth daily as needed for erectile dysfunction.  . sodium chloride (OCEAN) 0.65 % SOLN nasal spray Place 2 sprays into both nostrils every 2 (two) hours while awake.  . traZODone (DESYREL) 50 MG tablet Take 0.5-1 tablets (25-50 mg total) by mouth at bedtime as needed for sleep.  Marland Kitchen triamcinolone cream (KENALOG) 0.1 % Apply 1 application topically as needed.  . simvastatin (ZOCOR) 40 MG tablet Take 40 mg by mouth daily.   No current facility-administered medications on file prior to visit.    Review of Systems  Constitutional: Positive for fever and chills.  HENT: Positive for congestion, ear pain, rhinorrhea, sinus pressure and sneezing. Negative for postnasal drip.   Respiratory: Positive for cough and chest tightness. Negative for shortness of breath and wheezing.   Cardiovascular: Negative for chest pain, palpitations and leg swelling.  Skin: Negative.   Allergic/Immunologic: Negative for environmental allergies.  Neurological: Negative for headaches.   Per HPI unless specifically indicated above     Objective:    BP 150/72 mmHg  Pulse 73  Temp(Src) 99.4 F (37.4 C) (Oral)  Resp 16  Ht 6\' 3"  (1.905 m)  Wt 207 lb (93.895 kg)  BMI 25.87 kg/m2  SpO2 95%  Wt Readings from Last 3 Encounters:  09/17/15 207 lb (93.895 kg)  08/23/15 213 lb 3.2 oz (96.707 kg)  07/17/15 211 lb 12.8 oz (96.072 kg)    Physical Exam  Constitutional: He appears well-developed and well-nourished. No distress.  HENT:  Head: Normocephalic and atraumatic.  Right Ear: Hearing normal. Tympanic membrane is not erythematous and not bulging.  Left Ear: Hearing  normal. Tympanic membrane is scarred. Tympanic membrane is not erythematous and not bulging.  Nose: Mucosal edema, rhinorrhea and nasal septal hematoma present. No sinus tenderness. Right sinus exhibits maxillary sinus tenderness and frontal sinus tenderness. Left sinus exhibits no maxillary sinus tenderness and no frontal sinus tenderness.  Mouth/Throat: Uvula is midline and mucous membranes are normal. No uvula swelling. Posterior oropharyngeal erythema present. No posterior oropharyngeal edema.  Neck: Neck supple. No Brudzinski's sign and no Kernig's sign noted.  Cardiovascular: Normal rate, regular rhythm and normal heart sounds.   Pulmonary/Chest: No accessory muscle usage. No tachypnea. No respiratory distress. He has no decreased breath sounds. He has no wheezes. He has no rhonchi. He has no rales. Chest wall is not dull to percussion. He exhibits no tenderness.  Bronchial breath sounds.   Lymphadenopathy:    He has no cervical adenopathy.   Results for orders placed or performed in visit on 08/23/15  CBC with Differential  Result Value Ref Range   WBC 7.0 3.4 - 10.8 x10E3/uL   RBC 4.29 4.14 - 5.80 x10E6/uL   Hemoglobin 14.5 12.6 - 17.7 g/dL   Hematocrit 42.3 37.5 - 51.0 %   MCV 99 (H) 79 - 97 fL   MCH 33.8 (H) 26.6 - 33.0 pg   MCHC 34.3 31.5 - 35.7 g/dL   RDW 13.5 12.3 - 15.4 %   Platelets 241 150 - 379 x10E3/uL   Neutrophils 58 %   Lymphs 29 %   Monocytes 8 %   Eos 4 %   Basos 1 %   Neutrophils Absolute 4.1 1.4 - 7.0 x10E3/uL   Lymphocytes Absolute 2.0 0.7 - 3.1 x10E3/uL   Monocytes Absolute 0.5 0.1 - 0.9 x10E3/uL   EOS (ABSOLUTE) 0.3 0.0 - 0.4 x10E3/uL   Basophils Absolute 0.0 0.0 - 0.2 x10E3/uL   Immature Granulocytes 0 %   Immature Grans (Abs) 0.0 0.0 - 0.1 x10E3/uL  Comprehensive Metabolic Panel (CMET)  Result Value Ref Range   Glucose 113 (H) 65 - 99 mg/dL   BUN 19 8 - 27 mg/dL   Creatinine, Ser 1.36 (H) 0.76 - 1.27 mg/dL   GFR calc non Af Amer 51 (L) >59  mL/min/1.73   GFR calc Af Amer 58 (L) >59 mL/min/1.73   BUN/Creatinine Ratio 14 10 - 24   Sodium 140 134 - 144 mmol/L   Potassium 4.7 3.5 - 5.2 mmol/L   Chloride 101 96 - 106 mmol/L   CO2 25 18 - 29 mmol/L   Calcium 9.1 8.6 - 10.2 mg/dL   Total Protein 7.1 6.0 - 8.5 g/dL   Albumin 4.2 3.5 - 4.8 g/dL   Globulin, Total 2.9 1.5 - 4.5 g/dL   Albumin/Globulin Ratio 1.4 1.2 - 2.2   Bilirubin Total 0.5 0.0 - 1.2 mg/dL   Alkaline Phosphatase 54 39 - 117 IU/L   AST 26 0 - 40 IU/L   ALT 25 0 - 44 IU/L  Lipid Profile  Result Value Ref Range   Cholesterol, Total 142 100 - 199 mg/dL   Triglycerides 104 0 - 149 mg/dL   HDL 41 >39 mg/dL   VLDL Cholesterol Cal 21 5 - 40 mg/dL   LDL Calculated 80 0 - 99 mg/dL   Chol/HDL Ratio 3.5 0.0 - 5.0 ratio units  POCT HgB A1C  Result Value Ref Range   Hemoglobin A1C 6.1%       Assessment & Plan:   Problem List Items Addressed This Visit    None    Visit Diagnoses    Bronchitis    -  Primary    Treat for bronchitis. Contninue PRN inhlaer. Add ICS to help with breathing due to prednisone allergy. Doxy to cover atypicals Continue supportive care. Alarm symptoms reviewed. Discussed getting CXR today to r/o pneumonia- pt wants to wait. Will obtain Wednesday if not improved significantly.      Relevant Medications    doxycycline (VIBRA-TABS) 100 MG tablet    benzonatate (TESSALON) 100 MG capsule    Acute maxillary sinusitis, recurrence not specified        Treat for sinusitis. Treat with Doxy BID to cover for lung organisms as well. Supportive care at home. Alarm symptoms reviewed.     Relevant Medications    Dextromethorphan-Guaifenesin (MUCINEX DM) 30-600 MG TB12    doxycycline (VIBRA-TABS) 100 MG tablet    benzonatate (TESSALON) 100 MG capsule       Meds ordered this encounter  Medications  . DISCONTD: benzonatate (TESSALON) 100 MG capsule    Sig: TAKE 1 CAPSULE (100 MG TOTAL) BY MOUTH 3 (THREE) TIMES DAILY AS NEEDED.    Refill:  0  .  Dextromethorphan-Guaifenesin (MUCINEX DM) 30-600 MG TB12    Sig: TAKE 1 TABLET BY MOUTH 2 (TWO) TIMES DAILY.    Refill:  0  . simvastatin (ZOCOR) 40 MG tablet    Sig: Take 40 mg by mouth daily.    Refill:  3  . doxycycline (VIBRA-TABS) 100 MG tablet    Sig: Take 1 tablet (100 mg total) by mouth 2 (two) times daily.    Dispense:  14 tablet    Refill:  0    Order Specific Question:  Supervising Provider    Answer:  Arlis Porta [phone]  . fluticasone (FLOVENT HFA) 110 MCG/ACT inhaler    Sig: Inhale 2 puffs into the lungs 2 (two) times daily.    Dispense:  1 Inhaler    Refill:  12    Order Specific Question:  Supervising Provider    Answer:  Arlis Porta [phone]  . benzonatate (TESSALON) 100 MG capsule    Sig: Take 1 capsule (100 mg total) by mouth 3 (three) times daily as needed for cough.    Dispense:  30 capsule    Refill:  0    Order Specific Question:  Supervising Provider    Answer:  Arlis Porta [phone]      Follow up plan: Return if symptoms worsen or fail to improve.

## 2015-09-17 NOTE — Patient Instructions (Signed)
You can use supportive care at home to help with your symptoms. I have sent Mucinex DM to your pharmacy to help break up the congestion and soothe your cough. You can takes this twice daily.  I have also sent tesslon perles to your pharmacy to help with the cough- you can take these 3 times daily as needed. Honey is a natural cough suppressant- so add it to your tea in the morning.  If you have a humidifer, set that up in your bedroom at night.   Take doxycycline twice daily for 7 days to help with sinus infection.  Please seek immediate medical attention if you develop shortness of breath not relieve by inhaler, chest pain/tightness, fever > 103 F or other concerning symptoms.

## 2015-09-26 DIAGNOSIS — L578 Other skin changes due to chronic exposure to nonionizing radiation: Secondary | ICD-10-CM | POA: Diagnosis not present

## 2015-09-26 DIAGNOSIS — L814 Other melanin hyperpigmentation: Secondary | ICD-10-CM | POA: Diagnosis not present

## 2015-09-26 DIAGNOSIS — L570 Actinic keratosis: Secondary | ICD-10-CM | POA: Diagnosis not present

## 2015-09-30 ENCOUNTER — Other Ambulatory Visit: Payer: Self-pay | Admitting: Family Medicine

## 2015-10-11 DIAGNOSIS — H2513 Age-related nuclear cataract, bilateral: Secondary | ICD-10-CM | POA: Diagnosis not present

## 2015-10-11 DIAGNOSIS — H35342 Macular cyst, hole, or pseudohole, left eye: Secondary | ICD-10-CM | POA: Diagnosis not present

## 2015-10-19 DIAGNOSIS — I2510 Atherosclerotic heart disease of native coronary artery without angina pectoris: Secondary | ICD-10-CM | POA: Diagnosis not present

## 2015-10-19 DIAGNOSIS — I701 Atherosclerosis of renal artery: Secondary | ICD-10-CM | POA: Diagnosis not present

## 2015-10-19 DIAGNOSIS — I6523 Occlusion and stenosis of bilateral carotid arteries: Secondary | ICD-10-CM | POA: Diagnosis not present

## 2015-10-20 ENCOUNTER — Other Ambulatory Visit: Payer: Self-pay | Admitting: Family Medicine

## 2015-10-22 ENCOUNTER — Other Ambulatory Visit: Payer: Self-pay | Admitting: Family Medicine

## 2015-10-23 ENCOUNTER — Telehealth: Payer: Self-pay | Admitting: Family Medicine

## 2015-10-23 MED ORDER — GLUCOSE BLOOD VI STRP: ORAL_STRIP | Status: DC

## 2015-10-23 NOTE — Telephone Encounter (Signed)
Pt needs a refill on accucheck test strips sent to [facility].  He is there now.  His call back number is [phone]

## 2015-10-23 NOTE — Telephone Encounter (Signed)
Keith Cruz.[place] 

## 2015-10-25 ENCOUNTER — Encounter (INDEPENDENT_AMBULATORY_CARE_PROVIDER_SITE_OTHER): Payer: Medicare Other | Admitting: Ophthalmology

## 2015-10-25 DIAGNOSIS — H35342 Macular cyst, hole, or pseudohole, left eye: Secondary | ICD-10-CM | POA: Diagnosis not present

## 2015-10-25 DIAGNOSIS — H43813 Vitreous degeneration, bilateral: Secondary | ICD-10-CM | POA: Diagnosis not present

## 2015-10-25 DIAGNOSIS — H35033 Hypertensive retinopathy, bilateral: Secondary | ICD-10-CM | POA: Diagnosis not present

## 2015-10-25 DIAGNOSIS — H2513 Age-related nuclear cataract, bilateral: Secondary | ICD-10-CM | POA: Diagnosis not present

## 2015-10-25 DIAGNOSIS — H353121 Nonexudative age-related macular degeneration, left eye, early dry stage: Secondary | ICD-10-CM

## 2015-10-25 DIAGNOSIS — I10 Essential (primary) hypertension: Secondary | ICD-10-CM

## 2015-10-25 LAB — HM DIABETES EYE EXAM

## 2015-10-26 NOTE — Telephone Encounter (Signed)
Pt checks BS twice daily.

## 2015-11-11 ENCOUNTER — Other Ambulatory Visit: Payer: Self-pay | Admitting: Family Medicine

## 2015-11-19 ENCOUNTER — Ambulatory Visit (INDEPENDENT_AMBULATORY_CARE_PROVIDER_SITE_OTHER): Payer: Medicare Other | Admitting: Family Medicine

## 2015-11-19 ENCOUNTER — Ambulatory Visit
Admission: RE | Admit: 2015-11-19 | Discharge: 2015-11-19 | Disposition: A | Discharge status: Home / Self Care | Payer: Medicare Other | Source: Ambulatory Visit | Attending: Family Medicine | Admitting: Family Medicine

## 2015-11-19 ENCOUNTER — Telehealth: Payer: Self-pay | Admitting: Family Medicine

## 2015-11-19 ENCOUNTER — Encounter: Payer: Self-pay | Admitting: Family Medicine

## 2015-11-19 VITALS — BP 126/72 | HR 61 | Temp 97.9°F | Resp 16 | Ht 75.0 in | Wt 214.6 lb

## 2015-11-19 DIAGNOSIS — R059 Cough, unspecified: Secondary | ICD-10-CM

## 2015-11-19 DIAGNOSIS — R05 Cough: Secondary | ICD-10-CM | POA: Insufficient documentation

## 2015-11-19 DIAGNOSIS — I2510 Atherosclerotic heart disease of native coronary artery without angina pectoris: Secondary | ICD-10-CM

## 2015-11-19 DIAGNOSIS — K219 Gastro-esophageal reflux disease without esophagitis: Secondary | ICD-10-CM | POA: Diagnosis not present

## 2015-11-19 DIAGNOSIS — E7800 Pure hypercholesterolemia, unspecified: Secondary | ICD-10-CM | POA: Diagnosis not present

## 2015-11-19 DIAGNOSIS — R079 Chest pain, unspecified: Secondary | ICD-10-CM | POA: Diagnosis not present

## 2015-11-19 DIAGNOSIS — I700 Atherosclerosis of aorta: Secondary | ICD-10-CM | POA: Diagnosis not present

## 2015-11-19 DIAGNOSIS — J4530 Mild persistent asthma, uncomplicated: Secondary | ICD-10-CM | POA: Diagnosis not present

## 2015-11-19 DIAGNOSIS — R0602 Shortness of breath: Secondary | ICD-10-CM | POA: Diagnosis not present

## 2015-11-19 MED ORDER — RANITIDINE HCL 150 MG PO CAPS
150.0000 mg | ORAL_CAPSULE | Freq: Two times a day (BID) | ORAL | 3 refills | Status: DC
Start: 2015-11-19 — End: 2016-02-25

## 2015-11-19 MED ORDER — CLOPIDOGREL BISULFATE 75 MG PO TABS: 75.0000 mg | ORAL_TABLET | Freq: Every day | ORAL | 3 refills | Status: DC

## 2015-11-19 MED ORDER — PROMETHAZINE-CODEINE 6.25-10 MG/5ML PO SYRP: 5.0000 mL | ORAL_SOLUTION | Freq: Every day | ORAL | 0 refills | Status: DC

## 2015-11-19 MED ORDER — BENZONATATE 100 MG PO CAPS
100.0000 mg | ORAL_CAPSULE | Freq: Three times a day (TID) | ORAL | 0 refills | Status: DC | PRN
Start: 2015-11-19 — End: 2015-12-10

## 2015-11-19 MED ORDER — ALBUTEROL SULFATE HFA 108 (90 BASE) MCG/ACT IN AERS: 1.0000 | INHALATION_SPRAY | RESPIRATORY_TRACT | 11 refills | Status: DC | PRN

## 2015-11-19 MED ORDER — MUCINEX DM 30-600 MG PO TB12: ORAL_TABLET | ORAL | 0 refills | Status: DC

## 2015-11-19 MED ORDER — PANTOPRAZOLE SODIUM 40 MG PO TBEC: 40.0000 mg | DELAYED_RELEASE_TABLET | Freq: Every day | ORAL | 3 refills | Status: DC

## 2015-11-19 MED ORDER — METOPROLOL TARTRATE 50 MG PO TABS: 50.0000 mg | ORAL_TABLET | Freq: Two times a day (BID) | ORAL | 3 refills | Status: DC

## 2015-11-19 MED ORDER — SIMVASTATIN 40 MG PO TABS: 40.0000 mg | ORAL_TABLET | Freq: Every day | ORAL | 3 refills | Status: DC

## 2015-11-19 NOTE — Telephone Encounter (Signed)
Reviewed CXR with patient. No pneumonia.

## 2015-11-19 NOTE — Assessment & Plan Note (Signed)
Add Zantac to PPI. If not improving, consider Gi referral for EGD.

## 2015-11-19 NOTE — Assessment & Plan Note (Signed)
Cough and congestion could be worsening asthma or progression to COPD. Pt will need spirometry. Step up to daily ICS- pt encouraged to take flovent daily. Alarm symptoms reviewed.

## 2015-11-19 NOTE — Assessment & Plan Note (Signed)
Renewed simvastatin today.

## 2015-11-19 NOTE — Patient Instructions (Signed)
Please seek immediate medical attention if you develop shortness of breath not relieve by inhaler, chest pain/tightness, fever > 103 F or other concerning symptoms.   Take your Flovent inhaler twice daily to help with breathing.

## 2015-11-19 NOTE — Progress Notes (Signed)
Subjective:    Patient ID: Keith Cruz, male    DOB: [birth year], 75 y.o.   MRN: [id number]  HPI: Keith Cruz is a 75 y.o. male presenting on 11/19/2015 for Cough (pt has hx of heart surgery has cough now onset 3 days HA no sinus pressure or fever hurts on chest when he is coughing)   HPI  Pt presents for cough and chest congestion. Cough with increasing sputum over past 3 days. Pt has a baseline cough and reports "a little bit of asthma" No shortness of breath. No chest tightness. But pain in the chest with coughing- Middle of chest to L upper chest. Only present with fits of coughing. No present at rest. Was able to do his usual activities. No fevers. Yellow/green sputum.  Quit smoking 1972. Drove a truck for 30 years.  Pt also needs refills on his medications. Pt wife reports she doesn't feel his GERD medications are helping because he taking alkaseltzer everyday.   Past Medical History:  Diagnosis Date  . Hypertension   . Kidney disease   . Myocardial infarction ([place])   . Seasonal allergies     Current Outpatient Prescriptions on File Prior to Visit  Medication Sig  . ACCU-CHEK AVIVA PLUS test strip USE ONE STRIP DAILY  . Cholecalciferol (VITAMIN D3) 2000 UNITS capsule Take 2,000 Units by mouth daily.  . clobetasol (TEMOVATE) 0.05 % external solution Apply 2 application topically as needed.  . Cyanocobalamin (RA VITAMIN B-12 TR) 1000 MCG TBCR Take 1,000 mcg by mouth daily. Reported on 08/23/2015  . fexofenadine (ALLEGRA) 180 MG tablet TAKE 1 TABLET BY MOUTH DAILY  . fluticasone (FLONASE) 50 MCG/ACT nasal spray SPRAY TWICE IN EACH NOSTRIL DAILY  . fluticasone (FLOVENT HFA) 110 MCG/ACT inhaler Inhale 2 puffs into the lungs 2 (two) times daily.  . folic acid (FOLVITE) 1 MG tablet Take 0.8 mg by mouth daily.  . furosemide (LASIX) 20 MG tablet Take 1 tablet (20 mg total) by mouth daily.  Marland Kitchen levETIRAcetam (KEPPRA) 250 MG tablet Take 250 mg by mouth 2 (two) times daily. Reported on  08/23/2015  . LYCOPENE PO Take 1 tablet by mouth daily. Reported on 08/23/2015  . oseltamivir (TAMIFLU) 75 MG capsule Take 1 capsule (75 mg total) by mouth 2 (two) times daily.  . sildenafil (VIAGRA) 50 MG tablet Take 1 tablet (50 mg total) by mouth daily as needed for erectile dysfunction.  . sodium chloride (OCEAN) 0.65 % SOLN nasal spray Place 2 sprays into both nostrils every 2 (two) hours while awake.  . traZODone (DESYREL) 50 MG tablet Take 0.5-1 tablets (25-50 mg total) by mouth at bedtime as needed for sleep.  Marland Kitchen triamcinolone cream (KENALOG) 0.1 % Apply 1 application topically as needed.  . simvastatin (ZOCOR) 40 MG tablet Take 40 mg by mouth daily.   No current facility-administered medications on file prior to visit.     Review of Systems  Constitutional: Positive for chills (saturday). Negative for fever.  HENT: Negative for congestion, facial swelling, sinus pressure and sneezing.   Respiratory: Positive for cough. Negative for chest tightness, shortness of breath and wheezing.        Pleuritic pain when coughing   Per HPI unless specifically indicated above     Objective:    BP 126/72 (BP Location: Left Arm, Patient Position: Sitting, Cuff Size: Normal)   Pulse 61   Temp 97.9 F (36.6 C) (Oral)   Resp 16   Ht 6\' 3"  (1.905  m)   Wt 214 lb 9.6 oz (97.3 kg)   SpO2 95%   BMI 26.82 kg/m   Wt Readings from Last 3 Encounters:  11/19/15 214 lb 9.6 oz (97.3 kg)  09/17/15 207 lb (93.9 kg)  08/23/15 213 lb 3.2 oz (96.7 kg)    Physical Exam  Constitutional: He is oriented to person, place, and time. He appears well-developed and well-nourished. No distress.  HENT:  Head: Normocephalic and atraumatic.  Nose: Nose normal.  Neck: Neck supple. No thyromegaly present.  Cardiovascular: Normal rate, regular rhythm and normal heart sounds.  Exam reveals no gallop and no friction rub.   No murmur heard. Pulmonary/Chest: Effort normal and breath sounds normal. No respiratory  distress. He has no wheezes. He has no rales. Chest wall is not dull to percussion. He exhibits no mass, no tenderness and no crepitus.  Abdominal: Soft. Bowel sounds are normal. He exhibits no distension. There is no tenderness. There is no rebound.  Musculoskeletal: Normal range of motion. He exhibits no edema or tenderness.  Neurological: He is alert and oriented to person, place, and time. He has normal reflexes.  Skin: Skin is warm and dry. No rash noted. No erythema.  Psychiatric: He has a normal mood and affect. His behavior is normal. Thought content normal.   Results for orders placed or performed in visit on 08/23/15  CBC with Differential  Result Value Ref Range   WBC 7.0 3.4 - 10.8 x10E3/uL   RBC 4.29 4.14 - 5.80 x10E6/uL   Hemoglobin 14.5 12.6 - 17.7 g/dL   Hematocrit 42.3 37.5 - 51.0 %   MCV 99 (H) 79 - 97 fL   MCH 33.8 (H) 26.6 - 33.0 pg   MCHC 34.3 31.5 - 35.7 g/dL   RDW 13.5 12.3 - 15.4 %   Platelets 241 150 - 379 x10E3/uL   Neutrophils 58 %   Lymphs 29 %   Monocytes 8 %   Eos 4 %   Basos 1 %   Neutrophils Absolute 4.1 1.4 - 7.0 x10E3/uL   Lymphocytes Absolute 2.0 0.7 - 3.1 x10E3/uL   Monocytes Absolute 0.5 0.1 - 0.9 x10E3/uL   EOS (ABSOLUTE) 0.3 0.0 - 0.4 x10E3/uL   Basophils Absolute 0.0 0.0 - 0.2 x10E3/uL   Immature Granulocytes 0 %   Immature Grans (Abs) 0.0 0.0 - 0.1 x10E3/uL  Comprehensive Metabolic Panel (CMET)  Result Value Ref Range   Glucose 113 (H) 65 - 99 mg/dL   BUN 19 8 - 27 mg/dL   Creatinine, Ser 1.36 (H) 0.76 - 1.27 mg/dL   GFR calc non Af Amer 51 (L) >59 mL/min/1.73   GFR calc Af Amer 58 (L) >59 mL/min/1.73   BUN/Creatinine Ratio 14 10 - 24   Sodium 140 134 - 144 mmol/L   Potassium 4.7 3.5 - 5.2 mmol/L   Chloride 101 96 - 106 mmol/L   CO2 25 18 - 29 mmol/L   Calcium 9.1 8.6 - 10.2 mg/dL   Total Protein 7.1 6.0 - 8.5 g/dL   Albumin 4.2 3.5 - 4.8 g/dL   Globulin, Total 2.9 1.5 - 4.5 g/dL   Albumin/Globulin Ratio 1.4 1.2 - 2.2    Bilirubin Total 0.5 0.0 - 1.2 mg/dL   Alkaline Phosphatase 54 39 - 117 IU/L   AST 26 0 - 40 IU/L   ALT 25 0 - 44 IU/L  Lipid Profile  Result Value Ref Range   Cholesterol, Total 142 100 - 199 mg/dL   Triglycerides 104  0 - 149 mg/dL   HDL 41 >39 mg/dL   VLDL Cholesterol Cal 21 5 - 40 mg/dL   LDL Calculated 80 0 - 99 mg/dL   Chol/HDL Ratio 3.5 0.0 - 5.0 ratio units  POCT HgB A1C  Result Value Ref Range   Hemoglobin A1C 6.1%       Assessment & Plan:   Problem List Items Addressed This Visit      Cardiovascular and Mediastinum   Arteriosclerosis of coronary artery - Primary    Renwed plavix and metoprolol today. Next appt with Cardiology on 01/11/16      Relevant Medications   metoprolol (LOPRESSOR) 50 MG tablet   simvastatin (ZOCOR) 40 MG tablet     Respiratory   Asthma, mild persistent    Cough and congestion could be worsening asthma or progression to COPD. Pt will need spirometry. Step up to daily ICS- pt encouraged to take flovent daily. Alarm symptoms reviewed.       Relevant Medications   albuterol (PROVENTIL HFA;VENTOLIN HFA) 108 (90 Base) MCG/ACT inhaler   Other Relevant Orders   DG Chest 2 View     Digestive   Acid reflux   Relevant Medications   pantoprazole (PROTONIX) 40 MG tablet   ranitidine (ZANTAC) 150 MG capsule     Other   Elevated cholesterol    Renewed simvastatin today.       Relevant Medications   metoprolol (LOPRESSOR) 50 MG tablet   simvastatin (ZOCOR) 40 MG tablet    Other Visit Diagnoses    Cough       R/o PNA via chest Xr. Treat pleuritic pain with codiene cough syrup. IF pains continue- pt told to seek medical attention for possible cardiac work-up. Supportive care at home. Alarm symptoms reviewed.    Relevant Medications   benzonatate (TESSALON) 100 MG capsule   Other Relevant Orders   DG Chest 2 View      Meds ordered this encounter  Medications  . pantoprazole (PROTONIX) 40 MG tablet    Sig: Take 1 tablet (40 mg total) by  mouth daily.    Dispense:  90 tablet    Refill:  3    Order Specific Question:   Supervising Provider    Answer:   Arlis Porta [phone]  . ranitidine (ZANTAC) 150 MG capsule    Sig: Take 1 capsule (150 mg total) by mouth 2 (two) times daily.    Dispense:  180 capsule    Refill:  3    Order Specific Question:   Supervising Provider    Answer:   Arlis Porta [phone]  . benzonatate (TESSALON) 100 MG capsule    Sig: Take 1 capsule (100 mg total) by mouth 3 (three) times daily as needed for cough.    Dispense:  30 capsule    Refill:  0    Order Specific Question:   Supervising Provider    Answer:   Arlis Porta [id number]  . albuterol (PROVENTIL HFA;VENTOLIN HFA) 108 (90 Base) MCG/ACT inhaler    Sig: Inhale 1-2 puffs into the lungs every 4 (four) hours as needed for wheezing or shortness of breath.    Dispense:  1 Inhaler    Refill:  11    Order Specific Question:   Supervising Provider    Answer:   Arlis Porta [phone]  . Dextromethorphan-Guaifenesin (MUCINEX DM) 30-600 MG TB12    Sig: TAKE 1 TABLET BY MOUTH 2 (TWO) TIMES DAILY.  Dispense:  28 each    Refill:  0    Order Specific Question:   Supervising Provider    Answer:   Arlis Porta [phone]  . clopidogrel (PLAVIX) 75 MG tablet    Sig: Take 1 tablet (75 mg total) by mouth daily.    Dispense:  90 tablet    Refill:  3    Order Specific Question:   Supervising Provider    Answer:   Arlis Porta [phone]  . metoprolol (LOPRESSOR) 50 MG tablet    Sig: Take 1 tablet (50 mg total) by mouth 2 (two) times daily.    Dispense:  180 tablet    Refill:  3    Order Specific Question:   Supervising Provider    Answer:   Arlis Porta [phone]  . simvastatin (ZOCOR) 40 MG tablet    Sig: Take 1 tablet (40 mg total) by mouth daily.    Dispense:  90 tablet    Refill:  3    Order Specific Question:   Supervising Provider    Answer:   Arlis Porta [phone]  . promethazine-codeine  (PHENERGAN WITH CODEINE) 6.25-10 MG/5ML syrup    Sig: Take 5 mLs by mouth at bedtime.    Dispense:  120 mL    Refill:  0    Order Specific Question:   Supervising Provider    Answer:   Arlis Porta [id number]      Follow up plan: Return in about 3 weeks (around 12/10/2015) for Spirometry.

## 2015-11-19 NOTE — Assessment & Plan Note (Signed)
Renwed plavix and metoprolol today. Next appt with Cardiology on 01/11/16

## 2015-11-28 ENCOUNTER — Other Ambulatory Visit: Payer: Self-pay | Admitting: Family Medicine

## 2015-11-28 DIAGNOSIS — K219 Gastro-esophageal reflux disease without esophagitis: Secondary | ICD-10-CM

## 2015-12-07 ENCOUNTER — Encounter: Payer: Self-pay | Admitting: Family Medicine

## 2015-12-10 ENCOUNTER — Ambulatory Visit (INDEPENDENT_AMBULATORY_CARE_PROVIDER_SITE_OTHER): Payer: Medicare Other | Admitting: Family Medicine

## 2015-12-10 ENCOUNTER — Encounter: Payer: Self-pay | Admitting: Family Medicine

## 2015-12-10 DIAGNOSIS — Z23 Encounter for immunization: Secondary | ICD-10-CM | POA: Diagnosis not present

## 2015-12-10 DIAGNOSIS — J410 Simple chronic bronchitis: Secondary | ICD-10-CM

## 2015-12-10 DIAGNOSIS — J42 Unspecified chronic bronchitis: Secondary | ICD-10-CM | POA: Insufficient documentation

## 2015-12-10 MED ORDER — FLUTICASONE PROPIONATE HFA 110 MCG/ACT IN AERO: 2.0000 | INHALATION_SPRAY | Freq: Two times a day (BID) | RESPIRATORY_TRACT | 12 refills | Status: DC

## 2015-12-10 NOTE — Progress Notes (Signed)
Name: Keith Cruz   MRN: [id number]    DOB: 05-01-1940   Date:12/10/2015       Progress Note  Subjective  Chief Complaint  Chief Complaint  Patient presents with  . COPD    HPI Here for f/u of lung disease that appears to be COPD.  Smoker for 10-12 years.  None since 1972.  He has had some cough daily that is prodiuctive and requires codeine cough.  He uses Albuterol inhaler occ.  He feels that he wheezes sometimes.   He reports that he hs never had a pneumonia vaccine.  No problem-specific Assessment & Plan notes found for this encounter.   Past Medical History:  Diagnosis Date  . Hypertension   . Kidney disease   . Myocardial infarction ([place])   . Seasonal allergies     Past Surgical History:  Procedure Laterality Date  . CORONARY ARTERY BYPASS GRAFT      Family History  Problem Relation Age of Onset  . Stroke Mother   . Heart attack Mother   . Stroke Father   . Stroke Paternal Grandmother   . Stroke Paternal Grandfather     Social History   Social History  . Marital status: Single    Spouse name: N/A  . Number of children: N/A  . Years of education: N/A   Occupational History  . Not on file.   Social History Main Topics  . Smoking status: Former Smoker    Packs/day: 2.00    Years: 10.00    Types: Cigarettes  . Smokeless tobacco: Never Used  . Alcohol use No     Comment: previous  . Drug use: No  . Sexual activity: Not on file   Other Topics Concern  . Not on file   Social History Narrative  . No narrative on file     Current Outpatient Prescriptions:  .  ACCU-CHEK AVIVA PLUS test strip, USE ONE STRIP DAILY, Disp: 100 each, Rfl: 0 .  albuterol (PROVENTIL HFA;VENTOLIN HFA) 108 (90 Base) MCG/ACT inhaler, Inhale 1-2 puffs into the lungs every 4 (four) hours as needed for wheezing or shortness of breath., Disp: 1 Inhaler, Rfl: 11 .  Cholecalciferol (VITAMIN D3) 2000 UNITS capsule, Take 2,000 Units by mouth daily., Disp: , Rfl:  .  clobetasol  (TEMOVATE) 0.05 % external solution, Apply 2 application topically as needed., Disp: , Rfl:  .  clopidogrel (PLAVIX) 75 MG tablet, Take 1 tablet (75 mg total) by mouth daily., Disp: 90 tablet, Rfl: 3 .  Dextromethorphan-Guaifenesin (MUCINEX DM) 30-600 MG TB12, TAKE 1 TABLET BY MOUTH 2 (TWO) TIMES DAILY. (Patient taking differently: Take 1 tablet by mouth 3 times/day as needed-between meals & bedtime. TAKE 1 TABLET BY MOUTH 2 (TWO) TIMES DAILY.), Disp: 28 each, Rfl: 0 .  fexofenadine (ALLEGRA) 180 MG tablet, TAKE 1 TABLET BY MOUTH DAILY, Disp: 90 tablet, Rfl: 3 .  fluticasone (FLONASE) 50 MCG/ACT nasal spray, SPRAY TWICE IN EACH NOSTRIL DAILY, Disp: , Rfl: 4 .  fluticasone (FLOVENT HFA) 110 MCG/ACT inhaler, Inhale 2 puffs into the lungs 2 (two) times daily., Disp: 1 Inhaler, Rfl: 12 .  folic acid (FOLVITE) 1 MG tablet, Take 0.8 mg by mouth daily., Disp: , Rfl:  .  metoprolol (LOPRESSOR) 50 MG tablet, Take 1 tablet (50 mg total) by mouth 2 (two) times daily., Disp: 180 tablet, Rfl: 3 .  pantoprazole (PROTONIX) 40 MG tablet, TAKE 1 TABLET BY MOUTH DAILY, Disp: 90 tablet, Rfl: 3 .  promethazine-codeine (  PHENERGAN WITH CODEINE) 6.25-10 MG/5ML syrup, Take 5 mLs by mouth at bedtime., Disp: 120 mL, Rfl: 0 .  ranitidine (ZANTAC) 150 MG capsule, Take 1 capsule (150 mg total) by mouth 2 (two) times daily., Disp: 180 capsule, Rfl: 3 .  sildenafil (VIAGRA) 50 MG tablet, Take 1 tablet (50 mg total) by mouth daily as needed for erectile dysfunction., Disp: 6 tablet, Rfl: 12 .  simvastatin (ZOCOR) 40 MG tablet, Take 1 tablet (40 mg total) by mouth daily., Disp: 90 tablet, Rfl: 3 .  triamcinolone cream (KENALOG) 0.1 %, Apply 1 application topically as needed., Disp: , Rfl:  .  LYCOPENE PO, Take 1 tablet by mouth daily. Reported on 08/23/2015, Disp: , Rfl:  .  simvastatin (ZOCOR) 40 MG tablet, Take 40 mg by mouth daily., Disp: , Rfl:   Allergies  Allergen Reactions  . Fexofenadine-Pseudoephed Er Swelling    Tongue  and face. Only with D product. Tolerates plain allegra at home.  Tongue and face. Only with D product. Tolerates plain allegra at home.   . Nifedipine Swelling    Tongue and face Tongue and face Tongue and face  . Allegra  [Fexofenadine] Swelling    Tongue and face. Only with D product. Tolerates plain allegra at home.   . Prednisone     Other reaction(s): Other (See Comments) Mental status changes Other reaction(s): Other (See Comments) Mental status changes     Review of Systems  Constitutional: Negative for chills, fever, malaise/fatigue and weight loss.  HENT: Negative for hearing loss.   Eyes: Negative for blurred vision and double vision.  Respiratory: Positive for cough, sputum production and wheezing. Negative for shortness of breath.   Cardiovascular: Negative for chest pain, palpitations and leg swelling.  Gastrointestinal: Negative for abdominal pain, blood in stool and heartburn.  Genitourinary: Negative for dysuria, frequency and urgency.  Skin: Negative for rash.  Neurological: Negative for dizziness, tremors, weakness and headaches.  Psychiatric/Behavioral: Negative for depression.      Objective  Vitals:   12/10/15 0857  BP: 120/64  Pulse: 60  Resp: 16  Temp: 97.7 F (36.5 C)  TempSrc: Oral  Weight: 221 lb (100.2 kg)  Height: 6\' 3"  (1.905 m)    Physical Exam  Constitutional: He is oriented to person, place, and time and well-developed, well-nourished, and in no distress. No distress.  HENT:  Head: Normocephalic and atraumatic.  Eyes: Conjunctivae and EOM are normal. Pupils are equal, round, and reactive to light. No scleral icterus.  Neck: Normal range of motion. Neck supple. Carotid bruit is not present. No thyromegaly present.  Cardiovascular: Normal rate, regular rhythm and normal heart sounds.  Exam reveals no gallop and no friction rub.   No murmur heard. Pulmonary/Chest: Effort normal and breath sounds normal. No respiratory distress. He has  no wheezes. He has no rales.  Mild decreased breath sounds throughout.  Musculoskeletal: He exhibits no edema.  Lymphadenopathy:    He has no cervical adenopathy.  Neurological: He is alert and oriented to person, place, and time.  Vitals reviewed.      Recent Results (from the past 2160 hour(s))  HM DIABETES EYE EXAM     Status: None   Collection Time: 10/25/15 12:00 AM  Result Value Ref Range   HM Diabetic Eye Exam No Retinopathy No Retinopathy     Assessment & Plan  Problem List Items Addressed This Visit      Respiratory   Chronic bronchitis ([place])   Relevant Orders  Pneumococcal conjugate vaccine 13-valent    Other Visit Diagnoses   None.     Meds ordered this encounter  Medications  . fluticasone (FLOVENT HFA) 110 MCG/ACT inhaler    Sig: Inhale 2 puffs into the lungs 2 (two) times daily.    Dispense:  1 Inhaler    Refill:  12   1. Simple chronic bronchitis (HCC)  - Pneumococcal conjugate vaccine 13-valent Use Flovent daily. Use ProAir as needed.

## 2015-12-13 DIAGNOSIS — I701 Atherosclerosis of renal artery: Secondary | ICD-10-CM | POA: Diagnosis not present

## 2015-12-13 DIAGNOSIS — N183 Chronic kidney disease, stage 3 (moderate): Secondary | ICD-10-CM | POA: Diagnosis not present

## 2015-12-13 DIAGNOSIS — I129 Hypertensive chronic kidney disease with stage 1 through stage 4 chronic kidney disease, or unspecified chronic kidney disease: Secondary | ICD-10-CM | POA: Diagnosis not present

## 2016-01-01 ENCOUNTER — Other Ambulatory Visit: Payer: Self-pay | Admitting: Family Medicine

## 2016-01-01 DIAGNOSIS — R05 Cough: Secondary | ICD-10-CM

## 2016-01-01 DIAGNOSIS — R059 Cough, unspecified: Secondary | ICD-10-CM

## 2016-01-11 DIAGNOSIS — N183 Chronic kidney disease, stage 3 (moderate): Secondary | ICD-10-CM | POA: Diagnosis not present

## 2016-01-11 DIAGNOSIS — I2510 Atherosclerotic heart disease of native coronary artery without angina pectoris: Secondary | ICD-10-CM | POA: Diagnosis not present

## 2016-01-11 DIAGNOSIS — E782 Mixed hyperlipidemia: Secondary | ICD-10-CM | POA: Diagnosis not present

## 2016-01-11 DIAGNOSIS — K219 Gastro-esophageal reflux disease without esophagitis: Secondary | ICD-10-CM | POA: Diagnosis not present

## 2016-01-25 ENCOUNTER — Encounter: Payer: Self-pay | Admitting: Family Medicine

## 2016-01-25 ENCOUNTER — Ambulatory Visit (INDEPENDENT_AMBULATORY_CARE_PROVIDER_SITE_OTHER): Payer: Medicare Other | Admitting: Family Medicine

## 2016-01-25 VITALS — BP 121/60 | HR 79 | Temp 98.5°F | Resp 16 | Ht 75.0 in | Wt 217.4 lb

## 2016-01-25 DIAGNOSIS — J111 Influenza due to unidentified influenza virus with other respiratory manifestations: Secondary | ICD-10-CM

## 2016-01-25 DIAGNOSIS — R69 Illness, unspecified: Principal | ICD-10-CM

## 2016-01-25 LAB — POCT INFLUENZA A/B
Influenza A, POC: NEGATIVE
Influenza B, POC: NEGATIVE

## 2016-01-25 MED ORDER — OSELTAMIVIR PHOSPHATE 75 MG PO CAPS: 75.0000 mg | ORAL_CAPSULE | Freq: Once | ORAL | Status: DC

## 2016-01-25 MED ORDER — OSELTAMIVIR PHOSPHATE 75 MG PO CAPS
75.0000 mg | ORAL_CAPSULE | Freq: Two times a day (BID) | ORAL | 0 refills | Status: AC
Start: 2016-01-25 — End: 2016-01-30

## 2016-01-25 MED ORDER — AZITHROMYCIN 250 MG PO TABS: ORAL_TABLET | ORAL | 0 refills | Status: DC

## 2016-01-25 NOTE — Progress Notes (Signed)
Name: Keith Cruz   MRN: [id number]    DOB: 03/19/1941   Date:01/25/2016       Progress Note  Subjective  Chief Complaint  Chief Complaint  Patient presents with  . Fever    low grade fever, chest congestion onset yesterday HA hurts facial pressure    HPI Sick x 24 hrs with chills, fever (low grade, nothing over 100).  He has congestion, cough, nausea, aches).Marland Kitchen Has Flu shot 4 weeks ago.  No problem-specific Assessment & Plan notes found for this encounter.   Past Medical History:  Diagnosis Date  . Hypertension   . Kidney disease   . Myocardial infarction ([place])   . Seasonal allergies     Social History  Substance Use Topics  . Smoking status: Former Smoker    Packs/day: 2.00    Years: 10.00    Types: Cigarettes  . Smokeless tobacco: Former Systems developer  . Alcohol use No     Comment: previous     Current Outpatient Prescriptions:  .  ACCU-CHEK AVIVA PLUS test strip, USE ONE STRIP DAILY, Disp: 100 each, Rfl: 0 .  albuterol (PROVENTIL HFA;VENTOLIN HFA) 108 (90 Base) MCG/ACT inhaler, Inhale 1-2 puffs into the lungs every 4 (four) hours as needed for wheezing or shortness of breath., Disp: 1 Inhaler, Rfl: 11 .  benzonatate (TESSALON) 100 MG capsule, TAKE 1 CAPSULE (100 MG TOTAL) BY MOUTH 3 (THREE) TIMES DAILY AS NEEDED FOR COUGH., Disp: 30 capsule, Rfl: 0 .  Cholecalciferol (VITAMIN D3) 2000 UNITS capsule, Take 2,000 Units by mouth daily., Disp: , Rfl:  .  clobetasol (TEMOVATE) 0.05 % external solution, Apply 2 application topically as needed., Disp: , Rfl:  .  clopidogrel (PLAVIX) 75 MG tablet, Take 1 tablet (75 mg total) by mouth daily., Disp: 90 tablet, Rfl: 3 .  Dextromethorphan-Guaifenesin (MUCINEX DM) 30-600 MG TB12, TAKE 1 TABLET BY MOUTH 2 (TWO) TIMES DAILY. (Patient taking differently: Take 1 tablet by mouth 3 times/day as needed-between meals & bedtime. TAKE 1 TABLET BY MOUTH 2 (TWO) TIMES DAILY.), Disp: 28 each, Rfl: 0 .  fexofenadine (ALLEGRA) 180 MG tablet, TAKE 1  TABLET BY MOUTH DAILY, Disp: 90 tablet, Rfl: 3 .  fluticasone (FLONASE) 50 MCG/ACT nasal spray, SPRAY TWICE IN EACH NOSTRIL DAILY, Disp: , Rfl: 4 .  fluticasone (FLOVENT HFA) 110 MCG/ACT inhaler, Inhale 2 puffs into the lungs 2 (two) times daily., Disp: 1 Inhaler, Rfl: 12 .  folic acid (FOLVITE) 1 MG tablet, Take 0.8 mg by mouth daily., Disp: , Rfl:  .  LYCOPENE PO, Take 1 tablet by mouth daily. Reported on 08/23/2015, Disp: , Rfl:  .  metoprolol (LOPRESSOR) 50 MG tablet, Take 1 tablet (50 mg total) by mouth 2 (two) times daily., Disp: 180 tablet, Rfl: 3 .  pantoprazole (PROTONIX) 40 MG tablet, TAKE 1 TABLET BY MOUTH DAILY, Disp: 90 tablet, Rfl: 3 .  promethazine-codeine (PHENERGAN WITH CODEINE) 6.25-10 MG/5ML syrup, Take 5 mLs by mouth at bedtime., Disp: 120 mL, Rfl: 0 .  ranitidine (ZANTAC) 150 MG capsule, Take 1 capsule (150 mg total) by mouth 2 (two) times daily., Disp: 180 capsule, Rfl: 3 .  sildenafil (VIAGRA) 50 MG tablet, Take 1 tablet (50 mg total) by mouth daily as needed for erectile dysfunction., Disp: 6 tablet, Rfl: 12 .  simvastatin (ZOCOR) 40 MG tablet, Take 1 tablet (40 mg total) by mouth daily., Disp: 90 tablet, Rfl: 3 .  triamcinolone cream (KENALOG) 0.1 %, Apply 1 application topically as needed., Disp: ,  Rfl:  .  simvastatin (ZOCOR) 40 MG tablet, Take 40 mg by mouth daily., Disp: , Rfl:   Allergies  Allergen Reactions  . Fexofenadine-Pseudoephed Er Swelling    Tongue and face. Only with D product. Tolerates plain allegra at home.  Tongue and face. Only with D product. Tolerates plain allegra at home.   . Nifedipine Swelling    Tongue and face Tongue and face Tongue and face  . Allegra  [Fexofenadine] Swelling    Tongue and face. Only with D product. Tolerates plain allegra at home.   . Prednisone     Other reaction(s): Other (See Comments) Mental status changes Other reaction(s): Other (See Comments) Mental status changes    Review of Systems  Constitutional:  Positive for chills, fever and malaise/fatigue.  HENT: Positive for congestion. Negative for hearing loss.   Eyes: Negative for blurred vision and double vision.  Respiratory: Positive for cough and sputum production (yellow). Negative for shortness of breath and wheezing.   Cardiovascular: Negative for chest pain, palpitations and leg swelling.  Gastrointestinal: Negative for abdominal pain, blood in stool and heartburn.  Genitourinary: Negative for dysuria, frequency and urgency.  Skin: Negative for rash.  Neurological: Positive for weakness and headaches. Negative for dizziness and tremors.      Objective  Vitals:   01/25/16 1013  BP: 121/60  Pulse: 79  Resp: 16  Temp: 98.5 F (36.9 C)  TempSrc: Oral  SpO2: 94%  Weight: 217 lb 6.4 oz (98.6 kg)  Height: 6\' 3"  (1.905 m)     Physical Exam  Constitutional: He is oriented to person, place, and time and well-developed, well-nourished, and in no distress. No distress.  HENT:  Head: Normocephalic and atraumatic.  Right Ear: External ear normal.  Left Ear: External ear normal.  Nose: Mucosal edema and rhinorrhea present. Right sinus exhibits no maxillary sinus tenderness and no frontal sinus tenderness. Left sinus exhibits no maxillary sinus tenderness and no frontal sinus tenderness.  Mouth/Throat: Oropharynx is clear and moist.  Cardiovascular: Normal rate, regular rhythm and normal heart sounds.  Exam reveals no gallop and no friction rub.   No murmur heard. Pulmonary/Chest: Effort normal and breath sounds normal. No respiratory distress. He has no wheezes. He has no rales.  Musculoskeletal: He exhibits no edema.  Neurological: He is alert and oriented to person, place, and time.  Vitals reviewed.     No results found for this or any previous visit (from the past 2160 hour(s)).   Assessment & Plan  There are no diagnoses linked to this encounter.  1. Influenza-like illness  - oseltamivir (TAMIFLU) capsule 75 mg;  Take 1 capsule (75 mg total) by mouth once. - azithromycin (ZITHROMAX) 250 MG tablet; Take 2 tabs on day 1, then 1 tablet daily on days 2-5  Dispense: 6 each; Refill: 0 - Influenza A and B Ag, Immunoassay

## 2016-01-25 NOTE — Patient Instructions (Signed)
To [facility] if symptoms worsen.

## 2016-01-25 NOTE — Addendum Note (Signed)
Addended by: Frederich Cha D on: 01/25/2016 11:16 AM   Modules accepted: Orders

## 2016-02-15 DIAGNOSIS — H35342 Macular cyst, hole, or pseudohole, left eye: Secondary | ICD-10-CM | POA: Diagnosis not present

## 2016-02-15 DIAGNOSIS — H2513 Age-related nuclear cataract, bilateral: Secondary | ICD-10-CM | POA: Diagnosis not present

## 2016-02-15 DIAGNOSIS — H353131 Nonexudative age-related macular degeneration, bilateral, early dry stage: Secondary | ICD-10-CM | POA: Diagnosis not present

## 2016-02-23 ENCOUNTER — Other Ambulatory Visit: Payer: Self-pay | Admitting: Family Medicine

## 2016-02-25 ENCOUNTER — Ambulatory Visit (INDEPENDENT_AMBULATORY_CARE_PROVIDER_SITE_OTHER): Payer: Medicare Other | Admitting: Family Medicine

## 2016-02-25 ENCOUNTER — Encounter: Payer: Self-pay | Admitting: Family Medicine

## 2016-02-25 VITALS — BP 130/65 | HR 60 | Temp 97.5°F | Resp 16 | Ht 75.0 in | Wt 220.0 lb

## 2016-02-25 DIAGNOSIS — E782 Mixed hyperlipidemia: Secondary | ICD-10-CM | POA: Diagnosis not present

## 2016-02-25 DIAGNOSIS — I10 Essential (primary) hypertension: Secondary | ICD-10-CM | POA: Diagnosis not present

## 2016-02-25 DIAGNOSIS — J410 Simple chronic bronchitis: Secondary | ICD-10-CM

## 2016-02-25 DIAGNOSIS — N183 Chronic kidney disease, stage 3 unspecified: Secondary | ICD-10-CM

## 2016-02-25 DIAGNOSIS — I700 Atherosclerosis of aorta: Secondary | ICD-10-CM | POA: Diagnosis not present

## 2016-02-25 DIAGNOSIS — K219 Gastro-esophageal reflux disease without esophagitis: Secondary | ICD-10-CM

## 2016-02-25 DIAGNOSIS — I2510 Atherosclerotic heart disease of native coronary artery without angina pectoris: Secondary | ICD-10-CM

## 2016-02-25 DIAGNOSIS — E118 Type 2 diabetes mellitus with unspecified complications: Secondary | ICD-10-CM

## 2016-02-25 DIAGNOSIS — I6523 Occlusion and stenosis of bilateral carotid arteries: Secondary | ICD-10-CM | POA: Diagnosis not present

## 2016-02-25 NOTE — Progress Notes (Signed)
Name: Keith Cruz   MRN: [id number]    DOB: 1941-03-08   Date:02/25/2016       Progress Note  Subjective  Chief Complaint  Chief Complaint  Patient presents with  . Hypertension  . Hyperlipidemia    HPI Here for f/u of HBP and elevated lipids and pre-diabetes.  He has been trying to lose weight.  He has no c/o.  Taking meds and feeling well. No problem-specific Assessment & Plan notes found for this encounter.   Past Medical History:  Diagnosis Date  . Hypertension   . Kidney disease   . Myocardial infarction   . Seasonal allergies     Past Surgical History:  Procedure Laterality Date  . CORONARY ARTERY BYPASS GRAFT      Family History  Problem Relation Age of Onset  . Stroke Mother   . Heart attack Mother   . Stroke Father   . Stroke Paternal Grandmother   . Stroke Paternal Grandfather     Social History   Social History  . Marital status: Single    Spouse name: N/A  . Number of children: N/A  . Years of education: N/A   Occupational History  . Not on file.   Social History Main Topics  . Smoking status: Former Smoker    Packs/day: 2.00    Years: 10.00    Types: Cigarettes  . Smokeless tobacco: Former Systems developer  . Alcohol use No     Comment: previous  . Drug use: No  . Sexual activity: Not on file   Other Topics Concern  . Not on file   Social History Narrative  . No narrative on file     Current Outpatient Prescriptions:  .  ACCU-CHEK AVIVA PLUS test strip, USE ONE STRIP DAILY, Disp: 100 each, Rfl: 0 .  albuterol (PROVENTIL HFA;VENTOLIN HFA) 108 (90 Base) MCG/ACT inhaler, Inhale 1-2 puffs into the lungs every 4 (four) hours as needed for wheezing or shortness of breath., Disp: 1 Inhaler, Rfl: 11 .  Cholecalciferol (VITAMIN D3) 2000 UNITS capsule, Take 2,000 Units by mouth daily., Disp: , Rfl:  .  clobetasol (TEMOVATE) 0.05 % external solution, Apply 2 application topically as needed., Disp: , Rfl:  .  clopidogrel (PLAVIX) 75 MG tablet, Take 1  tablet (75 mg total) by mouth daily., Disp: 90 tablet, Rfl: 3 .  Dextromethorphan-Guaifenesin (MUCINEX DM) 30-600 MG TB12, TAKE 1 TABLET BY MOUTH 2 (TWO) TIMES DAILY. (Patient taking differently: Take 1 tablet by mouth as needed. TAKE 1 TABLET BY MOUTH 2 (TWO) TIMES DAILY.), Disp: 28 each, Rfl: 0 .  fexofenadine (ALLEGRA) 180 MG tablet, TAKE 1 TABLET BY MOUTH DAILY, Disp: 90 tablet, Rfl: 3 .  fluticasone (FLONASE) 50 MCG/ACT nasal spray, SPRAY TWICE IN EACH NOSTRIL DAILY, Disp: , Rfl: 4 .  fluticasone (FLOVENT HFA) 110 MCG/ACT inhaler, Inhale 2 puffs into the lungs 2 (two) times daily., Disp: 1 Inhaler, Rfl: 12 .  folic acid (FOLVITE) 1 MG tablet, Take 0.8 mg by mouth daily., Disp: , Rfl:  .  metoprolol (LOPRESSOR) 50 MG tablet, Take 1 tablet (50 mg total) by mouth 2 (two) times daily., Disp: 180 tablet, Rfl: 3 .  pantoprazole (PROTONIX) 40 MG tablet, TAKE 1 TABLET BY MOUTH DAILY, Disp: 90 tablet, Rfl: 3 .  simvastatin (ZOCOR) 40 MG tablet, Take 1 tablet (40 mg total) by mouth daily., Disp: 90 tablet, Rfl: 3 .  triamcinolone cream (KENALOG) 0.1 %, Apply 1 application topically as needed., Disp: , Rfl:  .  sildenafil (VIAGRA) 50 MG tablet, Take 1 tablet (50 mg total) by mouth daily as needed for erectile dysfunction. (Patient not taking: Reported on 02/25/2016), Disp: 6 tablet, Rfl: 12  Allergies  Allergen Reactions  . Fexofenadine-Pseudoephed Er Swelling    Tongue and face. Only with D product. Tolerates plain allegra at home.  Tongue and face. Only with D product. Tolerates plain allegra at home.   . Nifedipine Swelling    Tongue and face Tongue and face Tongue and face  . Allegra  [Fexofenadine] Swelling    Tongue and face. Only with D product. Tolerates plain allegra at home.   . Prednisone     Other reaction(s): Other (See Comments) Mental status changes Other reaction(s): Other (See Comments) Mental status changes     Review of Systems  Constitutional: Negative for chills, fever,  malaise/fatigue and weight loss.  HENT: Negative for hearing loss.   Eyes: Negative for blurred vision and double vision.  Respiratory: Negative for cough, shortness of breath and wheezing.   Cardiovascular: Negative for chest pain, palpitations and leg swelling.  Gastrointestinal: Negative for abdominal pain, blood in stool and heartburn.  Genitourinary: Negative for dysuria, frequency and urgency.  Musculoskeletal: Positive for joint pain (R foot pain). Negative for myalgias.  Skin: Negative for rash.  Neurological: Negative for dizziness, tremors, weakness and headaches.      Objective  Vitals:   02/25/16 0807 02/25/16 0846  BP: (!) 144/70 130/65  Pulse: (!) 56 60  Resp: 16   Temp: 97.5 F (36.4 C)   TempSrc: Oral   Weight: 220 lb (99.8 kg)   Height: 6\' 3"  (1.905 m)     Physical Exam  Constitutional: He is oriented to person, place, and time and well-developed, well-nourished, and in no distress. No distress.  HENT:  Head: Normocephalic and atraumatic.  Eyes: Conjunctivae and EOM are normal. Pupils are equal, round, and reactive to light. No scleral icterus.  Neck: Normal range of motion. Neck supple. Carotid bruit is present (faint bilateral carotid bruits). No thyromegaly present.  Cardiovascular: Normal rate, regular rhythm and normal heart sounds.  Exam reveals no gallop and no friction rub.   No murmur heard. Pulmonary/Chest: Effort normal and breath sounds normal. No respiratory distress. He has no wheezes. He has no rales.  Abdominal: Soft. Bowel sounds are normal. He exhibits no distension, no abdominal bruit and no mass. There is no tenderness.  Musculoskeletal: He exhibits no edema.  Lymphadenopathy:    He has no cervical adenopathy.  Neurological: He is alert and oriented to person, place, and time.  Vitals reviewed.      Recent Results (from the past 2160 hour(s))  POCT Influenza A/B     Status: Normal   Collection Time: 01/25/16 11:11 AM  Result  Value Ref Range   Influenza A, POC Negative Negative   Influenza B, POC Negative Negative     Assessment & Plan  Problem List Items Addressed This Visit      Cardiovascular and Mediastinum   Benign essential HTN - Primary   Relevant Orders   COMPLETE METABOLIC PANEL WITH GFR   Arteriosclerosis of coronary artery   Hardening of the aorta (main artery of the heart) (HCC)   Bilateral carotid artery stenosis     Respiratory   Chronic bronchitis (HCC)     Digestive   Acid reflux   Relevant Orders   CBC with Differential     Endocrine   Diabetes mellitus type 2 with complications ([place])  Relevant Orders   HgB A1c     Genitourinary   Chronic kidney disease (CKD), stage III (moderate)     Other   Combined fat and carbohydrate induced hyperlipemia   Relevant Orders   Lipid Profile    Other Visit Diagnoses   None.     No orders of the defined types were placed in this encounter.  1. Benign essential HTN Cont Metoprolol - COMPLETE METABOLIC PANEL WITH GFR  2. Bilateral carotid artery stenosis   3. Hardening of the aorta (main artery of the heart) (HCC) Cont Plavix  4. Arteriosclerosis of coronary artery   5. Simple chronic bronchitis (HCC) Cont inhalers  6. Gastroesophageal reflux disease without esophagitis Cont Protonix - CBC with Differential  7. Type 2 diabetes mellitus with complication, without long-term current use of insulin (HCC)  - HgB A1c  8. Chronic kidney disease (CKD), stage III (moderate)  Cont to follow Nephrologist 9. Combined fat and carbohydrate induced hyperlipemia Cont Simvastatin - Lipid Profile

## 2016-02-26 LAB — COMPLETE METABOLIC PANEL WITH GFR
ALT: 20 U/L (ref 9–46)
AST: 23 U/L (ref 10–35)
Albumin: 4.1 g/dL (ref 3.6–5.1)
Alkaline Phosphatase: 51 U/L (ref 40–115)
BUN: 22 mg/dL (ref 7–25)
CO2: 29 mmol/L (ref 20–31)
Calcium: 9.5 mg/dL (ref 8.6–10.3)
Chloride: 104 mmol/L (ref 98–110)
Creat: 1.32 mg/dL — ABNORMAL HIGH (ref 0.70–1.18)
GFR, Est African American: 61 mL/min (ref >=60)
GFR, Est Non African American: 52 mL/min — ABNORMAL LOW (ref >=60)
Glucose, Bld: 112 mg/dL — ABNORMAL HIGH (ref 65–99)
Potassium: 4.9 mmol/L (ref 3.5–5.3)
Sodium: 140 mmol/L (ref 135–146)
Total Bilirubin: 0.6 mg/dL (ref 0.2–1.2)
Total Protein: 7.5 g/dL (ref 6.1–8.1)

## 2016-02-26 LAB — CBC WITH DIFFERENTIAL/PLATELET
Basophils Absolute: 88 cells/uL (ref 0–200)
Basophils Relative: 1 %
Eosinophils Absolute: 264 cells/uL (ref 15–500)
Eosinophils Relative: 3 %
HCT: 45.3 % (ref 38.5–50.0)
Hemoglobin: 15 g/dL (ref 13.2–17.1)
Lymphocytes Relative: 21 %
Lymphs Abs: 1848 cells/uL (ref 850–3900)
MCH: 33.7 pg — ABNORMAL HIGH (ref 27.0–33.0)
MCHC: 33.1 g/dL (ref 32.0–36.0)
MCV: 101.8 fL — ABNORMAL HIGH (ref 80.0–100.0)
MPV: 9.3 fL (ref 7.5–12.5)
Monocytes Absolute: 880 cells/uL (ref 200–950)
Monocytes Relative: 10 %
Neutro Abs: 5720 cells/uL (ref 1500–7800)
Neutrophils Relative %: 65 %
Platelets: 274 10*3/uL (ref 140–400)
RBC: 4.45 MIL/uL (ref 4.20–5.80)
RDW: 13.2 % (ref 11.0–15.0)
WBC: 8.8 10*3/uL (ref 3.8–10.8)

## 2016-02-26 LAB — LIPID PANEL
Cholesterol: 136 mg/dL (ref 125–200)
HDL: 37 mg/dL — ABNORMAL LOW (ref >=40)
LDL Cholesterol: 72 mg/dL (ref <130)
Total CHOL/HDL Ratio: 3.7 Ratio (ref <=5.0)
Triglycerides: 133 mg/dL (ref <150)
VLDL: 27 mg/dL (ref <30)

## 2016-02-26 LAB — HEMOGLOBIN A1C
Hgb A1c MFr Bld: 6 % — ABNORMAL HIGH (ref <5.7)
Mean Plasma Glucose: 126 mg/dL

## 2016-03-13 ENCOUNTER — Ambulatory Visit: Payer: Medicare Other | Admitting: Family Medicine

## 2016-03-17 ENCOUNTER — Ambulatory Visit (INDEPENDENT_AMBULATORY_CARE_PROVIDER_SITE_OTHER): Payer: Medicare Other | Admitting: Family Medicine

## 2016-03-17 ENCOUNTER — Encounter: Payer: Self-pay | Admitting: Family Medicine

## 2016-03-17 VITALS — BP 106/64 | HR 58 | Temp 98.5°F | Resp 16 | Ht 76.0 in | Wt 219.0 lb

## 2016-03-17 DIAGNOSIS — J209 Acute bronchitis, unspecified: Secondary | ICD-10-CM

## 2016-03-17 MED ORDER — LEVOFLOXACIN 500 MG PO TABS: 500.0000 mg | ORAL_TABLET | Freq: Every day | ORAL | 0 refills | Status: AC

## 2016-03-17 NOTE — Progress Notes (Signed)
Name: Keith Cruz   MRN: [id number]    DOB: 01-Dec-1940   Date:03/17/2016       Progress Note  Subjective  Chief Complaint  Chief Complaint  Patient presents with  . Follow-up    BP  . URI    HPI Supposed to be here for BP check but feeling bad for past 2-3 days with increased clough.  Cough productive of yellow sputum.  Feels weak and lightheaded.  No fever.  Using Flovent inhaler, but not needing Albuterol inhaler at this time.  BPs at home in 130-140 range until last night with feeling bad when BP increased to 170-290 range No problem-specific Assessment & Plan notes found for this encounter.   Past Medical History:  Diagnosis Date  . Hypertension   . Kidney disease   . Myocardial infarction   . Seasonal allergies     Social History  Substance Use Topics  . Smoking status: Former Smoker    Packs/day: 2.00    Years: 10.00    Types: Cigarettes  . Smokeless tobacco: Former Systems developer  . Alcohol use No     Comment: previous     Current Outpatient Prescriptions:  .  ACCU-CHEK AVIVA PLUS test strip, USE ONE STRIP DAILY, Disp: 100 each, Rfl: 0 .  albuterol (PROVENTIL HFA;VENTOLIN HFA) 108 (90 Base) MCG/ACT inhaler, Inhale 1-2 puffs into the lungs every 4 (four) hours as needed for wheezing or shortness of breath., Disp: 1 Inhaler, Rfl: 11 .  Cholecalciferol (VITAMIN D3) 2000 UNITS capsule, Take 2,000 Units by mouth daily., Disp: , Rfl:  .  clobetasol (TEMOVATE) 0.05 % external solution, Apply 2 application topically as needed., Disp: , Rfl:  .  clopidogrel (PLAVIX) 75 MG tablet, Take 1 tablet (75 mg total) by mouth daily., Disp: 90 tablet, Rfl: 3 .  fexofenadine (ALLEGRA) 180 MG tablet, TAKE 1 TABLET BY MOUTH DAILY, Disp: 90 tablet, Rfl: 3 .  fluticasone (FLONASE) 50 MCG/ACT nasal spray, SPRAY 2 SPRAYS DAILY, Disp: 16 g, Rfl: 12 .  fluticasone (FLOVENT HFA) 110 MCG/ACT inhaler, Inhale 2 puffs into the lungs 2 (two) times daily., Disp: 1 Inhaler, Rfl: 12 .  folic acid (FOLVITE) 1  MG tablet, Take 0.8 mg by mouth daily., Disp: , Rfl:  .  guaiFENesin (MUCINEX) 600 MG 12 hr tablet, Take 600 mg by mouth 2 (two) times daily., Disp: , Rfl:  .  metoprolol (LOPRESSOR) 50 MG tablet, Take 1 tablet (50 mg total) by mouth 2 (two) times daily., Disp: 180 tablet, Rfl: 3 .  pantoprazole (PROTONIX) 40 MG tablet, TAKE 1 TABLET BY MOUTH DAILY, Disp: 90 tablet, Rfl: 3 .  simvastatin (ZOCOR) 40 MG tablet, Take 1 tablet (40 mg total) by mouth daily., Disp: 90 tablet, Rfl: 3 .  triamcinolone cream (KENALOG) 0.1 %, Apply 1 application topically as needed., Disp: , Rfl:  .  levofloxacin (LEVAQUIN) 500 MG tablet, Take 1 tablet (500 mg total) by mouth daily., Disp: 10 tablet, Rfl: 0 .  sildenafil (VIAGRA) 50 MG tablet, Take 1 tablet (50 mg total) by mouth daily as needed for erectile dysfunction. (Patient not taking: Reported on 03/17/2016), Disp: 6 tablet, Rfl: 12  Allergies  Allergen Reactions  . Fexofenadine-Pseudoephed Er Swelling    Tongue and face. Only with D product. Tolerates plain allegra at home.  Tongue and face. Only with D product. Tolerates plain allegra at home.   . Nifedipine Swelling    Tongue and face Tongue and face Tongue and face  .  Allegra  [Fexofenadine] Swelling    Tongue and face. Only with D product. Tolerates plain allegra at home.   . Prednisone     Other reaction(s): Other (See Comments) Mental status changes Other reaction(s): Other (See Comments) Mental status changes    Review of Systems  Constitutional: Positive for chills and malaise/fatigue. Negative for fever and weight loss.  HENT: Positive for congestion and ear pain. Negative for hearing loss and tinnitus.   Eyes: Negative for blurred vision and double vision.  Respiratory: Positive for cough and sputum production. Negative for hemoptysis, shortness of breath and wheezing.   Cardiovascular: Negative for chest pain, palpitations and leg swelling.  Gastrointestinal: Negative for abdominal pain,  blood in stool and heartburn.  Genitourinary: Negative for dysuria, frequency and urgency.  Musculoskeletal: Negative for back pain and neck pain.  Skin: Negative for rash.  Neurological: Positive for dizziness. Negative for tingling, tremors and headaches.      Objective  Vitals:   03/17/16 0907 03/17/16 0919  BP: (!) 150/73 106/64  Pulse: (!) 59 (!) 58  Resp: 16   Temp: 98.5 F (36.9 C)   TempSrc: Oral   Weight: 99.3 kg (219 lb)   Height: 6' 4"  (1.93 m)      Physical Exam  Constitutional: He is oriented to person, place, and time and well-developed, well-nourished, and in no distress. No distress.  HENT:  Head: Normocephalic and atraumatic.  Right Ear: External ear normal.  Left Ear: External ear normal.  Nose: Rhinorrhea present. Right sinus exhibits no maxillary sinus tenderness and no frontal sinus tenderness. Left sinus exhibits no maxillary sinus tenderness and no frontal sinus tenderness.  Mouth/Throat: Oropharynx is clear and moist.  Neck: Normal range of motion. Neck supple. No thyromegaly present.  Cardiovascular: Normal rate and regular rhythm.  Exam reveals no gallop and no friction rub.   No murmur heard. Pulmonary/Chest: Effort normal and breath sounds normal. No respiratory distress. He has no wheezes. He has no rales.  Coarse breath sounds with wet cough  Lymphadenopathy:    He has no cervical adenopathy.  Neurological: He is alert and oriented to person, place, and time.  Vitals reviewed.     Recent Results (from the past 2160 hour(s))  POCT Influenza A/B     Status: Normal   Collection Time: 01/25/16 11:11 AM  Result Value Ref Range   Influenza A, POC Negative Negative   Influenza B, POC Negative Negative  HgB A1c     Status: Abnormal   Collection Time: 02/25/16  9:33 AM  Result Value Ref Range   Hgb A1c MFr Bld 6.0 (H) <5.7 %    Comment:   For someone without known diabetes, a hemoglobin A1c value between 5.7% and 6.4% is consistent with  prediabetes and should be confirmed with a follow-up test.   For someone with known diabetes, a value <7% indicates that their diabetes is well controlled. A1c targets should be individualized based on duration of diabetes, age, co-morbid conditions and other considerations.   This assay result is consistent with an increased risk of diabetes.   Currently, no consensus exists regarding use of hemoglobin A1c for diagnosis of diabetes in children.      Mean Plasma Glucose 126 mg/dL  COMPLETE METABOLIC PANEL WITH GFR     Status: Abnormal   Collection Time: 02/25/16  9:33 AM  Result Value Ref Range   Sodium 140 135 - 146 mmol/L   Potassium 4.9 3.5 - 5.3 mmol/L  Chloride 104 98 - 110 mmol/L   CO2 29 20 - 31 mmol/L   Glucose, Bld 112 (H) 65 - 99 mg/dL   BUN 22 7 - 25 mg/dL   Creat 1.32 (H) 0.70 - 1.18 mg/dL    Comment:   For patients > or = 75 years of age: The upper reference limit for Creatinine is approximately 13% higher for people identified as African-American.      Total Bilirubin 0.6 0.2 - 1.2 mg/dL   Alkaline Phosphatase 51 40 - 115 U/L   AST 23 10 - 35 U/L   ALT 20 9 - 46 U/L   Total Protein 7.5 6.1 - 8.1 g/dL   Albumin 4.1 3.6 - 5.1 g/dL   Calcium 9.5 8.6 - 10.3 mg/dL   GFR, Est African American 61 >=60 mL/min   GFR, Est Non African American 52 (L) >=60 mL/min  Lipid Profile     Status: Abnormal   Collection Time: 02/25/16  9:33 AM  Result Value Ref Range   Cholesterol 136 125 - 200 mg/dL   Triglycerides 133 <150 mg/dL   HDL 37 (L) >=40 mg/dL   Total CHOL/HDL Ratio 3.7 <=5.0 Ratio   VLDL 27 <30 mg/dL   LDL Cholesterol 72 <130 mg/dL    Comment:   Total Cholesterol/HDL Ratio:CHD Risk                        Coronary Heart Disease Risk Table                                        Men       Women          1/2 Average Risk              3.4        3.3              Average Risk              5.0        4.4           2X Average Risk              9.6        7.1            3X Average Risk             23.4       11.0 Use the calculated Patient Ratio above and the CHD Risk table  to determine the patient's CHD Risk.   CBC with Differential     Status: Abnormal   Collection Time: 02/25/16  9:33 AM  Result Value Ref Range   WBC 8.8 3.8 - 10.8 K/uL   RBC 4.45 4.20 - 5.80 MIL/uL   Hemoglobin 15.0 13.2 - 17.1 g/dL   HCT 45.3 38.5 - 50.0 %   MCV 101.8 (H) 80.0 - 100.0 fL   MCH 33.7 (H) 27.0 - 33.0 pg   MCHC 33.1 32.0 - 36.0 g/dL   RDW 13.2 11.0 - 15.0 %   Platelets 274 140 - 400 K/uL   MPV 9.3 7.5 - 12.5 fL   Neutro Abs 5,720 1,500 - 7,800 cells/uL   Lymphs Abs 1,848 850 - 3,900 cells/uL   Monocytes Absolute 880 200 - 950 cells/uL   Eosinophils Absolute 264 15 -  500 cells/uL   Basophils Absolute 88 0 - 200 cells/uL   Neutrophils Relative % 65 %   Lymphocytes Relative 21 %   Monocytes Relative 10 %   Eosinophils Relative 3 %   Basophils Relative 1 %   Smear Review Criteria for review not met      Assessment & Plan  1. Acute bronchitis, unspecified organism  - levofloxacin (LEVAQUIN) 500 MG tablet; Take 1 tablet (500 mg total) by mouth daily.  Dispense: 10 tablet; Refill: 0 Cont Mucinex tabs twice daily. Cont ProAir and Flovent inhalers.

## 2016-04-08 ENCOUNTER — Encounter (INDEPENDENT_AMBULATORY_CARE_PROVIDER_SITE_OTHER): Payer: Self-pay

## 2016-04-08 ENCOUNTER — Ambulatory Visit (INDEPENDENT_AMBULATORY_CARE_PROVIDER_SITE_OTHER): Payer: Self-pay | Admitting: Vascular Surgery

## 2016-04-09 ENCOUNTER — Other Ambulatory Visit: Payer: Self-pay | Admitting: Family Medicine

## 2016-04-10 ENCOUNTER — Telehealth: Payer: Self-pay | Admitting: Family Medicine

## 2016-04-10 MED ORDER — BENZONATATE 100 MG PO CAPS: 100.0000 mg | ORAL_CAPSULE | Freq: Three times a day (TID) | ORAL | 2 refills | Status: DC | PRN

## 2016-04-10 NOTE — Telephone Encounter (Signed)
OK to refill Benzonatate 100 mg. 1 up to 3 times a day for cough (#30/2 refills).-jh

## 2016-04-10 NOTE — Telephone Encounter (Signed)
Keith Cruz at [facility] called about a refill on benzonatate 100mg .  His call back number is [phone]

## 2016-05-01 ENCOUNTER — Ambulatory Visit (INDEPENDENT_AMBULATORY_CARE_PROVIDER_SITE_OTHER): Payer: Medicare Other | Admitting: Ophthalmology

## 2016-05-16 IMAGING — DX DG CHEST 2V
3 series · 3 of 3 positions shown · non-contrast
Comparison: 10/01/2013

CLINICAL DATA: Positive for flow, productive cough starting [REDACTED],
history of asthma

EXAM:
CHEST  2 VIEW

[chest pa (1 of 2)]
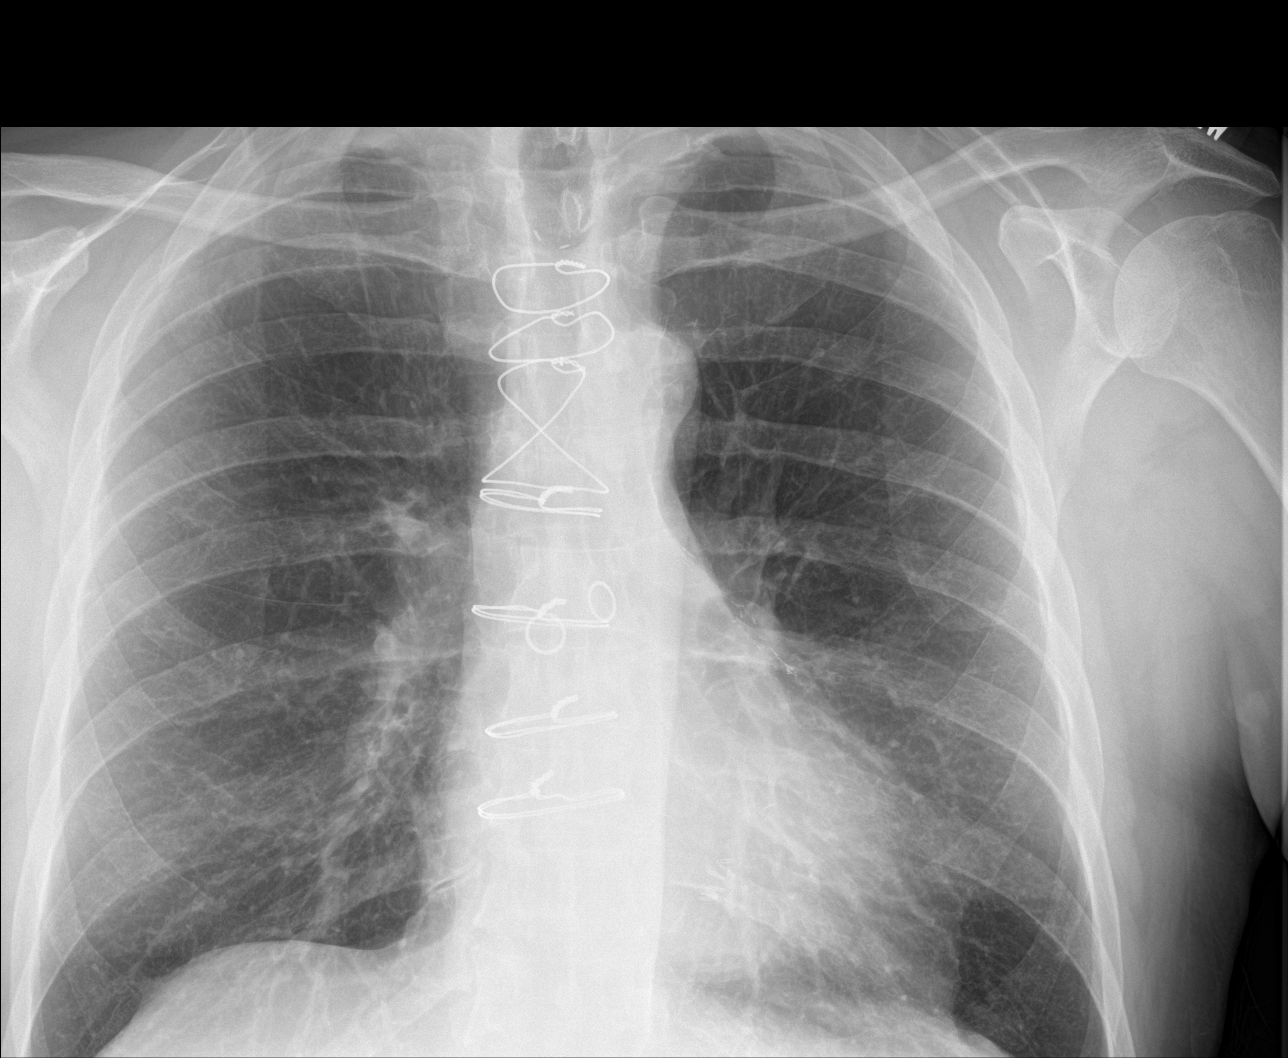

[chest pa (2 of 2)]
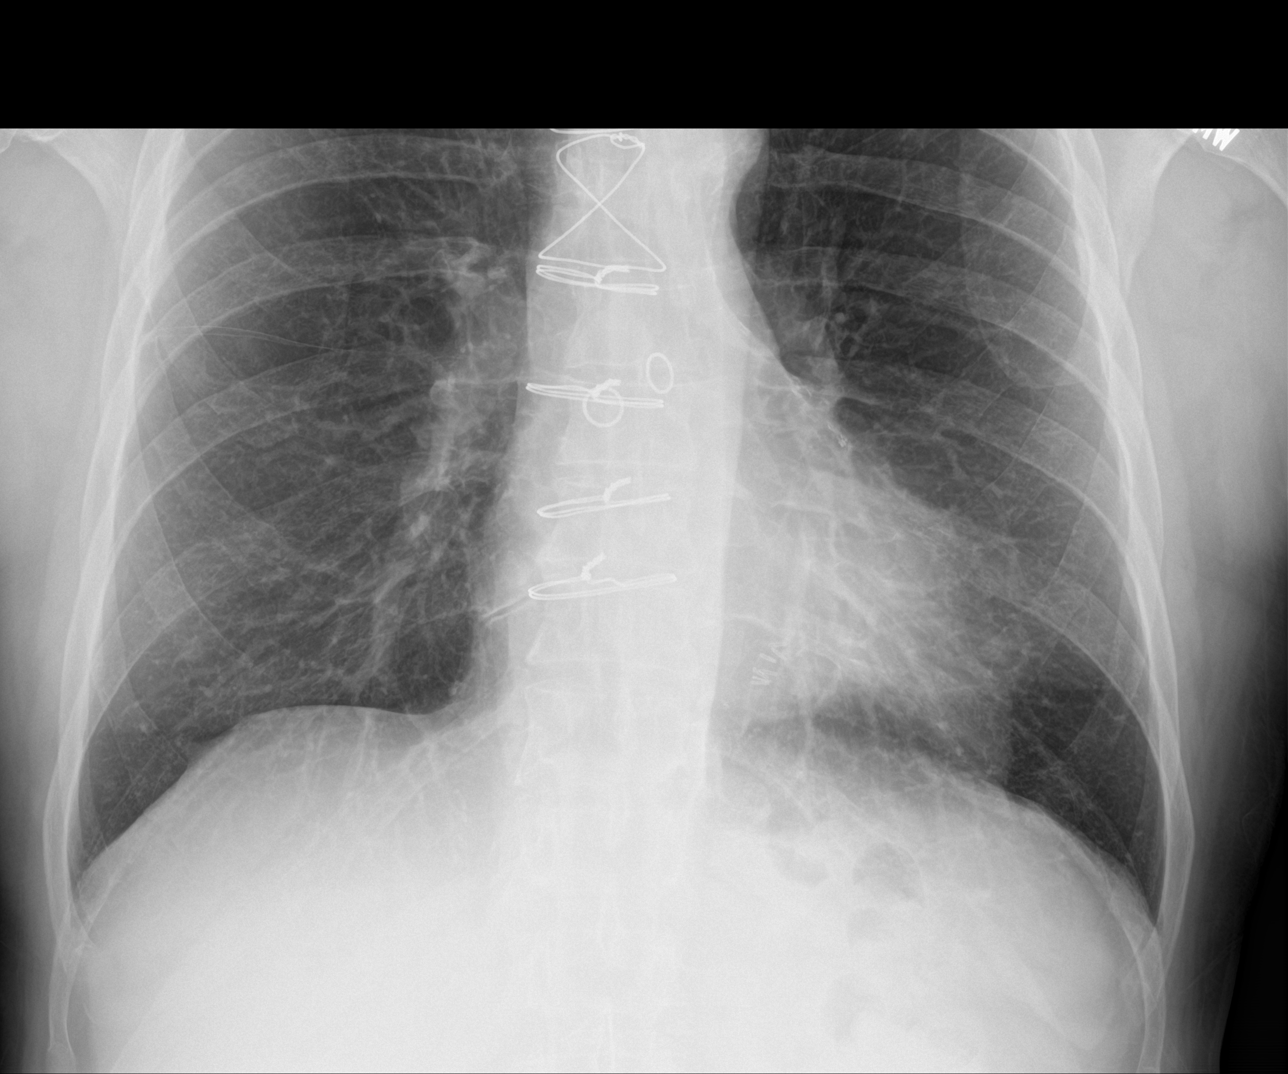

[chest lat]
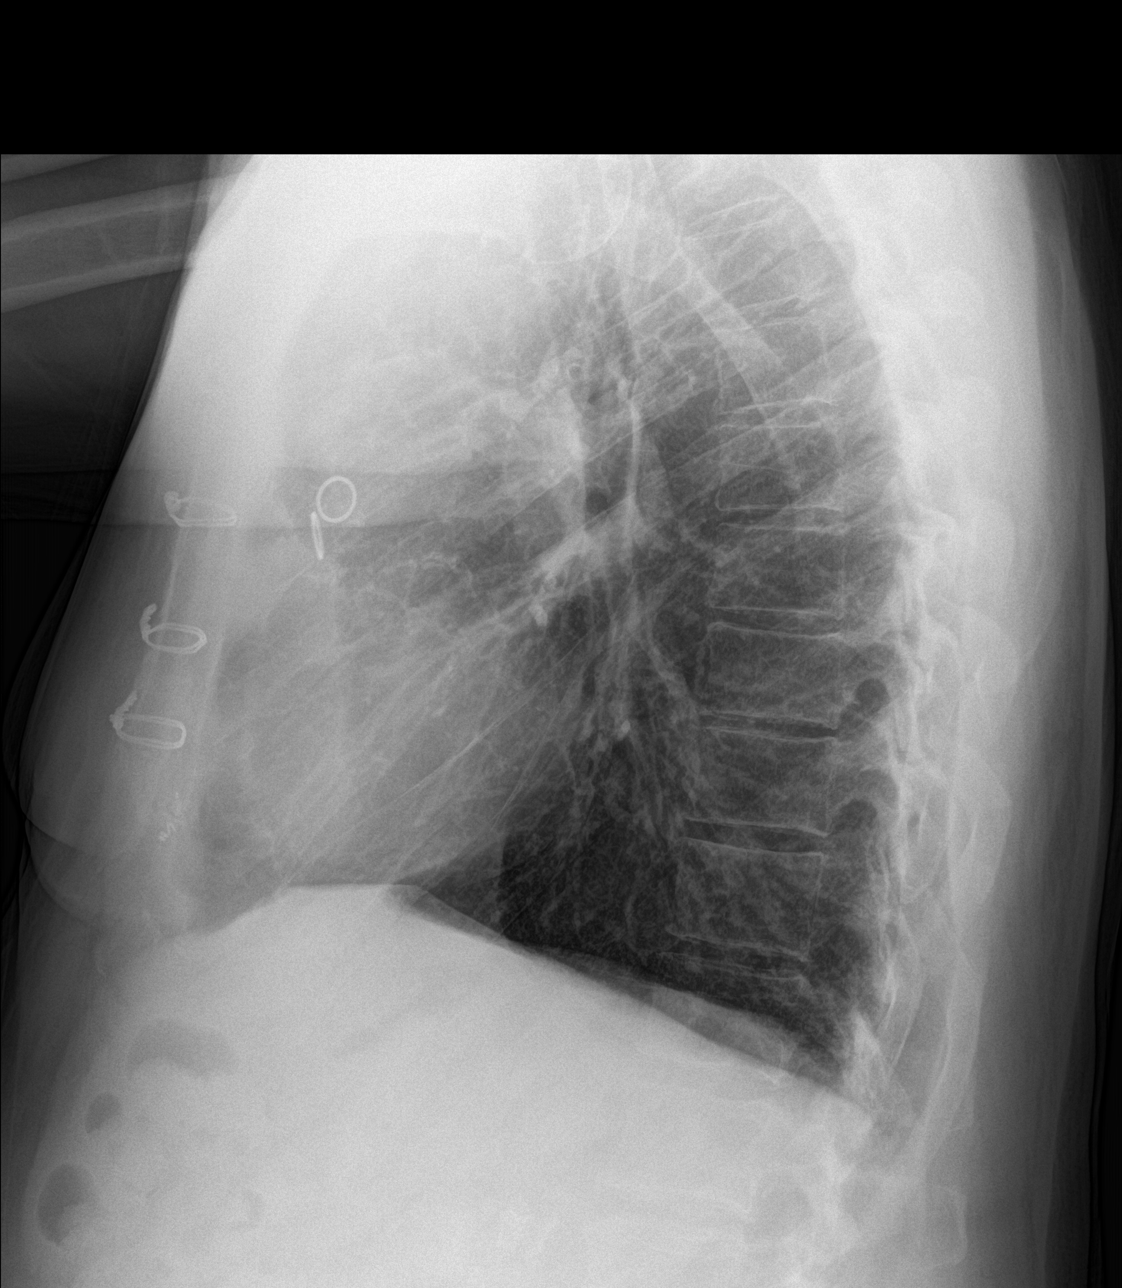

[3 of 3 positions shown; findings below may reference images not displayed]

FINDINGS: Cardiomediastinal silhouette is stable. No acute infiltrate or
pleural effusion. No pulmonary edema. Status post CABG.
IMPRESSION: No active cardiopulmonary disease.  Status post CABG.

## 2016-05-21 ENCOUNTER — Ambulatory Visit (INDEPENDENT_AMBULATORY_CARE_PROVIDER_SITE_OTHER): Payer: Medicare Other | Admitting: Ophthalmology

## 2016-05-21 DIAGNOSIS — I10 Essential (primary) hypertension: Secondary | ICD-10-CM

## 2016-05-21 DIAGNOSIS — H35372 Puckering of macula, left eye: Secondary | ICD-10-CM | POA: Diagnosis not present

## 2016-05-21 DIAGNOSIS — H35033 Hypertensive retinopathy, bilateral: Secondary | ICD-10-CM | POA: Diagnosis not present

## 2016-05-21 DIAGNOSIS — H43813 Vitreous degeneration, bilateral: Secondary | ICD-10-CM | POA: Diagnosis not present

## 2016-05-21 LAB — HM DIABETES EYE EXAM

## 2016-05-28 DIAGNOSIS — I701 Atherosclerosis of renal artery: Secondary | ICD-10-CM | POA: Diagnosis not present

## 2016-05-28 DIAGNOSIS — E1122 Type 2 diabetes mellitus with diabetic chronic kidney disease: Secondary | ICD-10-CM | POA: Diagnosis not present

## 2016-05-28 DIAGNOSIS — I129 Hypertensive chronic kidney disease with stage 1 through stage 4 chronic kidney disease, or unspecified chronic kidney disease: Secondary | ICD-10-CM | POA: Diagnosis not present

## 2016-05-28 DIAGNOSIS — E875 Hyperkalemia: Secondary | ICD-10-CM | POA: Diagnosis not present

## 2016-05-28 DIAGNOSIS — N183 Chronic kidney disease, stage 3 (moderate): Secondary | ICD-10-CM | POA: Diagnosis not present

## 2016-06-24 ENCOUNTER — Other Ambulatory Visit (INDEPENDENT_AMBULATORY_CARE_PROVIDER_SITE_OTHER): Payer: Medicare Other

## 2016-06-24 ENCOUNTER — Ambulatory Visit (INDEPENDENT_AMBULATORY_CARE_PROVIDER_SITE_OTHER): Payer: Medicare Other | Admitting: Vascular Surgery

## 2016-06-24 ENCOUNTER — Encounter (INDEPENDENT_AMBULATORY_CARE_PROVIDER_SITE_OTHER): Payer: Self-pay | Admitting: Vascular Surgery

## 2016-06-24 ENCOUNTER — Other Ambulatory Visit (INDEPENDENT_AMBULATORY_CARE_PROVIDER_SITE_OTHER): Payer: Self-pay | Admitting: Vascular Surgery

## 2016-06-24 VITALS — BP 128/70 | HR 60 | Resp 17 | Wt 223.0 lb

## 2016-06-24 DIAGNOSIS — I701 Atherosclerosis of renal artery: Secondary | ICD-10-CM | POA: Insufficient documentation

## 2016-06-24 DIAGNOSIS — E118 Type 2 diabetes mellitus with unspecified complications: Secondary | ICD-10-CM

## 2016-06-24 DIAGNOSIS — I10 Essential (primary) hypertension: Secondary | ICD-10-CM

## 2016-06-24 DIAGNOSIS — I480 Paroxysmal atrial fibrillation: Secondary | ICD-10-CM

## 2016-06-24 DIAGNOSIS — Z48812 Encounter for surgical aftercare following surgery on the circulatory system: Secondary | ICD-10-CM

## 2016-06-24 DIAGNOSIS — I6523 Occlusion and stenosis of bilateral carotid arteries: Secondary | ICD-10-CM

## 2016-06-24 NOTE — Assessment & Plan Note (Signed)
blood pressure control important in reducing the progression of atherosclerotic disease. On appropriate oral medications.  

## 2016-06-24 NOTE — Progress Notes (Signed)
MRN : [id number]  Keith Cruz is a 76 y.o. (1940/05/14) male who presents with chief complaint of  Chief Complaint  Patient presents with  . Follow-up  .  History of Present Illness: Patient returns in follow-up of multiple vascular issues. He is doing reasonably well without specific complaints today. He says his kidney doctor told him his kidney function was actually up a little bit. He reports his blood pressure control to be stable. He is about 7 years status post right renal artery stent placement which has helped his blood pressure and stability of his renal function. Renal artery duplex today is stable from his study last summer. His right renal artery stent remains patent with only mildly elevated velocities in the left renal artery has velocities consistent with a greater than 60% stenosis. He also follows up today for his carotid disease. He has undergone bilateral carotid endarterectomies in the past. He has no recent focal neurologic symptoms. Specifically, the patient denies amaurosis fugax, speech or swallowing difficulties, or arm or leg weakness or numbness. His carotid duplex today shows some elevated velocities bilaterally consistent with intimal hyperplasia a little worse on the left than the right. These would fall in the 40-59% range on the right and similar on the left although may be closer to 60%. Neither appears to be in high-grade range.  Current Outpatient Prescriptions  Medication Sig Dispense Refill  . ACCU-CHEK AVIVA PLUS test strip USE ONE STRIP DAILY 100 each 0  . albuterol (PROVENTIL HFA;VENTOLIN HFA) 108 (90 Base) MCG/ACT inhaler Inhale 1-2 puffs into the lungs every 4 (four) hours as needed for wheezing or shortness of breath. 1 Inhaler 11  . benzonatate (TESSALON) 100 MG capsule Take 1 capsule (100 mg total) by mouth 3 (three) times daily as needed for cough. 30 capsule 2  . Cholecalciferol (VITAMIN D3) 2000 UNITS capsule Take 2,000 Units by mouth daily.      . clobetasol (TEMOVATE) 0.05 % external solution Apply 2 application topically as needed.    . clopidogrel (PLAVIX) 75 MG tablet Take 1 tablet (75 mg total) by mouth daily. 90 tablet 3  . fexofenadine (ALLEGRA) 180 MG tablet TAKE 1 TABLET BY MOUTH DAILY 90 tablet 3  . fluticasone (FLONASE) 50 MCG/ACT nasal spray SPRAY 2 SPRAYS DAILY 16 g 12  . fluticasone (FLOVENT HFA) 110 MCG/ACT inhaler Inhale 2 puffs into the lungs 2 (two) times daily. 1 Inhaler 12  . folic acid (FOLVITE) 1 MG tablet Take 0.8 mg by mouth daily.    Marland Kitchen guaiFENesin (MUCINEX) 600 MG 12 hr tablet Take 600 mg by mouth 2 (two) times daily.    . metoprolol (LOPRESSOR) 50 MG tablet Take 1 tablet (50 mg total) by mouth 2 (two) times daily. 180 tablet 3  . pantoprazole (PROTONIX) 40 MG tablet TAKE 1 TABLET BY MOUTH DAILY 90 tablet 3  . sildenafil (VIAGRA) 50 MG tablet Take 1 tablet (50 mg total) by mouth daily as needed for erectile dysfunction. 6 tablet 12  . simvastatin (ZOCOR) 40 MG tablet Take 1 tablet (40 mg total) by mouth daily. 90 tablet 3  . triamcinolone cream (KENALOG) 0.1 % Apply 1 application topically as needed.     No current facility-administered medications for this visit.     Past Medical History:  Diagnosis Date  . Hypertension   . Kidney disease   . Myocardial infarction   . Seasonal allergies     Past Surgical History:  Procedure Laterality Date  .  CORONARY ARTERY BYPASS GRAFT      Social History Social History  Substance Use Topics  . Smoking status: Former Smoker    Packs/day: 2.00    Years: 10.00    Types: Cigarettes  . Smokeless tobacco: Former Systems developer  . Alcohol use No     Comment: previous     Family History Family History  Problem Relation Age of Onset  . Stroke Mother   . Heart attack Mother   . Stroke Father   . Stroke Paternal Grandmother   . Stroke Paternal Grandfather     Allergies  Allergen Reactions  . Fexofenadine-Pseudoephed Er Swelling    Tongue and face. Only with D  product. Tolerates plain allegra at home.  Tongue and face. Only with D product. Tolerates plain allegra at home.   . Nifedipine Swelling    Tongue and face Tongue and face Tongue and face  . Allegra  [Fexofenadine] Swelling    Tongue and face. Only with D product. Tolerates plain allegra at home.   . Prednisone     Other reaction(s): Other (See Comments) Mental status changes Other reaction(s): Other (See Comments) Mental status changes     REVIEW OF SYSTEMS (Negative unless checked)  Constitutional: [] Weight loss  [] Fever  [] Chills Cardiac: [] Chest pain   [] Chest pressure   [x] Palpitations   [] Shortness of breath when laying flat   [] Shortness of breath at rest   [x] Shortness of breath with exertion. Vascular:  [] Pain in legs with walking   [] Pain in legs at rest   [] Pain in legs when laying flat   [] Claudication   [] Pain in feet when walking  [] Pain in feet at rest  [] Pain in feet when laying flat   [] History of DVT   [] Phlebitis   [] Swelling in legs   [] Varicose veins   [] Non-healing ulcers Pulmonary:   [] Uses home oxygen   [] Productive cough   [] Hemoptysis   [] Wheeze  [] COPD   [] Asthma Neurologic:  [] Dizziness  [] Blackouts   [] Seizures   [] History of stroke   [] History of TIA  [] Aphasia   [] Temporary blindness   [] Dysphagia   [] Weakness or numbness in arms   [] Weakness or numbness in legs Musculoskeletal:  [] Arthritis   [] Joint swelling   [] Joint pain   [] Low back pain Hematologic:  [] Easy bruising  [] Easy bleeding   [] Hypercoagulable state   [] Anemic  [] Hepatitis Gastrointestinal:  [] Blood in stool   [] Vomiting blood  [] Gastroesophageal reflux/heartburn   [] Difficulty swallowing. Genitourinary:  [] Chronic kidney disease   [] Difficult urination  [] Frequent urination  [] Burning with urination   [] Blood in urine Skin:  [] Rashes   [] Ulcers   [] Wounds Psychological:  [] History of anxiety   []  History of major depression.  Physical Examination  Vitals:   06/24/16 0950  BP: 128/70    Pulse: 60  Resp: 17  Weight: 223 lb (101.2 kg)   Body mass index is 27.14 kg/m. Gen:  WD/WN, NAD Head: [place]/AT, No temporalis wasting. Ear/Nose/Throat: Hearing grossly intact, nares w/o erythema or drainage, trachea midline Eyes: Conjunctiva clear. Sclera non-icteric Neck: Supple.  No JVD.  Pulmonary:  Good air movement, equal and clear to auscultation bilaterally.  Cardiac: somewhat irregular Vascular:  Vessel Right Left  Radial Palpable Palpable          Carotid Palpable, without bruit Palpable, with bruit                       Gastrointestinal: soft, non-tender/non-distended. No guarding/reflex.  Musculoskeletal: M/S 5/5 throughout.  No deformity or atrophy. Neurologic: CN 2-12 intact. Sensation grossly intact in extremities.  Symmetrical.  Speech is fluent. Motor exam as listed above. Psychiatric: Judgment intact, Mood & affect appropriate for pt's clinical situation. Dermatologic: No rashes or ulcers noted.  No cellulitis or open wounds. Lymph : No Cervical, Axillary, or Inguinal lymphadenopathy.     CBC Lab Results  Component Value Date   WBC 8.8 02/25/2016   HGB 15.0 02/25/2016   HCT 45.3 02/25/2016   MCV 101.8 (H) 02/25/2016   PLT 274 02/25/2016    BMET    Component Value Date/Time   NA 140 02/25/2016 0933   NA 140 08/23/2015 0948   NA 140 10/04/2013 0418   K 4.9 02/25/2016 0933   K 4.0 10/04/2013 0418   CL 104 02/25/2016 0933   CL 106 10/04/2013 0418   CO2 29 02/25/2016 0933   CO2 30 10/04/2013 0418   GLUCOSE 112 (H) 02/25/2016 0933   GLUCOSE 108 (H) 10/04/2013 0418   BUN 22 02/25/2016 0933   BUN 19 08/23/2015 0948   BUN 10 10/04/2013 0418   CREATININE 1.32 (H) 02/25/2016 0933   CALCIUM 9.5 02/25/2016 0933   CALCIUM 8.6 10/04/2013 0418   GFRNONAA 52 (L) 02/25/2016 0933   GFRAA 61 02/25/2016 0933   CrCl cannot be calculated (Patient's most recent lab result is older than the maximum 21 days allowed.).  COAG Lab Results  Component Value  Date   INR 1.0 10/01/2013   INR 1.0 11/25/2012    Radiology No results found.    Assessment/Plan Benign essential HTN blood pressure control important in reducing the progression of atherosclerotic disease. On appropriate oral medications.   Diabetes mellitus type 2 with complications blood glucose control important in reducing the progression of atherosclerotic disease. Also, involved in wound healing. On appropriate medications.   Renal artery stenosis (HCC) Renal artery duplex today is stable from his study last summer. His right renal artery stent remains patent with only mildly elevated velocities in the left renal artery has velocities consistent with a greater than 60% stenosis, but with a good blood pressure control and stable renal function we have been monitoring this now for some time. We will continue to follow this on roughly an annual basis.  Bilateral carotid artery stenosis His carotid duplex today shows some elevated velocities bilaterally consistent with intimal hyperplasia a little worse on the left than the right. These would fall in the 40-59% range on the right and similar on the left although may be closer to 60%. Neither appears to be in high-grade range. He will continue his aspirin, Plavix, and Zocor. We will check this in 6 months.    Leotis Pain, MD  06/24/2016 10:55 AM    This note was created with Dragon medical transcription system.  Any errors from dictation are purely unintentional

## 2016-06-24 NOTE — Assessment & Plan Note (Signed)
Renal artery duplex today is stable from his study last summer. His right renal artery stent remains patent with only mildly elevated velocities in the left renal artery has velocities consistent with a greater than 60% stenosis, but with a good blood pressure control and stable renal function we have been monitoring this now for some time. We will continue to follow this on roughly an annual basis.

## 2016-06-24 NOTE — Assessment & Plan Note (Signed)
blood glucose control important in reducing the progression of atherosclerotic disease. Also, involved in wound healing. On appropriate medications.  

## 2016-06-24 NOTE — Assessment & Plan Note (Signed)
His carotid duplex today shows some elevated velocities bilaterally consistent with intimal hyperplasia a little worse on the left than the right. These would fall in the 40-59% range on the right and similar on the left although may be closer to 60%. Neither appears to be in high-grade range. He will continue his aspirin, Plavix, and Zocor. We will check this in 6 months.

## 2016-07-01 ENCOUNTER — Encounter: Payer: Self-pay | Admitting: Family Medicine

## 2016-07-01 ENCOUNTER — Ambulatory Visit (INDEPENDENT_AMBULATORY_CARE_PROVIDER_SITE_OTHER): Payer: Medicare Other | Admitting: Family Medicine

## 2016-07-01 ENCOUNTER — Other Ambulatory Visit: Payer: Self-pay | Admitting: Family Medicine

## 2016-07-01 VITALS — BP 137/84 | HR 56 | Temp 98.1°F | Resp 16 | Ht 76.0 in | Wt 218.0 lb

## 2016-07-01 DIAGNOSIS — N183 Chronic kidney disease, stage 3 unspecified: Secondary | ICD-10-CM

## 2016-07-01 DIAGNOSIS — I10 Essential (primary) hypertension: Secondary | ICD-10-CM

## 2016-07-01 DIAGNOSIS — K219 Gastro-esophageal reflux disease without esophagitis: Secondary | ICD-10-CM

## 2016-07-01 DIAGNOSIS — R7303 Prediabetes: Secondary | ICD-10-CM

## 2016-07-01 DIAGNOSIS — I701 Atherosclerosis of renal artery: Secondary | ICD-10-CM

## 2016-07-01 DIAGNOSIS — I6523 Occlusion and stenosis of bilateral carotid arteries: Secondary | ICD-10-CM | POA: Diagnosis not present

## 2016-07-01 DIAGNOSIS — E7800 Pure hypercholesterolemia, unspecified: Secondary | ICD-10-CM | POA: Diagnosis not present

## 2016-07-01 DIAGNOSIS — J4530 Mild persistent asthma, uncomplicated: Secondary | ICD-10-CM | POA: Diagnosis not present

## 2016-07-01 LAB — COMPLETE METABOLIC PANEL WITH GFR
ALT: 20 U/L (ref 9–46)
AST: 23 U/L (ref 10–35)
Albumin: 4 g/dL (ref 3.6–5.1)
Alkaline Phosphatase: 49 U/L (ref 40–115)
BUN: 20 mg/dL (ref 7–25)
CO2: 27 mmol/L (ref 20–31)
Calcium: 9.2 mg/dL (ref 8.6–10.3)
Chloride: 105 mmol/L (ref 98–110)
Creat: 1.43 mg/dL — ABNORMAL HIGH (ref 0.70–1.18)
GFR, Est African American: 55 mL/min — ABNORMAL LOW (ref >=60)
GFR, Est Non African American: 47 mL/min — ABNORMAL LOW (ref >=60)
Glucose, Bld: 100 mg/dL — ABNORMAL HIGH (ref 65–99)
Potassium: 4.9 mmol/L (ref 3.5–5.3)
Sodium: 140 mmol/L (ref 135–146)
Total Bilirubin: 0.6 mg/dL (ref 0.2–1.2)
Total Protein: 7.2 g/dL (ref 6.1–8.1)

## 2016-07-01 LAB — CBC WITH DIFFERENTIAL/PLATELET
Basophils Absolute: 70 cells/uL (ref 0–200)
Basophils Relative: 1 %
Eosinophils Absolute: 280 cells/uL (ref 15–500)
Eosinophils Relative: 4 %
HCT: 46.2 % (ref 38.5–50.0)
Hemoglobin: 15.6 g/dL (ref 13.2–17.1)
Lymphocytes Relative: 22 %
Lymphs Abs: 1540 cells/uL (ref 850–3900)
MCH: 34.4 pg — ABNORMAL HIGH (ref 27.0–33.0)
MCHC: 33.8 g/dL (ref 32.0–36.0)
MCV: 102 fL — ABNORMAL HIGH (ref 80.0–100.0)
MPV: 9.2 fL (ref 7.5–12.5)
Monocytes Absolute: 560 cells/uL (ref 200–950)
Monocytes Relative: 8 %
Neutro Abs: 4550 cells/uL (ref 1500–7800)
Neutrophils Relative %: 65 %
Platelets: 247 10*3/uL (ref 140–400)
RBC: 4.53 MIL/uL (ref 4.20–5.80)
RDW: 13.2 % (ref 11.0–15.0)
WBC: 7 10*3/uL (ref 3.8–10.8)

## 2016-07-01 LAB — LIPID PANEL
Cholesterol: 136 mg/dL (ref <200)
HDL: 39 mg/dL — ABNORMAL LOW (ref >40)
LDL Cholesterol: 76 mg/dL (ref <100)
Total CHOL/HDL Ratio: 3.5 Ratio (ref <5.0)
Triglycerides: 103 mg/dL (ref <150)
VLDL: 21 mg/dL (ref <30)

## 2016-07-01 MED ORDER — FLUTICASONE PROPIONATE HFA 110 MCG/ACT IN AERO: 2.0000 | INHALATION_SPRAY | Freq: Two times a day (BID) | RESPIRATORY_TRACT | 12 refills | Status: DC

## 2016-07-01 MED ORDER — METOPROLOL TARTRATE 50 MG PO TABS: 50.0000 mg | ORAL_TABLET | Freq: Two times a day (BID) | ORAL | 3 refills | Status: DC

## 2016-07-01 MED ORDER — PANTOPRAZOLE SODIUM 40 MG PO TBEC: 40.0000 mg | DELAYED_RELEASE_TABLET | Freq: Two times a day (BID) | ORAL | 3 refills | Status: DC

## 2016-07-01 MED ORDER — SIMVASTATIN 40 MG PO TABS: 40.0000 mg | ORAL_TABLET | Freq: Every day | ORAL | 3 refills | Status: DC

## 2016-07-01 NOTE — Progress Notes (Signed)
Name: Keith Cruz   MRN: [id number]    DOB: 03/04/1941   Date:07/01/2016       Progress Note  Subjective  Chief Complaint  Chief Complaint  Patient presents with  . Hypertension    HPI  Here for f/u of HBP. GERD, Asthma. And elevated lipids. Also has ASPVD.  Also pre-diabetic.  He has continued GERD.  Needs Protonix daily and also takes Alka-Selterzer every day.  Overall feels ok.  Some R ankle pain sec. To tendernitis No problem-specific Assessment & Plan notes found for this encounter.   Past Medical History:  Diagnosis Date  . Hypertension   . Kidney disease   . Myocardial infarction   . Seasonal allergies     Past Surgical History:  Procedure Laterality Date  . CORONARY ARTERY BYPASS GRAFT      Family History  Problem Relation Age of Onset  . Stroke Mother   . Heart attack Mother   . Stroke Father   . Stroke Paternal Grandmother   . Stroke Paternal Grandfather     Social History   Social History  . Marital status: Single    Spouse name: N/A  . Number of children: N/A  . Years of education: N/A   Occupational History  . Not on file.   Social History Main Topics  . Smoking status: Former Smoker    Packs/day: 2.00    Years: 10.00    Types: Cigarettes  . Smokeless tobacco: Former Systems developer  . Alcohol use No     Comment: previous  . Drug use: No  . Sexual activity: Not on file   Other Topics Concern  . Not on file   Social History Narrative  . No narrative on file     Current Outpatient Prescriptions:  .  ACCU-CHEK AVIVA PLUS test strip, USE ONE STRIP DAILY, Disp: 100 each, Rfl: 0 .  albuterol (PROVENTIL HFA;VENTOLIN HFA) 108 (90 Base) MCG/ACT inhaler, Inhale 1-2 puffs into the lungs every 4 (four) hours as needed for wheezing or shortness of breath., Disp: 1 Inhaler, Rfl: 11 .  Cholecalciferol (VITAMIN D3) 2000 UNITS capsule, Take 2,000 Units by mouth daily., Disp: , Rfl:  .  clopidogrel (PLAVIX) 75 MG tablet, Take 1 tablet (75 mg total) by mouth  daily., Disp: 90 tablet, Rfl: 3 .  fexofenadine (ALLEGRA) 180 MG tablet, TAKE 1 TABLET BY MOUTH DAILY, Disp: 90 tablet, Rfl: 3 .  fluticasone (FLONASE) 50 MCG/ACT nasal spray, SPRAY 2 SPRAYS DAILY (Patient taking differently: SPRAY 2 SPRAYS DAILY PRN), Disp: 16 g, Rfl: 12 .  fluticasone (FLOVENT HFA) 110 MCG/ACT inhaler, Inhale 2 puffs into the lungs 2 (two) times daily., Disp: 1 Inhaler, Rfl: 12 .  folic acid (FOLVITE) 1 MG tablet, Take 0.8 mg by mouth daily., Disp: , Rfl:  .  guaiFENesin (MUCINEX) 600 MG 12 hr tablet, Take 600 mg by mouth 2 (two) times daily., Disp: , Rfl:  .  metoprolol (LOPRESSOR) 50 MG tablet, Take 1 tablet (50 mg total) by mouth 2 (two) times daily., Disp: 180 tablet, Rfl: 3 .  pantoprazole (PROTONIX) 40 MG tablet, Take 1 tablet (40 mg total) by mouth 2 (two) times daily., Disp: 180 tablet, Rfl: 3 .  simvastatin (ZOCOR) 40 MG tablet, Take 1 tablet (40 mg total) by mouth daily., Disp: 90 tablet, Rfl: 3 .  triamcinolone cream (KENALOG) 0.1 %, Apply 1 application topically as needed., Disp: , Rfl:   Allergies  Allergen Reactions  . Fexofenadine-Pseudoephed Er Swelling  Tongue and face. Only with D product. Tolerates plain allegra at home.  Tongue and face. Only with D product. Tolerates plain allegra at home.   . Nifedipine Swelling    Tongue and face Tongue and face Tongue and face  . Allegra  [Fexofenadine] Swelling    Tongue and face. Only with D product. Tolerates plain allegra at home.   . Prednisone     Other reaction(s): Other (See Comments) Mental status changes Other reaction(s): Other (See Comments) Mental status changes     Review of Systems  Constitutional: Negative for chills, fever, malaise/fatigue and weight loss.  HENT: Positive for congestion. Negative for hearing loss and tinnitus.   Eyes: Negative for blurred vision and double vision.  Respiratory: Negative for cough, shortness of breath and wheezing.   Cardiovascular: Negative for chest  pain, palpitations and leg swelling.  Gastrointestinal: Positive for heartburn. Negative for abdominal pain and blood in stool.  Genitourinary: Negative for dysuria, frequency and urgency.  Musculoskeletal: Positive for joint pain (R ankle pain).  Skin: Negative for rash.  Neurological: Negative for dizziness, tingling, tremors, weakness and headaches.      Objective  Vitals:   07/01/16 0810 07/01/16 0847  BP: 137/84   Pulse: (!) 49 (!) 56  Resp: 16   Temp: 98.1 F (36.7 C)   TempSrc: Oral   Weight: 218 lb (98.9 kg)   Height: 6\' 4"  (1.93 m)     Physical Exam  Constitutional: He is oriented to person, place, and time and well-developed, well-nourished, and in no distress. No distress.  HENT:  Head: Normocephalic and atraumatic.  Eyes: Conjunctivae and EOM are normal. Pupils are equal, round, and reactive to light. No scleral icterus.  Neck: Normal range of motion. Neck supple. Carotid bruit is present (faint L carotid bruit). No thyromegaly present.  Cardiovascular: Normal rate and regular rhythm.  Exam reveals no gallop and no friction rub.   No murmur heard. Pulmonary/Chest: Breath sounds normal. No respiratory distress. He has no wheezes. He has no rales.  Abdominal: Soft. Bowel sounds are normal. He exhibits no distension and no mass. There is no tenderness.  Musculoskeletal: He exhibits no edema.  Lymphadenopathy:    He has no cervical adenopathy.  Neurological: He is alert and oriented to person, place, and time.  Vitals reviewed.      Recent Results (from the past 2160 hour(s))  HM DIABETES EYE EXAM     Status: None   Collection Time: 05/21/16  1:49 PM  Result Value Ref Range   HM Diabetic Eye Exam No Retinopathy No Retinopathy  HM DIABETES EYE EXAM     Status: None   Collection Time: 05/21/16  1:56 PM  Result Value Ref Range   HM Diabetic Eye Exam No Retinopathy No Retinopathy     Assessment & Plan  Problem List Items Addressed This Visit       Cardiovascular and Mediastinum   Benign essential HTN - Primary   Relevant Medications   simvastatin (ZOCOR) 40 MG tablet   metoprolol (LOPRESSOR) 50 MG tablet   Other Relevant Orders   COMPLETE METABOLIC PANEL WITH GFR   Carotid artery narrowing   Relevant Medications   simvastatin (ZOCOR) 40 MG tablet   metoprolol (LOPRESSOR) 50 MG tablet   Renal artery stenosis (HCC)   Relevant Medications   simvastatin (ZOCOR) 40 MG tablet   metoprolol (LOPRESSOR) 50 MG tablet     Respiratory   Asthma, mild persistent   Relevant Medications  fluticasone (FLOVENT HFA) 110 MCG/ACT inhaler     Digestive   Acid reflux   Relevant Medications   pantoprazole (PROTONIX) 40 MG tablet   Other Relevant Orders   CBC with Differential     Genitourinary   Chronic kidney disease (CKD), stage III (moderate)     Other   Elevated cholesterol   Relevant Medications   simvastatin (ZOCOR) 40 MG tablet   metoprolol (LOPRESSOR) 50 MG tablet   Other Relevant Orders   Lipid Profile   Pre-diabetes   Relevant Orders   HgB A1c      Meds ordered this encounter  Medications  . simvastatin (ZOCOR) 40 MG tablet    Sig: Take 1 tablet (40 mg total) by mouth daily.    Dispense:  90 tablet    Refill:  3  . pantoprazole (PROTONIX) 40 MG tablet    Sig: Take 1 tablet (40 mg total) by mouth 2 (two) times daily.    Dispense:  180 tablet    Refill:  3  . metoprolol (LOPRESSOR) 50 MG tablet    Sig: Take 1 tablet (50 mg total) by mouth 2 (two) times daily.    Dispense:  180 tablet    Refill:  3  . fluticasone (FLOVENT HFA) 110 MCG/ACT inhaler    Sig: Inhale 2 puffs into the lungs 2 (two) times daily.    Dispense:  1 Inhaler    Refill:  12   1. Benign essential HTN  - metoprolol (LOPRESSOR) 50 MG tablet; Take 1 tablet (50 mg total) by mouth 2 (two) times daily.  Dispense: 180 tablet; Refill: 3 - COMPLETE METABOLIC PANEL WITH GFR  2. Bilateral carotid artery stenosis Cont to see Dr. Lucky Cowboy  3. Renal artery  stenosis ([place])   4. Mild persistent asthma without complication  - fluticasone (FLOVENT HFA) 110 MCG/ACT inhaler; Inhale 2 puffs into the lungs 2 (two) times daily.  Dispense: 1 Inhaler; Refill: 12  5. Gastroesophageal reflux disease without esophagitis  - pantoprazole (PROTONIX) 40 MG tablet; Take 1 tablet (40 mg total) by mouth 2 (two) times daily.  Dispense: 180 tablet; Refill: 3 - CBC with Differential  6. Pre-diabetes  - HgB A1c  7. Chronic kidney disease (CKD), stage III (moderate)   8. Elevated cholesterol  - simvastatin (ZOCOR) 40 MG tablet; Take 1 tablet (40 mg total) by mouth daily.  Dispense: 90 tablet; Refill: 3 - Lipid Profile

## 2016-07-02 LAB — HEMOGLOBIN A1C
Hgb A1c MFr Bld: 6 % — ABNORMAL HIGH (ref <5.7)
Mean Plasma Glucose: 126 mg/dL

## 2016-07-03 ENCOUNTER — Encounter: Payer: Self-pay | Admitting: Family Medicine

## 2016-07-18 DIAGNOSIS — E782 Mixed hyperlipidemia: Secondary | ICD-10-CM | POA: Diagnosis not present

## 2016-07-18 DIAGNOSIS — I10 Essential (primary) hypertension: Secondary | ICD-10-CM | POA: Diagnosis not present

## 2016-07-18 DIAGNOSIS — I6523 Occlusion and stenosis of bilateral carotid arteries: Secondary | ICD-10-CM | POA: Diagnosis not present

## 2016-07-18 DIAGNOSIS — I2510 Atherosclerotic heart disease of native coronary artery without angina pectoris: Secondary | ICD-10-CM | POA: Diagnosis not present

## 2016-08-14 ENCOUNTER — Ambulatory Visit (INDEPENDENT_AMBULATORY_CARE_PROVIDER_SITE_OTHER): Payer: Medicare Other | Admitting: Physician Assistant

## 2016-08-14 ENCOUNTER — Encounter: Payer: Self-pay | Admitting: Physician Assistant

## 2016-08-14 VITALS — BP 119/87 | HR 64 | Temp 98.5°F | Resp 16 | Ht 76.0 in | Wt 219.0 lb

## 2016-08-14 DIAGNOSIS — S30860A Insect bite (nonvenomous) of lower back and pelvis, initial encounter: Secondary | ICD-10-CM | POA: Diagnosis not present

## 2016-08-14 DIAGNOSIS — W57XXXA Bitten or stung by nonvenomous insect and other nonvenomous arthropods, initial encounter: Secondary | ICD-10-CM | POA: Diagnosis not present

## 2016-08-14 MED ORDER — DOXYCYCLINE HYCLATE 100 MG PO TABS: 100.0000 mg | ORAL_TABLET | Freq: Two times a day (BID) | ORAL | 0 refills | Status: DC

## 2016-08-14 NOTE — Progress Notes (Signed)
Subjective:    Patient ID: Keith Cruz, male    DOB: January 04, 1941, 76 y.o.   MRN: [id number]  Keith Cruz is a 76 y.o. male presenting on 08/14/2016 for Tick Removal (onset 9 days had chills 5 days ago but not gone wants to be checked out cleaning with peroxide)   HPI   Keith Cruz is a 76 y/o man with a history of CAD, Type II DM presenting 9 days after a tick bite in his groin. He lives in [place], [place] and was clearing out an old barn where there is a lot of brush. He said he showered 24 hours previously and didn't notice it, then he noticed a tick on his right inguinal region. He pulled it off. He does not have a picture of it. He describes it as round and grey, no spot on its back. About three days later, he said he felt chilly but no fevers. He also felt a little nauseated, but no vomiting. He has not noticed a rash. He does not feel confused and is not having any trouble walking. He has been treating the tick bite with Neosporin and peroxide. He has a dog, who is indoors and has never had problems with ticks. He had a close relative recently diagnosed with RMSF.   Social History  Substance Use Topics  . Smoking status: Former Smoker    Packs/day: 2.00    Years: 10.00    Types: Cigarettes  . Smokeless tobacco: Former Systems developer  . Alcohol use No     Comment: previous    Review of Systems Per HPI unless specifically indicated above     Objective:    BP 119/87   Pulse 64   Temp 98.5 F (36.9 C) (Oral)   Resp 16   Ht _0  (1.93 m)   Wt 219 lb (99.3 kg)   BMI 26.66 kg/m   Wt Readings from Last 3 Encounters:  08/14/16 219 lb (99.3 kg)  07/01/16 218 lb (98.9 kg)  06/24/16 223 lb (101.2 kg)    Physical Exam  Constitutional: He is oriented to person, place, and time. He appears well-developed and well-nourished.  Neck: Normal range of motion and full passive range of motion without pain. Neck supple. No neck rigidity. No edema present. No Brudzinski's sign noted.    Cardiovascular: Normal rate, regular rhythm and normal heart sounds.   Pulmonary/Chest: Effort normal and breath sounds normal.  Lymphadenopathy:    He has no cervical adenopathy.  Neurological: He is alert and oriented to person, place, and time. Coordination normal.  Skin: Skin is warm and dry. There is erythema.  Roung, 1/2 cm in diameter erythematous lesion with some central purulence in right inguinal area. No inguinal lymphadenopathy. No maculopapular or petechial rash. No erythema multiforme or bullseye rash noted.   Results for orders placed or performed in visit on 07/01/16  COMPLETE METABOLIC PANEL WITH GFR  Result Value Ref Range   Sodium 140 135 - 146 mmol/L   Potassium 4.9 3.5 - 5.3 mmol/L   Chloride 105 98 - 110 mmol/L   CO2 27 20 - 31 mmol/L   Glucose, Bld 100 (H) 65 - 99 mg/dL   BUN 20 7 - 25 mg/dL   Creat 1.43 (H) 0.70 - 1.18 mg/dL   Total Bilirubin 0.6 0.2 - 1.2 mg/dL   Alkaline Phosphatase 49 40 - 115 U/L   AST 23 10 - 35 U/L   ALT 20 9 - 46  U/L   Total Protein 7.2 6.1 - 8.1 g/dL   Albumin 4.0 3.6 - 5.1 g/dL   Calcium 9.2 8.6 - 10.3 mg/dL   GFR, Est African American 55 (L) >=60 mL/min   GFR, Est Non African American 47 (L) >=60 mL/min  CBC with Differential/Platelet  Result Value Ref Range   WBC 7.0 3.8 - 10.8 K/uL   RBC 4.53 4.20 - 5.80 MIL/uL   Hemoglobin 15.6 13.2 - 17.1 g/dL   HCT 46.2 38.5 - 50.0 %   MCV 102.0 (H) 80.0 - 100.0 fL   MCH 34.4 (H) 27.0 - 33.0 pg   MCHC 33.8 32.0 - 36.0 g/dL   RDW 13.2 11.0 - 15.0 %   Platelets 247 140 - 400 K/uL   MPV 9.2 7.5 - 12.5 fL   Neutro Abs 4,550 1,500 - 7,800 cells/uL   Lymphs Abs 1,540 850 - 3,900 cells/uL   Monocytes Absolute 560 200 - 950 cells/uL   Eosinophils Absolute 280 15 - 500 cells/uL   Basophils Absolute 70 0 - 200 cells/uL   Neutrophils Relative % 65 %   Lymphocytes Relative 22 %   Monocytes Relative 8 %   Eosinophils Relative 4 %   Basophils Relative 1 %   Smear Review Criteria for review  not met   Lipid panel  Result Value Ref Range   Cholesterol 136 <200 mg/dL   Triglycerides 103 <150 mg/dL   HDL 39 (L) >40 mg/dL   Total CHOL/HDL Ratio 3.5 <5.0 Ratio   VLDL 21 <30 mg/dL   LDL Cholesterol 76 <100 mg/dL  Hemoglobin A1c  Result Value Ref Range   Hgb A1c MFr Bld 6.0 (H) <5.7 %   Mean Plasma Glucose 126 mg/dL      Assessment & Plan:   1. Tick bite, initial encounter  Unclear description of tick. However, due to geographical location, time of year, and patient's viral like symptoms in context of bite, will treat for tick born illness and get titers for RMSF and Lyme.  - doxycycline (VIBRA-TABS) 100 MG tablet; Take 1 tablet (100 mg total) by mouth 2 (two) times daily.  Dispense: 20 tablet; Refill: 0   Follow up plan: Return if symptoms worsen or fail to improve.   Carles Collet, PA-C [facility] 08/14/2016, 10:36 AM

## 2016-08-14 NOTE — Patient Instructions (Signed)
Tick Bite Information Introduction Ticks are insects that attach themselves to the skin. There are many types of ticks. Common types include wood ticks and deer ticks. Sometimes, ticks carry diseases that can make a person very ill. The most common places for ticks to attach themselves are the scalp, neck, armpits, waist, and groin. HOW CAN YOU PREVENT TICK BITES? Take these steps to help prevent tick bites when you are outdoors:  Wear long sleeves and long pants.  Wear white clothes so you can see ticks more easily.  Tuck your pant legs into your socks.  If walking on a trail, stay in the middle of the trail to avoid brushing against bushes.  Avoid walking through areas with long grass.  Put bug spray on all skin that is showing and along boot tops, pant legs, and sleeve cuffs.  Check clothes, hair, and skin often and before going inside.  Brush off any ticks that are not attached.  Take a shower or bath as soon as possible after being outdoors. HOW SHOULD YOU REMOVE A TICK? Ticks should be removed as soon as possible to help prevent diseases. 1. If latex gloves are available, put them on before trying to remove a tick. 2. Use tweezers to grasp the tick as close to the skin as possible. You may also use curved forceps or a tick removal tool. Grasp the tick as close to its head as possible. Avoid grasping the tick on its body. 3. Pull gently upward until the tick lets go. Do not twist the tick or jerk it suddenly. This may break off the tick's head or mouth parts. 4. Do not squeeze or crush the tick's body. This could force disease-carrying fluids from the tick into your body. 5. After the tick is removed, wash the bite area and your hands with soap and water or alcohol. 6. Apply a small amount of antiseptic cream or ointment to the bite site. 7. Wash any tools that were used. Do not try to remove a tick by applying a hot match, petroleum jelly, or fingernail polish to the tick. These  methods do not work. They may also increase the chances of disease being spread from the tick bite. WHEN SHOULD YOU SEEK HELP? Contact your health care provider if you are unable to remove a tick or if a part of the tick breaks off in the skin. After a tick bite, you need to watch for signs and symptoms of diseases that can be spread by ticks. Contact your health care provider if you develop any of the following:  Fever.  Rash.  Redness and puffiness (swelling) in the area of the tick bite.  Tender, puffy lymph glands.  Watery poop (diarrhea).  Weight loss.  Cough.  Feeling more tired than normal (fatigue).  Muscle, joint, or bone pain.  Belly (abdominal) pain.  Headache.  Change in your level of consciousness.  Trouble walking or moving your legs.  Loss of feeling (numbness) in the legs.  Loss of movement (paralysis).  Shortness of breath.  Confusion.  Throwing up (vomiting) many times. This information is not intended to replace advice given to you by your health care provider. Make sure you discuss any questions you have with your health care provider. Document Released: 07/09/2009 Document Revised: 09/20/2015 Document Reviewed: 09/22/2012 Elsevier Interactive Patient Education  2017 [employer]vier Inc.  

## 2016-08-15 LAB — LYME AB/WESTERN BLOT REFLEX: B burgdorferi Ab IgG+IgM: <0.9 Index (ref <0.90)

## 2016-08-16 LAB — ROCKY MTN SPOTTED FVR ABS PNL(IGG+IGM)
RMSF IgG: DETECTED — AB
RMSF IgM: NOT DETECTED

## 2016-08-16 LAB — REFLEX RMSF IGG TITER: RMSF IgG Titer: 1:128 {titer} — ABNORMAL HIGH

## 2016-08-29 ENCOUNTER — Encounter: Payer: Self-pay | Admitting: Family Medicine

## 2016-08-29 ENCOUNTER — Ambulatory Visit (INDEPENDENT_AMBULATORY_CARE_PROVIDER_SITE_OTHER): Payer: Medicare Other | Admitting: Family Medicine

## 2016-08-29 VITALS — BP 136/59 | HR 73 | Temp 97.9°F | Resp 16 | Ht 76.0 in | Wt 218.0 lb

## 2016-08-29 DIAGNOSIS — W57XXXA Bitten or stung by nonvenomous insect and other nonvenomous arthropods, initial encounter: Secondary | ICD-10-CM

## 2016-08-29 DIAGNOSIS — S80862A Insect bite (nonvenomous), left lower leg, initial encounter: Secondary | ICD-10-CM

## 2016-08-29 MED ORDER — TRIAMCINOLONE ACETONIDE 0.5 % EX OINT: 1.0000 "application " | TOPICAL_OINTMENT | Freq: Two times a day (BID) | CUTANEOUS | 0 refills | Status: DC

## 2016-08-29 MED ORDER — DOXYCYCLINE HYCLATE 100 MG PO TABS: 200.0000 mg | ORAL_TABLET | Freq: Once | ORAL | 0 refills | Status: AC

## 2016-08-29 MED ORDER — HYDROXYZINE HCL 10 MG PO TABS: 10.0000 mg | ORAL_TABLET | Freq: Three times a day (TID) | ORAL | 0 refills | Status: DC | PRN

## 2016-08-29 NOTE — Patient Instructions (Signed)
Thank you for coming to the clinic today.  1. - Take Doxycycline 100mg  capsules x 2 pills at ONCE with full glass water, stay upright 30 minutes after, this is preventative dose to be safe to make sure you don't develop a problem - I don't think you need the full treatment course on this one now  2. For itch - Take Hydroxyzine up to 3 times daily, caution if makes you feel groggy or not quite right, then can ONLY take at bedtime for itch - Use topical steroid triamcinolone ointment twice daily for 1-2 weeks  Please schedule a follow-up appointment with Dr. Parks Ranger as needed within 1-2 weeks if not resolved  If you have any other questions or concerns, please feel free to call the clinic or send a message through [place]. You may also schedule an earlier appointment if necessary.  Nobie Putnam, DO [facility]

## 2016-08-29 NOTE — Progress Notes (Signed)
Subjective:    Patient ID: Keith Cruz, male    DOB: 25-Nov-1940, 76 y.o.   MRN: [id number]  Keith Cruz is a 76 y.o. male presenting on 08/29/2016 for Tick Removal  Patient presents for a same day appointment.  HPI   NEW TICK BITE, Left Calf - Recent history, patient seen on 08/14/16 at our office for a Tick Bite in R groin. See note for background information, at that time he believed the tick was on for about 24 hours, and did not notice an extending erythema migrans rash, he was given Doxycycline for 10 days as a precaution, and also tested for Lyme and RMSF titers, results of titers actually showed positive IgG for RMSF indicative of past exposure to RMSF but IgM was negative, without supporting evidence of acute infection, also Lyme Burgdorferi was negative. - Today now he returns with a new tick bite, this time left posterior calf. He states that the initial tick bite is healing and mostly resolved, has not had any rash in R groin, he finished the Doxycycline. He obtained the new tick bite on Monday 08/25/16 he was out in same area cleaning out an old barn with lots of brush, he identified this tick within < 4 hours of bite, and removed the entire tick including head, using peroxide, alcohol and neosporin he has treated the bite. He denies any significant rash or spreading erythema. - Admits to significant itching over this bite, has not put anything else on it, still using some peroxide and neosporin - Denies any fevers/chills, nausea, vomiting, headache, leg swelling, spreading redness or rash, drainage of pus, new joint pains  Social History  Substance Use Topics  . Smoking status: Former Smoker    Packs/day: 2.00    Years: 10.00    Types: Cigarettes  . Smokeless tobacco: Former Systems developer  . Alcohol use No     Comment: previous    Review of Systems Per HPI unless specifically indicated above     Objective:    BP (!) 136/59   Pulse 73   Temp 97.9 F (36.6 C) (Oral)   Resp  16   Ht 6\' 4"  (1.93 m)   Wt 218 lb (98.9 kg)   BMI 26.54 kg/m   Wt Readings from Last 3 Encounters:  08/29/16 218 lb (98.9 kg)  08/14/16 219 lb (99.3 kg)  07/01/16 218 lb (98.9 kg)    Physical Exam  Constitutional: He is oriented to person, place, and time. He appears well-developed and well-nourished. No distress.  Well-appearing, comfortable, cooperative  HENT:  Head: Normocephalic and atraumatic.  Mouth/Throat: Oropharynx is clear and moist.  Eyes: Conjunctivae are normal. Right eye exhibits no discharge. Left eye exhibits no discharge.  Neck: Normal range of motion. Neck supple.  Cardiovascular: Normal rate.   Musculoskeletal: He exhibits no edema.  Lymphadenopathy:    He has no cervical adenopathy.  Neurological: He is alert and oriented to person, place, and time.  Skin: Skin is warm and dry. He is not diaphoretic.  Left posterior calf with new tick bite, small amount of scab over central bite with about 1 cm around faint surrounding erythema, without extending rash and no presence of erythema migrans rash. Non tender, no induration, no drainage. No retained tick parts obvious.  Also R inguinal region with small 0.5 cm area of soft fibrotic healing tissue at center of bite without significant erythema or rash  Nursing note and vitals reviewed.   Right Inguinal, healing  tick bite (OLD 08/14/16)    Left Calf, Posterior Tick Bite (NEW 08/25/16)     Results for orders placed or performed in visit on 08/14/16  Rocky mtn spotted fvr abs pnl(IgG+IgM)  Result Value Ref Range   RMSF IgG DETECTED (A) NOT DETECTED   RMSF IgM NOT DETECTED NOT DETECTED  Lyme Ab/Western Blot Reflex  Result Value Ref Range   B burgdorferi Ab IgG+IgM <0.90 <0.90 Index  reflex RMSF IgG Titer  Result Value Ref Range   RMSF IgG Titer 1:128 (H) <1:64 titer      Assessment & Plan:   Problem List Items Addressed This Visit    None    Visit Diagnoses    Tick bite of calf, left, initial encounter     -  Primary New tick bite, Left posterior calf, tick on for < 4 hours, removed entire tick, without complication or significant rash, no evidence erythema migrans, only localized < 1 cm minimal erythema now 4 days later. No systemic symptoms. Only significant itching. - Recent Tick Bite R groin is resolving, without rash or complication, s/p Doxycycline for 10 days (last dose 4/29) day before this new tick bite - Reviewed recent tick borne titers, negative Lyme and negative acute RMSF, had positive RMSF IgG only likely prior exposure  Plan: 1. Agree to give one time dose of Doxycycline 200mg  (x 2 of 100mg  pills) for one dose, precautions given, does not need extended course. 2. Rx Triamcinolone ointment for anti-itch use twice daily for 7-10 days 3. Also given Hydroxyzine 10mg  q 8 hr PRN - advised caution in elderly, may use only at night if needed for help sleep and itch, short term 1-2 weeks then stop 4. Follow-up if worsening and develops rash / erythema migrans or other systemic symptoms     Relevant Medications   triamcinolone ointment (KENALOG) 0.5 %   hydrOXYzine (ATARAX/VISTARIL) 10 MG tablet   doxycycline (VIBRA-TABS) 100 MG tablet      Meds ordered this encounter  Medications  . triamcinolone ointment (KENALOG) 0.5 %    Sig: Apply 1 application topically 2 (two) times daily. For 7-10 days on lower leg tick bite, for itch    Dispense:  30 g    Refill:  0  . hydrOXYzine (ATARAX/VISTARIL) 10 MG tablet    Sig: Take 1 tablet (10 mg total) by mouth every 8 (eight) hours as needed for itching. If difficulty tolerating, then can take only at night    Dispense:  30 tablet    Refill:  0  . doxycycline (VIBRA-TABS) 100 MG tablet    Sig: Take 2 tablets (200 mg total) by mouth once. Take with full glass of water, stay upright 30 min after taking.    Dispense:  2 tablet    Refill:  0      Follow up plan: Return if symptoms worsen or fail to improve, for tick bite.  Nobie Putnam, [facility] 08/29/2016, 11:58 AM

## 2016-09-15 ENCOUNTER — Ambulatory Visit
Admission: RE | Admit: 2016-09-15 | Discharge: 2016-09-15 | Disposition: A | Discharge status: Home / Self Care | Payer: Medicare Other | Source: Ambulatory Visit | Attending: Family Medicine | Admitting: Family Medicine

## 2016-09-15 ENCOUNTER — Ambulatory Visit (INDEPENDENT_AMBULATORY_CARE_PROVIDER_SITE_OTHER): Payer: Medicare Other | Admitting: Family Medicine

## 2016-09-15 ENCOUNTER — Ambulatory Visit: Payer: Medicare Other | Admitting: Family Medicine

## 2016-09-15 ENCOUNTER — Encounter: Payer: Self-pay | Admitting: Family Medicine

## 2016-09-15 VITALS — BP 136/60 | HR 97 | Temp 98.7°F | Resp 16 | Ht 76.0 in | Wt 217.0 lb

## 2016-09-15 DIAGNOSIS — J410 Simple chronic bronchitis: Secondary | ICD-10-CM | POA: Diagnosis present

## 2016-09-15 DIAGNOSIS — J441 Chronic obstructive pulmonary disease with (acute) exacerbation: Secondary | ICD-10-CM | POA: Diagnosis not present

## 2016-09-15 MED ORDER — LEVOFLOXACIN 500 MG PO TABS: 500.0000 mg | ORAL_TABLET | Freq: Every day | ORAL | 0 refills | Status: DC

## 2016-09-15 MED ORDER — IPRATROPIUM BROMIDE 0.06 % NA SOLN: 2.0000 | Freq: Four times a day (QID) | NASAL | 0 refills | Status: DC

## 2016-09-15 NOTE — Patient Instructions (Signed)
Thank you for coming to the clinic today.  1. It sounds like you had an Upper Respiratory Virus that has settled into a Bronchitis, lower respiratory tract infection. I don't have concerns for pneumonia today, and think that this should gradually improve. Once you are feeling better, the cough may take a few weeks to fully resolve. I do hear coarse breath sounds, this may be due to the virus, also could be related to smoking.  - Start with chest x-ray, will notify you of results - Regardless of what results are we will likely send Levaquin 500mg  daily for 1 week  - Use Albuterol inhaler 2 puffs every 4-6 hours around the clock for next 2-3 days, max up to 5 days then use as needed  Continue Flovent 2 puffs twice daily  Start Atrovent nasal spray decongestant 2 sprays in each nostril up to 4 times daily for 7 days  - Start Loratadine (Claritin) 10mg  daily and Flonase 2 sprays in each nostril daily for next 4-6 weeks, then you may stop and use seasonally or as needed - Continue Mucinex for 1 week or less, to help clear the mucus  - Use nasal saline (Simply Saline or Ocean Spray) to flush nasal congestion multiple times a day, may help cough - Drink plenty of fluids to improve congestion  If your symptoms seem to worsen instead of improve over next several days, including significant fever / chills, worsening shortness of breath, worsening wheezing, or nausea / vomiting and can't take medicines - return sooner or go to hospital Emergency Department for more immediate treatment.  Please schedule a follow-up appointment with Dr. Parks Ranger in 1-2 weeks for COPD / Bronchitis if not improved  If you have any other questions or concerns, please feel free to call the clinic or send a message through [place]. You may also schedule an earlier appointment if necessary.  Nobie Putnam, DO Keith Cruz

## 2016-09-15 NOTE — Progress Notes (Addendum)
Subjective:    Patient ID: ARLYNN MCDERMID, male    DOB: 07/26/1940, 76 y.o.   MRN: [id number]  JOVANY DISANO is a 76 y.o. male presenting on 09/15/2016 for Fever (chills onset yesterday,  nasal congetion, runny nose, cough, sorethroat, ear pain onset week)  Patient presents for a same day appointment.  HPI   BRONCHITIS / History of COPD: Reports symptoms started yesterday AM 0300 with sweats with chills, and thicker productive cough with difficulty breathing - Has chronic COPD, has year round cough usually, now more productive - History of PNA in past following worsening episode of bronchitis - Using Flovent 2 puffs BID, doing well. Has not used Albuterol recently - [employer], NyQuil. Last anti-pyretic >5 hours ago with Tylenol x 2, often alternates with Ibuprofen - Cannot take Prednisone due to allergy intolerance, in past usually improved on Levaquin antibiotic - Denies chest pain, dyspnea, headache, nasal vs sinus congestion, ear pain, nausea, vomiting  Social History  Substance Use Topics  . Smoking status: Former Smoker    Packs/day: 2.00    Years: 10.00    Types: Cigarettes  . Smokeless tobacco: Former Systems developer  . Alcohol use No     Comment: previous    Review of Systems Per HPI unless specifically indicated above     Objective:    BP 136/60   Pulse 97   Temp 98.7 F (37.1 C) (Oral)   Resp 16   Ht 6\' 4"  (1.93 m)   Wt 217 lb (98.4 kg)   BMI 26.41 kg/m   Wt Readings from Last 3 Encounters:  09/15/16 217 lb (98.4 kg)  08/29/16 218 lb (98.9 kg)  08/14/16 219 lb (99.3 kg)    Physical Exam  Constitutional: He is oriented to person, place, and time. He appears well-developed and well-nourished. No distress.  Mildly ill-appearing but non toxic, mostly comfortable, cooperative  HENT:  Head: Normocephalic and atraumatic.  Frontal / maxillary sinuses non-tender. Nares patent with some deeper congestion without purulence or edema. Bilateral TMs clear without  erythema, effusion or bulging, some old residual scarring. Oropharynx clear without erythema, exudates, edema or asymmetry.  Eyes: Conjunctivae are normal. Right eye exhibits no discharge. Left eye exhibits no discharge.  Neck: Normal range of motion. Neck supple. No thyromegaly present.  Cardiovascular: Normal rate, regular rhythm, normal heart sounds and intact distal pulses.   No murmur heard. Pulmonary/Chest: Effort normal. No respiratory distress. He has no wheezes.  Some mild coarse breath sounds diffusely without focal wheezing or crackles. Slightly reduced air movement overall but still mostly preserved. Speaks full sentences. No significant coughing spells.  Musculoskeletal: He exhibits no edema.  Lymphadenopathy:    He has no cervical adenopathy.  Neurological: He is alert and oriented to person, place, and time.  Skin: Skin is warm and dry. No rash noted. He is not diaphoretic. No erythema.  Psychiatric: He has a normal mood and affect. His behavior is normal.  Nursing note and vitals reviewed.    I have personally reviewed the radiology report from 09/15/16 Chest X-ray  CLINICAL DATA:  76 year old male with cough for 3 days, initially productive, now nonproductive. Low-grade fever.  EXAM: CHEST  2 VIEW  COMPARISON:  11/19/2015 and earlier.  FINDINGS: Stable cardiac size and mediastinal contours. Prior CABG. Stable lung volumes. The lungs are clear. No pneumothorax or pleural effusion. No acute osseous abnormality identified. Negative visible bowel gas pattern.  IMPRESSION: No acute cardiopulmonary abnormality.   Electronically Signed  By: Genevie Ann M.D.   On: 09/15/2016 10:40  Results for orders placed or performed in visit on 08/14/16  [place] mtn spotted fvr abs pnl(IgG+IgM)  Result Value Ref Range   RMSF IgG DETECTED (A) NOT DETECTED   RMSF IgM NOT DETECTED NOT DETECTED  Lyme Ab/Western Blot Reflex  Result Value Ref Range   B burgdorferi Ab IgG+IgM  <0.90 <0.90 Index  reflex RMSF IgG Titer  Result Value Ref Range   RMSF IgG Titer 1:128 (H) <1:64 titer      Assessment & Plan:   Problem List Items Addressed This Visit    Acute on Chronic bronchitis ([place]) - Primary  Consistent with worsening acute on chronic bronchitis in setting of likely viral URI vs COPD flare. Concern with comorbid diagnoses, at risk for worsening with progression to PNA similar history. - Afebrile, no focal signs of infection (some abnormal coarse breath sounds but not focal) no evidence sinusitis.  Plan: 1. Check STAT CXR today - reviewed results, above, negative for pneumonia or other acute etiology 2. Agree to provide Levaquin antibiotic 500mg  daily x 7 days given extent of illness and concern possible COPD 3. Continue Flovent 2 puffs BID. Increase use temporarily Albuterol inhaler 2 puffs q 4-6 hour x 3-5 days regularly 4. Continue Fexofenadine, Flonase 2 sprays in each nostril daily for next 4-6 weeks, then may stop and use seasonally or as needed 5. Start Atrovent nasal spray decongestant 2 sprays in each nostril up to 4 times daily for 7 days 6. Recommend trial OTC - Mucinex, Tylenol/Ibuprofen PRN, Nasal saline, lozenges, tea with honey/lemo 7. Return criteria reviewed, follow-up within 1-2 week if not improved     Relevant Medications   levofloxacin (LEVAQUIN) 500 MG tablet   Other Relevant Orders   DG Chest 2 View (Completed)      Meds ordered this encounter  Medications  . ipratropium (ATROVENT) 0.06 % nasal spray    Sig: Place 2 sprays into both nostrils 4 (four) times daily. For up to 5-7 days then stop.    Dispense:  15 mL    Refill:  0  . levofloxacin (LEVAQUIN) 500 MG tablet    Sig: Take 1 tablet (500 mg total) by mouth daily. For 7 days    Dispense:  7 tablet    Refill:  0    Follow up plan: Return in about 2 weeks (around 09/29/2016), or if symptoms worsen or fail to improve.  Nobie Putnam, DO [facility] 09/15/2016, 10:25 PM

## 2016-10-03 ENCOUNTER — Encounter: Payer: Self-pay | Admitting: Family Medicine

## 2016-10-03 ENCOUNTER — Ambulatory Visit (INDEPENDENT_AMBULATORY_CARE_PROVIDER_SITE_OTHER): Payer: Medicare Other | Admitting: Family Medicine

## 2016-10-03 ENCOUNTER — Ambulatory Visit: Payer: Medicare Other | Admitting: Family Medicine

## 2016-10-03 VITALS — BP 115/67 | HR 55 | Temp 97.6°F | Resp 16 | Ht 76.0 in | Wt 216.0 lb

## 2016-10-03 DIAGNOSIS — N183 Chronic kidney disease, stage 3 unspecified: Secondary | ICD-10-CM

## 2016-10-03 DIAGNOSIS — J410 Simple chronic bronchitis: Secondary | ICD-10-CM | POA: Diagnosis not present

## 2016-10-03 DIAGNOSIS — I10 Essential (primary) hypertension: Secondary | ICD-10-CM | POA: Diagnosis not present

## 2016-10-03 DIAGNOSIS — R7303 Prediabetes: Secondary | ICD-10-CM | POA: Diagnosis not present

## 2016-10-03 NOTE — Assessment & Plan Note (Addendum)
Stable CKD-III Last chemistry 06/2016, Cr trend 1.3 to 1.6 Followed by Nephrology Dr Candiss Norse [facility]) No change today, will request record from Nephrology on visit 11/19/16, anticipate they are checking urine microalbumin

## 2016-10-03 NOTE — Progress Notes (Signed)
Subjective:    Patient ID: Keith Cruz, male    DOB: Mar 18, 1941, 76 y.o.   MRN: [id number]  JANSEN SCIUTO is a 76 y.o. male presenting on 10/03/2016 for Hypertension (3 month)   HPI   Pre-Diabetes Reports no new concerns. Reviews prior history with elevated sugar and A1c but never above 6.5 in past, has been around 6.0 or less. CBGs: Avg 90s-105. High < 120. Checks CBGs x 2 daily Meds: None Currently not on ACEi/ARB. He used to take Lisinopril in past but discontinued this. Lifestyle: - Diet (tries to limit sugar and carbs)  - Exercise (active with walking) Denies hypoglycemia, polyuria, visual changes, numbness or tingling.  CHRONIC HTN: Reports no new concerns. Does check BP occasionally. Current Meds - Metoprolol 50mg  BID   Reports good compliance, took meds today. Tolerating well, w/o complaints. Denies CP, dyspnea, HA, edema, dizziness / lightheadedness  CKD-III Followed by [facility] Dr Candiss Norse, 11/19/16 is next apt He has no new concerns.  History of COPD / Chronic Bronchitis: - Last seen 5/21 for acute on chronic bronchitis flare. Treated with Levaquin 500mg  daily x 7 days and had Chest X-ray without acute abnormality. Also given Atrovent nasal spray for symptom control - He reports this had resolved, but he has a chronic lingering cough usually, not just recently. But he does not wish to make any med changes at this time. He has considered other inhalers such as Symbicort or Spiriva in past but declines to start. His wife is followed by [facility] Pulmonology Dr Raul Del and is on symbicort - He is using Flovent 2 puffs BID and rarely uses Albuterol - Denies any dyspnea, worsening cough, fevers/chills   Social History  Substance Use Topics  . Smoking status: Former Smoker    Packs/day: 2.00    Years: 10.00    Types: Cigarettes  . Smokeless tobacco: Former Systems developer  . Alcohol use No     Comment: previous    Review of Systems Per HPI unless specifically indicated above       Objective:    BP 115/67   Pulse (!) 55   Temp 97.6 F (36.4 C) (Oral)   Resp 16   Ht 6\' 4"  (1.93 m)   Wt 216 lb (98 kg)   SpO2 95%   BMI 26.29 kg/m   Wt Readings from Last 3 Encounters:  10/03/16 216 lb (98 kg)  09/15/16 217 lb (98.4 kg)  08/29/16 218 lb (98.9 kg)    Physical Exam  Constitutional: He is oriented to person, place, and time. He appears well-developed and well-nourished. No distress.  Well-appearing, comfortable, cooperative  HENT:  Head: Normocephalic and atraumatic.  Mouth/Throat: Oropharynx is clear and moist.  Eyes: Conjunctivae are normal. Right eye exhibits no discharge. Left eye exhibits no discharge.  Neck: Normal range of motion. Neck supple.  Cardiovascular: Normal rate, regular rhythm, normal heart sounds and intact distal pulses.   No murmur heard. Pulmonary/Chest: Effort normal and breath sounds normal. No respiratory distress. He has no wheezes. He has no rales.  Improved overall, good air movement now but some mild diminished air movement bilateral lung bases  Musculoskeletal: He exhibits no edema.  Feet - Slight deformity of R 2nd toe secondary to old injury  Lymphadenopathy:    He has no cervical adenopathy.  Neurological: He is alert and oriented to person, place, and time.  Skin: Skin is warm and dry. No rash noted. He is not diaphoretic. No erythema.  Psychiatric: He  has a normal mood and affect. His behavior is normal.  Well groomed, good eye contact, normal speech and thoughts  Nursing note and vitals reviewed.    Diabetic Foot Exam - Simple   Simple Foot Form Diabetic Foot exam was performed with the following findings:  Yes 10/03/2016 11:24 AM  Visual Inspection No deformities, no ulcerations, no other skin breakdown bilaterally:  Yes See comments:  Yes Sensation Testing Intact to touch and monofilament testing bilaterally:  Yes Pulse Check Posterior Tibialis and Dorsalis pulse intact bilaterally:  Yes Comments Some thickening  bilateral great toenails. History of old lawnmower injury R foot and history plantar fasciitis.  Intact monofilament sensation       Chemistry      Component Value Date/Time   NA 140 07/01/2016 0001   NA 140 08/23/2015 0948   NA 140 10/04/2013 0418   K 4.9 07/01/2016 0001   K 4.0 10/04/2013 0418   CL 105 07/01/2016 0001   CL 106 10/04/2013 0418   CO2 27 07/01/2016 0001   CO2 30 10/04/2013 0418   BUN 20 07/01/2016 0001   BUN 19 08/23/2015 0948   BUN 10 10/04/2013 0418   CREATININE 1.43 (H) 07/01/2016 0001      Component Value Date/Time   CALCIUM 9.2 07/01/2016 0001   CALCIUM 8.6 10/04/2013 0418   ALKPHOS 49 07/01/2016 0001   ALKPHOS 85 10/01/2013 1036   AST 23 07/01/2016 0001   AST 25 10/01/2013 1036   ALT 20 07/01/2016 0001   ALT 23 10/01/2013 1036   BILITOT 0.6 07/01/2016 0001   BILITOT 0.5 08/23/2015 0948   BILITOT 0.4 10/01/2013 1036      Recent Labs  02/25/16 0933 07/01/16 0001  HGBA1C 6.0* 6.0*        Assessment & Plan:   Problem List Items Addressed This Visit    Pre-diabetes - Primary    Well-controlled Pre-DM with A1c 6.01 last x 2 checks - not checked today Concern with CKD-III, HTN, HLD  Plan:  1. Not on any therapy currently - continue lifestyle. Encouraged to improve low carb, low sugar diet, reduce portion size, continue improving regular exercise 3. Follow-up in 3 months Pre-DM A1c, then space visits to q 6 months, next annual phys 06/2017 with fasting labs      Chronic kidney disease (CKD), stage III (moderate)    Stable CKD-III Last chemistry 06/2016, Cr trend 1.3 to 1.6 Followed by Nephrology Dr Candiss Norse [facility]) No change today, will request record from Nephrology on visit 11/19/16, anticipate they are checking urine microalbumin      Chronic bronchitis ([place])    Stable without exacerbation. Last flare 5/21, resolved with levaquin, has allergy to prednisone. - No hypoxia (95% on RA) - Continues on Flovent and PRN  Albuterol  Plan: 1. Continue current therapy 2. Offered add anti-cholinergic inhaler such as Spiriva or other combined therapy if interested for COPD maintenance to help limit chronic cough, he declines today will consider. Also discussed maybe referral to Pulm to Dr Raul Del similar to his wife 3. Follow-up PRN      Benign essential HTN    Well-controlled HTN. Stable reading outside office. Complication with CKD-III, followed by Renal [facility]   Plan:  1. Continue current BP regimen - Metoprolol 50mg  BID - consider low dose ARB in future (will defer to Renal for now and review upcoming office visit report), however caution hypotension 2. Encourage improved lifestyle - low sodium diet, regular exercise 3. Continue monitor  BP outside office, bring readings to next visit, if persistently >140/90 or new symptoms notify office sooner 4. Follow-up q 3-6 months         No orders of the defined types were placed in this encounter.     Follow up plan: Return in about 3 months (around 01/03/2017) for Pre-DM A1c, HTN, CKD, COPD.  Nobie Putnam, DO [facility] 10/03/2016, 12:32 PM

## 2016-10-03 NOTE — Progress Notes (Signed)
Documenting Diabetic Foot Exam / Monofilament testing per [facility] Nurse, received detailed report from home visit on 07/28/16.  Diabetic Foot Exam - Simple   Simple Foot Form Diabetic Foot exam was performed with the following findings:  Yes 07/28/2016  8:21 AM  Visual Inspection See comments:  Yes Sensation Testing See comments:  Yes Pulse Check See comments:  Yes Comments Foot exam performed by [place] Nurse check-up on 07/28/16. Not performed in our office. Reported to have impaired bilateral monofilament sensation testing.    Keith Cruz, [facility] 10/03/2016, 8:22 AM

## 2016-10-03 NOTE — Patient Instructions (Addendum)
Thank you for coming to the clinic today.  1. Pre-Diabetes - No A1c today, last done 06/2016 was 6.0 - We will check A1c sugar every 6 months - Feet normal today without decreased sensation  2. Continue current medications. No change  3. For COPD - chronic bronchitis, if change mind let me know and we can consider SPiriva, Symbicort, or Advair or other inhaler - Or can get referral to Dr Raul Del  4. Please have Dr Candiss Norse - send [place] the record and lab results from 11/19/16  Please schedule a Follow-up Appointment to: Return in about 3 months (around 01/03/2017) for Pre-DM A1c, HTN, CKD, COPD.  If you have any other questions or concerns, please feel free to call the clinic or send a message through [place]. You may also schedule an earlier appointment if necessary.  Nobie Putnam, DO [place]

## 2016-10-03 NOTE — Assessment & Plan Note (Signed)
Well-controlled Pre-DM with A1c 6.01 last x 2 checks - not checked today Concern with CKD-III, HTN, HLD  Plan:  1. Not on any therapy currently - continue lifestyle. Encouraged to improve low carb, low sugar diet, reduce portion size, continue improving regular exercise 3. Follow-up in 3 months Pre-DM A1c, then space visits to q 6 months, next annual phys 06/2017 with fasting labs

## 2016-10-03 NOTE — Assessment & Plan Note (Signed)
Stable without exacerbation. Last flare 5/21, resolved with levaquin, has allergy to prednisone. - No hypoxia (95% on RA) - Continues on Flovent and PRN Albuterol  Plan: 1. Continue current therapy 2. Offered add anti-cholinergic inhaler such as Spiriva or other combined therapy if interested for COPD maintenance to help limit chronic cough, he declines today will consider. Also discussed maybe referral to Pulm to Dr Raul Del similar to his wife 3. Follow-up PRN

## 2016-10-03 NOTE — Assessment & Plan Note (Signed)
Well-controlled HTN. Stable reading outside office. Complication with CKD-III, followed by Renal [facility]   Plan:  1. Continue current BP regimen - Metoprolol 50mg  BID - consider low dose ARB in future (will defer to Renal for now and review upcoming office visit report), however caution hypotension 2. Encourage improved lifestyle - low sodium diet, regular exercise 3. Continue monitor BP outside office, bring readings to next visit, if persistently >140/90 or new symptoms notify office sooner 4. Follow-up q 3-6 months

## 2016-11-17 ENCOUNTER — Ambulatory Visit (INDEPENDENT_AMBULATORY_CARE_PROVIDER_SITE_OTHER): Payer: Medicare Other | Admitting: Ophthalmology

## 2016-11-17 DIAGNOSIS — H43813 Vitreous degeneration, bilateral: Secondary | ICD-10-CM

## 2016-11-17 DIAGNOSIS — H35033 Hypertensive retinopathy, bilateral: Secondary | ICD-10-CM

## 2016-11-17 DIAGNOSIS — H35342 Macular cyst, hole, or pseudohole, left eye: Secondary | ICD-10-CM | POA: Diagnosis not present

## 2016-11-17 DIAGNOSIS — I10 Essential (primary) hypertension: Secondary | ICD-10-CM | POA: Diagnosis not present

## 2016-12-04 ENCOUNTER — Other Ambulatory Visit: Payer: Self-pay

## 2016-12-04 MED ORDER — GLUCOSE BLOOD VI STRP: ORAL_STRIP | 0 refills | Status: DC

## 2016-12-09 ENCOUNTER — Other Ambulatory Visit: Payer: Self-pay

## 2016-12-09 ENCOUNTER — Ambulatory Visit (INDEPENDENT_AMBULATORY_CARE_PROVIDER_SITE_OTHER): Payer: Medicare Other

## 2016-12-09 VITALS — BP 138/68 | HR 68 | Temp 97.8°F | Resp 16 | Ht 76.0 in | Wt 220.0 lb

## 2016-12-09 DIAGNOSIS — Z0000 Encounter for general adult medical examination without abnormal findings: Secondary | ICD-10-CM

## 2016-12-09 DIAGNOSIS — I10 Essential (primary) hypertension: Secondary | ICD-10-CM

## 2016-12-09 MED ORDER — METOPROLOL TARTRATE 50 MG PO TABS: 50.0000 mg | ORAL_TABLET | Freq: Two times a day (BID) | ORAL | 3 refills | Status: DC

## 2016-12-09 NOTE — Patient Instructions (Signed)
Mr. Keith Cruz , Thank you for taking time to come for your Medicare Wellness Visit. I appreciate your ongoing commitment to your health goals. Please review the following plan we discussed and let me know if I can assist you in the future.   Screening recommendations/referrals: Colonoscopy: completed 04/28/2012 Recommended yearly ophthalmology/optometry visit for glaucoma screening and checkup Recommended yearly dental visit for hygiene and checkup  Vaccinations: Influenza vaccine: up to date, due 12/2016 Pneumococcal vaccine: up to date Tdap vaccine: up to date Shingles vaccine: due, check with your insurance company for coverage  Advanced directives: Please bring a copy of your health care power of attorney and living will to the office at your convenience.  Conditions/risks identified: none   Next appointment: Follow up on 01/06/2017 at 8:20am with Dr.Karamalegos. Follow up in one year for your annual wellness exam.   Preventive Care 65 Years and Older, Male Preventive care refers to lifestyle choices and visits with your health care provider that can promote health and wellness. What does preventive care include?  A yearly physical exam. This is also called an annual well check.  Dental exams once or twice a year.  Routine eye exams. Ask your health care provider how often you should have your eyes checked.  Personal lifestyle choices, including:  Daily care of your teeth and gums.  Regular physical activity.  Eating a healthy diet.  Avoiding tobacco and drug use.  Limiting alcohol use.  Practicing safe sex.  Taking low doses of aspirin every day.  Taking vitamin and mineral supplements as recommended by your health care provider. What happens during an annual well check? The services and screenings done by your health care provider during your annual well check will depend on your age, overall health, lifestyle risk factors, and family history of disease. Counseling    Your health care provider may ask you questions about your:  Alcohol use.  Tobacco use.  Drug use.  Emotional well-being.  Home and relationship well-being.  Sexual activity.  Eating habits.  History of falls.  Memory and ability to understand (cognition).  Work and work Statistician. Screening  You may have the following tests or measurements:  Height, weight, and BMI.  Blood pressure.  Lipid and cholesterol levels. These may be checked every 5 years, or more frequently if you are over 20 years old.  Skin check.  Lung cancer screening. You may have this screening every year starting at age 68 if you have a 30-pack-year history of smoking and currently smoke or have quit within the past 15 years.  Fecal occult blood test (FOBT) of the stool. You may have this test every year starting at age 26.  Flexible sigmoidoscopy or colonoscopy. You may have a sigmoidoscopy every 5 years or a colonoscopy every 10 years starting at age 48.  Prostate cancer screening. Recommendations will vary depending on your family history and other risks.  Hepatitis C blood test.  Hepatitis B blood test.  Sexually transmitted disease (STD) testing.  Diabetes screening. This is done by checking your blood sugar (glucose) after you have not eaten for a while (fasting). You may have this done every 1-3 years.  Abdominal aortic aneurysm (AAA) screening. You may need this if you are a current or former smoker.  Osteoporosis. You may be screened starting at age 55 if you are at high risk. Talk with your health care provider about your test results, treatment options, and if necessary, the need for more tests. Vaccines  Your  health care provider may recommend certain vaccines, such as:  Influenza vaccine. This is recommended every year.  Tetanus, diphtheria, and acellular pertussis (Tdap, Td) vaccine. You may need a Td booster every 10 years.  Zoster vaccine. You may need this after age  9.  Pneumococcal 13-valent conjugate (PCV13) vaccine. One dose is recommended after age 54.  Pneumococcal polysaccharide (PPSV23) vaccine. One dose is recommended after age 68. Talk to your health care provider about which screenings and vaccines you need and how often you need them. This information is not intended to replace advice given to you by your health care provider. Make sure you discuss any questions you have with your health care provider. Document Released: 05/11/2015 Document Revised: 01/02/2016 Document Reviewed: 02/13/2015 Elsevier Interactive Patient Education  2017 [place] Prevention in the Home Falls can cause injuries. They can happen to people of all ages. There are many things you can do to make your home safe and to help prevent falls. What can I do on the outside of my home?  Regularly fix the edges of walkways and driveways and fix any cracks.  Remove anything that might make you trip as you walk through a door, such as a raised step or threshold.  Trim any bushes or trees on the path to your home.  Use bright outdoor lighting.  Clear any walking paths of anything that might make someone trip, such as rocks or tools.  Regularly check to see if handrails are loose or broken. Make sure that both sides of any steps have handrails.  Any raised decks and porches should have guardrails on the edges.  Have any leaves, snow, or ice cleared regularly.  Use sand or salt on walking paths during winter.  Clean up any spills in your garage right away. This includes oil or grease spills. What can I do in the bathroom?  Use night lights.  Install grab bars by the toilet and in the tub and shower. Do not use towel bars as grab bars.  Use non-skid mats or decals in the tub or shower.  If you need to sit down in the shower, use a plastic, non-slip stool.  Keep the floor dry. Clean up any water that spills on the floor as soon as it happens.  Remove  soap buildup in the tub or shower regularly.  Attach bath mats securely with double-sided non-slip rug tape.  Do not have throw rugs and other things on the floor that can make you trip. What can I do in the bedroom?  Use night lights.  Make sure that you have a light by your bed that is easy to reach.  Do not use any sheets or blankets that are too big for your bed. They should not hang down onto the floor.  Have a firm chair that has side arms. You can use this for support while you get dressed.  Do not have throw rugs and other things on the floor that can make you trip. What can I do in the kitchen?  Clean up any spills right away.  Avoid walking on wet floors.  Keep items that you use a lot in easy-to-reach places.  If you need to reach something above you, use a strong step stool that has a grab bar.  Keep electrical cords out of the way.  Do not use floor polish or wax that makes floors slippery. If you must use wax, use non-skid floor wax.  Do not  have throw rugs and other things on the floor that can make you trip. What can I do with my stairs?  Do not leave any items on the stairs.  Make sure that there are handrails on both sides of the stairs and use them. Fix handrails that are broken or loose. Make sure that handrails are as long as the stairways.  Check any carpeting to make sure that it is firmly attached to the stairs. Fix any carpet that is loose or worn.  Avoid having throw rugs at the top or bottom of the stairs. If you do have throw rugs, attach them to the floor with carpet tape.  Make sure that you have a light switch at the top of the stairs and the bottom of the stairs. If you do not have them, ask someone to add them for you. What else can I do to help prevent falls?  Wear shoes that:  Do not have high heels.  Have rubber bottoms.  Are comfortable and fit you well.  Are closed at the toe. Do not wear sandals.  If you use a  stepladder:  Make sure that it is fully opened. Do not climb a closed stepladder.  Make sure that both sides of the stepladder are locked into place.  Ask someone to hold it for you, if possible.  Clearly mark and make sure that you can see:  Any grab bars or handrails.  First and last steps.  Where the edge of each step is.  Use tools that help you move around (mobility aids) if they are needed. These include:  Canes.  Walkers.  Scooters.  Crutches.  Turn on the lights when you go into a dark area. Replace any light bulbs as soon as they burn out.  Set up your furniture so you have a clear path. Avoid moving your furniture around.  If any of your floors are uneven, fix them.  If there are any pets around you, be aware of where they are.  Review your medicines with your doctor. Some medicines can make you feel dizzy. This can increase your chance of falling. Ask your doctor what other things that you can do to help prevent falls. This information is not intended to replace advice given to you by your health care provider. Make sure you discuss any questions you have with your health care provider. Document Released: 02/08/2009 Document Revised: 09/20/2015 Document Reviewed: 05/19/2014 Elsevier Interactive Patient Education  2017 [employer].

## 2016-12-09 NOTE — Progress Notes (Signed)
Subjective:   Keith Cruz is a 76 y.o. male who presents for Medicare Annual/Subsequent preventive examination.  Review of Systems:  Cardiac Risk Factors include: advanced age (>47men, >40 women);male gender;dyslipidemia;hypertension;family history of premature cardiovascular disease     Objective:    Vitals: BP 138/68 (BP Location: Left Arm, Patient Position: Sitting)   Pulse 68   Temp 97.8 F (36.6 C)   Resp 16   Ht 6\' 4"  (1.93 m)   Wt 220 lb (99.8 kg)   BMI 26.78 kg/m   Body mass index is 26.78 kg/m.  Tobacco History  Smoking Status  . Former Smoker  . Packs/day: 2.00  . Years: 10.00  . Types: Cigarettes  . Quit date: 04/28/1970  Smokeless Tobacco  . Former Systems developer     Counseling given: Not Answered   Past Medical History:  Diagnosis Date  . Hypertension   . Kidney disease   . Myocardial infarction ([place])   . Seasonal allergies    Past Surgical History:  Procedure Laterality Date  . CORONARY ARTERY BYPASS GRAFT     Family History  Problem Relation Age of Onset  . Stroke Mother   . Heart attack Mother   . Stroke Father   . Stroke Paternal Grandmother   . Stroke Paternal Grandfather    History  Sexual Activity  . Sexual activity: Not on file    Outpatient Encounter Prescriptions as of 12/09/2016  Medication Sig  . albuterol (PROVENTIL HFA;VENTOLIN HFA) 108 (90 Base) MCG/ACT inhaler Inhale 1-2 puffs into the lungs every 4 (four) hours as needed for wheezing or shortness of breath.  . Cholecalciferol (VITAMIN D3) 2000 UNITS capsule Take 2,000 Units by mouth daily.  . Clobetasol Propionate 0.05 % lotion Apply topically.  . clopidogrel (PLAVIX) 75 MG tablet Take 1 tablet (75 mg total) by mouth daily.  . fexofenadine (ALLEGRA) 180 MG tablet TAKE 1 TABLET BY MOUTH DAILY  . fluticasone (FLOVENT HFA) 110 MCG/ACT inhaler Inhale 2 puffs into the lungs 2 (two) times daily.  . folic acid (FOLVITE) 1 MG tablet Take 0.8 mg by mouth daily.  Marland Kitchen glucose blood  (ACCU-CHEK AVIVA PLUS) test strip USE ONE STRIP DAILY  . guaiFENesin (MUCINEX) 600 MG 12 hr tablet Take 600 mg by mouth 2 (two) times daily.  . metoprolol (LOPRESSOR) 50 MG tablet Take 1 tablet (50 mg total) by mouth 2 (two) times daily.  . pantoprazole (PROTONIX) 40 MG tablet Take 1 tablet (40 mg total) by mouth 2 (two) times daily.  . simvastatin (ZOCOR) 40 MG tablet Take 1 tablet (40 mg total) by mouth daily.  . traZODone (DESYREL) 50 MG tablet Take by mouth.  . [DISCONTINUED] fluticasone (FLONASE) 50 MCG/ACT nasal spray SPRAY 2 SPRAYS DAILY (Patient not taking: Reported on 12/09/2016)  . [DISCONTINUED] hydrOXYzine (ATARAX/VISTARIL) 10 MG tablet Take 1 tablet (10 mg total) by mouth every 8 (eight) hours as needed for itching. If difficulty tolerating, then can take only at night (Patient not taking: Reported on 12/09/2016)  . [DISCONTINUED] ipratropium (ATROVENT) 0.06 % nasal spray Place 2 sprays into both nostrils 4 (four) times daily. For up to 5-7 days then stop. (Patient not taking: Reported on 12/09/2016)  . [DISCONTINUED] levofloxacin (LEVAQUIN) 500 MG tablet Take 1 tablet (500 mg total) by mouth daily. For 7 days (Patient not taking: Reported on 12/09/2016)  . [DISCONTINUED] triamcinolone cream (KENALOG) 0.1 % Apply 1 application topically as needed.  . [DISCONTINUED] triamcinolone ointment (KENALOG) 0.5 % Apply 1 application topically  2 (two) times daily. For 7-10 days on lower leg tick bite, for itch (Patient not taking: Reported on 12/09/2016)   No facility-administered encounter medications on file as of 12/09/2016.     Activities of Daily Living In your present state of health, do you have any difficulty performing the following activities: 12/09/2016 07/01/2016  Hearing? Tempie Donning  Vision? Y N  Difficulty concentrating or making decisions? N N  Walking or climbing stairs? N N  Dressing or bathing? N N  Doing errands, shopping? N N  Preparing Food and eating ? N -  Using the Toilet? N -  In  the past six months, have you accidently leaked urine? N -  Do you have problems with loss of bowel control? N -  Managing your Medications? N -  Managing your Finances? N -  Housekeeping or managing your Housekeeping? N -  Some recent data might be hidden    Patient Care Team: Olin Hauser, DO as PCP - General (Family Medicine)   Assessment:     Exercise Activities and Dietary recommendations Current Exercise Habits: Home exercise routine, Type of exercise: walking;strength training/weights, Time (Minutes): 15, Frequency (Times/Week): 2, Weekly Exercise (Minutes/Week): 30, Intensity: Mild, Exercise limited by: None identified  Goals    None     Fall Risk Fall Risk  12/09/2016 07/01/2016 08/23/2015 01/22/2015  Falls in the past year? No No No No   Depression Screen PHQ 2/9 Scores 12/09/2016 07/01/2016 08/23/2015 01/22/2015  PHQ - 2 Score 0 0 0 0    Cognitive Function     6CIT Screen 12/09/2016  What Year? 0 points  What month? 0 points  What time? 0 points  Count back from 20 0 points  Months in reverse 0 points  Repeat phrase 2 points  Total Score 2    Immunization History  Administered Date(s) Administered  . Influenza, High Dose Seasonal PF 01/22/2015  . Influenza-Unspecified 02/23/2015  . Pneumococcal Conjugate-13 12/10/2015   Screening Tests Health Maintenance  Topic Date Due  . INFLUENZA VACCINE  11/26/2016  . URINE MICROALBUMIN  03/17/2017 (Originally 06/03/1950)  . HEMOGLOBIN A1C  01/01/2017  . OPHTHALMOLOGY EXAM  05/21/2017  . FOOT EXAM  10/03/2017  . TETANUS/TDAP  01/27/2023  . PNA vac Low Risk Adult  Completed      Plan:    I have personally reviewed and addressed the Medicare Annual Wellness questionnaire and have noted the following in the patient's chart:  A. Medical and social history B. Use of alcohol, tobacco or illicit drugs  C. Current medications and supplements D. Functional ability and status E.  Nutritional status F.  Physical  activity G. Advance directives H. List of other physicians I.  Hospitalizations, surgeries, and ER visits in previous 12 months J.  [place] such as hearing and vision if needed, cognitive and depression L. Referrals and appointments   In addition, I have reviewed and discussed with patient certain preventive protocols, quality metrics, and best practice recommendations. A written personalized care plan for preventive services as well as general preventive health recommendations were provided to patient.   Signed,  Tyler Aas, LPN Nurse Health Advisor   MD Recommendations:Is having difficulty swallowing at times, he feels he has something stuck in his throat at times. Started about 2 months ago. He is currently on pantaprazole 40mg  BID for indigestion.

## 2016-12-11 LAB — CBC AND DIFFERENTIAL
HCT: 46 (ref 41–53)
Hemoglobin: 14.8 (ref 13.5–17.5)
Neutrophils Absolute: 5
Platelets: 216 (ref 150–399)
WBC: 7.2

## 2016-12-11 LAB — BASIC METABOLIC PANEL
BUN: 13 (ref 4–21)
Creatinine: 1.7 — AB (ref <=1.3)
Glucose: 96
Potassium: 4.5 (ref 3.4–5.3)
Sodium: 142 (ref 137–147)

## 2016-12-11 LAB — PTH, INTACT: PTH, Intact: 16 (ref 15–65)

## 2016-12-11 LAB — HEMOGLOBIN A1C: Hemoglobin A1C: 6.2

## 2016-12-23 ENCOUNTER — Ambulatory Visit (INDEPENDENT_AMBULATORY_CARE_PROVIDER_SITE_OTHER): Payer: Medicare Other

## 2016-12-23 ENCOUNTER — Encounter (INDEPENDENT_AMBULATORY_CARE_PROVIDER_SITE_OTHER): Payer: Self-pay | Admitting: Vascular Surgery

## 2016-12-23 ENCOUNTER — Ambulatory Visit (INDEPENDENT_AMBULATORY_CARE_PROVIDER_SITE_OTHER): Payer: Medicare Other | Admitting: Vascular Surgery

## 2016-12-23 VITALS — BP 129/71 | HR 56 | Resp 17 | Wt 220.0 lb

## 2016-12-23 DIAGNOSIS — I6523 Occlusion and stenosis of bilateral carotid arteries: Secondary | ICD-10-CM

## 2016-12-23 DIAGNOSIS — I10 Essential (primary) hypertension: Secondary | ICD-10-CM | POA: Diagnosis not present

## 2016-12-23 DIAGNOSIS — I701 Atherosclerosis of renal artery: Secondary | ICD-10-CM | POA: Diagnosis not present

## 2016-12-23 DIAGNOSIS — E785 Hyperlipidemia, unspecified: Secondary | ICD-10-CM

## 2016-12-23 NOTE — Assessment & Plan Note (Signed)
His duplex today shows intimal hyperplasia in both of his carotid endarterectomy sites with velocities that would be consistent with 40-59% recurrent stenosis on the right and 60-79% on the left. Neither is high-grade. Neither has significantly progressed from his previous study but both are slightly progressed. Continue Plavix and Zocor. Plan to return in 6 months for follow-up duplex.

## 2016-12-23 NOTE — Progress Notes (Signed)
MRN : [id number]  Keith Cruz is a 76 y.o. (07/19/1940) male who presents with chief complaint of  Chief Complaint  Patient presents with  . ultrasound follow up  .  History of Present Illness: Patient returns in follow-up of his carotid stenosis today. He is doing well. His renal function and blood pressure reasonably stable and he will be having his renal arteries checked again early next year. He denies focal neurologic symptoms recently. Specifically, the patient denies amaurosis fugax, speech or swallowing difficulties, or arm or leg weakness or numbness. His duplex today shows intimal hyperplasia in both of his carotid endarterectomy sites with velocities that would be consistent with 40-59% recurrent stenosis on the right and 60-79% on the left. Neither is high-grade. Neither has significantly progressed from his previous study but both are slightly progressed.  Current Outpatient Prescriptions  Medication Sig Dispense Refill  . ACCU-CHEK AVIVA PLUS test strip USE ONE STRIP DAILY 100 each 0  . albuterol (PROVENTIL HFA;VENTOLIN HFA) 108 (90 Base) MCG/ACT inhaler Inhale 1-2 puffs into the lungs every 4 (four) hours as needed for wheezing or shortness of breath. 1 Inhaler 11  . benzonatate (TESSALON) 100 MG capsule Take 1 capsule (100 mg total) by mouth 3 (three) times daily as needed for cough. 30 capsule 2  . Cholecalciferol (VITAMIN D3) 2000 UNITS capsule Take 2,000 Units by mouth daily.    . clobetasol (TEMOVATE) 0.05 % external solution Apply 2 application topically as needed.    . clopidogrel (PLAVIX) 75 MG tablet Take 1 tablet (75 mg total) by mouth daily. 90 tablet 3  . fexofenadine (ALLEGRA) 180 MG tablet TAKE 1 TABLET BY MOUTH DAILY 90 tablet 3  . fluticasone (FLONASE) 50 MCG/ACT nasal spray SPRAY 2 SPRAYS DAILY 16 g 12  . fluticasone (FLOVENT HFA) 110 MCG/ACT inhaler Inhale 2 puffs into the lungs 2 (two) times daily. 1 Inhaler 12  . folic acid (FOLVITE) 1 MG tablet Take  0.8 mg by mouth daily.    Marland Kitchen guaiFENesin (MUCINEX) 600 MG 12 hr tablet Take 600 mg by mouth 2 (two) times daily.    . metoprolol (LOPRESSOR) 50 MG tablet Take 1 tablet (50 mg total) by mouth 2 (two) times daily. 180 tablet 3  . pantoprazole (PROTONIX) 40 MG tablet TAKE 1 TABLET BY MOUTH DAILY 90 tablet 3  . sildenafil (VIAGRA) 50 MG tablet Take 1 tablet (50 mg total) by mouth daily as needed for erectile dysfunction. 6 tablet 12  . simvastatin (ZOCOR) 40 MG tablet Take 1 tablet (40 mg total) by mouth daily. 90 tablet 3  . triamcinolone cream (KENALOG) 0.1 % Apply 1 application topically as needed.     No current facility-administered medications for this visit.         Past Medical History:  Diagnosis Date  . Hypertension   . Kidney disease   . Myocardial infarction   . Seasonal allergies          Past Surgical History:  Procedure Laterality Date  . CORONARY ARTERY BYPASS GRAFT      Social History       Social History  Substance Use Topics  . Smoking status: Former Smoker    Packs/day: 2.00    Years: 10.00    Types: Cigarettes  . Smokeless tobacco: Former Systems developer  . Alcohol use No     Comment: previous     Family History      Family History  Problem Relation Age of Onset  .  Stroke Mother   . Heart attack Mother   . Stroke Father   . Stroke Paternal Grandmother   . Stroke Paternal Grandfather          Allergies  Allergen Reactions  . Fexofenadine-Pseudoephed Er Swelling    Tongue and face. Only with D product. Tolerates plain allegra at home.  Tongue and face. Only with D product. Tolerates plain allegra at home.   . Nifedipine Swelling    Tongue and face Tongue and face Tongue and face  . Allegra  [Fexofenadine] Swelling    Tongue and face. Only with D product. Tolerates plain allegra at home.   . Prednisone     Other reaction(s): Other (See Comments) Mental status changes Other reaction(s): Other (See  Comments) Mental status changes     REVIEW OF SYSTEMS (Negative unless checked)  Constitutional: [] Weight loss  [] Fever  [] Chills Cardiac: [] Chest pain   [] Chest pressure   [x] Palpitations   [] Shortness of breath when laying flat   [] Shortness of breath at rest   [x] Shortness of breath with exertion. Vascular:  [] Pain in legs with walking   [] Pain in legs at rest   [] Pain in legs when laying flat   [] Claudication   [] Pain in feet when walking  [] Pain in feet at rest  [] Pain in feet when laying flat   [] History of DVT   [] Phlebitis   [] Swelling in legs   [] Varicose veins   [] Non-healing ulcers Pulmonary:   [] Uses home oxygen   [] Productive cough   [] Hemoptysis   [] Wheeze  [] COPD   [] Asthma Neurologic:  [] Dizziness  [] Blackouts   [] Seizures   [] History of stroke   [] History of TIA  [] Aphasia   [] Temporary blindness   [] Dysphagia   [] Weakness or numbness in arms   [] Weakness or numbness in legs Musculoskeletal:  [] Arthritis   [] Joint swelling   [] Joint pain   [] Low back pain Hematologic:  [] Easy bruising  [] Easy bleeding   [] Hypercoagulable state   [] Anemic  [] Hepatitis Gastrointestinal:  [] Blood in stool   [] Vomiting blood  [] Gastroesophageal reflux/heartburn   [] Difficulty swallowing. Genitourinary:  [] Chronic kidney disease   [] Difficult urination  [] Frequent urination  [] Burning with urination   [] Blood in urine Skin:  [] Rashes   [] Ulcers   [] Wounds Psychological:  [] History of anxiety   []  History of major depression.   Physical Examination  Vitals:   12/23/16 0847  BP: 129/71  Pulse: (!) 56  Resp: 17  Weight: 99.8 kg (220 lb)   Body mass index is 26.78 kg/m. Gen:  WD/WN, NAD Head: [place]/AT, No temporalis wasting. Ear/Nose/Throat: Hearing grossly intact, nares w/o erythema or drainage, trachea midline Eyes: Conjunctiva clear. Sclera non-icteric Neck: Supple.  No JVD. Soft left carotid bruit Pulmonary:  Good air movement, equal and clear to auscultation bilaterally.  Cardiac:  Irregular Vascular:  Vessel Right Left  Radial Palpable Palpable                                   Musculoskeletal: M/S 5/5 throughout.  No deformity or atrophy.  Neurologic: CN 2-12 intact. Sensation grossly intact in extremities.  Symmetrical.  Speech is fluent. Motor exam as listed above. Psychiatric: Judgment intact, Mood & affect appropriate for pt's clinical situation. Dermatologic: No rashes or ulcers noted.  No cellulitis or open wounds.      CBC Lab Results  Component Value Date   WBC 7.0 07/01/2016  HGB 15.6 07/01/2016   HCT 46.2 07/01/2016   MCV 102.0 (H) 07/01/2016   PLT 247 07/01/2016    BMET    Component Value Date/Time   NA 140 07/01/2016 0001   NA 140 08/23/2015 0948   NA 140 10/04/2013 0418   K 4.9 07/01/2016 0001   K 4.0 10/04/2013 0418   CL 105 07/01/2016 0001   CL 106 10/04/2013 0418   CO2 27 07/01/2016 0001   CO2 30 10/04/2013 0418   GLUCOSE 100 (H) 07/01/2016 0001   GLUCOSE 108 (H) 10/04/2013 0418   BUN 20 07/01/2016 0001   BUN 19 08/23/2015 0948   BUN 10 10/04/2013 0418   CREATININE 1.43 (H) 07/01/2016 0001   CALCIUM 9.2 07/01/2016 0001   CALCIUM 8.6 10/04/2013 0418   GFRNONAA 47 (L) 07/01/2016 0001   GFRAA 55 (L) 07/01/2016 0001   CrCl cannot be calculated (Patient's most recent lab result is older than the maximum 21 days allowed.).  COAG Lab Results  Component Value Date   INR 1.0 10/01/2013   INR 1.0 11/25/2012    Radiology No results found.    Assessment/Plan Benign essential HTN blood pressure control important in reducing the progression of atherosclerotic disease. On appropriate oral medications.   Diabetes mellitus type 2 with complications blood glucose control important in reducing the progression of atherosclerotic disease. Also, involved in wound healing. On appropriate medications.  Renal artery stenosis (HCC) To be checked early next year  Bilateral carotid artery stenosis His duplex today shows  intimal hyperplasia in both of his carotid endarterectomy sites with velocities that would be consistent with 40-59% recurrent stenosis on the right and 60-79% on the left. Neither is high-grade. Neither has significantly progressed from his previous study but both are slightly progressed. Continue Plavix and Zocor. Plan to return in 6 months for follow-up duplex.    Leotis Pain, MD  12/23/2016 9:16 AM    This note was created with Dragon medical transcription system.  Any errors from dictation are purely unintentional

## 2016-12-23 NOTE — Patient Instructions (Signed)
Carotid Artery Disease The carotid arteries are arteries on both sides of the neck. They carry blood to the brain. Carotid artery disease is when the arteries get smaller (narrow) or get blocked. If these arteries get smaller or get blocked, you are more likely to have a stroke or warning stroke (transient ischemic attack). Follow these instructions at home:  Take medicines as told by your doctor. Make sure you understand all your medicine instructions. Do not stop your medicines without talking to your doctor first.  Follow your doctor's diet instructions. It is important to eat a healthy diet that includes plenty of: ? Fresh fruits. ? Vegetables. ? Lean meats.  Avoid: ? High-fat foods. ? High-sodium foods. ? Foods that are fried, overly processed, or have poor nutritional value.  Stay a healthy weight.  Stay active. Get at least 30 minutes of activity every day.  Do not smoke.  Limit alcohol use to: ? No more than 2 drinks a day for men. ? No more than 1 drink a day for women who are not pregnant.  Do not use illegal drugs.  Keep all doctor visits as told. Get help right away if:  You have sudden weakness or loss of feeling (numbness) on one side of the body, such as the face, arm, or leg.  You have sudden confusion.  You have trouble speaking (aphasia) or understanding.  You have sudden trouble seeing out of one or both eyes.  You have sudden trouble walking.  You have dizziness or feel like you might pass out (faint).  You have a loss of balance or your movements are not steady (uncoordinated).  You have a sudden, severe headache with no known cause.  You have trouble swallowing (dysphagia). Call your local emergency services (911 in [place] Do notdrive yourself to the clinic or hospital. This information is not intended to replace advice given to you by your health care provider. Make sure you discuss any questions you have with your health care  provider. Document Released: 03/31/2012 Document Revised: 09/20/2015 Document Reviewed: 10/13/2012 Elsevier Interactive Patient Education  2018 [employer]  

## 2016-12-23 NOTE — Assessment & Plan Note (Signed)
To be checked early next year. 

## 2016-12-24 ENCOUNTER — Encounter: Payer: Self-pay | Admitting: Family Medicine

## 2017-01-06 ENCOUNTER — Encounter: Payer: Self-pay | Admitting: Family Medicine

## 2017-01-06 ENCOUNTER — Other Ambulatory Visit: Payer: Self-pay | Admitting: Family Medicine

## 2017-01-06 ENCOUNTER — Ambulatory Visit (INDEPENDENT_AMBULATORY_CARE_PROVIDER_SITE_OTHER): Payer: Medicare Other | Admitting: Family Medicine

## 2017-01-06 VITALS — BP 121/74 | HR 59 | Temp 98.1°F | Resp 16 | Ht 76.0 in | Wt 220.6 lb

## 2017-01-06 DIAGNOSIS — N183 Chronic kidney disease, stage 3 unspecified: Secondary | ICD-10-CM

## 2017-01-06 DIAGNOSIS — R718 Other abnormality of red blood cells: Secondary | ICD-10-CM

## 2017-01-06 DIAGNOSIS — R7303 Prediabetes: Secondary | ICD-10-CM | POA: Diagnosis not present

## 2017-01-06 DIAGNOSIS — I10 Essential (primary) hypertension: Secondary | ICD-10-CM

## 2017-01-06 DIAGNOSIS — I701 Atherosclerosis of renal artery: Secondary | ICD-10-CM

## 2017-01-06 DIAGNOSIS — E782 Mixed hyperlipidemia: Secondary | ICD-10-CM

## 2017-01-06 DIAGNOSIS — Z125 Encounter for screening for malignant neoplasm of prostate: Secondary | ICD-10-CM

## 2017-01-06 NOTE — Patient Instructions (Addendum)
Thank you for coming to the clinic today.  1. Keep up the great work!  2. DUE for FASTING BLOOD WORK (no food or drink after midnight before the lab appointment, only water or coffee without cream/sugar on the morning of)  SCHEDULE "Lab Only" visit in the morning at the clinic for lab draw in 6 MONTHS   - Make sure Lab Only appointment is at about 1 week before your next appointment, so that results will be available  For Lab Results, once available within 2-3 days of blood draw, you can can log in to MyChart online to view your results and a brief explanation. Also, we can discuss results at next follow-up visit.  Please schedule a Follow-up Appointment to: Return in about 6 months (around 07/06/2017) for Medicare Physical CPE.  If you have any other questions or concerns, please feel free to call the clinic or send a message through [place]. You may also schedule an earlier appointment if necessary.  Additionally, you may be receiving a survey about your experience at our clinic within a few days to 1 week by e-mail or mail. We value your feedback.  Nobie Putnam, DO [place]

## 2017-01-06 NOTE — Assessment & Plan Note (Signed)
Stable, R RAS s/p R renal stent, in setting of HTN CKD Followed by Vascular Surgery Dr Lucky Cowboy, follow-up in 2019 for re-check stent

## 2017-01-06 NOTE — Assessment & Plan Note (Signed)
Stable CKD-III, RAS s/p R Renal stent Last chemistry 11/2016, Cr trend 1.3 to 1.7 Followed by Nephrology Dr Candiss Norse [facility])

## 2017-01-06 NOTE — Assessment & Plan Note (Addendum)
Well-controlled Pre-DM with A1c 6 to 6.2 for past 1.5 years Concern with CKD-III, HTN, HLD  Plan:  1. Not on any therapy currently - continue lifestyle. Encouraged to improve low carb, low sugar diet, reduce portion size, continue improving regular exercise 2. Follow-up in 6 months for medicare physical and labs - space out A1c to q 6 months

## 2017-01-06 NOTE — Assessment & Plan Note (Signed)
Well-controlled HTN Complication with CKD-III with RAS s/p R renal stent, followed by Renal   Plan:  1. Continue current BP regimen - Metoprolol 50mg  BID - Renal has recommended against starting ARB due to hyperkalemia / CKD 2. Encourage improved lifestyle - low sodium diet, resume regular exercise 3. Continue monitor BP outside office, bring readings to next visit, if persistently >140/90 or new symptoms notify office sooner 4. Follow-up q 6 months lab and physical

## 2017-01-06 NOTE — Progress Notes (Signed)
Subjective:    Patient ID: Keith Cruz, male    DOB: 06-27-1940, 76 y.o.   MRN: [id number]  Keith Cruz is a 76 y.o. male presenting on 01/06/2017 for prediabetes  Accompanied by wife, Keith Cruz.  HPI   Specialist: Nephrology - Dr Candiss Norse ([place]) Vascular - Dr Lucky Cowboy  Pre-Diabetes Reports no new concerns. Prior A1c trend 6.0 to 6.2 CBGs: Avg 90 to 110s. High max 140. 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  CHRONIC HTN: Reports no new concerns. Does check BP occasionally, has not checked recently. Current Meds - Metoprolol 50mg  BID Reports good compliance, took meds today. Tolerating well, w/o complaints. Denies CP, dyspnea, HA, edema, dizziness / lightheadedness  CKD-III / RAS s/p R Renal Stent Followed by [facility] Dr Candiss Norse, last apt 11/2016 and Vascular Surgery Dr Lucky Cowboy for R Renal Stent - He is followed about every 6 months with lab trend of Cr, last was mild elevated - He is off ARB due to hyperkalemia and CKD - Next apt Nephrology in 6 months  Health Maintenance: - received Flu Shot 12/30/16 - UTD otherwise at this time on other HM items  Social History  Substance Use Topics  . Smoking status: Former Smoker    Packs/day: 2.00    Years: 10.00    Types: Cigarettes    Quit date: 04/28/1970  . Smokeless tobacco: Former Systems developer  . Alcohol use No     Comment: previous    Review of Systems Per HPI unless specifically indicated above     Objective:    BP 121/74   Pulse (!) 59   Temp 98.1 F (36.7 C) (Oral)   Resp 16   Ht 6\' 4"  (1.93 m)   Wt 220 lb 9.6 oz (100.1 kg)   BMI 26.85 kg/m   Wt  Readings from Last 3 Encounters:  01/06/17 220 lb 9.6 oz (100.1 kg)  12/23/16 220 lb (99.8 kg)  12/09/16 220 lb (99.8 kg)    Physical Exam  Constitutional: He is oriented to person, place, and time. He appears well-developed and well-nourished. No distress.  Well-appearing, comfortable, cooperative  HENT:  Head: Normocephalic and atraumatic.  Mouth/Throat: Oropharynx is clear and moist.  Frontal / maxillary sinuses non-tender. Nares patent without purulence or edema. Bilateral TMs clear without erythema, effusion or bulging. Oropharynx clear without erythema, exudates, edema or asymmetry.  Eyes: Conjunctivae are normal. Right eye exhibits no discharge. Left eye exhibits no discharge.  Neck: Normal range of motion. Neck supple. No thyromegaly present.  Cardiovascular: Normal rate, regular rhythm, normal heart sounds and intact distal pulses.   No murmur heard. Pulmonary/Chest: Effort normal and breath sounds normal. No respiratory distress. He has no wheezes. He has no rales.  Good air movement. Speaks full sentences.  Musculoskeletal: Normal range of motion. He exhibits no edema.  Lymphadenopathy:    He has no cervical adenopathy.  Neurological: He is alert and oriented to person, place, and time.  Skin: Skin is warm and dry. No rash noted. He is not diaphoretic. No erythema.  Psychiatric: He has a normal mood and affect. His behavior is normal.  Well groomed, good eye contact, normal speech and thoughts  Nursing note and vitals reviewed.      Chemistry      Component Value Date/Time   NA 142 12/11/2016   NA 140 10/04/2013 0418   K 4.5 12/11/2016   K 4.0 10/04/2013 0418   CL 105 07/01/2016 0001   CL 106 10/04/2013 0418   CO2 27 07/01/2016 0001   CO2 30 10/04/2013 0418   BUN 13 12/11/2016   BUN 10 10/04/2013 0418   CREATININE 1.7 (A) 12/11/2016   CREATININE 1.43 (H) 07/01/2016 0001   GLU 96 12/11/2016      Component Value Date/Time   CALCIUM 9.2 07/01/2016 0001   CALCIUM  8.6 10/04/2013 0418   ALKPHOS 49 07/01/2016 0001   ALKPHOS 85 10/01/2013 1036   AST 23 07/01/2016 0001   AST 25 10/01/2013 1036   ALT 20 07/01/2016 0001   ALT 23 10/01/2013 1036   BILITOT 0.6 07/01/2016 0001   BILITOT 0.5 08/23/2015 0948   BILITOT 0.4 10/01/2013 1036      Recent Labs  02/25/16 0933 07/01/16 0001 12/11/16  HGBA1C 6.0* 6.0* 6.2        Assessment & Plan:   Problem List Items Addressed This Visit    Renal artery stenosis ([place])    Stable, R RAS s/p R renal stent, in setting of HTN CKD Followed by Vascular Surgery Dr Lucky Cowboy, follow-up in 2019 for re-check stent      Pre-diabetes - Primary    Well-controlled Pre-DM with A1c 6 to 6.2 for past 1.5 years Concern with CKD-III, HTN, HLD  Plan:  1. Not on any therapy currently - continue lifestyle. Encouraged to improve low carb, low sugar diet, reduce portion size, continue improving regular exercise 2. Follow-up in 6 months for medicare physical and labs - space out A1c to q 6 months      Chronic kidney disease (CKD), stage III (moderate)    Stable CKD-III, RAS s/p R Renal stent Last chemistry 11/2016, Cr trend 1.3 to 1.7 Followed by Nephrology Dr Candiss Norse [facility])      Benign essential HTN    Well-controlled HTN Complication with CKD-III with RAS s/p R renal stent, followed by Renal   Plan:  1. Continue current BP regimen - Metoprolol 50mg  BID - Renal has recommended against starting ARB due to hyperkalemia / CKD 2. Encourage improved lifestyle - low sodium diet, resume regular exercise 3. Continue monitor BP outside office, bring readings to next visit, if persistently >140/90 or new symptoms notify office sooner 4. Follow-up q 6 months lab and physical         No orders of the defined types were placed in this encounter.   Follow up plan: Return in about 6 months (around 07/06/2017) for Medicare Physical CPE.  Nobie Putnam, [facility] 01/06/2017, 9:44 AM

## 2017-01-15 ENCOUNTER — Other Ambulatory Visit: Payer: Self-pay

## 2017-01-15 DIAGNOSIS — I10 Essential (primary) hypertension: Secondary | ICD-10-CM

## 2017-01-22 ENCOUNTER — Other Ambulatory Visit: Payer: Self-pay

## 2017-01-22 DIAGNOSIS — E7800 Pure hypercholesterolemia, unspecified: Secondary | ICD-10-CM

## 2017-01-22 MED ORDER — SIMVASTATIN 40 MG PO TABS: 40.0000 mg | ORAL_TABLET | Freq: Every day | ORAL | 3 refills | Status: DC

## 2017-01-31 ENCOUNTER — Other Ambulatory Visit: Payer: Self-pay | Admitting: Family Medicine

## 2017-01-31 DIAGNOSIS — R7303 Prediabetes: Secondary | ICD-10-CM

## 2017-02-18 ENCOUNTER — Telehealth: Payer: Self-pay | Admitting: Family Medicine

## 2017-02-18 NOTE — Telephone Encounter (Signed)
Pt needs test strips sent to [facility] in [place]

## 2017-02-18 NOTE — Telephone Encounter (Signed)
Test strip were send to [facility] in 10/08. Pt informed.

## 2017-03-10 ENCOUNTER — Encounter: Payer: Self-pay | Admitting: Family Medicine

## 2017-03-10 ENCOUNTER — Ambulatory Visit (INDEPENDENT_AMBULATORY_CARE_PROVIDER_SITE_OTHER): Payer: Medicare Other | Admitting: Family Medicine

## 2017-03-10 VITALS — BP 146/61 | HR 64 | Temp 98.5°F | Resp 16 | Ht 76.0 in | Wt 224.0 lb

## 2017-03-10 DIAGNOSIS — J029 Acute pharyngitis, unspecified: Secondary | ICD-10-CM

## 2017-03-10 DIAGNOSIS — J209 Acute bronchitis, unspecified: Secondary | ICD-10-CM

## 2017-03-10 LAB — POCT RAPID STREP A (OFFICE): Rapid Strep A Screen: NEGATIVE

## 2017-03-10 MED ORDER — IPRATROPIUM BROMIDE 0.06 % NA SOLN: 2.0000 | Freq: Four times a day (QID) | NASAL | 0 refills | Status: DC

## 2017-03-10 MED ORDER — AZITHROMYCIN 250 MG PO TABS: ORAL_TABLET | ORAL | 0 refills | Status: DC

## 2017-03-10 NOTE — Patient Instructions (Addendum)
Thank you for coming to the clinic today.  1.  It sounds like you had an Upper Respiratory Virus that has settled into a Bronchitis, lower respiratory tract infection. I don't have concerns for pneumonia today, and think that this should gradually improve. Once you are feeling better, the cough may take a few weeks to fully resolve. I do hear coarse breath sounds, this is likely a bronchitis  Possible early pneumonia or could get worse if not responding to antibiotics  start Azithromycin Z pak (antibiotic) 2 tabs day 1, then 1 tab x 4 days, complete entire course even if improved  - Use Albuterol inhaler 2 puffs every 4-6 hours around the clock for next 2-3 days, max up to 5 days then use as needed  - Continue Allegra  Start Atrovent nasal spray decongestant 2 sprays in each nostril up to 4 times daily for 7 days  Continue Tessalon perls  Continue Mucinex  - Drink plenty of fluids to improve congestion  If your symptoms seem to worsen instead of improve over next several days, including significant fever / chills, worsening shortness of breath, worsening wheezing, or nausea / vomiting and can't take medicines - return sooner or go to hospital Emergency Department for more immediate treatment.  Please schedule a Follow-up Appointment to: Return in about 1 week (around 03/17/2017), or if symptoms worsen or fail to improve, for bronchitis.  If you have any other questions or concerns, please feel free to call the clinic or send a message through [place]. You may also schedule an earlier appointment if necessary.  Additionally, you may be receiving a survey about your experience at our clinic within a few days to 1 week by e-mail or mail. We value your feedback.  Nobie Putnam, DO [place]

## 2017-03-10 NOTE — Progress Notes (Signed)
Subjective:    Patient ID: Keith Cruz, male    DOB: 02/23/1941, 76 y.o.   MRN: [id number]  Keith Cruz is a 76 y.o. male presenting on 03/10/2017 for Cough (ear popping, mild fever 98.9 F as per pt, HA onset 3 days)  Patient presents for a same day appointment. Accompanied by wife.  HPI   URI / BRONCHITIS Reports symptoms for past 3-4 days, usually has a productive cough and recently was worse now in past 24 hours with chest congestion with difficulty coughing it up. - Tried Mucinex OTC and tried Tessalon yesterday with some improvement. Also has old inhaler Albuterol that helps some - Continues on Allegra and Flonase - Aching in neck and back and shoulders - No other sick contacts - Admits sore throat - Denies chills, sweats, nausea vomiting abdominal pain, diarrhea  Health Maintenance: - UTD Flu Shot 01/03/17  Depression screen [facility] 2/9 12/09/2016 07/01/2016 08/23/2015  Decreased Interest 0 0 0  Down, Depressed, Hopeless 0 0 0  PHQ - 2 Score 0 0 0    Social History   Tobacco Use  . Smoking status: Former Smoker    Packs/day: 2.00    Years: 10.00    Pack years: 20.00    Types: Cigarettes    Last attempt to quit: 04/28/1970    Years since quitting: 46.8  . Smokeless tobacco: Former Network engineer Use Topics  . Alcohol use: No    Alcohol/week: 0.0 oz    Comment: previous  . Drug use: No    Review of Systems Per HPI unless specifically indicated above     Objective:    BP (!) 146/61   Pulse 64   Temp 98.5 F (36.9 C) (Oral)   Resp 16   Ht 6\' 4"  (1.93 m)   Wt 224 lb (101.6 kg)   SpO2 96%   BMI 27.27 kg/m   Wt Readings from Last 3 Encounters:  03/10/17 224 lb (101.6 kg)  01/06/17 220 lb 9.6 oz (100.1 kg)  12/23/16 220 lb (99.8 kg)    Physical Exam  Constitutional: He is oriented to person, place, and time. He appears well-developed and well-nourished. No distress.  Mildly ill but still well appearing, comfortable, cooperative  HENT:  Head:  Normocephalic and atraumatic.  Mouth/Throat: Oropharynx is clear and moist.  Frontal / maxillary sinuses non-tender. Nares with some turbinate edema and congestion without purulence. Bilateral TMs clear with mild clear effusion R>L with L having some old scar tissue, no erythema. Oropharynx mild posterior pharyngeal drainage without erythema, exudates, edema or asymmetry.  Eyes: Conjunctivae are normal. Right eye exhibits no discharge. Left eye exhibits no discharge.  Neck: Normal range of motion. Neck supple. No thyromegaly present.  Cardiovascular: Normal rate, regular rhythm, normal heart sounds and intact distal pulses.  No murmur heard. Pulmonary/Chest: Effort normal and breath sounds normal. No respiratory distress. He has no wheezes. He has no rales.  Good air movement, some mild reduced air movement L>R lower base and some rhonchi that improve with cough  Musculoskeletal: He exhibits no edema.  Lymphadenopathy:    He has no cervical adenopathy.  Neurological: He is alert and oriented to person, place, and time.  Skin: Skin is warm and dry. No rash noted. He is not diaphoretic. No erythema.  Psychiatric: His behavior is normal.  Nursing note and vitals reviewed.  Results for orders placed or performed in visit on 03/10/17  POCT rapid strep A  Result Value Ref  Range   Rapid Strep A Screen Negative Negative      Assessment & Plan:   Problem List Items Addressed This Visit    None    Visit Diagnoses    Acute bronchitis, unspecified organism    -  Primary  Consistent with worsening bronchitis in setting of likely viral URI. - Afebrile but reported subjective fever, concern with some abnormal lung sounds, possible bronchitis with at risk progression for CAP. No wheezing.  Plan: 1. Reassurance, continue supportive measures - agree to start Azithromycin Z-pak dosing 500mg  then 250mg  daily x 4 days empirically - Note ALLERGY to prednisone, will avoid use  Trial on continued  Albuterol inhaler 2 puffs q 4-6 hour x 3-5 days regularly if helping 2. - Continue Allegra - Remain off Flonase, since not help - Start Atrovent nasal spray decongestant 2 sprays in each nostril up to 4 times daily for 7 days - Use existing rxTessalon Perls take 1 capsule up to 3 times a day as needed for cough - Continue mucinex Return criteria reviewed, follow-up within 1 week if not improved - next step consider CXR    Relevant Medications   azithromycin (ZITHROMAX Z-PAK) 250 MG tablet   ipratropium (ATROVENT) 0.06 % nasal spray   Sore throat     Unlikely strep, most likely viral, Rapid negative. Exam not supportive   Relevant Orders   POCT rapid strep A (Completed)      Meds ordered this encounter  Medications  . azithromycin (ZITHROMAX Z-PAK) 250 MG tablet    Sig: Take 2 tabs (500mg  total) on Day 1. Take 1 tab (250mg ) daily for next 4 days.    Dispense:  6 tablet    Refill:  0  . ipratropium (ATROVENT) 0.06 % nasal spray    Sig: Place 2 sprays 4 (four) times daily into both nostrils. For up to 5-7 days then stop.    Dispense:  15 mL    Refill:  0    Follow up plan: Return in about 1 week (around 03/17/2017), or if symptoms worsen or fail to improve, for bronchitis.  Nobie Putnam, [facility] 03/10/2017, 6:36 PM

## 2017-03-17 ENCOUNTER — Other Ambulatory Visit: Payer: Self-pay | Admitting: Family Medicine

## 2017-03-17 ENCOUNTER — Telehealth: Payer: Self-pay

## 2017-03-17 MED ORDER — ALBUTEROL SULFATE HFA 108 (90 BASE) MCG/ACT IN AERS: 1.0000 | INHALATION_SPRAY | RESPIRATORY_TRACT | 11 refills | Status: DC | PRN

## 2017-03-17 NOTE — Telephone Encounter (Signed)
Pt was seen in 11/13 for bronchitis and his cough is getting worst and having SOB antibiotics not improving his Sxs wife called to find out if patient needs to come back and seen or can get chest xray or can Rx another antibiotics? His cough is worst at night and pt had taken traZoDone which help him last night. Please suggest ?

## 2017-03-17 NOTE — Telephone Encounter (Signed)
Pt advised has appointment tomorrow.

## 2017-03-17 NOTE — Telephone Encounter (Signed)
Pt needs albuterol inhaler sent to [facility] in [facility].

## 2017-03-17 NOTE — Telephone Encounter (Signed)
I would recommend that he return to office for an office visit and chest X-ray and re-evaluate his breathing. He is allergic to prednisone, so unfortunately if he is wheezing we cannot use this treatment. We may reconsider other antibiotic.  If he cannot get scheduled within 24 hours before we close for holiday then he may need to go to Urgent Care or hospital ED if significantly worsening short of breath.  Nobie Putnam, DO [facility] 03/17/2017, 12:07 PM

## 2017-03-18 ENCOUNTER — Ambulatory Visit
Admission: RE | Admit: 2017-03-18 | Discharge: 2017-03-18 | Disposition: A | Discharge status: Home / Self Care | Payer: Medicare Other | Source: Ambulatory Visit | Attending: Family Medicine | Admitting: Family Medicine

## 2017-03-18 ENCOUNTER — Ambulatory Visit: Payer: Medicare Other

## 2017-03-18 ENCOUNTER — Ambulatory Visit (INDEPENDENT_AMBULATORY_CARE_PROVIDER_SITE_OTHER): Payer: Medicare Other | Admitting: Family Medicine

## 2017-03-18 ENCOUNTER — Encounter: Payer: Self-pay | Admitting: Family Medicine

## 2017-03-18 VITALS — BP 159/74 | HR 72 | Temp 97.6°F | Resp 16 | Ht 76.0 in | Wt 222.6 lb

## 2017-03-18 DIAGNOSIS — J440 Chronic obstructive pulmonary disease with acute lower respiratory infection: Secondary | ICD-10-CM | POA: Diagnosis not present

## 2017-03-18 DIAGNOSIS — J209 Acute bronchitis, unspecified: Secondary | ICD-10-CM

## 2017-03-18 DIAGNOSIS — J410 Simple chronic bronchitis: Secondary | ICD-10-CM

## 2017-03-18 DIAGNOSIS — I700 Atherosclerosis of aorta: Secondary | ICD-10-CM | POA: Diagnosis not present

## 2017-03-18 MED ORDER — LEVOFLOXACIN 500 MG PO TABS: 500.0000 mg | ORAL_TABLET | Freq: Every day | ORAL | 0 refills | Status: DC

## 2017-03-18 NOTE — Progress Notes (Signed)
Subjective:    Patient ID: Keith Cruz, male    DOB: [birth year], 76 y.o.   MRN: [id number]  Keith Cruz is a 76 y.o. male presenting on 03/18/2017 for Bronchitis  Accompanied by his wife, Keith Cruz.  HPI   FOLLOW-UP BRONCHITIS vs COPD EXACERBATION - Last visit with me 03/10/17, for initial visit for same problem, treated with Azithromcyin Z-pak and Atrovent nasal spray for bronchitis / rhinitis vs URI, see prior notes for background information. - Interval update with some improvement overall on Azithromycin but then recent worsening again with worsening cough productive, never fully resolved. They contacted [place] 11/20 about repeat antibiotics. Asked to come into office. - Today patient reports now worse over 1-2 nights with productive thicker sputum, describes as stringy yellow and greyish, demonstrate some mucus today in office. He actually "feels" better overall but cough is worse, and some episodes of dyspnea. - Usually levaquin helps resolve his symptoms in past, requesting to try a round of this - Using Albuterol and neb PRN but admits it makes him "jittery" and states albuterol nebulizer has "small amount of prednisone in it" thinks this causes his symptoms. He cannot tolerate prednisone due to allergy. - Denies fevers/chills, sweats, chest pain or pressure, sore throat, ear pain or sinus pain or pressure, nausea vomiting  Health Maintenance: - UTD Flu Shot 01/03/17  Depression screen [facility] 2/9 12/09/2016 07/01/2016 08/23/2015  Decreased Interest 0 0 0  Down, Depressed, Hopeless 0 0 0  PHQ - 2 Score 0 0 0    Social History   Tobacco Use  . Smoking status: Former Smoker    Packs/day: 2.00    Years: 10.00    Pack years: 20.00    Types: Cigarettes    Last attempt to quit: 04/28/1970    Years since quitting: 46.9  . Smokeless tobacco: Former Network engineer Use Topics  . Alcohol use: No    Alcohol/week: 0.0 oz    Comment: previous  . Drug use: No    Review of Systems Per HPI  unless specifically indicated above     Objective:    BP (!) 159/74   Pulse 72   Temp 97.6 F (36.4 C) (Oral)   Resp 16   Ht 6\' 4"  (1.93 m)   Wt 222 lb 9.6 oz (101 kg)   SpO2 99%   BMI 27.10 kg/m   Wt Readings from Last 3 Encounters:  03/18/17 222 lb 9.6 oz (101 kg)  03/10/17 224 lb (101.6 kg)  01/06/17 220 lb 9.6 oz (100.1 kg)    Physical Exam  Constitutional: He is oriented to person, place, and time. He appears well-developed and well-nourished. No distress.  Seems slightly improved and still mostly well appearing, comfortable, cooperative  HENT:  Head: Normocephalic and atraumatic.  Mouth/Throat: Oropharynx is clear and moist.  Frontal / maxillary sinuses non-tender. Nares with some turbinate edema. Oropharynx mild posterior pharyngeal drainage without erythema, exudates, edema or asymmetry.  Eyes: Conjunctivae are normal. Right eye exhibits no discharge. Left eye exhibits no discharge.  Neck: Normal range of motion. Neck supple.  Cardiovascular: Normal rate, regular rhythm, normal heart sounds and intact distal pulses.  No murmur heard. Pulmonary/Chest: Effort normal and breath sounds normal. No respiratory distress. He has no wheezes. He has no rales.  Good air movement still with some mostly unchanged mild reduced air movement lower base. Again rhonchi clear with cough. No overt wheezing or focal abnormality. Increased coughing spells.  Musculoskeletal: He exhibits no  edema.  Lymphadenopathy:    He has no cervical adenopathy.  Neurological: He is alert and oriented to person, place, and time.  Skin: Skin is warm and dry. No rash noted. He is not diaphoretic. No erythema.  Psychiatric: His behavior is normal.  Nursing note and vitals reviewed.  I have personally reviewed the radiology report from 03/18/17 Chest X-ray.  CLINICAL DATA:  Worsening bronchitis  EXAM: CHEST  2 VIEW  COMPARISON:  09/15/2016  FINDINGS: Heart is top-normal in size. There is minimal  aortic atherosclerosis at the arch. The patient is status post median sternotomy. Lungs are clear without active pulmonary disease. No effusion or pneumothorax. No acute osseous appearing abnormality.  IMPRESSION: 1. No active cardiopulmonary disease.  Aortic atherosclerosis. 2. Stable appearance of the chest.   Electronically Signed   By: Ashley Royalty M.D.   On: 03/18/2017 14:39  Results for orders placed or performed in visit on 03/10/17  POCT rapid strep A  Result Value Ref Range   Rapid Strep A Screen Negative Negative      Assessment & Plan:   Problem List Items Addressed This Visit    Chronic bronchitis ([place])    Other Visit Diagnoses    Acute bronchitis with COPD ([place])    -  Primary  Consistent with mild-moderate acute exacerbation of COPD with worsening productive cough after prior URI vs bronchitis, unresolved with z-pak. Similar to prior exacerbations. - No hypoxia (99% on RA), afebrile, no recent hospitalization - Continues Albuterol, Flovent - limited use of albuterol due to side effect - Unable to rx prednisone d/t allergy  Plan: 1. Check CXR - reviewed result, no active infiltrate or other cardiopulm problem, called patient 2. Start Levaquin 500mg  daily x 7 days 3. Use albuterol q 4 hr regularly x 2-3 days. Continue maintenance inhalers 4. RTC about 1-2 week if not improving, otherwise strict return criteria to go to ED    Relevant Medications   FLOVENT HFA 110 MCG/ACT inhaler   levofloxacin (LEVAQUIN) 500 MG tablet   Other Relevant Orders   DG Chest 2 View (Completed)      Meds ordered this encounter  Medications  . levofloxacin (LEVAQUIN) 500 MG tablet    Sig: Take 1 tablet (500 mg total) by mouth daily. For 7 days    Dispense:  7 tablet    Refill:  0   Follow up plan: Return in about 2 weeks (around 04/01/2017), or if symptoms worsen or fail to improve, for Bronchitis COPD.  Nobie Putnam, DO [facility] 03/18/2017, 8:47 PM

## 2017-03-18 NOTE — Patient Instructions (Addendum)
Thank you for coming to the clinic today.   1. It sounds like you had an Upper Respiratory Virus that has settled into a Bronchitis, lower respiratory tract infection. I don't have concerns for pneumonia today, and think that this should gradually improve. Once you are feeling better, the cough may take a few weeks to fully resolve.  Likely COPD  Start Levaquin 500mg  daily x 7 days  Continue albuterol or nebulizer as needed. May take Tessalon perls  X-ray ordered.  - Use nasal saline (Simply Saline or Ocean Spray) to flush nasal congestion multiple times a day, may help cough - Drink plenty of fluids to improve congestion  If your symptoms seem to worsen instead of improve over next several days, including significant fever / chills, worsening shortness of breath, worsening wheezing, or nausea / vomiting and can't take medicines - return sooner or go to hospital Emergency Department for more immediate treatment.  Please schedule a Follow-up Appointment to: Return in about 2 weeks (around 04/01/2017), or if symptoms worsen or fail to improve, for Bronchitis COPD.  If you have any other questions or concerns, please feel free to call the clinic or send a message through [place]. You may also schedule an earlier appointment if necessary.  Additionally, you may be receiving a survey about your experience at our clinic within a few days to 1 week by e-mail or mail. We value your feedback.  Nobie Putnam, DO [place]

## 2017-04-10 ENCOUNTER — Other Ambulatory Visit: Payer: Self-pay | Admitting: Family Medicine

## 2017-04-10 DIAGNOSIS — J209 Acute bronchitis, unspecified: Secondary | ICD-10-CM

## 2017-04-20 ENCOUNTER — Other Ambulatory Visit: Payer: Self-pay | Admitting: Family Medicine

## 2017-04-20 DIAGNOSIS — J209 Acute bronchitis, unspecified: Secondary | ICD-10-CM

## 2017-04-27 ENCOUNTER — Other Ambulatory Visit: Payer: Self-pay | Admitting: Family Medicine

## 2017-04-27 DIAGNOSIS — J209 Acute bronchitis, unspecified: Secondary | ICD-10-CM

## 2017-05-29 DIAGNOSIS — J40 Bronchitis, not specified as acute or chronic: Secondary | ICD-10-CM

## 2017-05-29 HISTORY — DX: Bronchitis, not specified as acute or chronic: J40

## 2017-06-11 ENCOUNTER — Encounter: Payer: Self-pay | Admitting: Family Medicine

## 2017-06-11 ENCOUNTER — Ambulatory Visit (INDEPENDENT_AMBULATORY_CARE_PROVIDER_SITE_OTHER): Payer: Medicare Other | Admitting: Family Medicine

## 2017-06-11 DIAGNOSIS — J440 Chronic obstructive pulmonary disease with acute lower respiratory infection: Secondary | ICD-10-CM

## 2017-06-11 DIAGNOSIS — J209 Acute bronchitis, unspecified: Secondary | ICD-10-CM | POA: Diagnosis not present

## 2017-06-11 MED ORDER — ALBUTEROL SULFATE HFA 108 (90 BASE) MCG/ACT IN AERS: 1.0000 | INHALATION_SPRAY | RESPIRATORY_TRACT | 11 refills | Status: DC | PRN

## 2017-06-11 MED ORDER — LEVOFLOXACIN 500 MG PO TABS: 500.0000 mg | ORAL_TABLET | Freq: Every day | ORAL | 0 refills | Status: DC

## 2017-06-11 NOTE — Patient Instructions (Addendum)
Thank you for coming to the office today.  1.  This may be viral infection it could run course in 7-10 days  If not improving may have settled into lower respiratory bronchitis vs COPD flare up  Continue breathing treatments - may add Albuterol now every 4-6 hours to help clear congestion  Continue mucinex for now to clear congestion - may take breaks on this do not use continuously as it can make you cough  Caution with Afrin using too much for sinus swelling rebound effect  Rx Levaquin only if not improved in 24-48 hours  For pain can use Tylenol, very rarely aleve  Recommend to start taking Tylenol Extra Strength 500mg  tabs - take 1 to 2 tabs per dose (max 1000mg ) every 6-8 hours for pain (take regularly, don't skip a dose for next 7 days), max 24 hour daily dose is 6 tablets or 3000mg . In the future you can repeat the same everyday Tylenol course for 1-2 weeks at a time.   Can use OTC Aleve 250mg  1-2 per dose up to twice daily with food for 3-5 days max, do not take ibuprofen  Caution kidney function and heart on ibuprofen  Please schedule a Follow-up Appointment to: Return in about 1 week (around 06/18/2017), or if symptoms worsen or fail to improve, for COPD bronchitis.    If you have any other questions or concerns, please feel free to call the office or send a message through [place]. You may also schedule an earlier appointment if necessary.  Additionally, you may be receiving a survey about your experience at our office within a few days to 1 week by e-mail or mail. We value your feedback.  Nobie Putnam, DO [place]

## 2017-06-11 NOTE — Progress Notes (Signed)
Subjective:    Patient ID: Keith Cruz, male    DOB: 20-Mar-1941, 77 y.o.   MRN: [id number]  Keith Cruz is a 77 y.o. male presenting on 06/11/2017 for Cough (bodyache, sore throat, chills, no fever onset 3 days mainly coughing)  Patient presents for a same day appointment. Accompanied by wife, [name].  HPI   BRONC/HITIS / COPD Exacerbation Last COPD Flare 02/2017, resolved with Levaquin, failed azithromycin in past, he has allergy to prednisone Has history of chronic cough in past with recurrent bronchitis, now worsening for past 2-3 days with more productive sputum with thicker yellow and some red streaks with blood tinge - Taking Mucinex OTC twice day regularly most days - Taking generic Afrin most days - Has not used albuterol but still using Flovent twice daily - Admits some slightly improved body aches now in back and shoulders - Admits frontal headache sinus pressure and ear pressure ringing - Denies any shortness of breath or wheezing, fever, nausea vomiting abdominal pain  Health Maintenance: UTD Flu Shot  Depression screen [facility] 2/9 12/09/2016 07/01/2016 08/23/2015  Decreased Interest 0 0 0  Down, Depressed, Hopeless 0 0 0  PHQ - 2 Score 0 0 0    Social History   Tobacco Use  . Smoking status: Former Smoker    Packs/day: 2.00    Years: 10.00    Pack years: 20.00    Types: Cigarettes    Last attempt to quit: 04/28/1970    Years since quitting: 47.1  . Smokeless tobacco: Former Network engineer Use Topics  . Alcohol use: No    Alcohol/week: 0.0 oz    Comment: previous  . Drug use: No    Review of Systems Per HPI unless specifically indicated above     Objective:    BP 135/60   Pulse 66   Temp 98.3 F (36.8 C) (Oral)   Resp 16   Ht 6\' 2"  (1.88 m)   Wt 218 lb (98.9 kg)   SpO2 97%   BMI 27.99 kg/m   Wt Readings from Last 3 Encounters:  06/11/17 218 lb (98.9 kg)  03/18/17 222 lb 9.6 oz (101 kg)  03/10/17 224 lb (101.6 kg)    Physical Exam    Constitutional: He is oriented to person, place, and time. He appears well-developed and well-nourished. No distress.  Mildly ill and tired appearing, comfortable, cooperative  HENT:  Head: Normocephalic and atraumatic.  Mouth/Throat: Oropharynx is clear and moist.  Frontal / maxillary sinuses non-tender. Nares with some turbinate edema. Oropharynx clear w/o drainage without erythema, exudates, edema or asymmetry.  Eyes: Conjunctivae are normal. Right eye exhibits no discharge. Left eye exhibits no discharge.  Neck: Normal range of motion. Neck supple.  Cardiovascular: Normal rate, regular rhythm, normal heart sounds and intact distal pulses.  No murmur heard. Pulmonary/Chest: Effort normal. No respiratory distress. He has wheezes. He has no rales.  Diffuse bilateral lower resp end exp wheezing and some coarse sounds, no focal crackles. Occasional coughing spells productive.  Musculoskeletal: He exhibits no edema.  Lymphadenopathy:    He has no cervical adenopathy.  Neurological: He is alert and oriented to person, place, and time.  Skin: Skin is warm and dry. No rash noted. He is not diaphoretic. No erythema.  Psychiatric: His behavior is normal.  Nursing note and vitals reviewed.      Assessment & Plan:   Problem List Items Addressed This Visit    None    Visit Diagnoses  Acute bronchitis with COPD ([place])       Relevant Medications   levofloxacin (LEVAQUIN) 500 MG tablet   albuterol (PROVENTIL HFA;VENTOLIN HFA) 108 (90 Base) MCG/ACT inhaler      Consistent with mild-moderate acute exacerbation of COPD with worsening productive cough Possible URI trigger, but now with wheezing worsening, similar to prior exacerbations. - No hypoxia (97% on RA), afebrile, no recent hospitalization - Continues Albuterol, Flovent - limited use of albuterol due to side effect - Unable to rx prednisone d/t allergy  Plan: 1. Defer CXR - afebrile, duration not consistent with PNA - some minor  blood tinged sputum without significant hemoptysis, do not think indicated at this time - START Albuterol 2-4 times a day PRN to open lungs and help wheezing/breathing - Continue Mucinex BID for 7-10 days, caution prolong use of mucinex trigger cough - Inc hydration clear fluids - Caution with Afrin avoid rebound - If not improved 24-48 hours then may cover for more COPD / bronchitis with repeat Levaquin course 500 mg x 7 days, give upcoming weekend, caution on frequent antibiotics  RTC about 1-2 week if not improving, otherwise strict return criteria to go to ED  Meds ordered this encounter  Medications  . levofloxacin (LEVAQUIN) 500 MG tablet    Sig: Take 1 tablet (500 mg total) by mouth daily. For 7 days    Dispense:  7 tablet    Refill:  0  . albuterol (PROVENTIL HFA;VENTOLIN HFA) 108 (90 Base) MCG/ACT inhaler    Sig: Inhale 1-2 puffs into the lungs every 4 (four) hours as needed for wheezing or shortness of breath.    Dispense:  1 Inhaler    Refill:  11      Follow up plan: Return in about 1 week (around 06/18/2017), or if symptoms worsen or fail to improve, for COPD bronchitis.  Nobie Putnam, [facility] 06/11/2017, 10:46 AM

## 2017-06-22 NOTE — Discharge Instructions (Signed)

## 2017-06-23 NOTE — Anesthesia Preprocedure Evaluation (Addendum)
Anesthesia Evaluation  Patient identified by MRN, date of birth, ID band Patient awake    Reviewed: Allergy & Precautions, NPO status , Patient's Chart, lab work & pertinent test results  History of Anesthesia Complications Negative for: history of anesthetic complications  Airway Mallampati: III  TM Distance: >3 FB Neck ROM: Full    Dental  (+) Upper Dentures, Partial Lower   Pulmonary asthma , former smoker (quit 1972),    Pulmonary exam normal breath sounds clear to auscultation       Cardiovascular Exercise Tolerance: Good hypertension, + CAD (s/p CABG 5V 2015) and + Peripheral Vascular Disease (s/p CEA 2016, renal stent 2010)  Normal cardiovascular exam+ dysrhythmias (a fib)  Rhythm:Regular Rate:Normal     Neuro/Psych Seizures -,  PSYCHIATRIC DISORDERS Anxiety Depression TIA (1991; no deficits)   GI/Hepatic GERD  ,  Endo/Other  Pre-diabetes  Renal/GU Renal disease (stage III CKD)     Musculoskeletal   Abdominal   Peds  Hematology negative hematology ROS (+)   Anesthesia Other Findings   Reproductive/Obstetrics                            Anesthesia Physical Anesthesia Plan  ASA: III  Anesthesia Plan: MAC   Post-op Pain Management:    Induction: Intravenous  PONV Risk Score and Plan: 1 and TIVA and Midazolam  Airway Management Planned: Natural Airway  Additional Equipment:   Intra-op Plan:   Post-operative Plan:   Informed Consent: I have reviewed the patients History and Physical, chart, labs and discussed the procedure including the risks, benefits and alternatives for the proposed anesthesia with the patient or authorized representative who has indicated his/her understanding and acceptance.     Plan Discussed with: CRNA  Anesthesia Plan Comments:        Anesthesia Quick Evaluation

## 2017-06-24 ENCOUNTER — Ambulatory Visit
Admission: RE | Admit: 2017-06-24 | Discharge: 2017-06-24 | Disposition: A | Discharge status: Home / Self Care | Payer: Medicare Other | Source: Ambulatory Visit | Attending: Ophthalmology | Admitting: Ophthalmology

## 2017-06-24 ENCOUNTER — Ambulatory Visit: Payer: Medicare Other | Admitting: Anesthesiology

## 2017-06-24 ENCOUNTER — Encounter: Payer: Self-pay | Admitting: Ophthalmology

## 2017-06-24 ENCOUNTER — Encounter
Admission: RE | Disposition: A | Discharge status: Home / Self Care | Payer: Self-pay | Source: Ambulatory Visit | Attending: Ophthalmology

## 2017-06-24 DIAGNOSIS — J45909 Unspecified asthma, uncomplicated: Secondary | ICD-10-CM | POA: Insufficient documentation

## 2017-06-24 DIAGNOSIS — Z8673 Personal history of transient ischemic attack (TIA), and cerebral infarction without residual deficits: Secondary | ICD-10-CM | POA: Insufficient documentation

## 2017-06-24 DIAGNOSIS — Z7902 Long term (current) use of antithrombotics/antiplatelets: Secondary | ICD-10-CM | POA: Diagnosis not present

## 2017-06-24 DIAGNOSIS — I2510 Atherosclerotic heart disease of native coronary artery without angina pectoris: Secondary | ICD-10-CM | POA: Insufficient documentation

## 2017-06-24 DIAGNOSIS — I129 Hypertensive chronic kidney disease with stage 1 through stage 4 chronic kidney disease, or unspecified chronic kidney disease: Secondary | ICD-10-CM | POA: Diagnosis not present

## 2017-06-24 DIAGNOSIS — I739 Peripheral vascular disease, unspecified: Secondary | ICD-10-CM | POA: Insufficient documentation

## 2017-06-24 DIAGNOSIS — Z87891 Personal history of nicotine dependence: Secondary | ICD-10-CM | POA: Insufficient documentation

## 2017-06-24 DIAGNOSIS — I700 Atherosclerosis of aorta: Secondary | ICD-10-CM | POA: Insufficient documentation

## 2017-06-24 DIAGNOSIS — Z951 Presence of aortocoronary bypass graft: Secondary | ICD-10-CM | POA: Diagnosis not present

## 2017-06-24 DIAGNOSIS — E7800 Pure hypercholesterolemia, unspecified: Secondary | ICD-10-CM | POA: Diagnosis not present

## 2017-06-24 DIAGNOSIS — Z79899 Other long term (current) drug therapy: Secondary | ICD-10-CM | POA: Insufficient documentation

## 2017-06-24 DIAGNOSIS — Z960 Presence of urogenital implants: Secondary | ICD-10-CM | POA: Diagnosis not present

## 2017-06-24 DIAGNOSIS — H2512 Age-related nuclear cataract, left eye: Secondary | ICD-10-CM | POA: Diagnosis present

## 2017-06-24 DIAGNOSIS — K219 Gastro-esophageal reflux disease without esophagitis: Secondary | ICD-10-CM | POA: Diagnosis not present

## 2017-06-24 DIAGNOSIS — I252 Old myocardial infarction: Secondary | ICD-10-CM | POA: Insufficient documentation

## 2017-06-24 DIAGNOSIS — N183 Chronic kidney disease, stage 3 (moderate): Secondary | ICD-10-CM | POA: Diagnosis not present

## 2017-06-24 HISTORY — DX: Unspecified convulsions: R56.9

## 2017-06-24 HISTORY — DX: Occlusion and stenosis of bilateral carotid arteries: I65.23

## 2017-06-24 HISTORY — DX: Viral meningitis, unspecified: A87.9

## 2017-06-24 HISTORY — DX: Chronic cough: R05.3

## 2017-06-24 HISTORY — DX: Gastro-esophageal reflux disease without esophagitis: K21.9

## 2017-06-24 HISTORY — DX: Cough: R05

## 2017-06-24 HISTORY — DX: Atherosclerosis of aorta: I70.0

## 2017-06-24 HISTORY — DX: Cardiac arrhythmia, unspecified: I49.9

## 2017-06-24 HISTORY — PX: CATARACT EXTRACTION W/PHACO: SHX586

## 2017-06-24 HISTORY — DX: Transient cerebral ischemic attack, unspecified: G45.9

## 2017-06-24 SURGERY — PHACOEMULSIFICATION, CATARACT, WITH IOL INSERTION
Anesthesia: Monitor Anesthesia Care | Site: Eye | Laterality: Left | Wound class: Clean

## 2017-06-24 MED ORDER — LACTATED RINGERS IV SOLN: INTRAVENOUS | Status: DC

## 2017-06-24 MED ORDER — EPINEPHRINE PF 1 MG/ML IJ SOLN
INTRAOCULAR | Status: DC | PRN
  Administered 2017-06-24: 45 mL via OPHTHALMIC

## 2017-06-24 MED ORDER — NA HYALUR & NA CHOND-NA HYALUR 0.4-0.35 ML IO KIT
PACK | INTRAOCULAR | Status: DC | PRN
  Administered 2017-06-24: 1 mL via INTRAOCULAR

## 2017-06-24 MED ORDER — MIDAZOLAM HCL 2 MG/2ML IJ SOLN
INTRAMUSCULAR | Status: DC | PRN
  Administered 2017-06-24: 2 mg via INTRAVENOUS

## 2017-06-24 MED ORDER — ARMC OPHTHALMIC DILATING DROPS
1.0000 "application " | OPHTHALMIC | Status: DC | PRN
  Administered 2017-06-24 (×3): 1 via OPHTHALMIC

## 2017-06-24 MED ORDER — CEFUROXIME OPHTHALMIC INJECTION 1 MG/0.1 ML
INJECTION | OPHTHALMIC | Status: DC | PRN
Start: 2017-06-24 — End: 2017-06-24
  Administered 2017-06-24: .3 mL via INTRACAMERAL

## 2017-06-24 MED ORDER — LIDOCAINE HCL (PF) 2 % IJ SOLN
INTRAOCULAR | Status: DC | PRN
  Administered 2017-06-24: 1 mL via INTRAMUSCULAR

## 2017-06-24 MED ORDER — ACETAMINOPHEN 325 MG PO TABS: 650.0000 mg | ORAL_TABLET | Freq: Once | ORAL | Status: DC | PRN

## 2017-06-24 MED ORDER — ONDANSETRON HCL 4 MG/2ML IJ SOLN
4.0000 mg | Freq: Once | INTRAMUSCULAR | Status: DC | PRN
Start: 2017-06-24 — End: 2017-06-24

## 2017-06-24 MED ORDER — BRIMONIDINE TARTRATE-TIMOLOL 0.2-0.5 % OP SOLN
OPHTHALMIC | Status: DC | PRN
  Administered 2017-06-24: 1 [drp] via OPHTHALMIC

## 2017-06-24 MED ORDER — ACETAMINOPHEN 160 MG/5ML PO SOLN: 325.0000 mg | ORAL | Status: DC | PRN

## 2017-06-24 MED ORDER — MOXIFLOXACIN HCL 0.5 % OP SOLN
1.0000 [drp] | OPHTHALMIC | Status: DC | PRN
  Administered 2017-06-24 (×3): 1 [drp] via OPHTHALMIC

## 2017-06-24 MED ORDER — FENTANYL CITRATE (PF) 100 MCG/2ML IJ SOLN
INTRAMUSCULAR | Status: DC | PRN
  Administered 2017-06-24: 50 ug via INTRAVENOUS

## 2017-06-24 SURGICAL SUPPLY — 25 items

## 2017-06-24 NOTE — Transfer of Care (Signed)
Immediate Anesthesia Transfer of Care Note  Patient: Keith Cruz  Procedure(s) Performed: CATARACT EXTRACTION PHACO AND INTRAOCULAR LENS PLACEMENT (IOC) LEFT BORDERLINE DIABETIC (Left Eye)  Patient Location: PACU  Anesthesia Type: MAC  Level of Consciousness: awake, alert  and patient cooperative  Airway and Oxygen Therapy: Patient Spontanous Breathing and Patient connected to supplemental oxygen  Post-op Assessment: Post-op Vital signs reviewed, Patient's Cardiovascular Status Stable, Respiratory Function Stable, Patent Airway and No signs of Nausea or vomiting  Post-op Vital Signs: Reviewed and stable  Complications: No apparent anesthesia complications

## 2017-06-24 NOTE — Op Note (Signed)
OPERATIVE NOTE  VON QUINTANAR [id number] 06/24/2017   PREOPERATIVE DIAGNOSIS:  Nuclear sclerotic cataract left eye. H25.12   POSTOPERATIVE DIAGNOSIS:    Nuclear sclerotic cataract left eye.     PROCEDURE:  Phacoemusification with posterior chamber intraocular lens placement of the left eye   LENS:   Implant Name Type Inv. Item Serial No. Manufacturer Lot No. LRB No. Used  LENS IOL DIOP 22.5 - [id number] Intraocular Lens LENS IOL DIOP 22.5 [id number]  Left 1        ULTRASOUND TIME: 16  % of 0 minutes 50 seconds, CDE 7.8  SURGEON:  Wyonia Hough, MD   ANESTHESIA:  Topical with tetracaine drops and 2% Xylocaine jelly, augmented with 1% preservative-free intracameral lidocaine.    COMPLICATIONS:  None.   DESCRIPTION OF PROCEDURE:  The patient was identified in the holding room and transported to the operating room and placed in the supine position under the operating microscope.  The left eye was identified as the operative eye and it was prepped and draped in the usual sterile ophthalmic fashion.   A 1 millimeter clear-corneal paracentesis was made at the 1:30 position.  0.5 ml of preservative-free 1% lidocaine was injected into the anterior chamber.  The anterior chamber was filled with Viscoat viscoelastic.  A 2.4 millimeter keratome was used to make a near-clear corneal incision at the 10:30 position.  .  A curvilinear capsulorrhexis was made with a cystotome and capsulorrhexis forceps.  Balanced salt solution was used to hydrodissect and hydrodelineate the nucleus.   Phacoemulsification was then used in stop and chop fashion to remove the lens nucleus and epinucleus.  The remaining cortex was then removed using the irrigation and aspiration handpiece. Provisc was then placed into the capsular bag to distend it for lens placement.  A lens was then injected into the capsular bag.  The remaining viscoelastic was aspirated.   Wounds were hydrated with balanced salt  solution.  The anterior chamber was inflated to a physiologic pressure with balanced salt solution.  No wound leaks were noted. Cefuroxime 0.1 ml of a 10mg /ml solution was injected into the anterior chamber for a dose of 1 mg of intracameral antibiotic at the completion of the case.   Timolol and Brimonidine drops were applied to the eye.  The patient was taken to the recovery room in stable condition without complications of anesthesia or surgery.  Meriem Lemieux 06/24/2017, 11:58 AM

## 2017-06-24 NOTE — Anesthesia Procedure Notes (Signed)
Procedure Name: MAC Date/Time: 06/24/2017 11:44 AM Performed by: Janna Arch, CRNA Pre-anesthesia Checklist: Patient identified, Emergency Drugs available, Suction available and Patient being monitored Patient Re-evaluated:Patient Re-evaluated prior to induction Oxygen Delivery Method: Nasal cannula

## 2017-06-24 NOTE — Anesthesia Postprocedure Evaluation (Signed)
Anesthesia Post Note  Patient: Keith Cruz  Procedure(s) Performed: CATARACT EXTRACTION PHACO AND INTRAOCULAR LENS PLACEMENT ([facility]) LEFT BORDERLINE DIABETIC (Left Eye)  Patient location during evaluation: PACU Anesthesia Type: MAC Level of consciousness: awake and alert, oriented and patient cooperative Pain management: pain level controlled Vital Signs Assessment: post-procedure vital signs reviewed and stable Respiratory status: spontaneous breathing, nonlabored ventilation and respiratory function stable Cardiovascular status: blood pressure returned to baseline and stable Postop Assessment: adequate PO intake Anesthetic complications: no    Darrin Nipper

## 2017-06-24 NOTE — H&P (Signed)
The History and Physical notes are on paper, have been signed, and are to be scanned. The patient remains stable and unchanged from the H&P.   Previous H&P reviewed, patient examined, and there are no changes.  Keith Cruz 06/24/2017 10:30 AM

## 2017-06-26 ENCOUNTER — Encounter (INDEPENDENT_AMBULATORY_CARE_PROVIDER_SITE_OTHER): Payer: Self-pay | Admitting: Vascular Surgery

## 2017-06-26 ENCOUNTER — Ambulatory Visit (INDEPENDENT_AMBULATORY_CARE_PROVIDER_SITE_OTHER): Payer: Medicare Other | Admitting: Vascular Surgery

## 2017-06-26 ENCOUNTER — Ambulatory Visit (INDEPENDENT_AMBULATORY_CARE_PROVIDER_SITE_OTHER): Payer: Medicare Other

## 2017-06-26 VITALS — BP 153/79 | HR 58 | Resp 15 | Ht 76.0 in | Wt 217.0 lb

## 2017-06-26 DIAGNOSIS — I10 Essential (primary) hypertension: Secondary | ICD-10-CM

## 2017-06-26 DIAGNOSIS — N183 Chronic kidney disease, stage 3 unspecified: Secondary | ICD-10-CM

## 2017-06-26 DIAGNOSIS — I701 Atherosclerosis of renal artery: Secondary | ICD-10-CM

## 2017-06-26 DIAGNOSIS — I6523 Occlusion and stenosis of bilateral carotid arteries: Secondary | ICD-10-CM | POA: Diagnosis not present

## 2017-06-26 NOTE — Assessment & Plan Note (Signed)
He has 40-59% recurrent stenosis in both carotid arteries after previous endarterectomy by duplex.  This is stable and has not progressed over time.  Continue antiplatelet and statin therapy.  Recheck in 1 year.

## 2017-06-26 NOTE — Assessment & Plan Note (Signed)
His renal artery duplex today shows no hemodynamically significant stenosis in the right renal artery stent although it was not particularly well seen.  Left renal artery has velocities consistent with a greater than 60% stenosis which have been present for some time. No changes for now.  No plan for intervention. Recheck in one year.

## 2017-06-26 NOTE — Assessment & Plan Note (Signed)
Stable

## 2017-06-26 NOTE — Progress Notes (Signed)
MRN : [id number]  Keith Cruz is a 77 y.o. (1940-12-02) male who presents with chief complaint of  Chief Complaint  Patient presents with  . Follow-up    1 year renal/Carotid  .  History of Present Illness: Patient returns in follow-up of multiple vascular issues.  He is doing well.  He is scheduled to see his nephrologist later this month but as far as he knows his renal function and blood pressure have been stable.  His renal artery duplex today shows no hemodynamically significant stenosis in the right renal artery stent although it was not particularly well seen.  Left renal artery has velocities consistent with a greater than 60% stenosis which have been present for some time. He is also studied with carotid duplex today.  He has 40-59% recurrent stenosis in both carotid arteries after previous endarterectomy by duplex.  He denies focal neurologic symptoms. Specifically, the patient denies amaurosis fugax, speech or swallowing difficulties, or arm or leg weakness or numbness   Current Outpatient Medications  Medication Sig Dispense Refill  . albuterol (PROVENTIL HFA;VENTOLIN HFA) 108 (90 Base) MCG/ACT inhaler Inhale 1-2 puffs into the lungs every 4 (four) hours as needed for wheezing or shortness of breath. 1 Inhaler 11  . Cholecalciferol (VITAMIN D3) 2000 UNITS capsule Take 2,000 Units by mouth daily.    . Clobetasol Propionate 0.05 % lotion Apply topically.    . clopidogrel (PLAVIX) 75 MG tablet Take 1 tablet (75 mg total) by mouth daily. 90 tablet 3  . fexofenadine (ALLEGRA) 180 MG tablet TAKE 1 TABLET BY MOUTH DAILY 90 tablet 3  . FLOVENT HFA 110 MCG/ACT inhaler     . folic acid (FOLVITE) 1 MG tablet Take 0.8 mg by mouth daily.    Marland Kitchen glucose blood (ACCU-CHEK AVIVA PLUS) test strip Check blood sugar 1 x daily 100 each 5  . metoprolol tartrate (LOPRESSOR) 50 MG tablet Take 1 tablet (50 mg total) by mouth 2 (two) times daily. 180 tablet 3  . Multiple Vitamins-Minerals (CENTRUM  SILVER 50+MEN PO) Take by mouth.    . pantoprazole (PROTONIX) 40 MG tablet Take 1 tablet (40 mg total) by mouth 2 (two) times daily. 180 tablet 3  . simvastatin (ZOCOR) 40 MG tablet Take 1 tablet (40 mg total) by mouth daily. 90 tablet 3  . levofloxacin (LEVAQUIN) 500 MG tablet Take 1 tablet (500 mg total) by mouth daily. For 7 days (Patient not taking: Reported on 06/24/2017) 7 tablet 0  . traZODone (DESYREL) 50 MG tablet Take by mouth.     No current facility-administered medications for this visit.     Past Medical History:  Diagnosis Date  . Atherosclerosis of aorta ([place])   . Carotid stenosis, bilateral   . Chronic cough   . Coronary artery disease   . Dysrhythmia   . GERD (gastroesophageal reflux disease)   . Hypertension   . Kidney disease   . Myocardial infarction ([place])   . Seasonal allergies   . Seizures ([facility])    viral meningitis  . TIA (transient ischemic attack)   . Viral meningitis 2017    Past Surgical History:  Procedure Laterality Date  . CAROTID ENDARTERECTOMY Left 2014  . CAROTID ENDARTERECTOMY Right 1991  . CATARACT EXTRACTION W/PHACO Left 06/24/2017   Procedure: CATARACT EXTRACTION PHACO AND INTRAOCULAR LENS PLACEMENT ([place]) LEFT BORDERLINE DIABETIC;  Surgeon: Leandrew Koyanagi, MD;  Location: [facility];  Service: Ophthalmology;  Laterality: Left;  . CORONARY ARTERY BYPASS GRAFT    .  URETERAL STENT PLACEMENT     Social History       Social History  Substance Use Topics  . Smoking status: Former Smoker    Packs/day: 2.00    Years: 10.00    Types: Cigarettes  . Smokeless tobacco: Former Systems developer  . Alcohol use No     Comment: previous    Family History      Family History  Problem Relation Age of Onset  . Stroke Mother   . Heart attack Mother   . Stroke Father   . Stroke Paternal Grandmother   . Stroke Paternal Grandfather          Allergies  Allergen Reactions  . Fexofenadine-Pseudoephed Er Swelling      Tongue and face. Only with D product. Tolerates plain allegra at home.  Tongue and face. Only with D product. Tolerates plain allegra at home.   . Nifedipine Swelling    Tongue and face Tongue and face Tongue and face  . Allegra [Fexofenadine] Swelling    Tongue and face. Only with D product. Tolerates plain allegra at home.   . Prednisone     Other reaction(s): Other (See Comments) Mental status changes Other reaction(s): Other (See Comments) Mental status changes     REVIEW OF SYSTEMS(Negative unless checked)  Constitutional: [] Weight loss[] Fever[] Chills Cardiac:[] Chest pain[] Chest pressure[x] Palpitations [] Shortness of breath when laying flat [] Shortness of breath at rest [x] Shortness of breath with exertion. Vascular: [] Pain in legs with walking[] Pain in legsat rest[] Pain in legs when laying flat [] Claudication [] Pain in feet when walking [] Pain in feet at rest [] Pain in feet when laying flat [] History of DVT [] Phlebitis [] Swelling in legs [] Varicose veins [] Non-healing ulcers Pulmonary: [] Uses home oxygen [] Productive cough[] Hemoptysis [] Wheeze [] COPD [] Asthma Neurologic: [] Dizziness [] Blackouts [] Seizures [] History of stroke [] History of TIA[] Aphasia [] Temporary blindness[] Dysphagia [] Weaknessor numbness in arms [] Weakness or numbnessin legs Musculoskeletal: [] Arthritis [] Joint swelling [] Joint pain [] Low back pain Hematologic:[] Easy bruising[] Easy bleeding [] Hypercoagulable state [] Anemic [] Hepatitis Gastrointestinal:[] Blood in stool[] Vomiting blood[] Gastroesophageal reflux/heartburn[] Difficulty swallowing. Genitourinary: [] Chronic kidney disease [] Difficulturination [] Frequenturination [] Burning with urination[] Blood in urine Skin: [] Rashes [] Ulcers [] Wounds Psychological: [] History of anxiety[] History of major  depression.       Physical Examination  Vitals:   06/26/17 0956 06/26/17 0957  BP: 132/77 (!) 153/79  Pulse: 62 (!) 58  Resp: 15   Weight: 98.4 kg (217 lb)   Height: 6\' 4"  (1.93 m)    Body mass index is 26.41 kg/m. Gen:  WD/WN, NAD Head: [place]/AT, No temporalis wasting. Ear/Nose/Throat: Hearing grossly intact, nares w/o erythema or drainage, trachea midline Eyes: Conjunctiva clear. Sclera non-icteric Neck: Supple.  Carotid bruit Pulmonary:  Good air movement, equal and clear to auscultation bilaterally.  Cardiac: Irregular Vascular:  Vessel Right Left                                       Gastrointestinal: soft, non-tender/non-distended. No bruit Musculoskeletal: M/S 5/5 throughout.  No deformity or atrophy.  Trace lower extremity edema. Neurologic: CN 2-12 intact. Sensation grossly intact in extremities.  Symmetrical.  Speech is fluent. Motor exam as listed above. Psychiatric: Judgment intact, Mood & affect appropriate for pt's clinical situation. Dermatologic: No rashes or ulcers noted.  No cellulitis or open wounds.      CBC Lab Results  Component Value Date   WBC 7.2 12/11/2016   HGB 14.8 12/11/2016   HCT 46 12/11/2016   MCV 102.0 (H) 07/01/2016   PLT 216 12/11/2016  BMET    Component Value Date/Time   NA 142 12/11/2016   NA 140 10/04/2013 0418   K 4.5 12/11/2016   K 4.0 10/04/2013 0418   CL 105 07/01/2016 0001   CL 106 10/04/2013 0418   CO2 27 07/01/2016 0001   CO2 30 10/04/2013 0418   GLUCOSE 100 (H) 07/01/2016 0001   GLUCOSE 108 (H) 10/04/2013 0418   BUN 13 12/11/2016   BUN 10 10/04/2013 0418   CREATININE 1.7 (A) 12/11/2016   CREATININE 1.43 (H) 07/01/2016 0001   CALCIUM 9.2 07/01/2016 0001   CALCIUM 8.6 10/04/2013 0418   GFRNONAA 47 (L) 07/01/2016 0001   GFRAA 55 (L) 07/01/2016 0001   CrCl cannot be calculated (Patient's most recent lab result is older than the maximum 21 days allowed.).  COAG Lab Results  Component Value  Date   INR 1.0 10/01/2013   INR 1.0 11/25/2012    Radiology No results found.   Assessment/Plan Benign essential HTN blood pressure control important in reducing the progression of atherosclerotic disease. On appropriate oral medications.   Diabetes mellitus type 2 with complications blood glucose control important in reducing the progression of atherosclerotic disease. Also, involved in wound healing. On appropriate medications.    Bilateral carotid artery stenosis He has 40-59% recurrent stenosis in both carotid arteries after previous endarterectomy by duplex.  This is stable and has not progressed over time.  Continue antiplatelet and statin therapy.  Recheck in 1 year.  Chronic kidney disease (CKD), stage III (moderate) Stable.  Renal artery stenosis ([facility]) His renal artery duplex today shows no hemodynamically significant stenosis in the right renal artery stent although it was not particularly well seen.  Left renal artery has velocities consistent with a greater than 60% stenosis which have been present for some time. No changes for now.  No plan for intervention. Recheck in one year.    Leotis Pain, MD  06/26/2017 10:35 AM    This note was created with Dragon medical transcription system.  Any errors from dictation are purely unintentional

## 2017-06-29 ENCOUNTER — Other Ambulatory Visit: Payer: Medicare Other

## 2017-06-29 DIAGNOSIS — N183 Chronic kidney disease, stage 3 unspecified: Secondary | ICD-10-CM

## 2017-06-29 DIAGNOSIS — E782 Mixed hyperlipidemia: Secondary | ICD-10-CM

## 2017-06-29 DIAGNOSIS — I10 Essential (primary) hypertension: Secondary | ICD-10-CM

## 2017-06-29 DIAGNOSIS — R718 Other abnormality of red blood cells: Secondary | ICD-10-CM

## 2017-06-29 DIAGNOSIS — R7303 Prediabetes: Secondary | ICD-10-CM

## 2017-06-29 DIAGNOSIS — Z125 Encounter for screening for malignant neoplasm of prostate: Secondary | ICD-10-CM

## 2017-06-29 DIAGNOSIS — I701 Atherosclerosis of renal artery: Secondary | ICD-10-CM

## 2017-06-30 LAB — CBC WITH DIFFERENTIAL/PLATELET
Basophils Absolute: 63 cells/uL (ref 0–200)
Basophils Relative: 0.9 %
Eosinophils Absolute: 238 cells/uL (ref 15–500)
Eosinophils Relative: 3.4 %
HCT: 45 % (ref 38.5–50.0)
Hemoglobin: 15.3 g/dL (ref 13.2–17.1)
Lymphs Abs: 1379 cells/uL (ref 850–3900)
MCH: 33.6 pg — ABNORMAL HIGH (ref 27.0–33.0)
MCHC: 34 g/dL (ref 32.0–36.0)
MCV: 98.7 fL (ref 80.0–100.0)
MPV: 9.5 fL (ref 7.5–12.5)
Monocytes Relative: 9.9 %
Neutro Abs: 4627 cells/uL (ref 1500–7800)
Neutrophils Relative %: 66.1 %
Platelets: 295 10*3/uL (ref 140–400)
RBC: 4.56 10*6/uL (ref 4.20–5.80)
RDW: 11.8 % (ref 11.0–15.0)
Total Lymphocyte: 19.7 %
WBC mixed population: 693 cells/uL (ref 200–950)
WBC: 7 10*3/uL (ref 3.8–10.8)

## 2017-06-30 LAB — COMPLETE METABOLIC PANEL WITH GFR
AG Ratio: 1.3 (calc) (ref 1.0–2.5)
ALT: 22 U/L (ref 9–46)
AST: 24 U/L (ref 10–35)
Albumin: 4.1 g/dL (ref 3.6–5.1)
Alkaline phosphatase (APISO): 51 U/L (ref 40–115)
BUN/Creatinine Ratio: 14 (calc) (ref 6–22)
BUN: 20 mg/dL (ref 7–25)
CO2: 30 mmol/L (ref 20–32)
Calcium: 9.4 mg/dL (ref 8.6–10.3)
Chloride: 104 mmol/L (ref 98–110)
Creat: 1.42 mg/dL — ABNORMAL HIGH (ref 0.70–1.18)
GFR, Est African American: 55 mL/min/{1.73_m2} — ABNORMAL LOW (ref >=60)
GFR, Est Non African American: 47 mL/min/{1.73_m2} — ABNORMAL LOW (ref >=60)
Globulin: 3.1 g/dL (calc) (ref 1.9–3.7)
Glucose, Bld: 118 mg/dL — ABNORMAL HIGH (ref 65–99)
Potassium: 4.7 mmol/L (ref 3.5–5.3)
Sodium: 142 mmol/L (ref 135–146)
Total Bilirubin: 0.6 mg/dL (ref 0.2–1.2)
Total Protein: 7.2 g/dL (ref 6.1–8.1)

## 2017-06-30 LAB — HEMOGLOBIN A1C
Hgb A1c MFr Bld: 6.2 % of total Hgb — ABNORMAL HIGH (ref <5.7)
Mean Plasma Glucose: 131 (calc)
eAG (mmol/L): 7.3 (calc)

## 2017-06-30 LAB — LIPID PANEL
Cholesterol: 163 mg/dL (ref <200)
HDL: 39 mg/dL — ABNORMAL LOW (ref >40)
LDL Cholesterol (Calc): 103 mg/dL (calc) — ABNORMAL HIGH
Non-HDL Cholesterol (Calc): 124 mg/dL (calc) (ref <130)
Total CHOL/HDL Ratio: 4.2 (calc) (ref <5.0)
Triglycerides: 117 mg/dL (ref <150)

## 2017-06-30 LAB — PSA, TOTAL WITH REFLEX TO PSA, FREE: PSA, Total: 0.3 ng/mL (ref <=4.0)

## 2017-07-06 ENCOUNTER — Encounter: Payer: Self-pay | Admitting: Family Medicine

## 2017-07-06 ENCOUNTER — Ambulatory Visit (INDEPENDENT_AMBULATORY_CARE_PROVIDER_SITE_OTHER): Payer: Medicare Other | Admitting: Family Medicine

## 2017-07-06 VITALS — BP 133/52 | HR 56 | Temp 97.8°F | Resp 16 | Ht 76.0 in | Wt 219.6 lb

## 2017-07-06 DIAGNOSIS — R7303 Prediabetes: Secondary | ICD-10-CM

## 2017-07-06 DIAGNOSIS — I701 Atherosclerosis of renal artery: Secondary | ICD-10-CM

## 2017-07-06 DIAGNOSIS — E782 Mixed hyperlipidemia: Secondary | ICD-10-CM

## 2017-07-06 DIAGNOSIS — I480 Paroxysmal atrial fibrillation: Secondary | ICD-10-CM

## 2017-07-06 DIAGNOSIS — I10 Essential (primary) hypertension: Secondary | ICD-10-CM

## 2017-07-06 DIAGNOSIS — G40909 Epilepsy, unspecified, not intractable, without status epilepticus: Secondary | ICD-10-CM

## 2017-07-06 DIAGNOSIS — Z0000 Encounter for general adult medical examination without abnormal findings: Secondary | ICD-10-CM | POA: Diagnosis not present

## 2017-07-06 DIAGNOSIS — K219 Gastro-esophageal reflux disease without esophagitis: Secondary | ICD-10-CM

## 2017-07-06 MED ORDER — PANTOPRAZOLE SODIUM 40 MG PO TBEC: 40.0000 mg | DELAYED_RELEASE_TABLET | Freq: Two times a day (BID) | ORAL | 3 refills | Status: DC

## 2017-07-06 NOTE — Assessment & Plan Note (Signed)
Stable L RAS Followed by Vascular Surgery Dr Lucky Cowboy, now q 1 yr visits

## 2017-07-06 NOTE — Assessment & Plan Note (Addendum)
Controlled cholesterol on statin and lifestyle Last lipid panel 06/2017 ASCVD risk elevated due to PreDM, HTN, PAF, HLD, PAD s/p CABG  Plan: 1. Continue current meds - Simvastatin 40mg  2. Continue Plavix 3. Encourage improved lifestyle - low carb/cholesterol, reduce portion size, continue improving regular exercise 4. Follow-up lipids yearly

## 2017-07-06 NOTE — Assessment & Plan Note (Signed)
Stable chronic paroxysmal atrial fibrillation Followed by [facility] On rate control BB and Plavix, no longer on ASA. Not on anticoagulation

## 2017-07-06 NOTE — Assessment & Plan Note (Signed)
Stable controlled GERD on high dose PPI Last EGD / GI >5 years ago some dysphagia/stuck food Episodic issue with choking/stuck due to congestion and cough recently now resolved Continue Protonix 40 BID for now - advised caution in future may try slow taper down on dose Future consider referral to GI again review EGD

## 2017-07-06 NOTE — Progress Notes (Signed)
Subjective:    Patient ID: Keith Cruz, male    DOB: [birth year], 77 y.o.   MRN: [id number]  Keith Cruz is a 77 y.o. male presenting on 07/06/2017 for Annual Exam and Pre-Diabetes  Accompanied by wife, [name], who provides additional history.  HPI   Specialist: Nephrology - Dr Candiss Norse [facility]) Vascular - Dr Lucky Cowboy Cardiology - Dr Nehemiah [facility])  Pre-Diabetes Reports no new concerns. Prior A1c trend 6.0 to 6.2. Last reading 6.2 - He has complaints about variability of test strips, he sent some back to company and they sent more, but he still gets variable readings sometimes 120 to 150 5 min apart. CBGs: Avg 90 to 120s. High max < 150. Checks CBGs x 2 daily Meds: None Currently not on ACEi/ARB - discontinued per Renal Lifestyle: - Diet (tries to limit sugar and carbs, he eats a low K diet due to Nephrology rec, no other changes) - Exercise (active with walking, hand weights and riding bike - goal to inc exercise walking with warmer weather) Denies hypoglycemia  CHRONIC HTN: Reports no new concerns. Does check BP occasionally, has not checked recently. Current Meds - Metoprolol 50mg  BID Reports good compliance, took meds today. Tolerating well, w/o complaints.  Paroxysmal Atrial Fibrillation Followed by [facility] Cardiology, has been taken off Aspirin >1 yr ago, now only on Plavix 75mg  daily monotherapy, also no rate control with Metoprolol  GERD History of EGD >5 years ago due to history of dysphagia and chicken stuck in throat, this has resolved. Now he reported recent episode 2 months ago similar chocking something stuck in throat, but attributed to congestion and breathing, once this has resolved he was doing better no more choking Tolerating Protonix 40mg  BID, doing well, but still takes TUMs at times. Not ready to reduce dose  COPD Resolved recent exacerbation 05/2017, Finished Levaquin. Stable, has Flovent BID, has Albuterol rescue inhaler PRN, rarely  using No new concerns except some scattered wheezing at times he notices.  CKD-III / RAS s/p R Renal Stent Followed by [facility] Dr Candiss Norse and Vascular Surgery Dr Lucky Cowboy for R Renal Stent - He is followed about every 6 months with lab trend of Cr, last was mild elevated, now improved on our last Cr check 06/2017. He is off ARB due to hyperkalemia and CKD. He follows a Low K diet per Nephrology - Vascular now advised he can go every 1 year for follow-up  Health Maintenance: -UTD Flu Vaccine this season  Depression screen [facility] 2/9 07/06/2017 12/09/2016 07/01/2016  Decreased Interest 0 0 0  Down, Depressed, Hopeless 0 0 0  PHQ - 2 Score 0 0 0    Past Medical History:  Diagnosis Date  . Atherosclerosis of aorta ([place])   . Carotid stenosis, bilateral   . Chronic cough   . Coronary artery disease   . Dysrhythmia   . GERD (gastroesophageal reflux disease)   . Hypertension   . Kidney disease   . Myocardial infarction ([place])   . Seasonal allergies   . Seizures (HCC)    viral meningitis  . TIA (transient ischemic attack)   . Viral meningitis 2017   Past Surgical History:  Procedure Laterality Date  . CAROTID ENDARTERECTOMY Left 2014  . CAROTID ENDARTERECTOMY Right 1991  . CATARACT EXTRACTION W/PHACO Left 06/24/2017   Procedure: CATARACT EXTRACTION PHACO AND INTRAOCULAR LENS PLACEMENT ([place]) LEFT BORDERLINE DIABETIC;  Surgeon: Leandrew Koyanagi, MD;  Location: [place];  Service: Ophthalmology;  Laterality: Left;  .  CORONARY ARTERY BYPASS GRAFT    . URETERAL STENT PLACEMENT     Social History   Socioeconomic History  . Marital status: Single    Spouse name: Not on file  . Number of children: Not on file  . Years of education: Not on file  . Highest education level: Not on file  Social Needs  . Financial resource strain: Not on file  . Food insecurity - worry: Not on file  . Food insecurity - inability: Not on file  . Transportation needs - medical: Not on file  .  Transportation needs - non-medical: Not on file  Occupational History  . Not on file  Tobacco Use  . Smoking status: Former Smoker    Packs/day: 2.00    Years: 10.00    Pack years: 20.00    Types: Cigarettes    Last attempt to quit: 04/28/1970    Years since quitting: 47.2  . Smokeless tobacco: Former Network engineer and Sexual Activity  . Alcohol use: No    Alcohol/week: 0.0 oz    Comment: previous  . Drug use: No  . Sexual activity: Not on file  Other Topics Concern  . Not on file  Social History Narrative  . Not on file   Family History  Problem Relation Age of Onset  . Stroke Mother   . Heart attack Mother   . Stroke Father   . Stroke Paternal Grandmother   . Stroke Paternal Grandfather    Current Outpatient Medications on File Prior to Visit  Medication Sig  . albuterol (PROVENTIL HFA;VENTOLIN HFA) 108 (90 Base) MCG/ACT inhaler Inhale 1-2 puffs into the lungs every 4 (four) hours as needed for wheezing or shortness of breath.  . Cholecalciferol (VITAMIN D3) 2000 UNITS capsule Take 2,000 Units by mouth daily.  . Clobetasol Propionate 0.05 % lotion Apply topically.  . clopidogrel (PLAVIX) 75 MG tablet Take 1 tablet (75 mg total) by mouth daily.  . fexofenadine (ALLEGRA) 180 MG tablet TAKE 1 TABLET BY MOUTH DAILY  . FLOVENT HFA 110 MCG/ACT inhaler   . folic acid (FOLVITE) 1 MG tablet Take 0.8 mg by mouth daily.  Marland Kitchen glucose blood (ACCU-CHEK AVIVA PLUS) test strip Check blood sugar 1 x daily  . metoprolol tartrate (LOPRESSOR) 50 MG tablet Take 1 tablet (50 mg total) by mouth 2 (two) times daily.  . Multiple Vitamins-Minerals (CENTRUM SILVER 50+MEN PO) Take by mouth.  . simvastatin (ZOCOR) 40 MG tablet Take 1 tablet (40 mg total) by mouth daily.   No current facility-administered medications on file prior to visit.     Review of Systems  Constitutional: Negative for activity change, appetite change, chills, diaphoresis, fatigue, fever and unexpected weight change.  HENT:  Negative for congestion, hearing loss and sinus pressure.   Eyes: Negative for visual disturbance.  Respiratory: Negative for apnea, cough, chest tightness, shortness of breath and wheezing.   Cardiovascular: Negative for chest pain, palpitations and leg swelling.  Gastrointestinal: Negative for abdominal pain, anal bleeding, blood in stool, constipation, diarrhea, nausea and vomiting.  Endocrine: Negative for cold intolerance.  Genitourinary: Negative for decreased urine volume, difficulty urinating, dysuria, frequency, hematuria, scrotal swelling, testicular pain and urgency.  Musculoskeletal: Negative for arthralgias, back pain and neck pain.  Skin: Negative for rash.  Allergic/Immunologic: Negative for environmental allergies.  Neurological: Negative for dizziness, weakness, light-headedness, numbness and headaches.  Hematological: Negative for adenopathy.  Psychiatric/Behavioral: Negative for behavioral problems, dysphoric mood and sleep disturbance. The patient is not nervous/anxious.  Per HPI unless specifically indicated above     Objective:    BP (!) 133/52   Pulse (!) 56   Temp 97.8 F (36.6 C) (Oral)   Resp 16   Ht 6\' 4"  (1.93 m)   Wt 219 lb 9.6 oz (99.6 kg)   BMI 26.73 kg/m   Wt Readings from Last 3 Encounters:  07/06/17 219 lb 9.6 oz (99.6 kg)  06/26/17 217 lb (98.4 kg)  06/24/17 217 lb (98.4 kg)    Physical Exam  Constitutional: He is oriented to person, place, and time. He appears well-developed and well-nourished. No distress.  Well-appearing, comfortable, cooperative  HENT:  Head: Normocephalic and atraumatic.  Mouth/Throat: Oropharynx is clear and moist.  Frontal / maxillary sinuses non-tender. Nares patent without purulence or edema. Bilateral TMs clear without erythema, effusion or bulging, some chronic scar tissue on L TM. Oropharynx clear without erythema, exudates, edema or asymmetry.  Hearing Aid in R ear, removed for exam  Top denture in place.   Eyes: Conjunctivae and EOM are normal. Pupils are equal, round, and reactive to light. Right eye exhibits no discharge. Left eye exhibits no discharge.  Neck: Normal range of motion. Neck supple. No thyromegaly present.  Cardiovascular: Normal rate, regular rhythm, normal heart sounds and intact distal pulses.  No murmur heard. Pulmonary/Chest: Effort normal and breath sounds normal. No respiratory distress. He has no wheezes. He has no rales.  Abdominal: Soft. Bowel sounds are normal. He exhibits no distension and no mass. There is no tenderness.  Musculoskeletal: Normal range of motion. He exhibits no edema or tenderness.  Upper / Lower Extremities: - Normal muscle tone, strength bilateral upper extremities 5/5, lower extremities 5/5  Lymphadenopathy:    He has no cervical adenopathy.  Neurological: He is alert and oriented to person, place, and time.  Distal sensation intact to light touch all extremities  Skin: Skin is warm and dry. No rash noted. He is not diaphoretic. No erythema.  Psychiatric: He has a normal mood and affect. His behavior is normal.  Well groomed, good eye contact, normal speech and thoughts  Nursing note and vitals reviewed.  Results for orders placed or performed in visit on 06/29/17  PSA, Total with Reflex to PSA, Free  Result Value Ref Range   PSA, Total 0.3 < OR = 4.0 ng/mL  CBC with Differential/Platelet  Result Value Ref Range   WBC 7.0 3.8 - 10.8 Thousand/uL   RBC 4.56 4.20 - 5.80 Million/uL   Hemoglobin 15.3 13.2 - 17.1 g/dL   HCT 45.0 38.5 - 50.0 %   MCV 98.7 80.0 - 100.0 fL   MCH 33.6 (H) 27.0 - 33.0 pg   MCHC 34.0 32.0 - 36.0 g/dL   RDW 11.8 11.0 - 15.0 %   Platelets 295 140 - 400 Thousand/uL   MPV 9.5 7.5 - 12.5 fL   Neutro Abs 4,627 1,500 - 7,800 cells/uL   Lymphs Abs 1,379 850 - 3,900 cells/uL   WBC mixed population 693 200 - 950 cells/uL   Eosinophils Absolute 238 15 - 500 cells/uL   Basophils Absolute 63 0 - 200 cells/uL   Neutrophils  Relative % 66.1 %   Total Lymphocyte 19.7 %   Monocytes Relative 9.9 %   Eosinophils Relative 3.4 %   Basophils Relative 0.9 %  Hemoglobin A1c  Result Value Ref Range   Hgb A1c MFr Bld 6.2 (H) <5.7 % of total Hgb   Mean Plasma Glucose 131 (calc)   eAG (  mmol/L) 7.3 (calc)  Lipid panel  Result Value Ref Range   Cholesterol 163 <200 mg/dL   HDL 39 (L) >40 mg/dL   Triglycerides 117 <150 mg/dL   LDL Cholesterol (Calc) 103 (H) mg/dL (calc)   Total CHOL/HDL Ratio 4.2 <5.0 (calc)   Non-HDL Cholesterol (Calc) 124 <130 mg/dL (calc)  COMPLETE METABOLIC PANEL WITH GFR  Result Value Ref Range   Glucose, Bld 118 (H) 65 - 99 mg/dL   BUN 20 7 - 25 mg/dL   Creat 1.42 (H) 0.70 - 1.18 mg/dL   GFR, Est Non African American 47 (L) > OR = 60 mL/min/1.53m2   GFR, Est African American 55 (L) > OR = 60 mL/min/1.44m2   BUN/Creatinine Ratio 14 6 - 22 (calc)   Sodium 142 135 - 146 mmol/L   Potassium 4.7 3.5 - 5.3 mmol/L   Chloride 104 98 - 110 mmol/L   CO2 30 20 - 32 mmol/L   Calcium 9.4 8.6 - 10.3 mg/dL   Total Protein 7.2 6.1 - 8.1 g/dL   Albumin 4.1 3.6 - 5.1 g/dL   Globulin 3.1 1.9 - 3.7 g/dL (calc)   AG Ratio 1.3 1.0 - 2.5 (calc)   Total Bilirubin 0.6 0.2 - 1.2 mg/dL   Alkaline phosphatase (APISO) 51 40 - 115 U/L   AST 24 10 - 35 U/L   ALT 22 9 - 46 U/L      Assessment & Plan:   Problem List Items Addressed This Visit    Atrial fibrillation (HCC)    Stable chronic paroxysmal atrial fibrillation Followed by [facility] Cardiology On rate control BB and Plavix, no longer on ASA. Not on anticoagulation      Benign essential HTN    Well-controlled HTN Complication with CKD-III with RAS s/p R renal stent, followed by Renal - Renal has recommended against starting ARB due to hyperkalemia / CKD   Plan:  1. Continue current BP regimen - Metoprolol 50mg  BID 2. Encourage improved lifestyle - low sodium diet, resume regular exercise 3. Continue monitor BP outside office, bring readings to next visit,  if persistently >140/90 or new symptoms notify office sooner 4. Follow-up q 6 months      GERD (gastroesophageal reflux disease)    Stable controlled GERD on high dose PPI Last EGD / GI >5 years ago some dysphagia/stuck food Episodic issue with choking/stuck due to congestion and cough recently now resolved Continue Protonix 40 BID for now - advised caution in future may try slow taper down on dose Future consider referral to GI again review EGD      Relevant Medications   pantoprazole (PROTONIX) 40 MG tablet   Hyperlipidemia    Controlled cholesterol on statin and lifestyle Last lipid panel 06/2017 ASCVD risk elevated due to PreDM, HTN, PAF, HLD, PAD s/p CABG  Plan: 1. Continue current meds - Simvastatin 40mg  2. Continue Plavix 3. Encourage improved lifestyle - low carb/cholesterol, reduce portion size, continue improving regular exercise 4. Follow-up lipids yearly      Pre-diabetes    Stable PreDM A1c 6.2, from 6.0 to 6.2 Concern with CKD-III, HTN, HLD  Plan:  1. Not on any therapy currently - continue lifestyle. Encouraged to improve low carb, low sugar diet, reduce portion size, continue improving regular exercise 2. Follow-up in 6 months for A1c      Renal artery stenosis (HCC)    Stable L RAS Followed by Vascular Surgery Dr Lucky Cowboy, now q 1 yr visits  RESOLVED: Seizure disorder ([place])    1 episode due to viral meningitis No longer followed by Neurology Dr Melrose Nakayama Off seizure medicine       Other Visit Diagnoses    Annual physical exam    -  Primary      Meds ordered this encounter  Medications  . pantoprazole (PROTONIX) 40 MG tablet    Sig: Take 1 tablet (40 mg total) by mouth 2 (two) times daily.    Dispense:  180 tablet    Refill:  3    Follow up plan: Return in about 6 months (around 01/06/2018) for PreDM A1c, COPD, CKD.  Nobie Putnam, [facility] 07/06/2017, 9:39 AM

## 2017-07-06 NOTE — Assessment & Plan Note (Signed)
Stable PreDM A1c 6.2, from 6.0 to 6.2 Concern with CKD-III, HTN, HLD  Plan:  1. Not on any therapy currently - continue lifestyle. Encouraged to improve low carb, low sugar diet, reduce portion size, continue improving regular exercise 2. Follow-up in 6 months for A1c

## 2017-07-06 NOTE — Patient Instructions (Addendum)
Thank you for coming to the office today.  1.  For stomach acid, may reduce Protonix from 40mg  twice daily down to alternating days one daily and then next day twice daily for 1-2 weeks, then after few weeks may reduce to once daily - We can consider referral to GI for Upper Endoscopy  Continue low K diet per Renal - may ask Dr Candiss Norse about it again  We will check sugar again in 6 months  Please schedule a Follow-up Appointment to: Return in about 6 months (around 01/06/2018) for PreDM A1c, COPD, CKD.  If you have any other questions or concerns, please feel free to call the office or send a message through [place]. You may also schedule an earlier appointment if necessary.  Additionally, you may be receiving a survey about your experience at our office within a few days to 1 week by e-mail or mail. We value your feedback.  Nobie Putnam, DO [place]

## 2017-07-06 NOTE — Assessment & Plan Note (Signed)
Well-controlled HTN Complication with CKD-III with RAS s/p R renal stent, followed by Renal - Renal has recommended against starting ARB due to hyperkalemia / CKD   Plan:  1. Continue current BP regimen - Metoprolol 50mg  BID 2. Encourage improved lifestyle - low sodium diet, resume regular exercise 3. Continue monitor BP outside office, bring readings to next visit, if persistently >140/90 or new symptoms notify office sooner 4. Follow-up q 6 months

## 2017-07-06 NOTE — Assessment & Plan Note (Signed)
1 episode due to viral meningitis No longer followed by Neurology Dr Melrose Nakayama Off seizure medicine

## 2017-07-15 ENCOUNTER — Other Ambulatory Visit: Payer: Self-pay

## 2017-07-16 NOTE — Discharge Instructions (Signed)

## 2017-07-21 NOTE — Anesthesia Preprocedure Evaluation (Signed)
Anesthesia Evaluation  Patient identified by MRN, date of birth, ID band Patient awake    Reviewed: Allergy & Precautions, NPO status , Patient's Chart, lab work & pertinent test results  History of Anesthesia Complications Negative for: history of anesthetic complications  Airway Mallampati: III  TM Distance: >3 FB Neck ROM: Full    Dental  (+) Upper Dentures, Partial Lower   Pulmonary asthma , former smoker,    Pulmonary exam normal breath sounds clear to auscultation       Cardiovascular Exercise Tolerance: Good hypertension, + CAD (s/p CABG 5V 2015) and + Peripheral Vascular Disease (s/p CEA 2016, renal stent 2010)  Normal cardiovascular exam+ dysrhythmias (a fib)  Rhythm:Regular Rate:Normal     Neuro/Psych Seizures -,  PSYCHIATRIC DISORDERS Anxiety Depression TIA (1991; no deficits)   GI/Hepatic GERD  ,  Endo/Other  Pre-diabetes  Renal/GU Renal disease (stage III CKD)     Musculoskeletal   Abdominal   Peds  Hematology negative hematology ROS (+)   Anesthesia Other Findings   Reproductive/Obstetrics                             Anesthesia Physical  Anesthesia Plan  ASA: III  Anesthesia Plan: MAC   Post-op Pain Management:    Induction: Intravenous  PONV Risk Score and Plan: 1 and TIVA and Midazolam  Airway Management Planned: Natural Airway  Additional Equipment:   Intra-op Plan:   Post-operative Plan:   Informed Consent: I have reviewed the patients History and Physical, chart, labs and discussed the procedure including the risks, benefits and alternatives for the proposed anesthesia with the patient or authorized representative who has indicated his/her understanding and acceptance.     Plan Discussed with: CRNA  Anesthesia Plan Comments:         Anesthesia Quick Evaluation

## 2017-07-22 ENCOUNTER — Ambulatory Visit: Payer: Medicare Other | Admitting: Anesthesiology

## 2017-07-22 ENCOUNTER — Encounter
Admission: RE | Disposition: A | Discharge status: Home / Self Care | Payer: Self-pay | Source: Ambulatory Visit | Attending: Ophthalmology

## 2017-07-22 ENCOUNTER — Ambulatory Visit
Admission: RE | Admit: 2017-07-22 | Discharge: 2017-07-22 | Disposition: A | Discharge status: Home / Self Care | Payer: Medicare Other | Source: Ambulatory Visit | Attending: Ophthalmology | Admitting: Ophthalmology

## 2017-07-22 DIAGNOSIS — Z87891 Personal history of nicotine dependence: Secondary | ICD-10-CM | POA: Diagnosis not present

## 2017-07-22 DIAGNOSIS — I252 Old myocardial infarction: Secondary | ICD-10-CM | POA: Diagnosis not present

## 2017-07-22 DIAGNOSIS — Z951 Presence of aortocoronary bypass graft: Secondary | ICD-10-CM | POA: Diagnosis not present

## 2017-07-22 DIAGNOSIS — R7303 Prediabetes: Secondary | ICD-10-CM | POA: Diagnosis not present

## 2017-07-22 DIAGNOSIS — I2510 Atherosclerotic heart disease of native coronary artery without angina pectoris: Secondary | ICD-10-CM | POA: Diagnosis not present

## 2017-07-22 DIAGNOSIS — Z8673 Personal history of transient ischemic attack (TIA), and cerebral infarction without residual deficits: Secondary | ICD-10-CM | POA: Diagnosis not present

## 2017-07-22 DIAGNOSIS — I700 Atherosclerosis of aorta: Secondary | ICD-10-CM | POA: Insufficient documentation

## 2017-07-22 DIAGNOSIS — I129 Hypertensive chronic kidney disease with stage 1 through stage 4 chronic kidney disease, or unspecified chronic kidney disease: Secondary | ICD-10-CM | POA: Diagnosis not present

## 2017-07-22 DIAGNOSIS — N183 Chronic kidney disease, stage 3 (moderate): Secondary | ICD-10-CM | POA: Diagnosis not present

## 2017-07-22 DIAGNOSIS — H2511 Age-related nuclear cataract, right eye: Secondary | ICD-10-CM | POA: Diagnosis present

## 2017-07-22 HISTORY — DX: Occlusion and stenosis of unspecified carotid artery: I65.29

## 2017-07-22 HISTORY — DX: Atherosclerotic heart disease of native coronary artery without angina pectoris: I25.10

## 2017-07-22 HISTORY — DX: Unspecified hearing loss, unspecified ear: H91.90

## 2017-07-22 HISTORY — DX: Cardiac murmur, unspecified: R01.1

## 2017-07-22 HISTORY — DX: Chronic obstructive pulmonary disease, unspecified: J44.9

## 2017-07-22 HISTORY — DX: Unspecified asthma, uncomplicated: J45.909

## 2017-07-22 HISTORY — DX: Presence of external hearing-aid: Z97.4

## 2017-07-22 HISTORY — DX: Presence of dental prosthetic device (complete) (partial): Z97.2

## 2017-07-22 HISTORY — DX: Bronchitis, not specified as acute or chronic: J40

## 2017-07-22 HISTORY — DX: Unspecified osteoarthritis, unspecified site: M19.90

## 2017-07-22 HISTORY — DX: Pure hypercholesterolemia, unspecified: E78.00

## 2017-07-22 HISTORY — DX: Prediabetes: R73.03

## 2017-07-22 HISTORY — PX: CATARACT EXTRACTION W/PHACO: SHX586

## 2017-07-22 SURGERY — PHACOEMULSIFICATION, CATARACT, WITH IOL INSERTION
Anesthesia: Monitor Anesthesia Care | Site: Eye | Laterality: Right | Wound class: "Clean "

## 2017-07-22 MED ORDER — ACETAMINOPHEN 160 MG/5ML PO SOLN: 325.0000 mg | ORAL | Status: DC | PRN

## 2017-07-22 MED ORDER — ONDANSETRON HCL 4 MG/2ML IJ SOLN: 4.0000 mg | Freq: Once | INTRAMUSCULAR | Status: DC | PRN

## 2017-07-22 MED ORDER — ARMC OPHTHALMIC DILATING DROPS
1.0000 "application " | OPHTHALMIC | Status: DC | PRN
  Administered 2017-07-22 (×3): 1 via OPHTHALMIC

## 2017-07-22 MED ORDER — LACTATED RINGERS IV SOLN: INTRAVENOUS | Status: DC

## 2017-07-22 MED ORDER — NA HYALUR & NA CHOND-NA HYALUR 0.4-0.35 ML IO KIT
PACK | INTRAOCULAR | Status: DC | PRN
  Administered 2017-07-22: 1 mL via INTRAOCULAR

## 2017-07-22 MED ORDER — MOXIFLOXACIN HCL 0.5 % OP SOLN
1.0000 [drp] | OPHTHALMIC | Status: DC | PRN
  Administered 2017-07-22 (×3): 1 [drp] via OPHTHALMIC

## 2017-07-22 MED ORDER — LIDOCAINE HCL (PF) 2 % IJ SOLN
INTRAOCULAR | Status: DC | PRN
  Administered 2017-07-22: 1 mL

## 2017-07-22 MED ORDER — CEFUROXIME OPHTHALMIC INJECTION 1 MG/0.1 ML
INJECTION | OPHTHALMIC | Status: DC | PRN
  Administered 2017-07-22: 0.1 mL via INTRACAMERAL

## 2017-07-22 MED ORDER — MIDAZOLAM HCL 2 MG/2ML IJ SOLN
INTRAMUSCULAR | Status: DC | PRN
  Administered 2017-07-22: 2 mg via INTRAVENOUS

## 2017-07-22 MED ORDER — EPINEPHRINE PF 1 MG/ML IJ SOLN
INTRAOCULAR | Status: DC | PRN
  Administered 2017-07-22: 83 mL via OPHTHALMIC

## 2017-07-22 MED ORDER — FENTANYL CITRATE (PF) 100 MCG/2ML IJ SOLN
INTRAMUSCULAR | Status: DC | PRN
  Administered 2017-07-22: 100 ug via INTRAVENOUS

## 2017-07-22 MED ORDER — BRIMONIDINE TARTRATE-TIMOLOL 0.2-0.5 % OP SOLN
OPHTHALMIC | Status: DC | PRN
  Administered 2017-07-22: 1 [drp] via OPHTHALMIC

## 2017-07-22 MED ORDER — ACETAMINOPHEN 325 MG PO TABS: 650.0000 mg | ORAL_TABLET | Freq: Once | ORAL | Status: DC | PRN

## 2017-07-22 SURGICAL SUPPLY — 27 items

## 2017-07-22 NOTE — Transfer of Care (Signed)
Immediate Anesthesia Transfer of Care Note  Patient: Keith Cruz  Procedure(s) Performed: CATARACT EXTRACTION PHACO AND INTRAOCULAR LENS PLACEMENT (IOC) RIGHT BORDERLINE DIABETIC (Right Eye)  Patient Location: PACU  Anesthesia Type: MAC  Level of Consciousness: awake, alert  and patient cooperative  Airway and Oxygen Therapy: Patient Spontanous Breathing and Patient connected to supplemental oxygen  Post-op Assessment: Post-op Vital signs reviewed, Patient's Cardiovascular Status Stable, Respiratory Function Stable, Patent Airway and No signs of Nausea or vomiting  Post-op Vital Signs: Reviewed and stable  Complications: No apparent anesthesia complications

## 2017-07-22 NOTE — Op Note (Signed)
LOCATION:  [place]   PREOPERATIVE DIAGNOSIS:    Nuclear sclerotic cataract right eye. H25.11   POSTOPERATIVE DIAGNOSIS:  Nuclear sclerotic cataract right eye.     PROCEDURE:  Phacoemusification with posterior chamber intraocular lens placement of the right eye   LENS:   Implant Name Type Inv. Item Serial No. Manufacturer Lot No. LRB No. Used  LENS IOL DIOP 22.5 - [id number] Intraocular Lens LENS IOL DIOP 22.5 [id number]  Right 1        ULTRASOUND TIME: 16 % of 0 minutes, 42 seconds.  CDE 6.8   SURGEON:  Wyonia Hough, MD   ANESTHESIA:  Topical with tetracaine drops and 2% Xylocaine jelly, augmented with 1% preservative-free intracameral lidocaine.    COMPLICATIONS:  None.   DESCRIPTION OF PROCEDURE:  The patient was identified in the holding room and transported to the operating room and placed in the supine position under the operating microscope.  The right eye was identified as the operative eye and it was prepped and draped in the usual sterile ophthalmic fashion.   A 1 millimeter clear-corneal paracentesis was made at the 12:00 position.  0.5 ml of preservative-free 1% lidocaine was injected into the anterior chamber. The anterior chamber was filled with Viscoat viscoelastic.  A 2.4 millimeter keratome was used to make a near-clear corneal incision at the 9:00 position.  A curvilinear capsulorrhexis was made with a cystotome and capsulorrhexis forceps.  Balanced salt solution was used to hydrodissect and hydrodelineate the nucleus.   Phacoemulsification was then used in stop and chop fashion to remove the lens nucleus and epinucleus.  The remaining cortex was then removed using the irrigation and aspiration handpiece. Provisc was then placed into the capsular bag to distend it for lens placement.  A lens was then injected into the capsular bag.  The remaining viscoelastic was aspirated.   Wounds were hydrated with balanced salt solution.  The anterior  chamber was inflated to a physiologic pressure with balanced salt solution.  No wound leaks were noted. Cefuroxime 0.1 ml of a 10mg /ml solution was injected into the anterior chamber for a dose of 1 mg of intracameral antibiotic at the completion of the case.   Timolol and Brimonidine drops were applied to the eye.  The patient was taken to the recovery room in stable condition without complications of anesthesia or surgery.   Keith Cruz 07/22/2017, 9:06 AM

## 2017-07-22 NOTE — Anesthesia Procedure Notes (Signed)
Procedure Name: MAC Performed by: Arabell Neria, CRNA Pre-anesthesia Checklist: Patient identified, Emergency Drugs available, Suction available, Timeout performed and Patient being monitored Patient Re-evaluated:Patient Re-evaluated prior to induction Oxygen Delivery Method: Nasal cannula Placement Confirmation: positive ETCO2       

## 2017-07-22 NOTE — Anesthesia Postprocedure Evaluation (Signed)
Anesthesia Post Note  Patient: Keith Cruz  Procedure(s) Performed: CATARACT EXTRACTION PHACO AND INTRAOCULAR LENS PLACEMENT ([facility]) RIGHT BORDERLINE DIABETIC (Right Eye)  Patient location during evaluation: PACU Anesthesia Type: MAC Level of consciousness: awake and alert, oriented and patient cooperative Pain management: pain level controlled Vital Signs Assessment: post-procedure vital signs reviewed and stable Respiratory status: spontaneous breathing, nonlabored ventilation and respiratory function stable Cardiovascular status: blood pressure returned to baseline and stable Postop Assessment: adequate PO intake Anesthetic complications: no    Darrin Nipper

## 2017-07-22 NOTE — H&P (Signed)
The History and Physical notes are on paper, have been signed, and are to be scanned. The patient remains stable and unchanged from the H&P.   Previous H&P reviewed, patient examined, and there are no changes.  Zamir Staples 07/22/2017 8:15 AM

## 2017-07-23 ENCOUNTER — Encounter: Payer: Self-pay | Admitting: Ophthalmology

## 2017-08-11 ENCOUNTER — Encounter (INDEPENDENT_AMBULATORY_CARE_PROVIDER_SITE_OTHER): Payer: Medicare Other | Admitting: Ophthalmology

## 2017-08-17 ENCOUNTER — Ambulatory Visit: Payer: Medicare Other | Admitting: Family Medicine

## 2017-08-17 ENCOUNTER — Ambulatory Visit (INDEPENDENT_AMBULATORY_CARE_PROVIDER_SITE_OTHER): Payer: Medicare Other | Admitting: Family Medicine

## 2017-08-17 ENCOUNTER — Encounter: Payer: Self-pay | Admitting: Family Medicine

## 2017-08-17 VITALS — BP 119/62 | HR 73 | Temp 98.8°F | Resp 16 | Ht 76.0 in | Wt 219.0 lb

## 2017-08-17 DIAGNOSIS — R509 Fever, unspecified: Secondary | ICD-10-CM | POA: Diagnosis not present

## 2017-08-17 DIAGNOSIS — J019 Acute sinusitis, unspecified: Secondary | ICD-10-CM

## 2017-08-17 DIAGNOSIS — J301 Allergic rhinitis due to pollen: Secondary | ICD-10-CM | POA: Diagnosis not present

## 2017-08-17 LAB — POCT INFLUENZA A/B
Influenza A, POC: NEGATIVE
Influenza B, POC: NEGATIVE

## 2017-08-17 MED ORDER — BENZONATATE 100 MG PO CAPS: 100.0000 mg | ORAL_CAPSULE | Freq: Three times a day (TID) | ORAL | 0 refills | Status: DC | PRN

## 2017-08-17 NOTE — Progress Notes (Signed)
Subjective:    Patient ID: Keith Cruz, male    DOB: May 13, 1940, 77 y.o.   MRN: [id number]  Keith Cruz is a 77 y.o. male presenting on 08/17/2017 for Fever (low grade fever 99.8 onset 2 days )  Patient presents for a same day appointment.  HPI   ACUTE SINUSITIS / FEVER / RHINORRHEA - Reports symptoms started Thursday (4 days ago) with rhinorrhea and sinus drainage, then this improved and seemed to resolve, then by Saturday about 2 days ago he developed fever initially low grade then increased to 99+F. He has had fluctuating temp. Taking Tylenol with improvement and control fever. Tried some NyQuil last night and continues to take Mucinex every day. He has flonse at home not using currently. - Overall he feels like symptoms are intermittent, does not seem worsening in general - Taking Allegra 180mg  daily, tolerating well usually helps his allergies - Admits generalized muscle aches and fatigue - Admits occasional yellow mucus coughing - Denies shortness of breath, chest pain, nausea vomiting abdominal pain, rash, headache, sore throat, ear pain   Depression screen [facility] 2/9 07/06/2017 12/09/2016 07/01/2016  Decreased Interest 0 0 0  Down, Depressed, Hopeless 0 0 0  PHQ - 2 Score 0 0 0    Social History   Tobacco Use  . Smoking status: Former Smoker    Packs/day: 2.00    Years: 10.00    Pack years: 20.00    Types: Cigarettes    Last attempt to quit: 04/28/1970    Years since quitting: 47.3  . Smokeless tobacco: Never Used  Substance Use Topics  . Alcohol use: No    Alcohol/week: 0.0 oz    Comment: previous  . Drug use: No    Review of Systems Per HPI unless specifically indicated above     Objective:    BP 119/62   Pulse 73   Temp 98.8 F (37.1 C) (Oral)   Resp 16   Ht 6\' 4"  (1.93 m)   Wt 219 lb (99.3 kg)   SpO2 96%   BMI 26.66 kg/m   Wt Readings from Last 3 Encounters:  08/17/17 219 lb (99.3 kg)  07/22/17 218 lb (98.9 kg)  07/06/17 219 lb 9.6 oz (99.6 kg)      Physical Exam  Constitutional: He is oriented to person, place, and time. He appears well-developed and well-nourished. No distress.  Mostly well appearing slightly tired but still comfortable, cooperative  HENT:  Head: Normocephalic and atraumatic.  Mouth/Throat: Oropharynx is clear and moist.  Frontal / maxillary sinuses non-tender. Nares patent with some congestion without purulence or edema. Bilateral TMs clear without erythema, effusion or bulging - hearing aid removed from R ear. Oropharynx dry mucus mem and clear without erythema, exudates, edema or asymmetry.  Eyes: Conjunctivae are normal. Right eye exhibits no discharge. Left eye exhibits no discharge.  Neck: Normal range of motion. Neck supple. No thyromegaly present.  Cardiovascular: Normal rate, regular rhythm, normal heart sounds and intact distal pulses.  No murmur heard. Pulmonary/Chest: Effort normal. No respiratory distress. He has no rales.  Mild scattered lower lung fields with slight rhonchi, seems to clear with cough. Overall moving very good air still. No overt wheezing.  Musculoskeletal: Normal range of motion. He exhibits no edema.  Lymphadenopathy:    He has no cervical adenopathy.  Neurological: He is alert and oriented to person, place, and time.  Skin: Skin is warm and dry. No rash noted. He is not diaphoretic. No  erythema.  Psychiatric: His behavior is normal.  Nursing note and vitals reviewed.  Results for orders placed or performed in visit on 08/17/17  POCT Influenza A/B  Result Value Ref Range   Influenza A, POC Negative Negative   Influenza B, POC Negative Negative      Assessment & Plan:   Problem List Items Addressed This Visit    None    Visit Diagnoses    Acute rhinosinusitis    -  Primary   Relevant Medications   benzonatate (TESSALON) 100 MG capsule   Fever and chills       Relevant Orders   POCT Influenza A/B (Completed)   Hay fever          Consistent with acute  rhinosinusitis, likely initially viral URI vs allergic rhinitis component without evidence of bacterial infection. - Complicated by COPD history, POx is 96% on RA  Plan: 1. RAPID FLU - Negative - Reassurance, likely self-limited - no indication for antibiotics at this time 2. Continue Allegra, Mucinex, daily - RESTART existing Flonase or nasal steroid - no new rx today - May continue NyQuil can add DayQuill temporary - May use oral decongestant Supportive care with nasal saline OTC, hydration Return criteria reviewed - call back within 48 to 72 hours if worsening or not improve consider course of antibiotics for sinusitis vs COPD, note he is allergic to Prednisone   Meds ordered this encounter  Medications  . benzonatate (TESSALON) 100 MG capsule    Sig: Take 1 capsule (100 mg total) by mouth 3 (three) times daily as needed for cough.    Dispense:  30 capsule    Refill:  0    Follow up plan: Return in about 1 week (around 08/24/2017), or if symptoms worsen or fail to improve, for sinusitis.   Nobie Putnam, [facility] 08/17/2017, 10:06 AM

## 2017-08-17 NOTE — Patient Instructions (Addendum)
Thank you for coming to the office today.  1. It sounds like you have persistent Sinus Congestion or "Rhinosinusitis" - I do not think that this is a Bacterial Sinus Infection. Usually these are caused by Viruses or Allergies, and will run it's course in about 7 to 10 days. - No antibiotics are needed  - CONTINUE Allegra for now, Mucinex  May try OTC decongestant during the day or DayQuil as need during day  USE existing nasal steroid - Start nasal steroid Flonase 2 sprays in each nostril daily for 4-6 weeks, may repeat course seasonally or as needed  - Recommend to keep using Nasal Saline spray multiple times a day to help flush out congestion and clear sinuses - Improve hydration by drinking plenty of clear fluids (water, gatorade) to reduce secretions and thin congestion  - Congestion draining down throat can cause irritation. May try warm herbal tea with honey, cough drops  - Can take Tylenol or Ibuprofen as needed for fevers  Call back by 48 to 72 hours if not improved or any worsening - will likely proceed with antibiotic instead at that time.  If you develop persistent fever >101F for at least 3 consecutive days, headaches with sinus pain or pressure or persistent earache, please schedule a follow-up evaluation within next few days to week.   Please schedule a Follow-up Appointment to: Return in about 1 week (around 08/24/2017), or if symptoms worsen or fail to improve, for sinusitis.  If you have any other questions or concerns, please feel free to call the office or send a message through [place]. You may also schedule an earlier appointment if necessary.  Additionally, you may be receiving a survey about your experience at our office within a few days to 1 week by e-mail or mail. We value your feedback.  Nobie Putnam, DO [place]

## 2017-08-18 ENCOUNTER — Encounter (INDEPENDENT_AMBULATORY_CARE_PROVIDER_SITE_OTHER): Payer: Medicare Other | Admitting: Ophthalmology

## 2017-08-21 ENCOUNTER — Encounter: Payer: Self-pay | Admitting: Family Medicine

## 2017-08-21 DIAGNOSIS — J0140 Acute pansinusitis, unspecified: Secondary | ICD-10-CM

## 2017-08-21 MED ORDER — AMOXICILLIN-POT CLAVULANATE 875-125 MG PO TABS: 1.0000 | ORAL_TABLET | Freq: Two times a day (BID) | ORAL | 0 refills | Status: AC

## 2017-09-09 ENCOUNTER — Other Ambulatory Visit: Payer: Self-pay

## 2017-09-09 ENCOUNTER — Telehealth: Payer: Self-pay | Admitting: Family Medicine

## 2017-09-09 DIAGNOSIS — J4530 Mild persistent asthma, uncomplicated: Secondary | ICD-10-CM

## 2017-09-09 MED ORDER — FLUTICASONE PROPIONATE HFA 110 MCG/ACT IN AERO: 2.0000 | INHALATION_SPRAY | Freq: Two times a day (BID) | RESPIRATORY_TRACT | 5 refills | Status: DC

## 2017-09-09 NOTE — Telephone Encounter (Signed)
Pt needs refill on flovent sent to [facility].

## 2017-09-10 NOTE — Telephone Encounter (Signed)
Rx send on 09/09/2017.

## 2017-10-14 ENCOUNTER — Ambulatory Visit (INDEPENDENT_AMBULATORY_CARE_PROVIDER_SITE_OTHER): Payer: Medicare Other | Admitting: Family Medicine

## 2017-10-14 ENCOUNTER — Other Ambulatory Visit: Payer: Self-pay

## 2017-10-14 ENCOUNTER — Encounter: Payer: Self-pay | Admitting: Family Medicine

## 2017-10-14 ENCOUNTER — Other Ambulatory Visit: Payer: Self-pay | Admitting: Family Medicine

## 2017-10-14 VITALS — BP 105/53 | HR 60 | Temp 98.6°F | Ht 76.0 in | Wt 217.4 lb

## 2017-10-14 DIAGNOSIS — L282 Other prurigo: Secondary | ICD-10-CM

## 2017-10-14 DIAGNOSIS — J019 Acute sinusitis, unspecified: Secondary | ICD-10-CM | POA: Diagnosis not present

## 2017-10-14 MED ORDER — IPRATROPIUM BROMIDE 0.06 % NA SOLN: 2.0000 | Freq: Four times a day (QID) | NASAL | 0 refills | Status: DC

## 2017-10-14 MED ORDER — BENZONATATE 100 MG PO CAPS: 100.0000 mg | ORAL_CAPSULE | Freq: Three times a day (TID) | ORAL | 0 refills | Status: DC | PRN

## 2017-10-14 MED ORDER — CLOBETASOL PROPIONATE 0.05 % EX LOTN: TOPICAL_LOTION | CUTANEOUS | 2 refills | Status: DC

## 2017-10-14 MED ORDER — AZITHROMYCIN 250 MG PO TABS: ORAL_TABLET | ORAL | 0 refills | Status: DC

## 2017-10-14 MED ORDER — BETAMETHASONE DIPROPIONATE AUG 0.05 % EX OINT: TOPICAL_OINTMENT | Freq: Two times a day (BID) | CUTANEOUS | 2 refills | Status: DC

## 2017-10-14 NOTE — Patient Instructions (Addendum)
Thank you for coming to the office today.  1. It sounds like you have a Sinusitis (Bacterial Infection) - this most likely started as an Upper Respiratory Virus that has settled into an infection. Also can affect lower respiratory with bronchitis now  Start Azithromycin Z pak (antibiotic) 2 tabs day 1, then 1 tab x 4 days, complete entire course even if improved Start Tessalon Perls take 1 capsule up to 3 times a day as needed for cough Start Atrovent nasal spray decongestant 2 sprays in each nostril up to 4 times daily for 7 days May try OTC Mucinex (or may try Mucinex-DM for cough) up to 7-10 days then stop  May continue Loratadine (Claritin) 10mg  daily and Flonase 2 sprays in each nostril daily for next 4-6 weeks, then you may stop and use seasonally or as needed - Recommend to keep using Nasal Saline spray multiple times a day to help flush out congestion and clear sinuses - Improve hydration by drinking plenty of clear fluids (water, gatorade) to reduce secretions and thin congestion - Congestion draining down throat can cause irritation. May try warm herbal tea with honey, cough drops - Can take Tylenol or Ibuprofen as needed for fevers  If you develop persistent fever >101F for at least 3 consecutive days, headaches with sinus pain or pressure or persistent earache, please schedule a follow-up evaluation within next few days to week.  Or shortness of breath worsening cough chest pain then return sooner  Please schedule a Follow-up Appointment to: Return if symptoms worsen or fail to improve, for sinusitis / bronchitis.  If you have any other questions or concerns, please feel free to call the office or send a message through [place]. You may also schedule an earlier appointment if necessary.  Additionally, you may be receiving a survey about your experience at our office within a few days to 1 week by e-mail or mail. We value your feedback.  Nobie Putnam, DO [place]

## 2017-10-14 NOTE — Progress Notes (Signed)
Subjective:    Patient ID: Keith Cruz, male    DOB: March 14, 1941, 76 y.o.   MRN: [id number]  Keith Cruz is a 77 y.o. male presenting on 10/14/2017 for Cough (productive cough, nasal drainage, chest congestion and soreness in the left side x 4 days ) and Rash (still a rash from tick bite x 1 yr ago)   HPI   ACUTE RHINOSINUSITIS / Cough - Last visit with me 08/17/17, for similar problem with sinusitis symptoms, treated initially for viral URI but progressive symptoms he ultimately required antibiotic Augmentin on 08/21/17, he significantly improved on this and symptoms were completely resolved, see prior notes for background information. - Today patient reports now worsening again URI symptoms started 4 days ago over weekend, with progressive worsening thicker yellow sputum and drainage from both sinuses and from chest, some soreness on left flank rib due to coughing - No sick contact before, but now her wife is sick as well with same symptoms - Tried NyQuil without relief. In past Lavella Lemons has worked - Admits one episode of diarrhea over weekend that has resolved - Denies fevers or chills, nausea vomiting abdominal pain, rash, muscle aches, headache  Additionally history of persistent pruritic rash from old tick bite >1 year ago, he request refill of topical steroid and follow-up with Dermatology   Depression screen [facility] 2/9 07/06/2017 12/09/2016 07/01/2016  Decreased Interest 0 0 0  Down, Depressed, Hopeless 0 0 0  PHQ - 2 Score 0 0 0    Social History   Tobacco Use  . Smoking status: Former Smoker    Packs/day: 2.00    Years: 10.00    Pack years: 20.00    Types: Cigarettes    Last attempt to quit: 04/28/1970    Years since quitting: 47.4  . Smokeless tobacco: Never Used  Substance Use Topics  . Alcohol use: No    Alcohol/week: 0.0 oz    Comment: previous  . Drug use: No    Review of Systems Per HPI unless specifically indicated above     Objective:    BP (!) 105/53 (BP  Location: Right Arm, Patient Position: Sitting, Cuff Size: Large)   Pulse 60   Temp 98.6 F (37 C) (Oral)   Ht 6\' 4"  (1.93 m)   Wt 217 lb 6.4 oz (98.6 kg)   SpO2 97%   BMI 26.46 kg/m   Wt Readings from Last 3 Encounters:  10/14/17 217 lb 6.4 oz (98.6 kg)  08/17/17 219 lb (99.3 kg)  07/22/17 218 lb (98.9 kg)    Physical Exam  Constitutional: He is oriented to person, place, and time. He appears well-developed and well-nourished. No distress.  Mostly well appearing again slightly tired but still comfortable, cooperative  HENT:  Head: Normocephalic and atraumatic.  Mouth/Throat: Oropharynx is clear and moist.  Mild maxillary sinuses tender. Nares patent with some congestion without purulence or edema. Bilateral TMs clear without erythema, effusion or bulging - hearing aid removed from R ear. Oropharynx dry mucus mem and clear without erythema, exudates, edema or asymmetry.  Eyes: Conjunctivae are normal. Right eye exhibits no discharge. Left eye exhibits no discharge.  Neck: Normal range of motion. Neck supple. No thyromegaly present.  Cardiovascular: Normal rate, regular rhythm, normal heart sounds and intact distal pulses.  No murmur heard. Pulmonary/Chest: Effort normal. No respiratory distress. He has no rales.  Similar to last visit but seems somewhat improved - now only mild rhonchi R lower lung field. Clear with  cough.  Overall moving very good air still. No overt wheezing.  Musculoskeletal: Normal range of motion. He exhibits no edema.  Lymphadenopathy:    He has no cervical adenopathy.  Neurological: He is alert and oriented to person, place, and time.  Skin: Skin is warm and dry. No rash noted. He is not diaphoretic. No erythema.  Psychiatric: His behavior is normal.  Nursing note and vitals reviewed.      Assessment & Plan:   Problem List Items Addressed This Visit    None    Visit Diagnoses    Acute rhinosinusitis    -  Primary   Relevant Medications    benzonatate (TESSALON) 100 MG capsule   azithromycin (ZITHROMAX Z-PAK) 250 MG tablet   ipratropium (ATROVENT) 0.06 % nasal spray   Pruritic rash          Consistent with acute maxillary vs rhinosinusitis, likely initially viral URI recurrent trigger possibly some associated allergic rhinitis component with now worsening concern for bacterial infection including primarily worse lower respiratory symptoms, thicker yellow sputum productive cough. Afebrile, some abnormal lung sounds without wheezing or fine crackles. - last episode 6 weeks ago, resolved 100% with Augmentin   Plan: 1. Given recurrent episode in age 31 patient will provide again trial of different antibiotic coverage - Start Azithromycin Z pak (antibiotic) 2 tabs day 1, then 1 tab x 4 days, complete entire course even if improved - Start Tessalon Perls take 1 capsule up to 3 times a day as needed for cough - May try OTC Mucinex (or may try Mucinex-DM for cough) up to 7-10 days then stop - Start Atrovent nasal spray decongestant 2 sprays in each nostril up to 4 times daily for 7 days - Continue allergy medication, Flonase Supportive care Follow-up if not improving, consider future X-ray vs Prednisone if cough not improve   Meds ordered this encounter  Medications  . DISCONTD: Clobetasol Propionate 0.05 % lotion    Sig: Apply topically 1-2 times day as needed for up to 1 week    Dispense:  59 mL    Refill:  2  . benzonatate (TESSALON) 100 MG capsule    Sig: Take 1 capsule (100 mg total) by mouth 3 (three) times daily as needed for cough.    Dispense:  30 capsule    Refill:  0  . azithromycin (ZITHROMAX Z-PAK) 250 MG tablet    Sig: Take 2 tabs (500mg  total) on Day 1. Take 1 tab (250mg ) daily for next 4 days.    Dispense:  6 tablet    Refill:  0  . ipratropium (ATROVENT) 0.06 % nasal spray    Sig: Place 2 sprays into both nostrils 4 (four) times daily. For up to 5-7 days then stop.    Dispense:  15 mL    Refill:  0       Follow up plan: Return if symptoms worsen or fail to improve, for sinusitis / bronchitis.  Nobie Putnam, [facility] 10/14/2017, 11:06 AM

## 2017-11-09 ENCOUNTER — Encounter: Payer: Self-pay | Admitting: Family Medicine

## 2017-11-29 ENCOUNTER — Other Ambulatory Visit: Payer: Self-pay | Admitting: Nurse Practitioner

## 2017-11-29 DIAGNOSIS — I10 Essential (primary) hypertension: Secondary | ICD-10-CM

## 2018-01-07 ENCOUNTER — Encounter: Payer: Self-pay | Admitting: Family Medicine

## 2018-01-11 ENCOUNTER — Ambulatory Visit: Payer: Medicare Other | Admitting: Family Medicine

## 2018-01-14 ENCOUNTER — Ambulatory Visit: Payer: Medicare Other | Admitting: Family Medicine

## 2018-01-14 ENCOUNTER — Other Ambulatory Visit: Payer: Self-pay | Admitting: Family Medicine

## 2018-01-14 DIAGNOSIS — I129 Hypertensive chronic kidney disease with stage 1 through stage 4 chronic kidney disease, or unspecified chronic kidney disease: Secondary | ICD-10-CM | POA: Diagnosis not present

## 2018-01-14 DIAGNOSIS — I701 Atherosclerosis of renal artery: Secondary | ICD-10-CM | POA: Diagnosis not present

## 2018-01-14 DIAGNOSIS — E7800 Pure hypercholesterolemia, unspecified: Secondary | ICD-10-CM

## 2018-01-14 DIAGNOSIS — E875 Hyperkalemia: Secondary | ICD-10-CM | POA: Diagnosis not present

## 2018-01-14 DIAGNOSIS — N183 Chronic kidney disease, stage 3 (moderate): Secondary | ICD-10-CM | POA: Diagnosis not present

## 2018-01-15 LAB — BASIC METABOLIC PANEL
BUN: 22 — AB (ref 4–21)
Creatinine: 1.5 — AB (ref 0.6–1.3)
Glucose: 97
Potassium: 4.7 (ref 3.4–5.3)
Sodium: 140 (ref 137–147)

## 2018-01-15 LAB — CBC AND DIFFERENTIAL
HCT: 43 (ref 41–53)
Hemoglobin: 14.8 (ref 13.5–17.5)
Platelets: 260 (ref 150–399)
WBC: 7.7

## 2018-01-15 LAB — PTH, INTACT: PTH, Intact: 37 (ref 14–64)

## 2018-01-19 ENCOUNTER — Encounter: Payer: Self-pay | Admitting: Family Medicine

## 2018-01-19 ENCOUNTER — Ambulatory Visit (INDEPENDENT_AMBULATORY_CARE_PROVIDER_SITE_OTHER): Payer: Medicare Other

## 2018-01-19 ENCOUNTER — Ambulatory Visit (INDEPENDENT_AMBULATORY_CARE_PROVIDER_SITE_OTHER): Payer: Medicare Other | Admitting: Family Medicine

## 2018-01-19 ENCOUNTER — Other Ambulatory Visit: Payer: Self-pay | Admitting: Family Medicine

## 2018-01-19 VITALS — BP 132/74 | HR 86 | Temp 97.5°F | Resp 16 | Ht 76.0 in | Wt 219.4 lb

## 2018-01-19 DIAGNOSIS — R351 Nocturia: Secondary | ICD-10-CM

## 2018-01-19 DIAGNOSIS — S46812A Strain of other muscles, fascia and tendons at shoulder and upper arm level, left arm, initial encounter: Secondary | ICD-10-CM

## 2018-01-19 DIAGNOSIS — N183 Chronic kidney disease, stage 3 unspecified: Secondary | ICD-10-CM

## 2018-01-19 DIAGNOSIS — H8110 Benign paroxysmal vertigo, unspecified ear: Secondary | ICD-10-CM | POA: Diagnosis not present

## 2018-01-19 DIAGNOSIS — R7303 Prediabetes: Secondary | ICD-10-CM

## 2018-01-19 DIAGNOSIS — R718 Other abnormality of red blood cells: Secondary | ICD-10-CM

## 2018-01-19 DIAGNOSIS — Z0000 Encounter for general adult medical examination without abnormal findings: Secondary | ICD-10-CM

## 2018-01-19 DIAGNOSIS — I10 Essential (primary) hypertension: Secondary | ICD-10-CM

## 2018-01-19 DIAGNOSIS — I701 Atherosclerosis of renal artery: Secondary | ICD-10-CM

## 2018-01-19 DIAGNOSIS — E782 Mixed hyperlipidemia: Secondary | ICD-10-CM

## 2018-01-19 DIAGNOSIS — J410 Simple chronic bronchitis: Secondary | ICD-10-CM

## 2018-01-19 DIAGNOSIS — K219 Gastro-esophageal reflux disease without esophagitis: Secondary | ICD-10-CM

## 2018-01-19 DIAGNOSIS — M25512 Pain in left shoulder: Secondary | ICD-10-CM | POA: Diagnosis not present

## 2018-01-19 LAB — POCT GLYCOSYLATED HEMOGLOBIN (HGB A1C): Hemoglobin A1C: 6.1 % — AB (ref 4.0–5.6)

## 2018-01-19 MED ORDER — CYCLOBENZAPRINE HCL 10 MG PO TABS: 5.0000 mg | ORAL_TABLET | Freq: Three times a day (TID) | ORAL | 0 refills | Status: DC | PRN

## 2018-01-19 MED ORDER — GLUCOSE BLOOD VI STRP: ORAL_STRIP | 5 refills | Status: DC

## 2018-01-19 MED ORDER — LIDOCAINE HCL (PF) 1 % IJ SOLN
2.0000 mL | Freq: Once | INTRAMUSCULAR | Status: AC
  Administered 2018-01-19: 2 mL

## 2018-01-19 MED ORDER — TRIAMCINOLONE ACETONIDE 40 MG/ML IJ SUSP
20.0000 mg | Freq: Once | INTRAMUSCULAR | Status: AC
  Administered 2018-01-19: 20 mg via INTRAMUSCULAR

## 2018-01-19 NOTE — Patient Instructions (Addendum)
Thank you for coming to the office today.  A1c 6.1 today, great job, keep up the good work.  No medication changes  Refilled test strips  You received a Left back/shoulder muscle trigger point injection - Lidocaine numbing medicine may ease the pain initially for a few hours until it wears off - As discussed, you may experience a "steroid flare" this evening or within 24-48 hours, anytime medicine is injected into an inflamed joint it can cause the pain to get worse temporarily - Everyone responds differently to these injections, it depends on the patient and the severity of the joint problem, it may provide anywhere from days to weeks, to months of relief. Ideal response is >6 months relief - Try to take it easy for next 1-2 days, avoid over activity and strain on joint (limit lifting for shoulder) - Recommend the following:   - For swelling - rest, compression sleeve / ACE wrap, elevation, and ice packs and heat as needed for first few days   - For pain in future may use heating pad or moist heat as needed  Start Cyclobenzapine (Flexeril) 10mg  tablets (muscle relaxant) - start with half (cut) to one whole pill at night for muscle relaxant - may make you sedated or sleepy (be careful driving or working on this) if tolerated you can take half to whole tab 2 to 3 times daily or every 8 hours as needed   Please schedule a Follow-up Appointment to: Return in about 6 months (around 07/20/2018) for Annual Physical.  If you have any other questions or concerns, please feel free to call the office or send a message through [place]. You may also schedule an earlier appointment if necessary.  Additionally, you may be receiving a survey about your experience at our office within a few days to 1 week by e-mail or mail. We value your feedback.  Nobie Putnam, DO [facility] [place]  1. You have symptoms of Vertigo (Benign Paroxysmal Positional Vertigo) - This is commonly caused  by inner ear fluid imbalance, sometimes can be worsened by allergies and sinus symptoms, otherwise it can occur randomly sometimes and we may never discover the exact cause. - To treat this, try the Epley Manuever (see diagrams/instructions below) at home up to 3 times a day for 1-2 weeks or until symptoms resolve - You may take Meclizine as needed up to 3 times a day for dizziness, this will not cure symptoms but may help. Caution may make you drowsy.  If you develop significant worsening episode with vertigo that does not improve and you get severe headache, loss of vision, arm or leg weakness, slurred speech, or other concerning symptoms please seek immediate medical attention at Emergency Department.  Please schedule a follow-up appointment with Dr Parks Ranger within 4 weeks if Vertigo not improving, and will consider Referral to Vestibular Rehab  See the next page for images describing the Epley Manuever.     ----------------------------------------------------------------------------------------------------------------------

## 2018-01-19 NOTE — Progress Notes (Signed)
Subjective:    Patient ID: Keith Cruz, male    DOB: Apr 01, 1941, 77 y.o.   MRN: [id number]  Keith Cruz is a 77 y.o. male presenting on 01/19/2018 for Diabetes   HPI   Specialist: Nephrology - Dr Candiss Norse [facility]) Vascular - Dr Lucky Cowboy Cardiology - Dr Nehemiah Massed [facility] Cardiology)  Pre-Diabetes Reports no new concerns.Prior A1c trend 6.0 to 6.2. Today 6.1 - He has complaints about variability of test strips, he sent some back to company and they sent more, but he still gets variable readings sometimes 120 to 150 5 min apart. CBGs: Avg 105-110.Low 96. Highmax < 150. Checks CBGs x 2 daily Meds: None Currently not on ACEi/ARB- discontinued per Renal Lifestyle: - Diet (tries to limit sugar and carbs, he eats a low K diet due to Nephrology rec, no other changes) - Exercise (active with walking,hand weights and riding bike - goal to inc exercise walking with warmer weather) Denies hypoglycemia  Left Shoulder Pain / Neck / Shoulder Muscle spasm / Trigger Point Reports symptoms started this past Sunday after church about 2 days ago, he had some symptoms of "numbness" and "tingling" down into finger tips more in middle. He has some muscle soreness and neck pain, worse with turning head to the Left. Seems to have some improvement in numbness in Left arm now today, seems to be intermittent, worse in evening. Tried heating pad last night with significant improvement. - Not taken other muscle relaxant before - Denies injury or trauma, other joint or muscle soreness swelling or pain  CKD-III/ RAS s/p R Renal Stent Followed by [facility] Dr Candiss Norse and Vascular Surgery Dr Lucky Cowboy for R Renal Stent - He is followed about every 6 months with lab trend of Cr, last was mild elevated and since improved. - Last visit within past 1 week to Dr Candiss Norse, he has faxed copy of lab results that were abstracted. - Remains off ARB due to Hyperkalemia and CKD  Additional complaint Vertigo Episodic - he has history of  inner ear vertigo symptoms, seems balance related, periodically only. Has not tried Epley maneuver. Not actively flare up now. Asking for advice.   Health Maintenance: UTD Flu vaccine this season.  Depression screen [facility] 2/9 01/19/2018 07/06/2017 12/09/2016  Decreased Interest 0 0 0  Down, Depressed, Hopeless 0 0 0  PHQ - 2 Score 0 0 0    Social History   Tobacco Use  . Smoking status: Former Smoker    Packs/day: 2.00    Years: 10.00    Pack years: 20.00    Types: Cigarettes    Last attempt to quit: 04/28/1970    Years since quitting: 47.7  . Smokeless tobacco: Never Used  Substance Use Topics  . Alcohol use: No    Alcohol/week: 0.0 standard drinks    Comment: previous  . Drug use: No    Review of Systems Per HPI unless specifically indicated above     Objective:    BP 132/74   Pulse 86   Temp (!) 97.5 F (36.4 C) (Oral)   Resp 16   Ht 6\' 4"  (1.93 m)   Wt 219 lb 6.4 oz (99.5 kg)   BMI 26.71 kg/m   Wt Readings from Last 3 Encounters:  01/19/18 219 lb 6.4 oz (99.5 kg)  01/19/18 219 lb 6.4 oz (99.5 kg)  10/14/17 217 lb 6.4 oz (98.6 kg)    Physical Exam  Constitutional: He is oriented to person, place, and time. He appears  well-developed and well-nourished. No distress.  Well-appearing, comfortable, cooperative  HENT:  Head: Normocephalic and atraumatic.  Mouth/Throat: Oropharynx is clear and moist.  Eyes: Conjunctivae are normal. Right eye exhibits no discharge. Left eye exhibits no discharge.  Neck: No thyromegaly present.  Neck Inspection: mostly normal appear Palpation: muscle hypertonicity L upper paracervical and trapezius and specifically L levator scap near scapular border with palpable muscle knot tender ROM: reduce L rotation neck. Full active ROM L shoulder Special Testing: negative rotator cuff testing. Spurling Maneuver negative for provoked or reproduced radiculopathy Strength: distal intact 5/5 upper ext Neurovascular: distal intact current upper  ext  Cardiovascular: Normal rate, regular rhythm, normal heart sounds and intact distal pulses.  No murmur heard. Pulmonary/Chest: Effort normal and breath sounds normal. No respiratory distress. He has no wheezes. He has no rales.  Musculoskeletal: He exhibits no edema.  Lymphadenopathy:    He has no cervical adenopathy.  Neurological: He is alert and oriented to person, place, and time.  Skin: Skin is warm and dry. No rash noted. He is not diaphoretic. No erythema.  Psychiatric: He has a normal mood and affect. His behavior is normal.  Well groomed, good eye contact, normal speech and thoughts  Nursing note and vitals reviewed.   ______________________________________________________ PROCEDURE NOTE Date: 01/19/18 Left levator scap posterior shoulder Trigger Point injection Discussed benefits and risks (including pain, bleeding, infection, steroid flare). Verbal consent given by patient. Medication:  0.5 cc Kenalog 40mg  and 2 cc Lidocaine 1% without epi Time Out taken  Landmarks identified, Left levator scap near superior medial aspect of scapular posteriorly located with palpable muscle knot and localized tenderness. Area cleansed with alcohol wipes, cold spray used for superficial anesthetic.Using 23 gauge needle, medicine was injected in a wheel fashion in the area.Sterile bandage placed.Patient tolerated procedure well without bleeding or paresthesias.No complications.   Recent Labs    06/29/17 0946 01/19/18 1139  HGBA1C 6.2* 6.1*    Results for orders placed or performed in visit on 01/19/18  CBC and differential  Result Value Ref Range   Hemoglobin 14.8 13.5 - 17.5   HCT 43 41 - 53   Platelets 260 150 - 399   WBC 7.7   Basic metabolic panel  Result Value Ref Range   Glucose 97    BUN 22 (A) 4 - 21   Creatinine 1.5 (A) 0.6 - 1.3   Potassium 4.7 3.4 - 5.3   Sodium 140 137 - 147  PTH, intact  Result Value Ref Range   PTH, Intact 37 14 - 64  POCT HgB A1C  Result  Value Ref Range   Hemoglobin A1C 6.1 (A) 4.0 - 5.6 %      Assessment & Plan:   Problem List Items Addressed This Visit    Pre-diabetes - Primary    Stable PreDM A1c 6.1 today, from prior 6.0 to 6.2 Concern with CKD-III, HTN, HLD  Plan:  1. Not on any therapy currently - continue lifestyle. Encouraged to improve low carb, low sugar diet, reduce portion size, continue improving regular exercise 2. Follow-up in 6 months for A1c w/ labs annual      Relevant Medications   glucose blood (ACCU-CHEK AVIVA PLUS) test strip   Other Relevant Orders   POCT HgB A1C (Completed)    Other Visit Diagnoses    Strain of left levator scapulae muscle, initial encounter       Relevant Medications   lidocaine (PF) (XYLOCAINE) 1 % injection 2 mL (Completed)  triamcinolone acetonide (KENALOG-40) injection 20 mg (Completed)   cyclobenzaprine (FLEXERIL) 10 MG tablet   Trigger point of left shoulder region       Relevant Medications   lidocaine (PF) (XYLOCAINE) 1 % injection 2 mL (Completed)   triamcinolone acetonide (KENALOG-40) injection 20 mg (Completed)   cyclobenzaprine (FLEXERIL) 10 MG tablet  Consistent with localized neck vs trapezius muscle spasm with localized levator scap trigger point and tenderness reproduced on exam, some radiating pain down L arm with paresthesias only episodic. Possible strain/injury due to position / ?sleep, otherwise no known injury Known underlying OA/DJD likely factor  Plan: 1. Today L levator scap trigger point injection, see procedure note. He had improved range of motion and less pain today after injection. - Lidocaine and kenalog injection - Of note he has documented intolerance, not allergy, to oral prednisone with some confusion symptoms. 2. Continue ice, heat alternating and conservative care 3. Rx Flexeril 5-10mg  TID PRN caution sedation, safe for kidney due to CKD, instead of baclofen due to renal clearance - Recommend stretching, avoid heavy lifting /  repetitive activities, Tylenol PRN Return if not improving     Benign paroxysmal positional vertigo, unspecified laterality      Suspected recurrent chronic BPPV by history and prior dx - No other significant neurological findings or focal deficits  Plan: 1. Handout given with Epley maneuver TID for 1-2 weeks until resolved 2. Return criteria, if not improved consider vestibular PT referral       Meds ordered this encounter  Medications  . lidocaine (PF) (XYLOCAINE) 1 % injection 2 mL  . triamcinolone acetonide (KENALOG-40) injection 20 mg  . glucose blood (ACCU-CHEK AVIVA PLUS) test strip    Sig: Check blood sugar 1 x daily    Dispense:  100 each    Refill:  5    90 day supply  . cyclobenzaprine (FLEXERIL) 10 MG tablet    Sig: Take 0.5-1 tablets (5-10 mg total) by mouth 3 (three) times daily as needed for muscle spasms.    Dispense:  30 tablet    Refill:  0    Follow up plan: Return in about 6 months (around 07/20/2018) for Annual Physical.  Future labs ordered for 07/15/18  Nobie Putnam, [facility] 01/19/2018, 1:02 PM

## 2018-01-19 NOTE — Progress Notes (Signed)
Subjective:   Keith Cruz is a 77 y.o. male who presents for Medicare Annual/Subsequent preventive examination.  Review of Systems:  Cardiac Risk Factors include: advanced age (>55men, >36 women);hypertension;male gender;dyslipidemia     Objective:    Vitals: BP 132/74 (BP Location: Left Arm, Patient Position: Sitting)   Pulse 86   Temp (!) 97.5 F (36.4 C) (Oral)   Resp 16   Ht 6\' 4"  (1.93 m)   Wt 219 lb 6.4 oz (99.5 kg)   BMI 26.71 kg/m   Body mass index is 26.71 kg/m.  Advanced Directives 01/19/2018 07/22/2017 06/24/2017 12/09/2016 06/24/2016  Does Patient Have a Medical Advance Directive? Yes Yes Yes Yes Yes  Type of Advance Directive Living will;Healthcare Power of [place];Living will [place];Living will [place];Living will [place] in Chart? No - copy requested Yes No - copy requested No - copy requested -    Tobacco Social History   Tobacco Use  Smoking Status Former Smoker  . Packs/day: 2.00  . Years: 10.00  . Pack years: 20.00  . Types: Cigarettes  . Last attempt to quit: 04/28/1970  . Years since quitting: 47.7  Smokeless Tobacco Never Used     Counseling given: Not Answered   Clinical Intake:  Pre-visit preparation completed: Yes  Pain : 0-10 Pain Score: 8  Pain Type: Acute pain Pain Location: Shoulder Pain Orientation: Left Pain Descriptors / Indicators: Tingling, Numbness, Sharp Pain Onset: In the past 7 days Pain Frequency: Constant Pain Relieving Factors: tylenol , heating pad Effect of Pain on Daily Activities: turning head makes it worse   Pain Relieving Factors: tylenol , heating pad  Nutritional Status: BMI 25 -29 Overweight Nutritional Risks: None Diabetes: No  How often do you need to have someone help you when you read instructions, pamphlets, or other written materials from your doctor or pharmacy?: 1 -  Never What is the last grade level you completed in school?: 10th grade  Interpreter Needed?: No  Information entered by :: Rayah Fines,LPN   Past Medical History:  Diagnosis Date  . Arthritis    back  . Asthma   . Atherosclerosis of aorta ([place])   . Atherosclerosis of aorta ([place])   . Bronchitis 05/2017   chronic  . CAD (coronary artery disease)   . Carotid stenosis   . Carotid stenosis, bilateral   . Chronic cough   . COPD (chronic obstructive pulmonary disease) ([place])   . Dysrhythmia   . GERD (gastroesophageal reflux disease)   . Heart murmur   . HOH (hard of hearing)    hearing aids  . Hypercholesteremia   . Hypertension   . Kidney disease    stent  . Macular degeneration of left eye   . Myocardial infarction ([place]) 2016  . Pre-diabetes   . Seasonal allergies   . Seizures ([facility])    viral meningitis  . TIA (transient ischemic attack) 1991   x2  . Uses hearing aid   . Viral meningitis 2017  . Wears dentures    upper and lower   Past Surgical History:  Procedure Laterality Date  . CAROTID ENDARTERECTOMY Left 2014  . CAROTID ENDARTERECTOMY Right 1991  . CATARACT EXTRACTION W/PHACO Left 06/24/2017   Procedure: CATARACT EXTRACTION PHACO AND INTRAOCULAR LENS PLACEMENT ([place]) LEFT BORDERLINE DIABETIC;  Surgeon: Leandrew Koyanagi, MD;  Location: [facility];  Service: Ophthalmology;  Laterality: Left;  .  CATARACT EXTRACTION W/PHACO Right 07/22/2017   Procedure: CATARACT EXTRACTION PHACO AND INTRAOCULAR LENS PLACEMENT ([place]) RIGHT BORDERLINE DIABETIC;  Surgeon: Leandrew Koyanagi, MD;  Location: [facility];  Service: Ophthalmology;  Laterality: Right;  . CORONARY ARTERY BYPASS GRAFT     x5  . SKIN LESION EXCISION Left    left ear  . URETERAL STENT PLACEMENT     Family History  Problem Relation Age of Onset  . Stroke Mother   . Heart attack Mother   . Stroke Father   . Stroke Paternal Grandmother   . Stroke Paternal Grandfather    Social History    Socioeconomic History  . Marital status: Married    Spouse name: Not on file  . Number of children: Not on file  . Years of education: Not on file  . Highest education level: 10th grade  Occupational History  . Not on file  Social Needs  . Financial resource strain: Not hard at all  . Food insecurity:    Worry: Never true    Inability: Never true  . Transportation needs:    Medical: No    Non-medical: No  Tobacco Use  . Smoking status: Former Smoker    Packs/day: 2.00    Years: 10.00    Pack years: 20.00    Types: Cigarettes    Last attempt to quit: 04/28/1970    Years since quitting: 47.7  . Smokeless tobacco: Never Used  Substance and Sexual Activity  . Alcohol use: No    Alcohol/week: 0.0 standard drinks    Comment: previous  . Drug use: No  . Sexual activity: Not on file  Lifestyle  . Physical activity:    Days per week: 0 days    Minutes per session: 0 min  . Stress: Not at all  Relationships  . Social connections:    Talks on phone: More than three times a week    Gets together: More than three times a week    Attends religious service: More than 4 times per year    Active member of club or organization: No    Attends meetings of clubs or organizations: Never    Relationship status: Married  Other Topics Concern  . Not on file  Social History Narrative  . Not on file    Outpatient Encounter Medications as of 01/19/2018  Medication Sig  . albuterol (PROVENTIL HFA;VENTOLIN HFA) 108 (90 Base) MCG/ACT inhaler Inhale 1-2 puffs into the lungs every 4 (four) hours as needed for wheezing or shortness of breath.  Marland Kitchen augmented betamethasone dipropionate (DIPROLENE-AF) 0.05 % ointment Apply topically 2 (two) times daily. To affected area as needed up to 1 week  . Cholecalciferol (VITAMIN D3) 2000 UNITS capsule Take 2,000 Units by mouth daily. am  . clopidogrel (PLAVIX) 75 MG tablet Take 1 tablet (75 mg total) by mouth daily. (Patient taking differently: Take 75 mg by  mouth daily. am)  . fexofenadine (ALLEGRA) 180 MG tablet TAKE 1 TABLET BY MOUTH DAILY (Patient taking differently: TAKE 1 TABLET BY MOUTH DAILY/ am)  . fluticasone (FLOVENT HFA) 110 MCG/ACT inhaler Inhale 2 puffs into the lungs 2 (two) times daily.  . folic acid (FOLVITE) 1 MG tablet Take 0.8 mg by mouth daily. am  . glucose blood (ACCU-CHEK AVIVA PLUS) test strip Check blood sugar 1 x daily  . metoprolol tartrate (LOPRESSOR) 50 MG tablet Take 1 tablet (50 mg total) by mouth 2 (two) times daily.  . Multiple Vitamins-Minerals (CENTRUM SILVER 50+MEN  PO) Take by mouth.  . pantoprazole (PROTONIX) 40 MG tablet Take 1 tablet (40 mg total) by mouth 2 (two) times daily. (Patient taking differently: Take 40 mg by mouth 2 (two) times daily. Am and dinner)  . simvastatin (ZOCOR) 40 MG tablet Take 1 tablet (40 mg total) by mouth daily. am  . azithromycin (ZITHROMAX Z-PAK) 250 MG tablet Take 2 tabs (500mg  total) on Day 1. Take 1 tab (250mg ) daily for next 4 days. (Patient not taking: Reported on 01/19/2018)  . benzonatate (TESSALON) 100 MG capsule Take 1 capsule (100 mg total) by mouth 3 (three) times daily as needed for cough. (Patient not taking: Reported on 01/19/2018)  . ipratropium (ATROVENT) 0.06 % nasal spray Place 2 sprays into both nostrils 4 (four) times daily. For up to 5-7 days then stop. (Patient not taking: Reported on 01/19/2018)   No facility-administered encounter medications on file as of 01/19/2018.     Activities of Daily Living In your present state of health, do you have any difficulty performing the following activities: 01/19/2018 07/22/2017  Hearing? Y N  Vision? N N  Difficulty concentrating or making decisions? N N  Walking or climbing stairs? N N  Dressing or bathing? N N  Doing errands, shopping? N -  Preparing Food and eating ? N -  Using the Toilet? N -  In the past six months, have you accidently leaked urine? N -  Do you have problems with loss of bowel control? N -    Managing your Medications? N -  Managing your Finances? N -  Housekeeping or managing your Housekeeping? N -  Some recent data might be hidden    Patient Care Team: Olin Hauser, DO as PCP - General (Family Medicine)   Assessment:   This is a routine wellness examination for [place].  Exercise Activities and Dietary recommendations Current Exercise Habits: Home exercise routine, Type of exercise: walking;strength training/weights, Time (Minutes): 20, Frequency (Times/Week): 3, Weekly Exercise (Minutes/Week): 60, Intensity: Mild, Exercise limited by: None identified  Goals    . DIET - INCREASE WATER INTAKE      recommend drinking at least 6-8 glasses of water a day        Fall Risk Fall Risk  01/19/2018 07/06/2017 12/09/2016 07/01/2016 08/23/2015  Falls in the past year? No No No No No   Is the patient's home free of loose throw rugs in walkways, pet beds, electrical cords, etc?   no      Grab bars in the bathroom? no      Handrails on the stairs?   no stairs       Adequate lighting?   yes  Timed Get Up and Go Performed: Completed in 8 seconds with no use of assistive devices, steady gait. No intervention needed at this time.   Depression Screen PHQ 2/9 Scores 01/19/2018 07/06/2017 12/09/2016 07/01/2016  PHQ - 2 Score 0 0 0 0    Cognitive Function     6CIT Screen 01/19/2018 12/09/2016  What Year? 0 points 0 points  What month? 0 points 0 points  What time? 0 points 0 points  Count back from 20 0 points 0 points  Months in reverse 0 points 0 points  Repeat phrase 2 points 2 points  Total Score 2 2    Immunization History  Administered Date(s) Administered  . Influenza, High Dose Seasonal PF 01/22/2015, 12/31/2017  . Influenza-Unspecified 02/23/2015, 01/03/2017, 12/31/2017  . Pneumococcal Conjugate-13 12/10/2015    Qualifies for  Shingles Vaccine? Yes, discussed shingrix vaccine   Screening Tests Health Maintenance  Topic Date Due  . TETANUS/TDAP  01/27/2023   . INFLUENZA VACCINE  Completed  . PNA vac Low Risk Adult  Completed   Cancer Screenings: Lung: Low Dose CT Chest recommended if Age 81-80 years, 30 pack-year currently smoking OR have quit w/in 15years. Patient does not qualify. Colorectal: completed 04/28/2012  Additional Screenings:  Hepatitis C Screening: not indicated       Plan:    I have personally reviewed and addressed the Medicare Annual Wellness questionnaire and have noted the following in the patient's chart:  A. Medical and social history B. Use of alcohol, tobacco or illicit drugs  C. Current medications and supplements D. Functional ability and status E.  Nutritional status F.  Physical activity G. Advance directives H. List of other physicians I.  Hospitalizations, surgeries, and ER visits in previous 12 months J.  [place] such as hearing and vision if needed, cognitive and depression L. Referrals and appointments   In addition, I have reviewed and discussed with patient certain preventive protocols, quality metrics, and best practice recommendations. A written personalized care plan for preventive services as well as general preventive health recommendations were provided to patient.   Signed,  Tyler Aas, LPN Nurse Health Advisor   Nurse Notes:none

## 2018-01-19 NOTE — Patient Instructions (Signed)
Mr. Keith Cruz , Thank you for taking time to come for your Medicare Wellness Visit. I appreciate your ongoing commitment to your health goals. Please review the following plan we discussed and let me know if I can assist you in the future.   Screening recommendations/referrals: Colonoscopy: completed 04/28/2012 Recommended yearly ophthalmology/optometry visit for glaucoma screening and checkup Recommended yearly dental visit for hygiene and checkup  Vaccinations: Influenza vaccine: completed 12/31/2017 Pneumococcal vaccine: completed series   Tdap vaccine: completed 04/29/2011 Shingles vaccine: shingrix eligible, check with your insurance company for coverage   Advanced directives: Please bring a copy of your health care power of attorney and living will to the office at your convenience.  Conditions/risks identified: recommend drinking at least 6-8 glasses of water a day  Next appointment: Follow up in one year for your annual wellness exam.   Preventive Care 65 Years and Older, Male Preventive care refers to lifestyle choices and visits with your health care provider that can promote health and wellness. What does preventive care include?  A yearly physical exam. This is also called an annual well check.  Dental exams once or twice a year.  Routine eye exams. Ask your health care provider how often you should have your eyes checked.  Personal lifestyle choices, including:  Daily care of your teeth and gums.  Regular physical activity.  Eating a healthy diet.  Avoiding tobacco and drug use.  Limiting alcohol use.  Practicing safe sex.  Taking low doses of aspirin every day.  Taking vitamin and mineral supplements as recommended by your health care provider. What happens during an annual well check? The services and screenings done by your health care provider during your annual well check will depend on your age, overall health, lifestyle risk factors, and family history of  disease. Counseling  Your health care provider may ask you questions about your:  Alcohol use.  Tobacco use.  Drug use.  Emotional well-being.  Home and relationship well-being.  Sexual activity.  Eating habits.  History of falls.  Memory and ability to understand (cognition).  Work and work Statistician. Screening  You may have the following tests or measurements:  Height, weight, and BMI.  Blood pressure.  Lipid and cholesterol levels. These may be checked every 5 years, or more frequently if you are over 83 years old.  Skin check.  Lung cancer screening. You may have this screening every year starting at age 54 if you have a 30-pack-year history of smoking and currently smoke or have quit within the past 15 years.  Fecal occult blood test (FOBT) of the stool. You may have this test every year starting at age 46.  Flexible sigmoidoscopy or colonoscopy. You may have a sigmoidoscopy every 5 years or a colonoscopy every 10 years starting at age 31.  Prostate cancer screening. Recommendations will vary depending on your family history and other risks.  Hepatitis C blood test.  Hepatitis B blood test.  Sexually transmitted disease (STD) testing.  Diabetes screening. This is done by checking your blood sugar (glucose) after you have not eaten for a while (fasting). You may have this done every 1-3 years.  Abdominal aortic aneurysm (AAA) screening. You may need this if you are a current or former smoker.  Osteoporosis. You may be screened starting at age 79 if you are at high risk. Talk with your health care provider about your test results, treatment options, and if necessary, the need for more tests. Vaccines  Your health care  provider may recommend certain vaccines, such as:  Influenza vaccine. This is recommended every year.  Tetanus, diphtheria, and acellular pertussis (Tdap, Td) vaccine. You may need a Td booster every 10 years.  Zoster vaccine. You may  need this after age 60.  Pneumococcal 13-valent conjugate (PCV13) vaccine. One dose is recommended after age 1.  Pneumococcal polysaccharide (PPSV23) vaccine. One dose is recommended after age 37. Talk to your health care provider about which screenings and vaccines you need and how often you need them. This information is not intended to replace advice given to you by your health care provider. Make sure you discuss any questions you have with your health care provider. Document Released: 05/11/2015 Document Revised: 01/02/2016 Document Reviewed: 02/13/2015 Elsevier Interactive Patient Education  2017 [place] Prevention in the Home Falls can cause injuries. They can happen to people of all ages. There are many things you can do to make your home safe and to help prevent falls. What can I do on the outside of my home?  Regularly fix the edges of walkways and driveways and fix any cracks.  Remove anything that might make you trip as you walk through a door, such as a raised step or threshold.  Trim any bushes or trees on the path to your home.  Use bright outdoor lighting.  Clear any walking paths of anything that might make someone trip, such as rocks or tools.  Regularly check to see if handrails are loose or broken. Make sure that both sides of any steps have handrails.  Any raised decks and porches should have guardrails on the edges.  Have any leaves, snow, or ice cleared regularly.  Use sand or salt on walking paths during winter.  Clean up any spills in your garage right away. This includes oil or grease spills. What can I do in the bathroom?  Use night lights.  Install grab bars by the toilet and in the tub and shower. Do not use towel bars as grab bars.  Use non-skid mats or decals in the tub or shower.  If you need to sit down in the shower, use a plastic, non-slip stool.  Keep the floor dry. Clean up any water that spills on the floor as soon as it  happens.  Remove soap buildup in the tub or shower regularly.  Attach bath mats securely with double-sided non-slip rug tape.  Do not have throw rugs and other things on the floor that can make you trip. What can I do in the bedroom?  Use night lights.  Make sure that you have a light by your bed that is easy to reach.  Do not use any sheets or blankets that are too big for your bed. They should not hang down onto the floor.  Have a firm chair that has side arms. You can use this for support while you get dressed.  Do not have throw rugs and other things on the floor that can make you trip. What can I do in the kitchen?  Clean up any spills right away.  Avoid walking on wet floors.  Keep items that you use a lot in easy-to-reach places.  If you need to reach something above you, use a strong step stool that has a grab bar.  Keep electrical cords out of the way.  Do not use floor polish or wax that makes floors slippery. If you must use wax, use non-skid floor wax.  Do not have throw  rugs and other things on the floor that can make you trip. What can I do with my stairs?  Do not leave any items on the stairs.  Make sure that there are handrails on both sides of the stairs and use them. Fix handrails that are broken or loose. Make sure that handrails are as long as the stairways.  Check any carpeting to make sure that it is firmly attached to the stairs. Fix any carpet that is loose or worn.  Avoid having throw rugs at the top or bottom of the stairs. If you do have throw rugs, attach them to the floor with carpet tape.  Make sure that you have a light switch at the top of the stairs and the bottom of the stairs. If you do not have them, ask someone to add them for you. What else can I do to help prevent falls?  Wear shoes that:  Do not have high heels.  Have rubber bottoms.  Are comfortable and fit you well.  Are closed at the toe. Do not wear sandals.  If you  use a stepladder:  Make sure that it is fully opened. Do not climb a closed stepladder.  Make sure that both sides of the stepladder are locked into place.  Ask someone to hold it for you, if possible.  Clearly mark and make sure that you can see:  Any grab bars or handrails.  First and last steps.  Where the edge of each step is.  Use tools that help you move around (mobility aids) if they are needed. These include:  Canes.  Walkers.  Scooters.  Crutches.  Turn on the lights when you go into a dark area. Replace any light bulbs as soon as they burn out.  Set up your furniture so you have a clear path. Avoid moving your furniture around.  If any of your floors are uneven, fix them.  If there are any pets around you, be aware of where they are.  Review your medicines with your doctor. Some medicines can make you feel dizzy. This can increase your chance of falling. Ask your doctor what other things that you can do to help prevent falls. This information is not intended to replace advice given to you by your health care provider. Make sure you discuss any questions you have with your health care provider. Document Released: 02/08/2009 Document Revised: 09/20/2015 Document Reviewed: 05/19/2014 Elsevier Interactive Patient Education  2017 [employer].

## 2018-01-19 NOTE — Assessment & Plan Note (Signed)
Stable PreDM A1c 6.1 today, from prior 6.0 to 6.2 Concern with CKD-III, HTN, HLD  Plan:  1. Not on any therapy currently - continue lifestyle. Encouraged to improve low carb, low sugar diet, reduce portion size, continue improving regular exercise 2. Follow-up in 6 months for A1c w/ labs annual

## 2018-01-23 ENCOUNTER — Other Ambulatory Visit: Payer: Self-pay | Admitting: Family Medicine

## 2018-01-23 DIAGNOSIS — J4530 Mild persistent asthma, uncomplicated: Secondary | ICD-10-CM

## 2018-03-02 DIAGNOSIS — H35372 Puckering of macula, left eye: Secondary | ICD-10-CM | POA: Diagnosis not present

## 2018-03-09 ENCOUNTER — Encounter: Payer: Self-pay | Admitting: Family Medicine

## 2018-03-09 ENCOUNTER — Ambulatory Visit (INDEPENDENT_AMBULATORY_CARE_PROVIDER_SITE_OTHER): Payer: Medicare Other | Admitting: Family Medicine

## 2018-03-09 VITALS — BP 140/61 | HR 70 | Temp 98.1°F | Resp 16 | Ht 76.0 in | Wt 220.0 lb

## 2018-03-09 DIAGNOSIS — J441 Chronic obstructive pulmonary disease with (acute) exacerbation: Secondary | ICD-10-CM

## 2018-03-09 DIAGNOSIS — J4489 Other specified chronic obstructive pulmonary disease: Secondary | ICD-10-CM

## 2018-03-09 DIAGNOSIS — J449 Chronic obstructive pulmonary disease, unspecified: Secondary | ICD-10-CM

## 2018-03-09 MED ORDER — LEVOFLOXACIN 500 MG PO TABS: 500.0000 mg | ORAL_TABLET | Freq: Every day | ORAL | 0 refills | Status: DC

## 2018-03-09 MED ORDER — IPRATROPIUM-ALBUTEROL 0.5-2.5 (3) MG/3ML IN SOLN: 3.0000 mL | Freq: Once | RESPIRATORY_TRACT | Status: AC

## 2018-03-09 MED ORDER — UMECLIDINIUM-VILANTEROL 62.5-25 MCG/INH IN AEPB: 1.0000 | INHALATION_SPRAY | Freq: Every day | RESPIRATORY_TRACT | 0 refills | Status: DC

## 2018-03-09 MED ORDER — IPRATROPIUM BROMIDE 0.06 % NA SOLN: 2.0000 | Freq: Four times a day (QID) | NASAL | 0 refills | Status: DC

## 2018-03-09 MED ORDER — BENZONATATE 100 MG PO CAPS: 100.0000 mg | ORAL_CAPSULE | Freq: Three times a day (TID) | ORAL | 0 refills | Status: DC | PRN

## 2018-03-09 NOTE — Patient Instructions (Addendum)
Thank you for coming to the office today.  Referral to Dr Raul Del [facility] Pulmonology for 2nd opinion, may do lung function testing and determine long term maintenance therapy to reduce flare ups  For today  1. It sounds like you had an Upper Respiratory Virus that has settled into a Bronchitis, lower respiratory tract infection. I don't have concerns for pneumonia today, and think that this should gradually improve. Once you are feeling better, the cough may take a few weeks to fully resolve. I do hear wheezing and coarse breath sounds, likely from COPD vs Asthma  Start taking Levaquin antibiotic 500mg  daily x 7 days  Duoneb treatment today in office  - Use Albuterol inhaler 2 puffs every 4-6 hours around the clock for next 2-3 days, max up to 5 days then use as needed   STOP FLOVENT - Start Anoro Inhaler one puff daily sample - when run out then switch back to Flovent until you see Dr Raul Del  Start Atrovent nasal spray decongestant 2 sprays in each nostril up to 4 times daily for 7 days  Start Tessalon Perls take 1 capsule up to 3 times a day as needed for cough  - Use nasal saline (Simply Saline or Ocean Spray) to flush nasal congestion multiple times a day, may help cough - Drink plenty of fluids to improve congestion  If your symptoms seem to worsen instead of improve over next several days, including significant fever / chills, worsening shortness of breath, worsening wheezing, or nausea / vomiting and can't take medicines - return sooner or go to hospital Emergency Department for more immediate treatment.  Please schedule a Follow-up Appointment to: Return in about 2 weeks (around 03/23/2018), or if symptoms worsen or fail to improve, for COPD / asthma.  If you have any other questions or concerns, please feel free to call the office or send a message through [place]. You may also schedule an earlier appointment if necessary.  Additionally, you may be receiving a survey  about your experience at our office within a few days to 1 week by e-mail or mail. We value your feedback.  Nobie Putnam, DO [place]

## 2018-03-09 NOTE — Progress Notes (Signed)
Subjective:    Patient ID: Keith Cruz, male    DOB: 12/17/1940, 77 y.o.   MRN: [id number]  Keith Cruz is a 77 y.o. male presenting on 03/09/2018 for Nasal Congestion (chest congestion, cough greenish mucus, chills denies fever, HA onset 3 days earache)  Patient presents for a same day appointment. Accompanied by significant other, [name], for additional history  HPI  COPD EXACERBATION Reports symptoms started with sore throat and ear pain and pressure L>R, with progression and worsening to thicker green drainage and sputum - Admits productive cough worse in AM and again in afternoon - Admits chills, and some warm temperature - Denies any known sick contacts - Recent course back in 07/2017, treated for rhinosinusitis URI, did not improve, sent Augmentin limited benefit, and then later 2 months tried on Azithromycin with recurrent episode. Ultimately he and his wife report that he never fully resolves with the productive cough, sometimes it can get clear but often when flare up it can get thicker or yellow - He continues to use Flovent 2 puffs twice a day, with some relief, but not complete relief - Used Albuterol Inhaler as needed with good results recently Admits ear pain and pressure - Denies chest pain, dizziness, sinus pain  Health Maintenance: UTD Flu 12/31/17  Depression screen [facility] 2/9 03/09/2018 01/19/2018 07/06/2017  Decreased Interest 0 0 0  Down, Depressed, Hopeless 0 0 0  PHQ - 2 Score 0 0 0    Social History   Tobacco Use  . Smoking status: Former Smoker    Packs/day: 2.00    Years: 10.00    Pack years: 20.00    Types: Cigarettes    Last attempt to quit: 04/28/1970    Years since quitting: 47.8  . Smokeless tobacco: Former Network engineer Use Topics  . Alcohol use: No    Alcohol/week: 0.0 standard drinks    Comment: previous  . Drug use: No    Review of Systems Per HPI unless specifically indicated above     Objective:    BP 140/61   Pulse 70    Temp 98.1 F (36.7 C) (Oral)   Resp 16   Ht 6\' 4"  (1.93 m)   Wt 220 lb (99.8 kg)   SpO2 96%   BMI 26.78 kg/m   Wt Readings from Last 3 Encounters:  03/09/18 220 lb (99.8 kg)  01/19/18 219 lb 6.4 oz (99.5 kg)  01/19/18 219 lb 6.4 oz (99.5 kg)    Physical Exam  Constitutional: He is oriented to person, place, and time. He appears well-developed and well-nourished. No distress.  Mostly well appearing, comfortable, cooperative  HENT:  Head: Normocephalic and atraumatic.  Mouth/Throat: Oropharynx is clear and moist.  Frontal / sinuses mild -tender. Nares patent without purulence or edema. L Tm has some chronic scar tissue, some clear effusion bilateral, no erythema Oropharynx clear without erythema, exudates, edema or asymmetry.  Eyes: Conjunctivae are normal. Right eye exhibits no discharge. Left eye exhibits no discharge.  Neck: Normal range of motion. Neck supple. No thyromegaly present.  Cardiovascular: Normal rate, regular rhythm, normal heart sounds and intact distal pulses.  No murmur heard. Pulmonary/Chest: Effort normal and breath sounds normal. No respiratory distress. He has no wheezes. He has no rales.  Pre-Nebulizer Treatment Mild diffuse reduced air movement with tight exp wheezing, no focal crackles but some coarse breath sounds.  Post-Nebulizer (Duoneb x 1) Treatment Slight improvement in air movement overall, reduced coarse sounds, some mild wheezing  without focal abnormality.   Musculoskeletal: Normal range of motion. He exhibits no edema.  Lymphadenopathy:    He has no cervical adenopathy.  Neurological: He is alert and oriented to person, place, and time.  Skin: Skin is warm and dry. No rash noted. He is not diaphoretic. No erythema.  Psychiatric: He has a normal mood and affect. His behavior is normal.  Well groomed, good eye contact, normal speech and thoughts  Nursing note and vitals reviewed.  Results for orders placed or performed in visit on 01/19/18    CBC and differential  Result Value Ref Range   Hemoglobin 14.8 13.5 - 17.5   HCT 43 41 - 53   Platelets 260 150 - 399   WBC 7.7   Basic metabolic panel  Result Value Ref Range   Glucose 97    BUN 22 (A) 4 - 21   Creatinine 1.5 (A) 0.6 - 1.3   Potassium 4.7 3.4 - 5.3   Sodium 140 137 - 147  PTH, intact  Result Value Ref Range   PTH, Intact 37 14 - 64  POCT HgB A1C  Result Value Ref Range   Hemoglobin A1C 6.1 (A) 4.0 - 5.6 %      Assessment & Plan:   Problem List Items Addressed This Visit    None    Visit Diagnoses    Acute exacerbation of chronic obstructive pulmonary disease (COPD) (HCC)    -  Primary   Relevant Medications   ipratropium (ATROVENT) 0.06 % nasal spray   umeclidinium-vilanterol (ANORO ELLIPTA) 62.5-25 MCG/INH AEPB   levofloxacin (LEVAQUIN) 500 MG tablet   benzonatate (TESSALON) 100 MG capsule   ipratropium-albuterol (DUONEB) 0.5-2.5 (3) MG/3ML nebulizer solution 3 mL   Other Relevant Orders   Ambulatory referral to Pulmonology   COPD with asthma (HCC)       Relevant Medications   ipratropium (ATROVENT) 0.06 % nasal spray   umeclidinium-vilanterol (ANORO ELLIPTA) 62.5-25 MCG/INH AEPB   benzonatate (TESSALON) 100 MG capsule   ipratropium-albuterol (DUONEB) 0.5-2.5 (3) MG/3ML nebulizer solution 3 mL   Other Relevant Orders   Ambulatory referral to Pulmonology      Consistent with mild acute exacerbation of COPD with worsening productive cough. Similar to prior exacerbation, 07/2017, he has had recurrent bronchitis cough intermittently since previous flare, never fully resolved.. - No hypoxia (96% on RA), afebrile, no recent hospitalization Not seem Pulm in past Allergy to Prednisone  Plan: Duoneb x 1 in office - mild relief 1. Start taking Levaquin antibiotic 500mg  daily x 7 days 2. Use albuterol q 4 hr regularly x 2-3 days - Stop Flovent and start new Anoro 1 puff daily for maintenance therapy - to reduce exacerbations - given sample today of  Anoro, demo given, will send rx when ready - Referral to [facility] Pulmonology Dr Raul Del for 2nd opinion COPD evaluation, may need PFTs 4. RTC about 1 week if not improving, otherwise strict return criteria to go to ED    Meds ordered this encounter  Medications  . ipratropium (ATROVENT) 0.06 % nasal spray    Sig: Place 2 sprays into both nostrils 4 (four) times daily. For up to 5-7 days then stop.    Dispense:  15 mL    Refill:  0  . umeclidinium-vilanterol (ANORO ELLIPTA) 62.5-25 MCG/INH AEPB    Sig: Inhale 1 puff into the lungs daily.    Dispense:  1 each    Refill:  0  . levofloxacin (LEVAQUIN) 500 MG tablet  Sig: Take 1 tablet (500 mg total) by mouth daily. For 7 days    Dispense:  7 tablet    Refill:  0  . benzonatate (TESSALON) 100 MG capsule    Sig: Take 1 capsule (100 mg total) by mouth 3 (three) times daily as needed for cough.    Dispense:  30 capsule    Refill:  0  . ipratropium-albuterol (DUONEB) 0.5-2.5 (3) MG/3ML nebulizer solution 3 mL      Follow up plan: Return in about 2 weeks (around 03/23/2018), or if symptoms worsen or fail to improve, for COPD / asthma.  Nobie Putnam, DO [facility] 03/09/2018, 2:10 PM

## 2018-03-12 ENCOUNTER — Telehealth: Payer: Self-pay | Admitting: Family Medicine

## 2018-03-12 NOTE — Telephone Encounter (Signed)
Late documentation - Patient called earlier today report some weakness and shaking, recently treated for COPD exacerbation see last office visit, triaged and was advised that some of his shaking or tremor may be from albuterol breathing treatments, but if he was having significant acute weakness or other worsening concerns / dyspnea then he should seek more immediate care at hospital ED, given he was already treated aggressively for COPD and referred to [facility] and limited other options available on Friday afternoon, we did not have any open appointments. He was given return precautions if not improving.  Nobie Putnam, DO [facility] 03/12/2018, 10:01 PM

## 2018-03-15 DIAGNOSIS — J449 Chronic obstructive pulmonary disease, unspecified: Secondary | ICD-10-CM | POA: Diagnosis not present

## 2018-03-15 DIAGNOSIS — R0609 Other forms of dyspnea: Secondary | ICD-10-CM | POA: Diagnosis not present

## 2018-03-15 DIAGNOSIS — J310 Chronic rhinitis: Secondary | ICD-10-CM | POA: Diagnosis not present

## 2018-03-15 DIAGNOSIS — K219 Gastro-esophageal reflux disease without esophagitis: Secondary | ICD-10-CM | POA: Diagnosis not present

## 2018-03-15 DIAGNOSIS — R05 Cough: Secondary | ICD-10-CM | POA: Diagnosis not present

## 2018-03-19 DIAGNOSIS — I2510 Atherosclerotic heart disease of native coronary artery without angina pectoris: Secondary | ICD-10-CM | POA: Diagnosis not present

## 2018-03-19 DIAGNOSIS — E782 Mixed hyperlipidemia: Secondary | ICD-10-CM | POA: Diagnosis not present

## 2018-03-19 DIAGNOSIS — I10 Essential (primary) hypertension: Secondary | ICD-10-CM | POA: Diagnosis not present

## 2018-04-01 ENCOUNTER — Other Ambulatory Visit: Payer: Self-pay | Admitting: Family Medicine

## 2018-04-01 DIAGNOSIS — J441 Chronic obstructive pulmonary disease with (acute) exacerbation: Secondary | ICD-10-CM

## 2018-04-02 ENCOUNTER — Other Ambulatory Visit: Payer: Self-pay

## 2018-04-12 DIAGNOSIS — L570 Actinic keratosis: Secondary | ICD-10-CM | POA: Diagnosis not present

## 2018-04-14 DIAGNOSIS — R110 Nausea: Secondary | ICD-10-CM | POA: Diagnosis not present

## 2018-04-14 DIAGNOSIS — R509 Fever, unspecified: Secondary | ICD-10-CM | POA: Diagnosis not present

## 2018-04-14 DIAGNOSIS — J3489 Other specified disorders of nose and nasal sinuses: Secondary | ICD-10-CM | POA: Diagnosis not present

## 2018-05-11 ENCOUNTER — Ambulatory Visit (INDEPENDENT_AMBULATORY_CARE_PROVIDER_SITE_OTHER): Payer: Medicare Other | Admitting: Internal Medicine

## 2018-05-11 ENCOUNTER — Telehealth: Payer: Self-pay | Admitting: Internal Medicine

## 2018-05-11 ENCOUNTER — Encounter: Payer: Self-pay | Admitting: Internal Medicine

## 2018-05-11 ENCOUNTER — Ambulatory Visit
Admission: RE | Admit: 2018-05-11 | Discharge: 2018-05-11 | Disposition: A | Discharge status: Home / Self Care | Payer: Medicare Other | Source: Ambulatory Visit | Attending: Internal Medicine | Admitting: Internal Medicine

## 2018-05-11 VITALS — BP 112/62 | HR 72 | Temp 97.4°F | Ht 76.0 in | Wt 219.8 lb

## 2018-05-11 DIAGNOSIS — R42 Dizziness and giddiness: Secondary | ICD-10-CM | POA: Insufficient documentation

## 2018-05-11 DIAGNOSIS — H814 Vertigo of central origin: Secondary | ICD-10-CM

## 2018-05-11 DIAGNOSIS — I2510 Atherosclerotic heart disease of native coronary artery without angina pectoris: Secondary | ICD-10-CM

## 2018-05-11 DIAGNOSIS — E559 Vitamin D deficiency, unspecified: Secondary | ICD-10-CM

## 2018-05-11 DIAGNOSIS — Z23 Encounter for immunization: Secondary | ICD-10-CM

## 2018-05-11 DIAGNOSIS — Z1329 Encounter for screening for other suspected endocrine disorder: Secondary | ICD-10-CM

## 2018-05-11 DIAGNOSIS — K861 Other chronic pancreatitis: Secondary | ICD-10-CM

## 2018-05-11 DIAGNOSIS — Z1389 Encounter for screening for other disorder: Secondary | ICD-10-CM

## 2018-05-11 DIAGNOSIS — Z0184 Encounter for antibody response examination: Secondary | ICD-10-CM

## 2018-05-11 DIAGNOSIS — R918 Other nonspecific abnormal finding of lung field: Secondary | ICD-10-CM | POA: Diagnosis not present

## 2018-05-11 DIAGNOSIS — Z87891 Personal history of nicotine dependence: Secondary | ICD-10-CM

## 2018-05-11 DIAGNOSIS — N183 Chronic kidney disease, stage 3 unspecified: Secondary | ICD-10-CM

## 2018-05-11 DIAGNOSIS — Z1159 Encounter for screening for other viral diseases: Secondary | ICD-10-CM

## 2018-05-11 DIAGNOSIS — J410 Simple chronic bronchitis: Secondary | ICD-10-CM

## 2018-05-11 DIAGNOSIS — Z951 Presence of aortocoronary bypass graft: Secondary | ICD-10-CM

## 2018-05-11 DIAGNOSIS — E782 Mixed hyperlipidemia: Secondary | ICD-10-CM

## 2018-05-11 DIAGNOSIS — Z8673 Personal history of transient ischemic attack (TIA), and cerebral infarction without residual deficits: Secondary | ICD-10-CM

## 2018-05-11 DIAGNOSIS — I701 Atherosclerosis of renal artery: Secondary | ICD-10-CM

## 2018-05-11 DIAGNOSIS — R7303 Prediabetes: Secondary | ICD-10-CM

## 2018-05-11 DIAGNOSIS — Z125 Encounter for screening for malignant neoplasm of prostate: Secondary | ICD-10-CM

## 2018-05-11 DIAGNOSIS — Z0000 Encounter for general adult medical examination without abnormal findings: Secondary | ICD-10-CM

## 2018-05-11 DIAGNOSIS — J4530 Mild persistent asthma, uncomplicated: Secondary | ICD-10-CM

## 2018-05-11 DIAGNOSIS — I6523 Occlusion and stenosis of bilateral carotid arteries: Secondary | ICD-10-CM

## 2018-05-11 NOTE — Progress Notes (Signed)
Chief Complaint  Patient presents with  . Transitions Of Care   1. Asthma with chronic bronchitis f/u with pulm Dr. Raul Del. H/o ground glass noted on CT chest needs repeat  Recently tx'ed with levaquin 04/14/18 with pulm 2. CAD s/p CABG x 3-5 vessel repair in 2017/HLD, b/l carotid artery stenosis s/l CEA in 1991 then 2014 Dr. Lucky Cowboy, s/o right renal artery stent. Echo 08/2016 with trivial regurgitation  On statin and plavix f/u vascular Dr. Lucky Cowboy and cardiology Dr. Raliegh Ip  3. CKD 3 f/u with Dr. Candiss Norse 4. H/o stroke and seizure x 1 remotely  5. Dizziness x 2-4 weeks r/o new stroke with h/o stroke     Review of Systems  Constitutional: Negative for weight loss.  HENT: Negative for hearing loss.   Eyes: Negative for blurred vision.  Respiratory: Negative for shortness of breath.   Cardiovascular: Negative for chest pain.  Gastrointestinal: Negative for abdominal pain.  Musculoskeletal: Negative for falls.  Skin: Negative for rash.  Neurological: Positive for dizziness. Negative for headaches.  Psychiatric/Behavioral: Negative for depression and memory loss.   Past Medical History:  Diagnosis Date  . Arthritis    back  . Asthma   . Atherosclerosis of aorta ([employer])   . Atherosclerosis of aorta ([place])   . Bronchitis 05/2017   chronic  . CAD (coronary artery disease)   . Carotid artery disease ([place])   . Carotid stenosis   . Carotid stenosis, bilateral   . Chronic bronchitis ([place])   . Chronic cough   . COPD (chronic obstructive pulmonary disease) ([place])   . Dysrhythmia   . GERD (gastroesophageal reflux disease)   . Heart murmur   . HOH (hard of hearing)    hearing aids  . Hypercholesteremia   . Hypertension   . Kidney disease    stent  . Macular degeneration of left eye   . Myocardial infarction ([place]) 2016  . Pre-diabetes   . Seasonal allergies   . Seizure ([place])   . Seizures ([facility])    viral meningitis  . Stroke [facility])    1991 and imaging 05/12/15 old strokes  . TIA (transient  ischemic attack) 1991   x2  . Uses hearing aid   . Viral meningitis 2017  . Viral meningitis   . Wears dentures    upper and lower   Past Surgical History:  Procedure Laterality Date  . CAROTID ENDARTERECTOMY Left 2014  . CAROTID ENDARTERECTOMY Right 1991  . CATARACT EXTRACTION W/PHACO Left 06/24/2017   Procedure: CATARACT EXTRACTION PHACO AND INTRAOCULAR LENS PLACEMENT ([place]) LEFT BORDERLINE DIABETIC;  Surgeon: Leandrew Koyanagi, MD;  Location: [place];  Service: Ophthalmology;  Laterality: Left;  . CATARACT EXTRACTION W/PHACO Right 07/22/2017   Procedure: CATARACT EXTRACTION PHACO AND INTRAOCULAR LENS PLACEMENT ([place]) RIGHT BORDERLINE DIABETIC;  Surgeon: Leandrew Koyanagi, MD;  Location: [place];  Service: Ophthalmology;  Laterality: Right;  . CORONARY ARTERY BYPASS GRAFT     x5  . SKIN LESION EXCISION Left    left ear  . URETERAL STENT PLACEMENT     Family History  Problem Relation Age of Onset  . Stroke Mother   . Heart attack Mother   . Stroke Father   . Stroke Paternal Grandmother   . Stroke Paternal Grandfather   . Alzheimer's disease Paternal Grandfather   . Stroke Sister   . Alzheimer's disease Sister   . Stroke Brother   . Alzheimer's disease Maternal Grandmother    Social History   Socioeconomic History  .  Marital status: Married    Spouse name: Not on file  . Number of children: Not on file  . Years of education: Not on file  . Highest education level: 10th grade  Occupational History  . Not on file  Social Needs  . Financial resource strain: Not hard at all  . Food insecurity:    Worry: Never true    Inability: Never true  . Transportation needs:    Medical: No    Non-medical: No  Tobacco Use  . Smoking status: Former Smoker    Packs/day: 2.00    Years: 10.00    Pack years: 20.00    Types: Cigarettes    Last attempt to quit: 04/28/1970    Years since quitting: 48.0  . Smokeless tobacco: Former Network engineer and  Sexual Activity  . Alcohol use: No    Alcohol/week: 0.0 standard drinks    Comment: previous  . Drug use: No  . Sexual activity: Yes    Comment: wife   Lifestyle  . Physical activity:    Days per week: 0 days    Minutes per session: 0 min  . Stress: Not at all  Relationships  . Social connections:    Talks on phone: More than three times a week    Gets together: More than three times a week    Attends religious service: More than 4 times per year    Active member of club or organization: No    Attends meetings of clubs or organizations: Never    Relationship status: Married  . Intimate partner violence:    Fear of current or ex partner: No    Emotionally abused: No    Physically abused: No    Forced sexual activity: No  Other Topics Concern  . Not on file  Social History Narrative   Re-married x 3 years    Retired    Owns guns, wears seat belt, safe in relationship    2 sons and 1 daughter    Current Meds  Medication Sig  . albuterol (PROVENTIL HFA;VENTOLIN HFA) 108 (90 Base) MCG/ACT inhaler Inhale 1-2 puffs into the lungs every 4 (four) hours as needed for wheezing or shortness of breath.  . Cholecalciferol (VITAMIN D3) 2000 UNITS capsule Take 2,000 Units by mouth daily. am  . clopidogrel (PLAVIX) 75 MG tablet Take 1 tablet (75 mg total) by mouth daily. (Patient taking differently: Take 75 mg by mouth daily. am)  . fexofenadine (ALLEGRA) 180 MG tablet TAKE 1 TABLET BY MOUTH DAILY (Patient taking differently: TAKE 1 TABLET BY MOUTH DAILY/ am)  . FLOVENT HFA 110 MCG/ACT inhaler TAKE 2 PUFFS BY MOUTH TWICE A DAY  . folic acid (FOLVITE) 1 MG tablet Take 0.8 mg by mouth daily. am  . glucose blood (ACCU-CHEK AVIVA PLUS) test strip Check blood sugar 1 x daily  . metoprolol tartrate (LOPRESSOR) 50 MG tablet Take 1 tablet (50 mg total) by mouth 2 (two) times daily.  . Multiple Vitamins-Minerals (CENTRUM SILVER 50+MEN PO) Take by mouth.  . pantoprazole (PROTONIX) 40 MG tablet Take  1 tablet (40 mg total) by mouth 2 (two) times daily. (Patient taking differently: Take 40 mg by mouth 2 (two) times daily. Am and dinner)  . simvastatin (ZOCOR) 40 MG tablet Take 1 tablet (40 mg total) by mouth daily. am  . [DISCONTINUED] augmented betamethasone dipropionate (DIPROLENE-AF) 0.05 % ointment Apply topically 2 (two) times daily. To affected area as needed up to 1 week  . [  DISCONTINUED] benzonatate (TESSALON) 100 MG capsule Take 1 capsule (100 mg total) by mouth 3 (three) times daily as needed for cough.  . [DISCONTINUED] cyclobenzaprine (FLEXERIL) 10 MG tablet Take 0.5-1 tablets (5-10 mg total) by mouth 3 (three) times daily as needed for muscle spasms.  . [DISCONTINUED] ipratropium (ATROVENT) 0.06 % nasal spray PLACE 2 SPRAYS INTO BOTH NOSTRILS 4 (FOUR) TIMES DAILY. FOR UP TO 5-7 DAYS THEN STOP.  . [DISCONTINUED] levofloxacin (LEVAQUIN) 500 MG tablet Take 1 tablet (500 mg total) by mouth daily. For 7 days  . [DISCONTINUED] umeclidinium-vilanterol (ANORO ELLIPTA) 62.5-25 MCG/INH AEPB Inhale 1 puff into the lungs daily.   Current Facility-Administered Medications for the 05/11/18 encounter (Office Visit) with McLean-Scocuzza, Nino Glow, MD  Medication  . ipratropium-albuterol (DUONEB) 0.5-2.5 (3) MG/3ML nebulizer solution 3 mL   Allergies  Allergen Reactions  . Fexofenadine-Pseudoephed Er Swelling    Tongue and face. Only with D product. Tolerates plain allegra at home.  .   . Nifedipine Swelling    Tongue and face    . Allegra  [Fexofenadine] Swelling    Tongue and face. Only with D product. Tolerates plain allegra at home.   Jearl Klinefelter Ellipta [Umeclidinium-Vilanterol]   . Prednisone Other (See Comments)    Oral prednisone. Confusion. Mental status changes   No results found for this or any previous visit (from the past 2160 hour(s)). Objective  Body mass index is 26.75 kg/m. Wt Readings from Last 3 Encounters:  05/11/18 219 lb 12.8 oz (99.7 kg)  03/09/18 220 lb (99.8 kg)   01/19/18 219 lb 6.4 oz (99.5 kg)   Temp Readings from Last 3 Encounters:  05/11/18 (!) 97.4 F (36.3 C) (Oral)  03/09/18 98.1 F (36.7 C) (Oral)  01/19/18 (!) 97.5 F (36.4 C) (Oral)   BP Readings from Last 3 Encounters:  05/11/18 112/62  03/09/18 140/61  01/19/18 132/74   Pulse Readings from Last 3 Encounters:  05/11/18 72  03/09/18 70  01/19/18 86    Physical Exam Vitals signs and nursing note reviewed.  Constitutional:      Appearance: Normal appearance. He is well-developed and overweight.  HENT:     Head: Normocephalic and atraumatic.     Nose: Nose normal.     Mouth/Throat:     Mouth: Mucous membranes are moist.     Pharynx: Oropharynx is clear.  Eyes:     Conjunctiva/sclera: Conjunctivae normal.     Pupils: Pupils are equal, round, and reactive to light.  Cardiovascular:     Rate and Rhythm: Normal rate and regular rhythm.     Heart sounds: Normal heart sounds.  Pulmonary:     Effort: Pulmonary effort is normal.     Breath sounds: Normal breath sounds.  Skin:    General: Skin is warm and dry.  Neurological:     General: No focal deficit present.     Mental Status: He is alert and oriented to person, place, and time.     Gait: Gait normal.  Psychiatric:        Attention and Perception: Attention and perception normal.        Mood and Affect: Mood and affect normal.        Speech: Speech normal.        Behavior: Behavior normal. Behavior is cooperative.        Thought Content: Thought content normal.        Cognition and Memory: Cognition and memory normal.  Judgment: Judgment normal.     Assessment   1. Asthma with chronic bronchitis f/u with pulm Dr. Raul Del. H/o ground glass  Recently tx'ed with levaquin 04/14/18  2. CAD s/p CABG x 3-5 vessel repair in 2017/HLD, b/l carotid artery stenosis s/l CEA in 1991 then 2014 Dr. Lucky Cowboy, s/o right renal artery stent. Echo 08/2016 with trivial regurgitation  3. CKD 3  4. H/o stroke and seizure x 1  5.  Dizziness x 2-4 weeks r/o new stroke  6. HM  Plan   1. Couldn't tolerate anoro, flovent and prn albuterol help  F/u Dr. Raul Del  Repeat CT chest today +asbestos exposure  2. Check fasting labs  F/u cards and vascular  3. F/u with Dr. Candiss Norse q6 months  4. Monitor and control risk factors  5. CT head stat +left sinusitis tx with Augmentin 6.  Flu shot had 12/31/17  pna 23 given today  prevnar utd  Consider Tdap and shingrix in future if not had   Hep C neg 10/07/13  Former smoker since age 64 y.o to 20 max 2ppd  Consider cologuard in future no FH colon cancer ? Date last colonoscopy cant see  -disc at f/u  PSA  Check upcoming labs   Cards-Dr K  [facility] [place] [facility]  [facility] Dr. Lucky Cowboy   Provider: Dr. Olivia Mackie McLean-Scocuzza-Internal Medicine

## 2018-05-11 NOTE — Patient Instructions (Addendum)
Dizziness Dizziness is a common problem. It is a feeling of unsteadiness or light-headedness. You may feel like you are about to faint. Dizziness can lead to injury if you stumble or fall. Anyone can become dizzy, but dizziness is more common in older adults. This condition can be caused by a number of things, including medicines, dehydration, or illness. Follow these instructions at home: Eating and drinking  Drink enough fluid to keep your urine clear or pale yellow. This helps to keep you from becoming dehydrated. Try to drink more clear fluids, such as water.  Do not drink alcohol.  Limit your caffeine intake if told to do so by your health care provider. Check ingredients and nutrition facts to see if a food or beverage contains caffeine.  Limit your salt (sodium) intake if told to do so by your health care provider. Check ingredients and nutrition facts to see if a food or beverage contains sodium. Activity  Avoid making quick movements. ? Rise slowly from chairs and steady yourself until you feel okay. ? In the morning, first sit up on the side of the bed. When you feel okay, stand slowly while you hold onto something until you know that your balance is fine.  If you need to stand in one place for a long time, move your legs often. Tighten and relax the muscles in your legs while you are standing.  Do not drive or use heavy machinery if you feel dizzy.  Avoid bending down if you feel dizzy. Place items in your home so that they are easy for you to reach without leaning over. Lifestyle  Do not use any products that contain nicotine or tobacco, such as cigarettes and e-cigarettes. If you need help quitting, ask your health care provider.  Try to reduce your stress level by using methods such as yoga or meditation. Talk with your health care provider if you need help to manage your stress. General instructions  Watch your dizziness for any changes.  Take over-the-counter and  prescription medicines only as told by your health care provider. Talk with your health care provider if you think that your dizziness is caused by a medicine that you are taking.  Tell a friend or a family member that you are feeling dizzy. If he or she notices any changes in your behavior, have this person call your health care provider.  Keep all follow-up visits as told by your health care provider. This is important. Contact a health care provider if:  Your dizziness does not go away.  Your dizziness or light-headedness gets worse.  You feel nauseous.  You have reduced hearing.  You have new symptoms.  You are unsteady on your feet or you feel like the room is spinning. Get help right away if:  You vomit or have diarrhea and are unable to eat or drink anything.  You have problems talking, walking, swallowing, or using your arms, hands, or legs.  You feel generally weak.  You are not thinking clearly or you have trouble forming sentences. It may take a friend or family member to notice this.  You have chest pain, abdominal pain, shortness of breath, or sweating.  Your vision changes.  You have any bleeding.  You have a severe headache.  You have neck pain or a stiff neck.  You have a fever. These symptoms may represent a serious problem that is an emergency. Do not wait to see if the symptoms will go away. Get medical help   right away. Call your local emergency services (911 in the [place] Do not drive yourself to the hospital. Summary  Dizziness is a feeling of unsteadiness or light-headedness. This condition can be caused by a number of things, including medicines, dehydration, or illness.  Anyone can become dizzy, but dizziness is more common in older adults.  Drink enough fluid to keep your urine clear or pale yellow. Do not drink alcohol.  Avoid making quick movements if you feel dizzy. Monitor your dizziness for any changes. This information is not intended to  replace advice given to you by your health care provider. Make sure you discuss any questions you have with your health care provider. Document Released: 10/08/2000 Document Revised: 05/17/2016 Document Reviewed: 05/17/2016 Elsevier Interactive Patient Education  2019 [place].  Pneumococcal Vaccine, Polyvalent solution for injection What is this medicine? PNEUMOCOCCAL VACCINE, POLYVALENT (NEU mo KOK al vak SEEN, pol ee VEY luhnt) is a vaccine to prevent pneumococcus bacteria infection. These bacteria are a major cause of ear infections, Strep throat infections, and serious pneumonia, meningitis, or blood infections worldwide. These vaccines help the body to produce antibodies (protective substances) that help your body defend against these bacteria. This vaccine is recommended for people 67 years of age and older with health problems. It is also recommended for all adults over 19 years old. This vaccine will not treat an infection. This medicine may be used for other purposes; ask your health care provider or pharmacist if you have questions. COMMON BRAND NAME(S): Pneumovax 23 What should I tell my health care provider before I take this medicine? They need to know if you have any of these conditions: -bleeding problems -bone marrow or organ transplant -cancer, Hodgkin's disease -fever -infection -immune system problems -low platelet count in the blood -seizures -an unusual or allergic reaction to pneumococcal vaccine, diphtheria toxoid, other vaccines, latex, other medicines, foods, dyes, or preservatives -pregnant or trying to get pregnant -breast-feeding How should I use this medicine? This vaccine is for injection into a muscle or under the skin. It is given by a health care professional. A copy of Vaccine Information Statements will be given before each vaccination. Read this sheet carefully each time. The sheet may change frequently. Talk to your pediatrician regarding the use of  this medicine in children. While this drug may be prescribed for children as young as 5 years of age for selected conditions, precautions do apply. Overdosage: If you think you have taken too much of this medicine contact a poison control center or emergency room at once. NOTE: This medicine is only for you. Do not share this medicine with others. What if I miss a dose? It is important not to miss your dose. Call your doctor or health care professional if you are unable to keep an appointment. What may interact with this medicine? -medicines for cancer chemotherapy -medicines that suppress your immune function -medicines that treat or prevent blood clots like warfarin, enoxaparin, and dalteparin -steroid medicines like prednisone or cortisone This list may not describe all possible interactions. Give your health care provider a list of all the medicines, herbs, non-prescription drugs, or dietary supplements you use. Also tell them if you smoke, drink alcohol, or use illegal drugs. Some items may interact with your medicine. What should I watch for while using this medicine? Mild fever and pain should go away in 3 days or less. Report any unusual symptoms to your doctor or health care professional. What side effects may I notice from  receiving this medicine? Side effects that you should report to your doctor or health care professional as soon as possible: -allergic reactions like skin rash, itching or hives, swelling of the face, lips, or tongue -breathing problems -confused -fever over 102 degrees F -pain, tingling, numbness in the hands or feet -seizures -unusual bleeding or bruising -unusual muscle weakness Side effects that usually do not require medical attention (report to your doctor or health care professional if they continue or are bothersome): -aches and pains -diarrhea -fever of 102 degrees F or less -headache -irritable -loss of appetite -pain, tender at site where  injected -trouble sleeping This list may not describe all possible side effects. Call your doctor for medical advice about side effects. You may report side effects to FDA at 1-800-FDA-1088. Where should I keep my medicine? This does not apply. This vaccine is given in a clinic, pharmacy, doctor's office, or other health care setting and will not be stored at home. NOTE: This sheet is a summary. It may not cover all possible information. If you have questions about this medicine, talk to your doctor, pharmacist, or health care provider.  2019 Elsevier/Gold Standard (2007-11-19 14:32:37)

## 2018-05-11 NOTE — Progress Notes (Signed)
Pre visit review using our clinic review tool, if applicable. No additional management support is needed unless otherwise documented below in the visit note. 

## 2018-05-11 NOTE — Telephone Encounter (Signed)
Report of CT of head and chest reported Bryson Ha at [facility] center. Called and notified office FC Judson Roch that results are available for view.

## 2018-05-12 ENCOUNTER — Other Ambulatory Visit: Payer: Self-pay | Admitting: Internal Medicine

## 2018-05-12 DIAGNOSIS — J329 Chronic sinusitis, unspecified: Secondary | ICD-10-CM

## 2018-05-12 MED ORDER — AMOXICILLIN-POT CLAVULANATE 875-125 MG PO TABS: 1.0000 | ORAL_TABLET | Freq: Two times a day (BID) | ORAL | 0 refills | Status: DC

## 2018-05-13 ENCOUNTER — Encounter: Payer: Self-pay | Admitting: Internal Medicine

## 2018-05-13 DIAGNOSIS — R918 Other nonspecific abnormal finding of lung field: Secondary | ICD-10-CM | POA: Insufficient documentation

## 2018-05-13 DIAGNOSIS — Z8673 Personal history of transient ischemic attack (TIA), and cerebral infarction without residual deficits: Secondary | ICD-10-CM | POA: Insufficient documentation

## 2018-05-13 DIAGNOSIS — Z87891 Personal history of nicotine dependence: Secondary | ICD-10-CM | POA: Insufficient documentation

## 2018-06-28 ENCOUNTER — Other Ambulatory Visit (INDEPENDENT_AMBULATORY_CARE_PROVIDER_SITE_OTHER): Payer: Medicare Other

## 2018-06-28 DIAGNOSIS — Z0184 Encounter for antibody response examination: Secondary | ICD-10-CM | POA: Diagnosis not present

## 2018-06-28 DIAGNOSIS — N183 Chronic kidney disease, stage 3 unspecified: Secondary | ICD-10-CM

## 2018-06-28 DIAGNOSIS — Z1329 Encounter for screening for other suspected endocrine disorder: Secondary | ICD-10-CM

## 2018-06-28 DIAGNOSIS — Z0000 Encounter for general adult medical examination without abnormal findings: Secondary | ICD-10-CM

## 2018-06-28 DIAGNOSIS — E782 Mixed hyperlipidemia: Secondary | ICD-10-CM

## 2018-06-28 DIAGNOSIS — E559 Vitamin D deficiency, unspecified: Secondary | ICD-10-CM

## 2018-06-28 DIAGNOSIS — Z1159 Encounter for screening for other viral diseases: Secondary | ICD-10-CM

## 2018-06-28 DIAGNOSIS — R7303 Prediabetes: Secondary | ICD-10-CM | POA: Diagnosis not present

## 2018-06-28 DIAGNOSIS — K861 Other chronic pancreatitis: Secondary | ICD-10-CM | POA: Diagnosis not present

## 2018-06-28 DIAGNOSIS — Z1389 Encounter for screening for other disorder: Secondary | ICD-10-CM

## 2018-06-28 DIAGNOSIS — Z125 Encounter for screening for malignant neoplasm of prostate: Secondary | ICD-10-CM

## 2018-06-28 LAB — COMPREHENSIVE METABOLIC PANEL
ALT: 18 U/L (ref 0–53)
AST: 20 U/L (ref 0–37)
Albumin: 4.3 g/dL (ref 3.5–5.2)
Alkaline Phosphatase: 45 U/L (ref 39–117)
BUN: 21 mg/dL (ref 6–23)
CO2: 29 mEq/L (ref 19–32)
Calcium: 9.7 mg/dL (ref 8.4–10.5)
Chloride: 102 mEq/L (ref 96–112)
Creatinine, Ser: 1.47 mg/dL (ref 0.40–1.50)
GFR: 46.32 mL/min — ABNORMAL LOW (ref >60.00)
Glucose, Bld: 115 mg/dL — ABNORMAL HIGH (ref 70–99)
Potassium: 4.6 mEq/L (ref 3.5–5.1)
Sodium: 139 mEq/L (ref 135–145)
Total Bilirubin: 0.8 mg/dL (ref 0.2–1.2)
Total Protein: 7.2 g/dL (ref 6.0–8.3)

## 2018-06-28 LAB — CBC WITH DIFFERENTIAL/PLATELET
Basophils Absolute: 0 10*3/uL (ref 0.0–0.1)
Basophils Relative: 0.6 % (ref 0.0–3.0)
Eosinophils Absolute: 0.1 10*3/uL (ref 0.0–0.7)
Eosinophils Relative: 1.8 % (ref 0.0–5.0)
HCT: 44.6 % (ref 39.0–52.0)
Hemoglobin: 15.2 g/dL (ref 13.0–17.0)
Lymphocytes Relative: 21.8 % (ref 12.0–46.0)
Lymphs Abs: 1.5 10*3/uL (ref 0.7–4.0)
MCHC: 34 g/dL (ref 30.0–36.0)
MCV: 102.6 fl — ABNORMAL HIGH (ref 78.0–100.0)
Monocytes Absolute: 0.6 10*3/uL (ref 0.1–1.0)
Monocytes Relative: 8.6 % (ref 3.0–12.0)
Neutro Abs: 4.8 10*3/uL (ref 1.4–7.7)
Neutrophils Relative %: 67.2 % (ref 43.0–77.0)
Platelets: 251 10*3/uL (ref 150.0–400.0)
RBC: 4.35 Mil/uL (ref 4.22–5.81)
RDW: 13.6 % (ref 11.5–15.5)
WBC: 7.1 10*3/uL (ref 4.0–10.5)

## 2018-06-28 LAB — LIPID PANEL
Cholesterol: 137 mg/dL (ref 0–200)
HDL: 40.1 mg/dL (ref >39.00)
LDL Cholesterol: 74 mg/dL (ref 0–99)
NonHDL: 97.39
Total CHOL/HDL Ratio: 3
Triglycerides: 115 mg/dL (ref 0.0–149.0)
VLDL: 23 mg/dL (ref 0.0–40.0)

## 2018-06-28 LAB — HEMOGLOBIN A1C: Hgb A1c MFr Bld: 6.2 % (ref 4.6–6.5)

## 2018-06-28 LAB — LIPASE: Lipase: 14 U/L (ref 11.0–59.0)

## 2018-06-28 LAB — TSH: TSH: 2.78 u[IU]/mL (ref 0.35–4.50)

## 2018-06-28 LAB — PSA, MEDICARE: PSA: 0.38 ng/ml (ref 0.10–4.00)

## 2018-06-28 LAB — VITAMIN D 25 HYDROXY (VIT D DEFICIENCY, FRACTURES): VITD: 68.38 ng/mL (ref 30.00–100.00)

## 2018-06-28 NOTE — Addendum Note (Signed)
Addended by: Arby Barrette on: 06/28/2018 07:58 AM   Modules accepted: Orders

## 2018-06-29 ENCOUNTER — Ambulatory Visit (INDEPENDENT_AMBULATORY_CARE_PROVIDER_SITE_OTHER): Payer: Medicare Other

## 2018-06-29 ENCOUNTER — Ambulatory Visit (INDEPENDENT_AMBULATORY_CARE_PROVIDER_SITE_OTHER): Payer: Medicare Other | Admitting: Vascular Surgery

## 2018-06-29 ENCOUNTER — Encounter (INDEPENDENT_AMBULATORY_CARE_PROVIDER_SITE_OTHER): Payer: Self-pay | Admitting: Vascular Surgery

## 2018-06-29 ENCOUNTER — Other Ambulatory Visit: Payer: Self-pay

## 2018-06-29 VITALS — BP 138/78 | HR 59 | Resp 16 | Ht 76.0 in | Wt 217.0 lb

## 2018-06-29 DIAGNOSIS — R7303 Prediabetes: Secondary | ICD-10-CM | POA: Diagnosis not present

## 2018-06-29 DIAGNOSIS — I10 Essential (primary) hypertension: Secondary | ICD-10-CM | POA: Diagnosis not present

## 2018-06-29 DIAGNOSIS — I701 Atherosclerosis of renal artery: Secondary | ICD-10-CM

## 2018-06-29 DIAGNOSIS — Z79899 Other long term (current) drug therapy: Secondary | ICD-10-CM

## 2018-06-29 DIAGNOSIS — I6523 Occlusion and stenosis of bilateral carotid arteries: Secondary | ICD-10-CM | POA: Diagnosis not present

## 2018-06-29 DIAGNOSIS — Z87891 Personal history of nicotine dependence: Secondary | ICD-10-CM

## 2018-06-29 LAB — URINALYSIS, ROUTINE W REFLEX MICROSCOPIC
Bilirubin, UA: NEGATIVE
Glucose, UA: NEGATIVE
Ketones, UA: NEGATIVE
Leukocytes, UA: NEGATIVE
Nitrite, UA: NEGATIVE
Protein, UA: NEGATIVE
RBC, UA: NEGATIVE
Specific Gravity, UA: 1.006 (ref 1.005–1.030)
Urobilinogen, Ur: 0.2 mg/dL (ref 0.2–1.0)
pH, UA: 6 (ref 5.0–7.5)

## 2018-06-29 LAB — MEASLES/MUMPS/RUBELLA IMMUNITY
Mumps IgG: >300 AU/mL
Rubella: 15.2 index
Rubeola IgG: >300 AU/mL

## 2018-06-29 LAB — HEPATITIS B SURFACE ANTIBODY, QUANTITATIVE: Hepatitis B-Post: <5 m[IU]/mL — ABNORMAL LOW (ref >=10)

## 2018-06-29 NOTE — Progress Notes (Signed)
MRN : [id number]  Keith Cruz is a 78 y.o. ([birth year]) male who presents with chief complaint of  Chief Complaint  Patient presents with  . Follow-up    ultrsound follow up  .  History of Present Illness: Patient returns today in follow up of multiple vascular issues.  He is doing well today without any new complaints or problems.  He denies any focal neurologic symptoms.  His renal function has remained stable and his blood pressure control is pretty good.  His renal artery duplex today shows stable moderately elevated velocities in the left renal artery without significant change from his previous study.  His right renal artery stent appears to be patent with normal velocities.  His kidney lengths are equal, normal, and stable.  He is also studied with carotid duplex today. The patient has mild to moderate restenosis in his carotids after bilateral carotid endarterectomies but nothing in the high-grade range at this point.  Current Outpatient Medications  Medication Sig Dispense Refill  . albuterol (PROVENTIL HFA;VENTOLIN HFA) 108 (90 Base) MCG/ACT inhaler Inhale 1-2 puffs into the lungs every 4 (four) hours as needed for wheezing or shortness of breath. 1 Inhaler 11  . Cholecalciferol (VITAMIN D3) 2000 UNITS capsule Take 2,000 Units by mouth daily. am    . clopidogrel (PLAVIX) 75 MG tablet Take 1 tablet (75 mg total) by mouth daily. (Patient taking differently: Take 75 mg by mouth daily. am) 90 tablet 3  . fexofenadine (ALLEGRA) 180 MG tablet TAKE 1 TABLET BY MOUTH DAILY (Patient taking differently: TAKE 1 TABLET BY MOUTH DAILY/ am) 90 tablet 3  . FLOVENT HFA 110 MCG/ACT inhaler TAKE 2 PUFFS BY MOUTH TWICE A DAY 36 g 1  . folic acid (FOLVITE) 1 MG tablet Take 0.8 mg by mouth daily. am    . glucose blood (ACCU-CHEK AVIVA PLUS) test strip Check blood sugar 1 x daily 100 each 5  . metoprolol tartrate (LOPRESSOR) 50 MG tablet Take 1 tablet (50 mg total) by mouth 2 (two) times daily. 180  tablet 3  . Multiple Vitamins-Minerals (CENTRUM SILVER 50+MEN PO) Take by mouth.    . pantoprazole (PROTONIX) 40 MG tablet Take 1 tablet (40 mg total) by mouth 2 (two) times daily. (Patient taking differently: Take 40 mg by mouth 2 (two) times daily. Am and dinner) 180 tablet 3  . simvastatin (ZOCOR) 40 MG tablet Take 1 tablet (40 mg total) by mouth daily. am 90 tablet 3  . amoxicillin-clavulanate (AUGMENTIN) 875-125 MG tablet Take 1 tablet by mouth 2 (two) times daily. With food (Patient not taking: Reported on 06/29/2018) 14 tablet 0   Current Facility-Administered Medications  Medication Dose Route Frequency Provider Last Rate Last Dose  . ipratropium-albuterol (DUONEB) 0.5-2.5 (3) MG/3ML nebulizer solution 3 mL  3 mL Nebulization Once Olin Hauser, DO        Past Medical History:  Diagnosis Date  . Arthritis    back  . Asthma   . Atherosclerosis of aorta ([place])   . Atherosclerosis of aorta ([place])   . Bronchitis 05/2017   chronic  . CAD (coronary artery disease)   . Carotid artery disease ([place])   . Carotid stenosis   . Carotid stenosis, bilateral   . Chronic bronchitis ([place])   . Chronic cough   . COPD (chronic obstructive pulmonary disease) ([place])   . Dysrhythmia   . GERD (gastroesophageal reflux disease)   . Heart murmur   . HOH (hard of hearing)  hearing aids  . Hypercholesteremia   . Hypertension   . Kidney disease    stent  . Macular degeneration of left eye   . Myocardial infarction ([place]) 2016  . Pre-diabetes   . Seasonal allergies   . Seizure ([place])   . Seizures (HCC)    viral meningitis  . Stroke [facility])    1991 and imaging 05/12/15 old strokes  . TIA (transient ischemic attack) 1991   x2  . Uses hearing aid   . Viral meningitis 2017  . Viral meningitis   . Wears dentures    upper and lower    Past Surgical History:  Procedure Laterality Date  . CAROTID ENDARTERECTOMY Left 2014  . CAROTID ENDARTERECTOMY Right 1991  . CATARACT EXTRACTION W/PHACO  Left 06/24/2017   Procedure: CATARACT EXTRACTION PHACO AND INTRAOCULAR LENS PLACEMENT ([place]) LEFT BORDERLINE DIABETIC;  Surgeon: Leandrew Koyanagi, MD;  Location: [place];  Service: Ophthalmology;  Laterality: Left;  . CATARACT EXTRACTION W/PHACO Right 07/22/2017   Procedure: CATARACT EXTRACTION PHACO AND INTRAOCULAR LENS PLACEMENT ([place]) RIGHT BORDERLINE DIABETIC;  Surgeon: Leandrew Koyanagi, MD;  Location: [place];  Service: Ophthalmology;  Laterality: Right;  . CORONARY ARTERY BYPASS GRAFT     x5  . SKIN LESION EXCISION Left    left ear  . URETERAL STENT PLACEMENT      Social History  Substance Use Topics  . Smoking status: Former Smoker    Packs/day: 2.00    Years: 10.00    Types: Cigarettes  . Smokeless tobacco: Former Systems developer  . Alcohol use No     Comment: previous    Family History      Family History  Problem Relation Age of Onset  . Stroke Mother   . Heart attack Mother   . Stroke Father   . Stroke Paternal Grandmother   . Stroke Paternal Grandfather          Allergies  Allergen Reactions  . Fexofenadine-Pseudoephed Er Swelling    Tongue and face. Only with D product. Tolerates plain allegra at home.  Tongue and face. Only with D product. Tolerates plain allegra at home.   . Nifedipine Swelling    Tongue and face Tongue and face Tongue and face  . Allegra [Fexofenadine] Swelling    Tongue and face. Only with D product. Tolerates plain allegra at home.   . Prednisone     Other reaction(s): Other (See Comments) Mental status changes Other reaction(s): Other (See Comments) Mental status changes     REVIEW OF SYSTEMS(Negative unless checked)  Constitutional: [] ?Weight loss[] ?Fever[] ?Chills Cardiac:[] ?Chest pain[] ?Chest pressure[x] ?Palpitations [] ?Shortness of breath when laying flat [] ?Shortness of breath at rest [x] ?Shortness of breath with exertion. Vascular: [] ?Pain in  legs with walking[] ?Pain in legsat rest[] ?Pain in legs when laying flat [] ?Claudication [] ?Pain in feet when walking [] ?Pain in feet at rest [] ?Pain in feet when laying flat [] ?History of DVT [] ?Phlebitis [] ?Swelling in legs [] ?Varicose veins [] ?Non-healing ulcers Pulmonary: [] ?Uses home oxygen [] ?Productive cough[] ?Hemoptysis [] ?Wheeze [] ?COPD [] ?Asthma Neurologic: [] ?Dizziness [] ?Blackouts [] ?Seizures [] ?History of stroke [] ?History of TIA[] ?Aphasia [] ?Temporary blindness[] ?Dysphagia [] ?Weaknessor numbness in arms [] ?Weakness or numbnessin legs Musculoskeletal: [] ?Arthritis [] ?Joint swelling [] ?Joint pain [] ?Low back pain Hematologic:[] ?Easy bruising[] ?Easy bleeding [] ?Hypercoagulable state [] ?Anemic [] ?Hepatitis Gastrointestinal:[] ?Blood in stool[] ?Vomiting blood[] ?Gastroesophageal reflux/heartburn[] ?Difficulty swallowing. Genitourinary: [] ?Chronic kidney disease [] ?Difficulturination [] ?Frequenturination [] ?Burning with urination[] ?Blood in urine Skin: [] ?Rashes [] ?Ulcers [] ?Wounds Psychological: [] ?History of anxiety[] ?History of major depression.     Physical Examination  BP 138/78 (BP Location: Right Arm)   Pulse (!) 59   Resp 16  Ht 6\' 4"  (1.93 m)   Wt 217 lb (98.4 kg)   BMI 26.41 kg/m  Gen:  WD/WN, NAD Head: [place]/AT, No temporalis wasting. Ear/Nose/Throat: Hearing grossly intact, nares w/o erythema or drainage Eyes: Conjunctiva clear. Sclera non-icteric Neck: Supple.  Trachea midline Pulmonary:  Good air movement, no use of accessory muscles.  Cardiac: Irregular Vascular:  Vessel Right Left  Radial Palpable Palpable                                   Gastrointestinal: soft, non-tender/non-distended.  Musculoskeletal: M/S 5/5 throughout.  No deformity or atrophy. No edema. Neurologic: Sensation grossly intact in extremities.  Symmetrical.  Speech is fluent.    Psychiatric: Judgment intact, Mood & affect appropriate for pt's clinical situation. Dermatologic: No rashes or ulcers noted.  No cellulitis or open wounds.       Labs Recent Results (from the past 2160 hour(s))  Urinalysis, Routine w reflex microscopic     Status: None   Collection Time: 06/28/18  7:58 AM  Result Value Ref Range   Specific Gravity, UA 1.006 1.005 - 1.030   pH, UA 6.0 5.0 - 7.5   Color, UA Yellow Yellow   Appearance Ur Clear Clear   Leukocytes, UA Negative Negative   Protein, UA Negative Negative/Trace   Glucose, UA Negative Negative   Ketones, UA Negative Negative   RBC, UA Negative Negative   Bilirubin, UA Negative Negative   Urobilinogen, Ur 0.2 0.2 - 1.0 mg/dL   Nitrite, UA Negative Negative   Microscopic Examination Comment     Comment: Microscopic not indicated and not performed.  Measles/Mumps/Rubella Immunity     Status: None   Collection Time: 06/28/18  7:58 AM  Result Value Ref Range   [place]ola IgG >300.00 AU/mL    Comment: AU/mL            Interpretation -----            -------------- <13.50           Negative 13.50-16.49      Equivocal >16.49           Positive . A positive result indicates that the patient has antibody to measles virus. It does not differentiate  between an active or past infection. The clinical  diagnosis must be interpreted in conjunction with  clinical signs and symptoms of the patient.    Mumps IgG >300.00 AU/mL    Comment:  AU/mL           Interpretation -------         ---------------- <9.00             Negative 9.00-10.99        Equivocal >10.99            Positive A positive result indicates that the patient has  antibody to mumps virus. It does not differentiate between an  active or past infection. The clinical diagnosis must be interpreted in conjunction with clinical signs and symptoms of the patient. .    Rubella 15.20 index    Comment:     Index            Interpretation     -----             --------------       <0.90            Not consistent with Immunity  0.90-0.99        Equivocal     > or = 1.00      Consistent with Immunity  . The presence of rubella IgG antibody suggests  immunization or past or current infection with rubella virus.   Hepatitis B surface antibody,quantitative     Status: Abnormal   Collection Time: 06/28/18  7:58 AM  Result Value Ref Range   Hepatitis B-Post <5 (L) > OR = 10 mIU/mL    Comment: . Patient does not have immunity to hepatitis B virus. . For additional information, please refer to http://education.questdiagnostics.com/faq/FAQ105 (This link is being provided for informational/ educational purposes only).   PSA, Medicare ( [place] only)     Status: None   Collection Time: 06/28/18  7:58 AM  Result Value Ref Range   PSA 0.38 0.10 - 4.00 ng/ml    Comment: Test performed using Access Hybritech PSA Assay, a parmagnetic partical, chemiluminecent immunoassay.  Vitamin D (25 hydroxy)     Status: None   Collection Time: 06/28/18  7:58 AM  Result Value Ref Range   VITD 68.38 30.00 - 100.00 ng/mL  Hemoglobin A1c     Status: None   Collection Time: 06/28/18  7:58 AM  Result Value Ref Range   Hgb A1c MFr Bld 6.2 4.6 - 6.5 %    Comment: Glycemic Control Guidelines for People with Diabetes:Non Diabetic:  <6%Goal of Therapy: <7%Additional Action Suggested:  >8%   TSH     Status: None   Collection Time: 06/28/18  7:58 AM  Result Value Ref Range   TSH 2.78 0.35 - 4.50 uIU/mL  Lipase     Status: None   Collection Time: 06/28/18  7:58 AM  Result Value Ref Range   Lipase 14.0 11.0 - 59.0 U/L  Lipid panel     Status: None   Collection Time: 06/28/18  7:58 AM  Result Value Ref Range   Cholesterol 137 0 - 200 mg/dL    Comment: ATP III Classification       Desirable:  < 200 mg/dL               Borderline High:  200 - 239 mg/dL          High:  > = 240 mg/dL   Triglycerides 115.0 0.0 - 149.0 mg/dL    Comment: Normal:  <150  mg/dLBorderline High:  150 - 199 mg/dL   HDL 40.10 >39.00 mg/dL   VLDL 23.0 0.0 - 40.0 mg/dL   LDL Cholesterol 74 0 - 99 mg/dL   Total CHOL/HDL Ratio 3     Comment:                Men          Women1/2 Average Risk     3.4          3.3Average Risk          5.0          4.42X Average Risk          9.6          7.13X Average Risk          15.0          11.0                       NonHDL 97.39     Comment: NOTE:  Non-HDL goal should be 30 mg/dL higher than patient's LDL goal (i.e.  LDL goal of < 70 mg/dL, would have non-HDL goal of < 100 mg/dL)  CBC with Differential/Platelet     Status: Abnormal   Collection Time: 06/28/18  7:58 AM  Result Value Ref Range   WBC 7.1 4.0 - 10.5 K/uL   RBC 4.35 4.22 - 5.81 Mil/uL   Hemoglobin 15.2 13.0 - 17.0 g/dL   HCT 44.6 39.0 - 52.0 %   MCV 102.6 (H) 78.0 - 100.0 fl   MCHC 34.0 30.0 - 36.0 g/dL   RDW 13.6 11.5 - 15.5 %   Platelets 251.0 150.0 - 400.0 K/uL   Neutrophils Relative % 67.2 43.0 - 77.0 %   Lymphocytes Relative 21.8 12.0 - 46.0 %   Monocytes Relative 8.6 3.0 - 12.0 %   Eosinophils Relative 1.8 0.0 - 5.0 %   Basophils Relative 0.6 0.0 - 3.0 %   Neutro Abs 4.8 1.4 - 7.7 K/uL   Lymphs Abs 1.5 0.7 - 4.0 K/uL   Monocytes Absolute 0.6 0.1 - 1.0 K/uL   Eosinophils Absolute 0.1 0.0 - 0.7 K/uL   Basophils Absolute 0.0 0.0 - 0.1 K/uL  Comprehensive metabolic panel     Status: Abnormal   Collection Time: 06/28/18  7:58 AM  Result Value Ref Range   Sodium 139 135 - 145 mEq/L   Potassium 4.6 3.5 - 5.1 mEq/L   Chloride 102 96 - 112 mEq/L   CO2 29 19 - 32 mEq/L   Glucose, Bld 115 (H) 70 - 99 mg/dL   BUN 21 6 - 23 mg/dL   Creatinine, Ser 1.47 0.40 - 1.50 mg/dL   Total Bilirubin 0.8 0.2 - 1.2 mg/dL   Alkaline Phosphatase 45 39 - 117 U/L   AST 20 0 - 37 U/L   ALT 18 0 - 53 U/L   Total Protein 7.2 6.0 - 8.3 g/dL   Albumin 4.3 3.5 - 5.2 g/dL   Calcium 9.7 8.4 - 10.5 mg/dL   GFR 46.32 (L) >60.00 mL/min    Radiology No results  found.  Assessment/Plan Benign essential HTN blood pressure control important in reducing the progression of atherosclerotic disease. On appropriate oral medications.   Diabetes mellitus type 2 with complications blood glucose control important in reducing the progression of atherosclerotic disease. Also, involved in wound healing. On appropriate medications.  Renal artery stenosis (HCC) His renal artery duplex today shows stable moderately elevated velocities in the left renal artery without significant change from his previous study.  His right renal artery stent appears to be patent with normal velocities.  His kidney lengths are equal, normal, and stable. No changes in medications and see in one year.  Bilateral carotid artery stenosis The patient has mild to moderate restenosis in his carotids after bilateral carotid endarterectomies. Mild progression but nothing worrisome at this point.  Recheck in one year.    Leotis Pain, MD  06/29/2018 11:45 AM    This note was created with Dragon medical transcription system.  Any errors from dictation are purely unintentional

## 2018-06-29 NOTE — Assessment & Plan Note (Signed)
His renal artery duplex today shows stable moderately elevated velocities in the left renal artery without significant change from his previous study.  His right renal artery stent appears to be patent with normal velocities.  His kidney lengths are equal, normal, and stable. No changes in medications and see in one year.

## 2018-06-29 NOTE — Assessment & Plan Note (Signed)
The patient has mild to moderate restenosis in his carotids after bilateral carotid endarterectomies. Mild progression but nothing worrisome at this point.  Recheck in one year.

## 2018-07-02 ENCOUNTER — Encounter: Payer: Self-pay | Admitting: Internal Medicine

## 2018-07-02 ENCOUNTER — Ambulatory Visit (INDEPENDENT_AMBULATORY_CARE_PROVIDER_SITE_OTHER): Payer: Medicare Other | Admitting: Internal Medicine

## 2018-07-02 VITALS — BP 136/64 | HR 59 | Temp 97.7°F | Ht 76.0 in | Wt 219.6 lb

## 2018-07-02 DIAGNOSIS — I6523 Occlusion and stenosis of bilateral carotid arteries: Secondary | ICD-10-CM | POA: Diagnosis not present

## 2018-07-02 DIAGNOSIS — B354 Tinea corporis: Secondary | ICD-10-CM

## 2018-07-02 DIAGNOSIS — I701 Atherosclerosis of renal artery: Secondary | ICD-10-CM

## 2018-07-02 DIAGNOSIS — E785 Hyperlipidemia, unspecified: Secondary | ICD-10-CM

## 2018-07-02 DIAGNOSIS — Z77090 Contact with and (suspected) exposure to asbestos: Secondary | ICD-10-CM | POA: Diagnosis not present

## 2018-07-02 NOTE — Patient Instructions (Addendum)
Results for RAUDEL, BAZEN (MRN [id number]) as of 07/02/2018 10:49  Ref. Range 06/28/2018 07:58  Glucose Latest Ref Range: 70 - 99 mg/dL 115 (H)  Hemoglobin A1C Latest Ref Range: 4.6 - 6.5 % 6.2    rec Tdap vaccine at your pharmacy   Tdap Vaccine (Tetanus, Diphtheria and Pertussis): What You Need to Know 1. Why get vaccinated? Tetanus, diphtheria and pertussis are very serious diseases. Tdap vaccine can protect [place] from these diseases. And, Tdap vaccine given to pregnant women can protect newborn babies against pertussis.Marland Kitchen TETANUS (Lockjaw) is rare in the [place] today. It causes painful muscle tightening and stiffness, usually all over the body.  It can lead to tightening of muscles in the head and neck so you can't open your mouth, swallow, or sometimes even breathe. Tetanus kills about 1 out of 10 people who are infected even after receiving the best medical care. DIPHTHERIA is also rare in the [place] today. It can cause a thick coating to form in the back of the throat.  It can lead to breathing problems, heart failure, paralysis, and death. PERTUSSIS (Whooping Cough) causes severe coughing spells, which can cause difficulty breathing, vomiting and disturbed sleep.  It can also lead to weight loss, incontinence, and rib fractures. Up to 2 in 100 adolescents and 5 in 100 adults with pertussis are hospitalized or have complications, which could include pneumonia or death. These diseases are caused by bacteria. Diphtheria and pertussis are spread from person to person through secretions from coughing or sneezing. Tetanus enters the body through cuts, scratches, or wounds. Before vaccines, as many as 200,000 cases of diphtheria, 200,000 cases of pertussis, and hundreds of cases of tetanus, were reported in the [place] each year. Since vaccination began, reports of cases for tetanus and diphtheria have dropped by about 99% and for pertussis by about 80%. 2. Tdap vaccine Tdap  vaccine can protect adolescents and adults from tetanus, diphtheria, and pertussis. One dose of Tdap is routinely given at age 36 or 53. People who did not get Tdap at that age should get it as soon as possible. Tdap is especially important for healthcare professionals and anyone having close contact with a baby younger than 12 months. Pregnant women should get a dose of Tdap during every pregnancy, to protect the newborn from pertussis. Infants are most at risk for severe, life-threatening complications from pertussis. Another vaccine, called Td, protects against tetanus and diphtheria, but not pertussis. A Td booster should be given every 10 years. Tdap may be given as one of these boosters if you have never gotten Tdap before. Tdap may also be given after a severe cut or burn to prevent tetanus infection. Your doctor or the person giving you the vaccine can give you more information. Tdap may safely be given at the same time as other vaccines. 3. Some people should not get this vaccine  A person who has ever had a life-threatening allergic reaction after a previous dose of any diphtheria, tetanus or pertussis containing vaccine, OR has a severe allergy to any part of this vaccine, should not get Tdap vaccine. Tell the person giving the vaccine about any severe allergies.  Anyone who had coma or long repeated seizures within 7 days after a childhood dose of DTP or DTaP, or a previous dose of Tdap, should not get Tdap, unless a cause other than the vaccine was found. They can still get Td.  Talk to your doctor if you: ? have  seizures or another nervous system problem, ? had severe pain or swelling after any vaccine containing diphtheria, tetanus or pertussis, ? ever had a condition called Guillain-Barr Syndrome (GBS), ? aren't feeling well on the day the shot is scheduled. 4. Risks With any medicine, including vaccines, there is a chance of side effects. These are usually mild and go away on  their own. Serious reactions are also possible but are rare. Most people who get Tdap vaccine do not have any problems with it. Mild problems following Tdap (Did not interfere with activities)  Pain where the shot was given (about 3 in 4 adolescents or 2 in 3 adults)  Redness or swelling where the shot was given (about 1 person in 5)  Mild fever of at least 100.59F (up to about 1 in 25 adolescents or 1 in 100 adults)  Headache (about 3 or 4 people in 10)  Tiredness (about 1 person in 3 or 4)  Nausea, vomiting, diarrhea, stomach ache (up to 1 in 4 adolescents or 1 in 10 adults)  Chills, sore joints (about 1 person in 10)  Body aches (about 1 person in 3 or 4)  Rash, swollen glands (uncommon) Moderate problems following Tdap (Interfered with activities, but did not require medical attention)  Pain where the shot was given (up to 1 in 5 or 6)  Redness or swelling where the shot was given (up to about 1 in 16 adolescents or 1 in 12 adults)  Fever over 102F (about 1 in 100 adolescents or 1 in 250 adults)  Headache (about 1 in 7 adolescents or 1 in 10 adults)  Nausea, vomiting, diarrhea, stomach ache (up to 1 or 3 people in 100)  Swelling of the entire arm where the shot was given (up to about 1 in 500). Severe problems following Tdap (Unable to perform usual activities; required medical attention)  Swelling, severe pain, bleeding and redness in the arm where the shot was given (rare). Problems that could happen after any vaccine:  People sometimes faint after a medical procedure, including vaccination. Sitting or lying down for about 15 minutes can help prevent fainting, and injuries caused by a fall. Tell your doctor if you feel dizzy, or have vision changes or ringing in the ears.  Some people get severe pain in the shoulder and have difficulty moving the arm where a shot was given. This happens very rarely.  Any medication can cause a severe allergic reaction. Such  reactions from a vaccine are very rare, estimated at fewer than 1 in a million doses, and would happen within a few minutes to a few hours after the vaccination. As with any medicine, there is a very remote chance of a vaccine causing a serious injury or death. The safety of vaccines is always being monitored. For more information, visit: http://www.aguilar.org/ 5. What if there is a serious problem? What should I look for?  Look for anything that concerns you, such as signs of a severe allergic reaction, very high fever, or unusual behavior. Signs of a severe allergic reaction can include hives, swelling of the face and throat, difficulty breathing, a fast heartbeat, dizziness, and weakness. These would usually start a few minutes to a few hours after the vaccination. What should I do?  If you think it is a severe allergic reaction or other emergency that can't wait, call 9-1-1 or get the person to the nearest hospital. Otherwise, call your doctor.  Afterward, the reaction should be reported to the Vaccine  Adverse Event Reporting System (VAERS). Your doctor might file this report, or you can do it yourself through the VAERS web site at www.vaers.SamedayNews.es, or by calling 251-879-3765. VAERS does not give medical advice. 6. The National Vaccine Injury Compensation Program The [employer] Vaccine Injury Compensation Program (VICP) is a federal program that was created to compensate people who may have been injured by certain vaccines. Persons who believe they may have been injured by a vaccine can learn about the program and about filing a claim by calling 757-788-2227 or visiting the [place] website at GoldCloset.com.ee. There is a time limit to file a claim for compensation. 7. How can I learn more?  Ask your doctor. He or she can give you the vaccine package insert or suggest other sources of information.  Call your local or state health department.  Contact the Centers for  Disease Control and Prevention (CDC): ? Call (816) 160-4908 (1-800-CDC-INFO) or ? Visit CDC's website at http://hunter.com/ Vaccine Information Statement Tdap Vaccine (06/21/2013) This information is not intended to replace advice given to you by your health care provider. Make sure you discuss any questions you have with your health care provider. Document Released: 10/14/2011 Document Revised: 11/30/2017 Document Reviewed: 11/30/2017 Elsevier Interactive Patient Education  2019 [employer].

## 2018-07-02 NOTE — Progress Notes (Signed)
Pre visit review using our clinic review tool, if applicable. No additional management support is needed unless otherwise documented below in the visit note. 

## 2018-07-02 NOTE — Progress Notes (Addendum)
Chief Complaint  Patient presents with  . Annual Exam   Annual reviewed labs  1. HLD controlled, A1C 6.2 not 6.5  2. Renal artery and CAS per [place] 06/29/2018  3. CT + asbestos 04/2018 will f/u with Dr. Raul Del 07/13/2018     Review of Systems  Constitutional: Negative for weight loss.  HENT: Negative for hearing loss.   Eyes: Negative for blurred vision.  Respiratory: Negative for shortness of breath.   Cardiovascular: Negative for chest pain.  Gastrointestinal: Negative for abdominal pain.  Musculoskeletal: Negative for falls.  Skin: Negative for rash.  Neurological: Negative for headaches.  Psychiatric/Behavioral: Negative for depression.   Past Medical History:  Diagnosis Date  . Arthritis    back  . Asthma   . Atherosclerosis of aorta ([place])   . Atherosclerosis of aorta ([place])   . Bronchitis 05/2017   chronic  . CAD (coronary artery disease)   . Carotid artery disease ([place])   . Carotid stenosis   . Carotid stenosis, bilateral   . Chronic bronchitis ([place])   . Chronic cough   . COPD (chronic obstructive pulmonary disease) ([place])   . Dysrhythmia   . GERD (gastroesophageal reflux disease)   . Heart murmur   . HOH (hard of hearing)    hearing aids  . Hypercholesteremia   . Hypertension   . Kidney disease    stent  . Macular degeneration of left eye   . Myocardial infarction ([place]) 2016  . Pre-diabetes   . Seasonal allergies   . Seizure ([place])   . Seizures (HCC)    viral meningitis  . Stroke [facility])    1991 and imaging 05/12/15 old strokes  . TIA (transient ischemic attack) 1991   x2  . Uses hearing aid   . Viral meningitis 2017  . Viral meningitis   . Wears dentures    upper and lower   Past Surgical History:  Procedure Laterality Date  . CAROTID ENDARTERECTOMY Left 2014  . CAROTID ENDARTERECTOMY Right 1991  . CATARACT EXTRACTION W/PHACO Left 06/24/2017   Procedure: CATARACT EXTRACTION PHACO AND INTRAOCULAR LENS PLACEMENT ([place]) LEFT BORDERLINE DIABETIC;  Surgeon:  Leandrew Koyanagi, MD;  Location: [place];  Service: Ophthalmology;  Laterality: Left;  . CATARACT EXTRACTION W/PHACO Right 07/22/2017   Procedure: CATARACT EXTRACTION PHACO AND INTRAOCULAR LENS PLACEMENT ([place]) RIGHT BORDERLINE DIABETIC;  Surgeon: Leandrew Koyanagi, MD;  Location: [place];  Service: Ophthalmology;  Laterality: Right;  . CORONARY ARTERY BYPASS GRAFT     x5  . SKIN LESION EXCISION Left    left ear  . URETERAL STENT PLACEMENT     Family History  Problem Relation Age of Onset  . Stroke Mother   . Heart attack Mother   . Stroke Father   . Stroke Paternal Grandmother   . Stroke Paternal Grandfather   . Alzheimer's disease Paternal Grandfather   . Stroke Sister   . Alzheimer's disease Sister   . Stroke Brother   . Alzheimer's disease Maternal Grandmother    Social History   Socioeconomic History  . Marital status: Married    Spouse name: Not on file  . Number of children: Not on file  . Years of education: Not on file  . Highest education level: 10th grade  Occupational History  . Not on file  Social Needs  . Financial resource strain: Not hard at all  . Food insecurity:    Worry: Never true    Inability: Never true  . Transportation  needs:    Medical: No    Non-medical: No  Tobacco Use  . Smoking status: Former Smoker    Packs/day: 2.00    Years: 10.00    Pack years: 20.00    Types: Cigarettes    Last attempt to quit: 04/28/1970    Years since quitting: 48.2  . Smokeless tobacco: Former Network engineer and Sexual Activity  . Alcohol use: No    Alcohol/week: 0.0 standard drinks    Comment: previous  . Drug use: No  . Sexual activity: Yes    Comment: wife   Lifestyle  . Physical activity:    Days per week: 0 days    Minutes per session: 0 min  . Stress: Not at all  Relationships  . Social connections:    Talks on phone: More than three times a week    Gets together: More than three times a week    Attends religious  service: More than 4 times per year    Active member of club or organization: No    Attends meetings of clubs or organizations: Never    Relationship status: Married  . Intimate partner violence:    Fear of current or ex partner: No    Emotionally abused: No    Physically abused: No    Forced sexual activity: No  Other Topics Concern  . Not on file  Social History Narrative   Re-married x 3 years    Retired    Owns guns, wears seat belt, safe in relationship    2 sons and 1 daughter    Current Meds  Medication Sig  . albuterol (PROVENTIL HFA;VENTOLIN HFA) 108 (90 Base) MCG/ACT inhaler Inhale 1-2 puffs into the lungs every 4 (four) hours as needed for wheezing or shortness of breath.  . Cholecalciferol (VITAMIN D3) 2000 UNITS capsule Take 2,000 Units by mouth daily. am  . clopidogrel (PLAVIX) 75 MG tablet Take 1 tablet (75 mg total) by mouth daily. (Patient taking differently: Take 75 mg by mouth daily. am)  . fexofenadine (ALLEGRA) 180 MG tablet TAKE 1 TABLET BY MOUTH DAILY (Patient taking differently: TAKE 1 TABLET BY MOUTH DAILY/ am)  . FLOVENT HFA 110 MCG/ACT inhaler TAKE 2 PUFFS BY MOUTH TWICE A DAY  . folic acid (FOLVITE) 1 MG tablet Take 0.8 mg by mouth daily. am  . glucose blood (ACCU-CHEK AVIVA PLUS) test strip Check blood sugar 1 x daily  . metoprolol tartrate (LOPRESSOR) 50 MG tablet Take 1 tablet (50 mg total) by mouth 2 (two) times daily.  . Multiple Vitamins-Minerals (CENTRUM SILVER 50+MEN PO) Take by mouth.  . pantoprazole (PROTONIX) 40 MG tablet Take 1 tablet (40 mg total) by mouth 2 (two) times daily. (Patient taking differently: Take 40 mg by mouth 2 (two) times daily. Am and dinner)  . simvastatin (ZOCOR) 40 MG tablet Take 1 tablet (40 mg total) by mouth daily. am   Current Facility-Administered Medications for the 07/02/18 encounter (Office Visit) with McLean-Scocuzza, Nino Glow, MD  Medication  . ipratropium-albuterol (DUONEB) 0.5-2.5 (3) MG/3ML nebulizer solution 3  mL   Allergies  Allergen Reactions  . Fexofenadine-Pseudoephed Er Swelling    Tongue and face. Only with D product. Tolerates plain allegra at home.  .   . Nifedipine Swelling    Tongue and face    . Allegra  [Fexofenadine] Swelling    Tongue and face. Only with D product. Tolerates plain allegra at home.   Jearl Klinefelter Ellipta [Umeclidinium-Vilanterol]   .  Prednisone Other (See Comments)    Oral prednisone. Confusion. Mental status changes   Recent Results (from the past 2160 hour(s))  Urinalysis, Routine w reflex microscopic     Status: None   Collection Time: 06/28/18  7:58 AM  Result Value Ref Range   Specific Gravity, UA 1.006 1.005 - 1.030   pH, UA 6.0 5.0 - 7.5   Color, UA Yellow Yellow   Appearance Ur Clear Clear   Leukocytes, UA Negative Negative   Protein, UA Negative Negative/Trace   Glucose, UA Negative Negative   Ketones, UA Negative Negative   RBC, UA Negative Negative   Bilirubin, UA Negative Negative   Urobilinogen, Ur 0.2 0.2 - 1.0 mg/dL   Nitrite, UA Negative Negative   Microscopic Examination Comment     Comment: Microscopic not indicated and not performed.  Measles/Mumps/Rubella Immunity     Status: None   Collection Time: 06/28/18  7:58 AM  Result Value Ref Range   R[place]ola IgG >300.00 AU/mL    Comment: AU/mL            Interpretation -----            -------------- <13.50           Negative 13.50-16.49      Equivocal >16.49           Positive . A positive result indicates that the patient has antibody to measles virus. It does not differentiate  between an active or past infection. The clinical  diagnosis must be interpreted in conjunction with  clinical signs and symptoms of the patient.    Mumps IgG >300.00 AU/mL    Comment:  AU/mL           Interpretation -------         ---------------- <9.00             Negative 9.00-10.99        Equivocal >10.99            Positive A positive result indicates that the patient has  antibody to mumps  virus. It does not differentiate between an  active or past infection. The clinical diagnosis must be interpreted in conjunction with clinical signs and symptoms of the patient. .    Rubella 15.20 index    Comment:     Index            Interpretation     -----            --------------       <0.90            Not consistent with Immunity     0.90-0.99        Equivocal     > or = 1.00      Consistent with Immunity  . The presence of rubella IgG antibody suggests  immunization or past or current infection with rubella virus.   Hepatitis B surface antibody,quantitative     Status: Abnormal   Collection Time: 06/28/18  7:58 AM  Result Value Ref Range   Hepatitis B-Post <5 (L) > OR = 10 mIU/mL    Comment: . Patient does not have immunity to hepatitis B virus. . For additional information, please refer to http://education.questdiagnostics.com/faq/FAQ105 (This link is being provided for informational/ educational purposes only).   PSA, Medicare ( [place] only)     Status: None   Collection Time: 06/28/18  7:58 AM  Result Value Ref Range   PSA 0.38 0.10 -  4.00 ng/ml    Comment: Test performed using Access Hybritech PSA Assay, a parmagnetic partical, chemiluminecent immunoassay.  Vitamin D (25 hydroxy)     Status: None   Collection Time: 06/28/18  7:58 AM  Result Value Ref Range   VITD 68.38 30.00 - 100.00 ng/mL  Hemoglobin A1c     Status: None   Collection Time: 06/28/18  7:58 AM  Result Value Ref Range   Hgb A1c MFr Bld 6.2 4.6 - 6.5 %    Comment: Glycemic Control Guidelines for People with Diabetes:Non Diabetic:  <6%Goal of Therapy: <7%Additional Action Suggested:  >8%   TSH     Status: None   Collection Time: 06/28/18  7:58 AM  Result Value Ref Range   TSH 2.78 0.35 - 4.50 uIU/mL  Lipase     Status: None   Collection Time: 06/28/18  7:58 AM  Result Value Ref Range   Lipase 14.0 11.0 - 59.0 U/L  Lipid panel     Status: None   Collection Time: 06/28/18  7:58 AM    Result Value Ref Range   Cholesterol 137 0 - 200 mg/dL    Comment: ATP III Classification       Desirable:  < 200 mg/dL               Borderline High:  200 - 239 mg/dL          High:  > = 240 mg/dL   Triglycerides 115.0 0.0 - 149.0 mg/dL    Comment: Normal:  <150 mg/dLBorderline High:  150 - 199 mg/dL   HDL 40.10 >39.00 mg/dL   VLDL 23.0 0.0 - 40.0 mg/dL   LDL Cholesterol 74 0 - 99 mg/dL   Total CHOL/HDL Ratio 3     Comment:                Men          Women1/2 Average Risk     3.4          3.3Average Risk          5.0          4.42X Average Risk          9.6          7.13X Average Risk          15.0          11.0                       NonHDL 97.39     Comment: NOTE:  Non-HDL goal should be 30 mg/dL higher than patient's LDL goal (i.e. LDL goal of < 70 mg/dL, would have non-HDL goal of < 100 mg/dL)  CBC with Differential/Platelet     Status: Abnormal   Collection Time: 06/28/18  7:58 AM  Result Value Ref Range   WBC 7.1 4.0 - 10.5 K/uL   RBC 4.35 4.22 - 5.81 Mil/uL   Hemoglobin 15.2 13.0 - 17.0 g/dL   HCT 44.6 39.0 - 52.0 %   MCV 102.6 (H) 78.0 - 100.0 fl   MCHC 34.0 30.0 - 36.0 g/dL   RDW 13.6 11.5 - 15.5 %   Platelets 251.0 150.0 - 400.0 K/uL   Neutrophils Relative % 67.2 43.0 - 77.0 %   Lymphocytes Relative 21.8 12.0 - 46.0 %   Monocytes Relative 8.6 3.0 - 12.0 %   Eosinophils Relative 1.8 0.0 - 5.0 %   Basophils Relative 0.6  0.0 - 3.0 %   Neutro Abs 4.8 1.4 - 7.7 K/uL   Lymphs Abs 1.5 0.7 - 4.0 K/uL   Monocytes Absolute 0.6 0.1 - 1.0 K/uL   Eosinophils Absolute 0.1 0.0 - 0.7 K/uL   Basophils Absolute 0.0 0.0 - 0.1 K/uL  Comprehensive metabolic panel     Status: Abnormal   Collection Time: 06/28/18  7:58 AM  Result Value Ref Range   Sodium 139 135 - 145 mEq/L   Potassium 4.6 3.5 - 5.1 mEq/L   Chloride 102 96 - 112 mEq/L   CO2 29 19 - 32 mEq/L   Glucose, Bld 115 (H) 70 - 99 mg/dL   BUN 21 6 - 23 mg/dL   Creatinine, Ser 1.47 0.40 - 1.50 mg/dL   Total Bilirubin 0.8 0.2 -  1.2 mg/dL   Alkaline Phosphatase 45 39 - 117 U/L   AST 20 0 - 37 U/L   ALT 18 0 - 53 U/L   Total Protein 7.2 6.0 - 8.3 g/dL   Albumin 4.3 3.5 - 5.2 g/dL   Calcium 9.7 8.4 - 10.5 mg/dL   GFR 46.32 (L) >60.00 mL/min   Objective  Body mass index is 26.73 kg/m. Wt Readings from Last 3 Encounters:  07/02/18 219 lb 9.6 oz (99.6 kg)  06/29/18 217 lb (98.4 kg)  05/11/18 219 lb 12.8 oz (99.7 kg)   Temp Readings from Last 3 Encounters:  07/02/18 97.7 F (36.5 C) (Oral)  05/11/18 (!) 97.4 F (36.3 C) (Oral)  03/09/18 98.1 F (36.7 C) (Oral)   BP Readings from Last 3 Encounters:  07/02/18 136/64  06/29/18 138/78  05/11/18 112/62   Pulse Readings from Last 3 Encounters:  07/02/18 (!) 59  06/29/18 (!) 59  05/11/18 72    Physical Exam Vitals signs and nursing note reviewed.  Constitutional:      Appearance: Normal appearance. He is well-developed and well-groomed.  HENT:     Head: Normocephalic and atraumatic.     Nose: Nose normal.     Mouth/Throat:     Mouth: Mucous membranes are moist.     Pharynx: Oropharynx is clear.  Eyes:     Conjunctiva/sclera: Conjunctivae normal.     Pupils: Pupils are equal, round, and reactive to light.  Cardiovascular:     Rate and Rhythm: Normal rate and regular rhythm.     Heart sounds: Normal heart sounds.  Pulmonary:     Effort: Pulmonary effort is normal.     Breath sounds: Normal breath sounds.  Skin:    General: Skin is warm and dry.       Neurological:     General: No focal deficit present.     Mental Status: He is alert and oriented to person, place, and time. Mental status is at baseline.     Gait: Gait normal.  Psychiatric:        Attention and Perception: Attention and perception normal.        Mood and Affect: Mood and affect normal.        Behavior: Behavior normal. Behavior is cooperative.        Thought Content: Thought content normal.        Cognition and Memory: Cognition and memory normal.        Judgment: Judgment  normal.     Assessment   1. HLD/CAS, CAD, RAS 2. Asbestos/chronic bronchitis/bronchiectasis  3. HM Plan   1. Cont statin  F/u Dr. Lucky Cowboy in 1 year  Reviewed alarm  signs of stroke  2. F/u pul Dr. Raul Del 07/13/2018  3.  Flu shot had 12/31/17  pna 23 utd prevnar utd  Consider Tdap and shingrix in future if not had  -not had rec  Hep C neg 10/07/13  Former smoker since age 41 y.o to 9 max 2ppd  Consider cologuard in future no FH colon cancer ? Date last colonoscopy cant see -sent cologaurd today  PSA  normal   Cards-Dr K  Eye [place] [facility]  VVS Dr. Lucky Cowboy   [facility] f/u Dr. Candiss Norse  -BP 150/70 CKD 3 2/2 HTN, RAS continue f/u with Dr. Lucky Cowboy post CEA survelliance, not on ACEI/ARB 2/2 hyperK no med changes BP slightly elevated but tolerable at this visit  F/u in 6 months  Provider: Dr. Olivia Mackie McLean-Scocuzza-Internal Medicine

## 2018-07-09 ENCOUNTER — Other Ambulatory Visit: Payer: Self-pay | Admitting: Family Medicine

## 2018-07-09 DIAGNOSIS — K219 Gastro-esophageal reflux disease without esophagitis: Secondary | ICD-10-CM

## 2018-07-12 DIAGNOSIS — Z1212 Encounter for screening for malignant neoplasm of rectum: Secondary | ICD-10-CM | POA: Diagnosis not present

## 2018-07-12 DIAGNOSIS — Z1211 Encounter for screening for malignant neoplasm of colon: Secondary | ICD-10-CM | POA: Diagnosis not present

## 2018-07-12 LAB — COLOGUARD: Cologuard: NEGATIVE

## 2018-07-14 DIAGNOSIS — N183 Chronic kidney disease, stage 3 (moderate): Secondary | ICD-10-CM | POA: Diagnosis not present

## 2018-07-14 DIAGNOSIS — I701 Atherosclerosis of renal artery: Secondary | ICD-10-CM | POA: Diagnosis not present

## 2018-07-14 DIAGNOSIS — I129 Hypertensive chronic kidney disease with stage 1 through stage 4 chronic kidney disease, or unspecified chronic kidney disease: Secondary | ICD-10-CM | POA: Diagnosis not present

## 2018-07-14 DIAGNOSIS — E875 Hyperkalemia: Secondary | ICD-10-CM | POA: Diagnosis not present

## 2018-07-15 ENCOUNTER — Other Ambulatory Visit: Payer: Medicare Other

## 2018-07-16 ENCOUNTER — Telehealth: Payer: Self-pay | Admitting: Internal Medicine

## 2018-07-16 ENCOUNTER — Encounter: Payer: Self-pay | Admitting: Internal Medicine

## 2018-07-16 NOTE — Telephone Encounter (Signed)
cologuard negative repeat in 3 years   [place]

## 2018-07-16 NOTE — Telephone Encounter (Signed)
Patient was informed of results.  Patient understood and no questions, comments, or concerns at this time.  

## 2018-07-22 ENCOUNTER — Telehealth: Payer: Self-pay | Admitting: Internal Medicine

## 2018-07-22 ENCOUNTER — Encounter: Payer: Medicare Other | Admitting: Family Medicine

## 2018-07-22 NOTE — Telephone Encounter (Signed)
Patient was informed.  Patient understood and no questions, comments, or concerns at this time.  

## 2018-07-22 NOTE — Telephone Encounter (Signed)
Spoke with Dr. Lucky Cowboy about the plaque in his arteries  rec he take aspirin 81 mg daily with plavix   Thanks [place]

## 2018-07-22 NOTE — Telephone Encounter (Signed)
-----   Message from Algernon Huxley, MD sent at 07/22/2018  8:41 AM EDT ----- Yes I think ASA and Plavix would be a good idea.  His disease is not severe at this point, but with both renal and carotid disease I think dual anti-platelets are a good idea on him.   Thanks  Corene Cornea  ----- Message ----- From: McLean-Scocuzza, Nino Glow, MD Sent: 07/21/2018   4:29 PM EDT To: Algernon Huxley, MD  CAS, atherosclerosis  Should this patient be on plavix and aspirin 81 mg daily ?  Im reading on up to date and states aspirin an all patients with carotid artery stenosis    Thanks TMS

## 2018-07-26 DIAGNOSIS — J849 Interstitial pulmonary disease, unspecified: Secondary | ICD-10-CM | POA: Diagnosis not present

## 2018-07-26 DIAGNOSIS — R0602 Shortness of breath: Secondary | ICD-10-CM | POA: Diagnosis not present

## 2018-07-26 DIAGNOSIS — R05 Cough: Secondary | ICD-10-CM | POA: Diagnosis not present

## 2018-08-18 ENCOUNTER — Other Ambulatory Visit: Payer: Self-pay | Admitting: Family Medicine

## 2018-08-18 ENCOUNTER — Encounter: Payer: Self-pay | Admitting: Internal Medicine

## 2018-08-18 DIAGNOSIS — K219 Gastro-esophageal reflux disease without esophagitis: Secondary | ICD-10-CM

## 2018-08-19 ENCOUNTER — Other Ambulatory Visit: Payer: Self-pay | Admitting: Internal Medicine

## 2018-08-19 DIAGNOSIS — E7800 Pure hypercholesterolemia, unspecified: Secondary | ICD-10-CM

## 2018-08-19 DIAGNOSIS — I6529 Occlusion and stenosis of unspecified carotid artery: Secondary | ICD-10-CM

## 2018-08-19 DIAGNOSIS — Z951 Presence of aortocoronary bypass graft: Secondary | ICD-10-CM

## 2018-08-19 DIAGNOSIS — J440 Chronic obstructive pulmonary disease with acute lower respiratory infection: Secondary | ICD-10-CM

## 2018-08-19 DIAGNOSIS — I10 Essential (primary) hypertension: Secondary | ICD-10-CM

## 2018-08-19 DIAGNOSIS — K219 Gastro-esophageal reflux disease without esophagitis: Secondary | ICD-10-CM

## 2018-08-19 DIAGNOSIS — J4530 Mild persistent asthma, uncomplicated: Secondary | ICD-10-CM

## 2018-08-19 DIAGNOSIS — I701 Atherosclerosis of renal artery: Secondary | ICD-10-CM

## 2018-08-19 DIAGNOSIS — Z8673 Personal history of transient ischemic attack (TIA), and cerebral infarction without residual deficits: Secondary | ICD-10-CM

## 2018-08-19 DIAGNOSIS — E785 Hyperlipidemia, unspecified: Secondary | ICD-10-CM

## 2018-08-19 DIAGNOSIS — J209 Acute bronchitis, unspecified: Secondary | ICD-10-CM

## 2018-08-19 MED ORDER — SIMVASTATIN 40 MG PO TABS: 40.0000 mg | ORAL_TABLET | Freq: Every day | ORAL | 3 refills | Status: DC

## 2018-08-19 MED ORDER — METOPROLOL TARTRATE 50 MG PO TABS: 50.0000 mg | ORAL_TABLET | Freq: Two times a day (BID) | ORAL | 3 refills | Status: DC

## 2018-08-19 MED ORDER — ALBUTEROL SULFATE HFA 108 (90 BASE) MCG/ACT IN AERS: 1.0000 | INHALATION_SPRAY | RESPIRATORY_TRACT | 12 refills | Status: DC | PRN

## 2018-08-19 MED ORDER — CLOPIDOGREL BISULFATE 75 MG PO TABS: 75.0000 mg | ORAL_TABLET | Freq: Every day | ORAL | 3 refills | Status: DC

## 2018-08-19 MED ORDER — FEXOFENADINE HCL 180 MG PO TABS: 180.0000 mg | ORAL_TABLET | Freq: Every day | ORAL | 3 refills | Status: DC | PRN

## 2018-08-19 MED ORDER — PANTOPRAZOLE SODIUM 40 MG PO TBEC: 40.0000 mg | DELAYED_RELEASE_TABLET | Freq: Every day | ORAL | 3 refills | Status: DC

## 2018-08-19 MED ORDER — FLUTICASONE PROPIONATE HFA 110 MCG/ACT IN AERO: 2.0000 | INHALATION_SPRAY | Freq: Two times a day (BID) | RESPIRATORY_TRACT | 12 refills | Status: DC

## 2018-08-30 ENCOUNTER — Other Ambulatory Visit: Payer: Self-pay | Admitting: Specialist

## 2018-08-30 DIAGNOSIS — R0602 Shortness of breath: Secondary | ICD-10-CM

## 2018-08-30 DIAGNOSIS — J849 Interstitial pulmonary disease, unspecified: Secondary | ICD-10-CM

## 2018-09-02 DIAGNOSIS — I2510 Atherosclerotic heart disease of native coronary artery without angina pectoris: Secondary | ICD-10-CM | POA: Diagnosis not present

## 2018-09-02 DIAGNOSIS — I10 Essential (primary) hypertension: Secondary | ICD-10-CM | POA: Diagnosis not present

## 2018-09-02 DIAGNOSIS — I6523 Occlusion and stenosis of bilateral carotid arteries: Secondary | ICD-10-CM | POA: Diagnosis not present

## 2018-09-07 DIAGNOSIS — J439 Emphysema, unspecified: Secondary | ICD-10-CM | POA: Insufficient documentation

## 2018-09-30 DIAGNOSIS — I10 Essential (primary) hypertension: Secondary | ICD-10-CM | POA: Diagnosis not present

## 2018-09-30 DIAGNOSIS — I2510 Atherosclerotic heart disease of native coronary artery without angina pectoris: Secondary | ICD-10-CM | POA: Diagnosis not present

## 2018-09-30 DIAGNOSIS — E782 Mixed hyperlipidemia: Secondary | ICD-10-CM | POA: Diagnosis not present

## 2018-10-26 ENCOUNTER — Ambulatory Visit
Admission: RE | Admit: 2018-10-26 | Discharge: 2018-10-26 | Disposition: A | Discharge status: Home / Self Care | Payer: Medicare Other | Source: Ambulatory Visit | Attending: Specialist | Admitting: Specialist

## 2018-10-26 ENCOUNTER — Other Ambulatory Visit: Payer: Self-pay

## 2018-10-26 DIAGNOSIS — J849 Interstitial pulmonary disease, unspecified: Secondary | ICD-10-CM | POA: Diagnosis not present

## 2018-10-26 DIAGNOSIS — R0602 Shortness of breath: Secondary | ICD-10-CM | POA: Insufficient documentation

## 2018-10-26 DIAGNOSIS — R918 Other nonspecific abnormal finding of lung field: Secondary | ICD-10-CM | POA: Diagnosis not present

## 2018-10-28 DIAGNOSIS — R590 Localized enlarged lymph nodes: Secondary | ICD-10-CM | POA: Diagnosis not present

## 2018-10-28 DIAGNOSIS — J449 Chronic obstructive pulmonary disease, unspecified: Secondary | ICD-10-CM | POA: Diagnosis not present

## 2018-10-28 DIAGNOSIS — Z87898 Personal history of other specified conditions: Secondary | ICD-10-CM | POA: Diagnosis not present

## 2018-10-28 DIAGNOSIS — R05 Cough: Secondary | ICD-10-CM | POA: Diagnosis not present

## 2019-01-04 ENCOUNTER — Ambulatory Visit: Payer: Medicare Other | Admitting: Internal Medicine

## 2019-01-14 DIAGNOSIS — N183 Chronic kidney disease, stage 3 unspecified: Secondary | ICD-10-CM | POA: Insufficient documentation

## 2019-01-14 DIAGNOSIS — I10 Essential (primary) hypertension: Secondary | ICD-10-CM | POA: Insufficient documentation

## 2019-01-18 DIAGNOSIS — E875 Hyperkalemia: Secondary | ICD-10-CM | POA: Diagnosis not present

## 2019-01-18 DIAGNOSIS — N183 Chronic kidney disease, stage 3 (moderate): Secondary | ICD-10-CM | POA: Diagnosis not present

## 2019-01-18 DIAGNOSIS — I701 Atherosclerosis of renal artery: Secondary | ICD-10-CM | POA: Diagnosis not present

## 2019-01-18 DIAGNOSIS — I10 Essential (primary) hypertension: Secondary | ICD-10-CM | POA: Diagnosis not present

## 2019-01-20 ENCOUNTER — Ambulatory Visit (INDEPENDENT_AMBULATORY_CARE_PROVIDER_SITE_OTHER): Payer: Medicare Other | Admitting: Internal Medicine

## 2019-01-20 ENCOUNTER — Other Ambulatory Visit: Payer: Self-pay

## 2019-01-20 ENCOUNTER — Encounter: Payer: Self-pay | Admitting: Internal Medicine

## 2019-01-20 VITALS — BP 140/70 | HR 57 | Ht 74.0 in | Wt 223.0 lb

## 2019-01-20 DIAGNOSIS — I10 Essential (primary) hypertension: Secondary | ICD-10-CM

## 2019-01-20 DIAGNOSIS — I701 Atherosclerosis of renal artery: Secondary | ICD-10-CM | POA: Diagnosis not present

## 2019-01-20 NOTE — Progress Notes (Signed)
Virtual Visit via Video Note  I connected with Keith Cruz  on 01/20/19 at  9:30 AM EDT by a video enabled telemedicine application and verified that I am speaking with the correct person using two identifiers.  Location patient: home Location provider:work or home office Persons participating in the virtual visit: patient, provider, pts wife   I discussed the limitations of evaluation and management by telemedicine and the availability of in person appointments. The patient expressed understanding and agreed to proceed.   HPI: 1. HTN BP 120/74 when saw renal 01/18/19 on metoprolol 50 mg bid unable to take ACEI/ARB due to hyperK, does not want to be on fluid pill, CCB allergy  BP today 140/70 HR 57 sbp range 117/45  Reviewed labs cmet stable GFR, CBC normal UA normal 01/18/19   ROS: See pertinent positives and negatives per HPI.  Past Medical History:  Diagnosis Date  . Arthritis    back  . Asthma   . Atherosclerosis of aorta ([place])   . Atherosclerosis of aorta ([place])   . Bronchitis 05/2017   chronic  . CAD (coronary artery disease)   . Carotid artery disease ([place])   . Carotid stenosis   . Carotid stenosis, bilateral   . Chronic bronchitis ([place])   . Chronic cough   . COPD (chronic obstructive pulmonary disease) ([place])   . Dysrhythmia   . GERD (gastroesophageal reflux disease)   . Heart murmur   . HOH (hard of hearing)    hearing aids  . Hypercholesteremia   . Hypertension   . Kidney disease    stent  . Macular degeneration of left eye   . Myocardial infarction ([place]) 2016  . Pre-diabetes   . Seasonal allergies   . Seizure ([place])   . Seizures ([facility])    viral meningitis  . Stroke [facility])    1991 and imaging 05/12/15 old strokes  . TIA (transient ischemic attack) 1991   x2  . Uses hearing aid   . Viral meningitis 2017  . Viral meningitis   . Wears dentures    upper and lower    Past Surgical History:  Procedure Laterality Date  . CAROTID ENDARTERECTOMY Left 2014  .  CAROTID ENDARTERECTOMY Right 1991  . CATARACT EXTRACTION W/PHACO Left 06/24/2017   Procedure: CATARACT EXTRACTION PHACO AND INTRAOCULAR LENS PLACEMENT ([place]) LEFT BORDERLINE DIABETIC;  Surgeon: Leandrew Koyanagi, MD;  Location: [place];  Service: Ophthalmology;  Laterality: Left;  . CATARACT EXTRACTION W/PHACO Right 07/22/2017   Procedure: CATARACT EXTRACTION PHACO AND INTRAOCULAR LENS PLACEMENT ([place]) RIGHT BORDERLINE DIABETIC;  Surgeon: Leandrew Koyanagi, MD;  Location: [place];  Service: Ophthalmology;  Laterality: Right;  . CORONARY ARTERY BYPASS GRAFT     x5  . SKIN LESION EXCISION Left    left ear  . URETERAL STENT PLACEMENT      Family History  Problem Relation Age of Onset  . Stroke Mother   . Heart attack Mother   . Stroke Father   . Stroke Paternal Grandmother   . Stroke Paternal Grandfather   . Alzheimer's disease Paternal Grandfather   . Stroke Sister   . Alzheimer's disease Sister   . Stroke Brother   . Alzheimer's disease Maternal Grandmother     SOCIAL HX: lives with wife    Current Outpatient Medications:  .  albuterol (VENTOLIN HFA) 108 (90 Base) MCG/ACT inhaler, Inhale 1-2 puffs into the lungs every 4 (four) hours as needed for wheezing or shortness of breath., Disp: 1  Inhaler, Rfl: 12 .  aspirin EC 81 MG tablet, Take 81 mg by mouth daily., Disp: , Rfl:  .  Cholecalciferol (VITAMIN D3) 2000 UNITS capsule, Take 2,000 Units by mouth daily. am, Disp: , Rfl:  .  clopidogrel (PLAVIX) 75 MG tablet, Take 1 tablet (75 mg total) by mouth daily., Disp: 90 tablet, Rfl: 3 .  fexofenadine (ALLEGRA) 180 MG tablet, Take 1 tablet (180 mg total) by mouth daily as needed for allergies or rhinitis., Disp: 90 tablet, Rfl: 3 .  fluticasone (FLOVENT HFA) 110 MCG/ACT inhaler, Inhale 2 puffs into the lungs 2 (two) times daily. Rinse your mouth, Disp: 1 Inhaler, Rfl: 12 .  folic acid (FOLVITE) 1 MG tablet, Take 0.8 mg by mouth daily. am, Disp: , Rfl:  .   glucose blood (ACCU-CHEK AVIVA PLUS) test strip, Check blood sugar 1 x daily, Disp: 100 each, Rfl: 5 .  metoprolol tartrate (LOPRESSOR) 50 MG tablet, Take 1 tablet (50 mg total) by mouth 2 (two) times daily., Disp: 180 tablet, Rfl: 3 .  Multiple Vitamins-Minerals (CENTRUM SILVER 50+MEN PO), Take by mouth., Disp: , Rfl:  .  pantoprazole (PROTONIX) 40 MG tablet, Take 1 tablet (40 mg total) by mouth daily. 30 minutes before a meal, Disp: 90 tablet, Rfl: 3 .  simvastatin (ZOCOR) 40 MG tablet, Take 1 tablet (40 mg total) by mouth daily. am, Disp: 90 tablet, Rfl: 3  Current Facility-Administered Medications:  .  ipratropium-albuterol (DUONEB) 0.5-2.5 (3) MG/3ML nebulizer solution 3 mL, 3 mL, Nebulization, Once, Karamalegos, Devonne Doughty, DO  EXAM:  VITALS per patient if applicable:  GENERAL: alert, oriented, appears well and in no acute distress  HEENT: atraumatic, conjunttiva clear, no obvious abnormalities on inspection of external nose and ears  NECK: normal movements of the head and neck  LUNGS: on inspection no signs of respiratory distress, breathing rate appears normal, no obvious gross SOB, gasping or wheezing  CV: no obvious cyanosis  MS: moves all visible extremities without noticeable abnormality  PSYCH/NEURO: pleasant and cooperative, no obvious depression or anxiety, speech and thought processing grossly intact  ASSESSMENT AND PLAN:  Discussed the following assessment and plan:  Benign essential HTN  -cont Metoprolol if continues to be elevated consider change to coreg  See HPI  rec dash eating/low salt  Call back in 2 weeks with BP readin g   HM Flu shot utd pna 23 utd prevnar utd  Tdap utd Consider shingrix in future if not had   Hep C neg 10/07/13  Former smoker since age 54 y.o to 46 max 2ppd  cologaurd 07/12/18 neg PSA normal 06/28/18 0.38   Cards-Dr K  Eye [place] eye  VVS Dr. Lucky Cowboy  [facility] f/u Dr. Candiss Norse renal     -we discussed possible serious  and likely etiologies, options for evaluation and workup, limitations of telemedicine visit vs in person visit, treatment, treatment risks and precautions. Pt prefers to treat via telemedicine empirically rather then risking or undertaking an in person visit at this moment. Patient agrees to seek prompt in person care if worsening, new symptoms arise, or if is not improving with treatment.   I discussed the assessment and treatment plan with the patient. The patient was provided an opportunity to ask questions and all were answered. The patient agreed with the plan and demonstrated an understanding of the instructions.   The patient was advised to call back or seek an in-person evaluation if the symptoms worsen or if the condition fails to improve as  anticipated.  15 minutes  Delorise Jackson, MD

## 2019-01-20 NOTE — Patient Instructions (Signed)
Low-Sodium Eating Plan Sodium, which is an element that makes up salt, helps you maintain a healthy balance of fluids in your body. Too much sodium can increase your blood pressure and cause fluid and waste to be held in your body. Your health care provider or dietitian may recommend following this plan if you have high blood pressure (hypertension), kidney disease, liver disease, or heart failure. Eating less sodium can help lower your blood pressure, reduce swelling, and protect your heart, liver, and kidneys. What are tips for following this plan? General guidelines  Most people on this plan should limit their sodium intake to 1,500-2,000 mg (milligrams) of sodium each day. Reading food labels   The Nutrition Facts label lists the amount of sodium in one serving of the food. If you eat more than one serving, you must multiply the listed amount of sodium by the number of servings.  Choose foods with less than 140 mg of sodium per serving.  Avoid foods with 300 mg of sodium or more per serving. Shopping  Look for lower-sodium products, often labeled as "low-sodium" or "no salt added."  Always check the sodium content even if foods are labeled as "unsalted" or "no salt added".  Buy fresh foods. ? Avoid canned foods and premade or frozen meals. ? Avoid canned, cured, or processed meats  Buy breads that have less than 80 mg of sodium per slice. Cooking  Eat more home-cooked food and less restaurant, buffet, and fast food.  Avoid adding salt when cooking. Use salt-free seasonings or herbs instead of table salt or sea salt. Check with your health care provider or pharmacist before using salt substitutes.  Cook with plant-based oils, such as canola, sunflower, or olive oil. Meal planning  When eating at a restaurant, ask that your food be prepared with less salt or no salt, if possible.  Avoid foods that contain MSG (monosodium glutamate). MSG is sometimes added to Chinese food,  bouillon, and some canned foods. What foods are recommended? The items listed may not be a complete list. Talk with your dietitian about what dietary choices are best for you. Grains Low-sodium cereals, including oats, puffed wheat and rice, and shredded wheat. Low-sodium crackers. Unsalted rice. Unsalted pasta. Low-sodium bread. Whole-grain breads and whole-grain pasta. Vegetables Fresh or frozen vegetables. "No salt added" canned vegetables. "No salt added" tomato sauce and paste. Low-sodium or reduced-sodium tomato and vegetable juice. Fruits Fresh, frozen, or canned fruit. Fruit juice. Meats and other protein foods Fresh or frozen (no salt added) meat, poultry, seafood, and fish. Low-sodium canned tuna and salmon. Unsalted nuts. Dried peas, beans, and lentils without added salt. Unsalted canned beans. Eggs. Unsalted nut butters. Dairy Milk. Soy milk. Cheese that is naturally low in sodium, such as ricotta cheese, fresh mozzarella, or Swiss cheese Low-sodium or reduced-sodium cheese. Cream cheese. Yogurt. Fats and oils Unsalted butter. Unsalted margarine with no trans fat. Vegetable oils such as canola or olive oils. Seasonings and other foods Fresh and dried herbs and spices. Salt-free seasonings. Low-sodium mustard and ketchup. Sodium-free salad dressing. Sodium-free light mayonnaise. Fresh or refrigerated horseradish. Lemon juice. Vinegar. Homemade, reduced-sodium, or low-sodium soups. Unsalted popcorn and pretzels. Low-salt or salt-free chips. What foods are not recommended? The items listed may not be a complete list. Talk with your dietitian about what dietary choices are best for you. Grains Instant hot cereals. Bread stuffing, pancake, and biscuit mixes. Croutons. Seasoned rice or pasta mixes. Noodle soup cups. Boxed or frozen macaroni and cheese. Regular salted   crackers. Self-rising flour. Vegetables Sauerkraut, pickled vegetables, and relishes. Olives. [place] fries. Onion rings.  Regular canned vegetables (not low-sodium or reduced-sodium). Regular canned tomato sauce and paste (not low-sodium or reduced-sodium). Regular tomato and vegetable juice (not low-sodium or reduced-sodium). Frozen vegetables in sauces. Meats and other protein foods Meat or fish that is salted, canned, smoked, spiced, or pickled. Bacon, ham, sausage, hotdogs, corned beef, chipped beef, packaged lunch meats, salt pork, jerky, pickled herring, anchovies, regular canned tuna, sardines, salted nuts. Dairy Processed cheese and cheese spreads. Cheese curds. Blue cheese. Feta cheese. String cheese. Regular cottage cheese. Buttermilk. Canned milk. Fats and oils Salted butter. Regular margarine. Ghee. Bacon fat. Seasonings and other foods Onion salt, garlic salt, seasoned salt, table salt, and sea salt. Canned and packaged gravies. Worcestershire sauce. Tartar sauce. Barbecue sauce. Teriyaki sauce. Soy sauce, including reduced-sodium. Steak sauce. Fish sauce. Oyster sauce. Cocktail sauce. Horseradish that you find on the shelf. Regular ketchup and mustard. Meat flavorings and tenderizers. Bouillon cubes. Hot sauce and Tabasco sauce. Premade or packaged marinades. Premade or packaged taco seasonings. Relishes. Regular salad dressings. Salsa. Potato and tortilla chips. Corn chips and puffs. Salted popcorn and pretzels. Canned or dried soups. Pizza. Frozen entrees and pot pies. Summary  Eating less sodium can help lower your blood pressure, reduce swelling, and protect your heart, liver, and kidneys.  Most people on this plan should limit their sodium intake to 1,500-2,000 mg (milligrams) of sodium each day.  Canned, boxed, and frozen foods are high in sodium. Restaurant foods, fast foods, and pizza are also very high in sodium. You also get sodium by adding salt to food.  Try to cook at home, eat more fresh fruits and vegetables, and eat less fast food, canned, processed, or prepared foods. This information is  not intended to replace advice given to you by your health care provider. Make sure you discuss any questions you have with your health care provider. Document Released: 10/04/2001 Document Revised: 03/27/2017 Document Reviewed: 04/07/2016 Elsevier Patient Education  2020 [place] DASH stands for "Dietary Approaches to Stop Hypertension." The DASH eating plan is a healthy eating plan that has been shown to reduce high blood pressure (hypertension). It may also reduce your risk for type 2 diabetes, heart disease, and stroke. The DASH eating plan may also help with weight loss. What are tips for following this plan?  General guidelines  Avoid eating more than 2,300 mg (milligrams) of salt (sodium) a day. If you have hypertension, you may need to reduce your sodium intake to 1,500 mg a day.  Limit alcohol intake to no more than 1 drink a day for nonpregnant women and 2 drinks a day for men. One drink equals 12 oz of beer, 5 oz of wine, or 1 oz of hard liquor.  Work with your health care provider to maintain a healthy body weight or to lose weight. Ask what an ideal weight is for you.  Get at least 30 minutes of exercise that causes your heart to beat faster (aerobic exercise) most days of the week. Activities may include walking, swimming, or biking.  Work with your health care provider or diet and nutrition specialist (dietitian) to adjust your eating plan to your individual calorie needs. Reading food labels   Check food labels for the amount of sodium per serving. Choose foods with less than 5 percent of the Daily Value of sodium. Generally, foods with less than 300 mg of sodium per serving fit into  this eating plan.  To find whole grains, look for the word "whole" as the first word in the ingredient list. Shopping  Buy products labeled as "low-sodium" or "no salt added."  Buy fresh foods. Avoid canned foods and premade or frozen meals. Cooking  Avoid adding  salt when cooking. Use salt-free seasonings or herbs instead of table salt or sea salt. Check with your health care provider or pharmacist before using salt substitutes.  Do not fry foods. Cook foods using healthy methods such as baking, boiling, grilling, and broiling instead.  Cook with heart-healthy oils, such as olive, canola, soybean, or sunflower oil. Meal planning  Eat a balanced diet that includes: ? 5 or more servings of fruits and vegetables each day. At each meal, try to fill half of your plate with fruits and vegetables. ? Up to 6-8 servings of whole grains each day. ? Less than 6 oz of lean meat, poultry, or fish each day. A 3-oz serving of meat is about the same size as a deck of cards. One egg equals 1 oz. ? 2 servings of low-fat dairy each day. ? A serving of nuts, seeds, or beans 5 times each week. ? Heart-healthy fats. Healthy fats called Omega-3 fatty acids are found in foods such as flaxseeds and coldwater fish, like sardines, salmon, and mackerel.  Limit how much you eat of the following: ? Canned or prepackaged foods. ? Food that is high in trans fat, such as fried foods. ? Food that is high in saturated fat, such as fatty meat. ? Sweets, desserts, sugary drinks, and other foods with added sugar. ? Full-fat dairy products.  Do not salt foods before eating.  Try to eat at least 2 vegetarian meals each week.  Eat more home-cooked food and less restaurant, buffet, and fast food.  When eating at a restaurant, ask that your food be prepared with less salt or no salt, if possible. What foods are recommended? The items listed may not be a complete list. Talk with your dietitian about what dietary choices are best for you. Grains Whole-grain or whole-wheat bread. Whole-grain or whole-wheat pasta. Brown rice. [place]. Bulgur. Whole-grain and low-sodium cereals. Pita bread. Low-fat, low-sodium crackers. Whole-wheat flour tortillas. Vegetables Fresh or frozen  vegetables (raw, steamed, roasted, or grilled). Low-sodium or reduced-sodium tomato and vegetable juice. Low-sodium or reduced-sodium tomato sauce and tomato paste. Low-sodium or reduced-sodium canned vegetables. Fruits All fresh, dried, or frozen fruit. Canned fruit in natural juice (without added sugar). Meat and other protein foods Skinless chicken or [place]. Ground chicken or [place]. Pork with fat trimmed off. Fish and seafood. Egg whites. Dried beans, peas, or lentils. Unsalted nuts, nut butters, and seeds. Unsalted canned beans. Lean cuts of beef with fat trimmed off. Low-sodium, lean deli meat. Dairy Low-fat (1%) or fat-free (skim) milk. Fat-free, low-fat, or reduced-fat cheeses. Nonfat, low-sodium ricotta or cottage cheese. Low-fat or nonfat yogurt. Low-fat, low-sodium cheese. Fats and oils Soft margarine without trans fats. Vegetable oil. Low-fat, reduced-fat, or light mayonnaise and salad dressings (reduced-sodium). Canola, safflower, olive, soybean, and sunflower oils. Avocado. Seasoning and other foods Herbs. Spices. Seasoning mixes without salt. Unsalted popcorn and pretzels. Fat-free sweets. What foods are not recommended? The items listed may not be a complete list. Talk with your dietitian about what dietary choices are best for you. Grains Baked goods made with fat, such as croissants, muffins, or some breads. Dry pasta or rice meal packs. Vegetables Creamed or fried vegetables. Vegetables in a cheese sauce.  Regular canned vegetables (not low-sodium or reduced-sodium). Regular canned tomato sauce and paste (not low-sodium or reduced-sodium). Regular tomato and vegetable juice (not low-sodium or reduced-sodium). Angie Fava. Olives. Fruits Canned fruit in a light or heavy syrup. Fried fruit. Fruit in cream or butter sauce. Meat and other protein foods Fatty cuts of meat. Ribs. Fried meat. Berniece Salines. Sausage. Bologna and other processed lunch meats. Salami. Fatback. Hotdogs. Bratwurst.  Salted nuts and seeds. Canned beans with added salt. Canned or smoked fish. Whole eggs or egg yolks. Chicken or [place] with skin. Dairy Whole or 2% milk, cream, and half-and-half. Whole or full-fat cream cheese. Whole-fat or sweetened yogurt. Full-fat cheese. Nondairy creamers. Whipped toppings. Processed cheese and cheese spreads. Fats and oils Butter. Stick margarine. Lard. Shortening. Ghee. Bacon fat. Tropical oils, such as coconut, palm kernel, or palm oil. Seasoning and other foods Salted popcorn and pretzels. Onion salt, garlic salt, seasoned salt, table salt, and sea salt. Worcestershire sauce. Tartar sauce. Barbecue sauce. Teriyaki sauce. Soy sauce, including reduced-sodium. Steak sauce. Canned and packaged gravies. Fish sauce. Oyster sauce. Cocktail sauce. Horseradish that you find on the shelf. Ketchup. Mustard. Meat flavorings and tenderizers. Bouillon cubes. Hot sauce and Tabasco sauce. Premade or packaged marinades. Premade or packaged taco seasonings. Relishes. Regular salad dressings. Where to find more information:  National Heart, Lung, and [place]: https://wilson-eaton.com/  American Heart Association: www.heart.org Summary  The DASH eating plan is a healthy eating plan that has been shown to reduce high blood pressure (hypertension). It may also reduce your risk for type 2 diabetes, heart disease, and stroke.  With the DASH eating plan, you should limit salt (sodium) intake to 2,300 mg a day. If you have hypertension, you may need to reduce your sodium intake to 1,500 mg a day.  When on the DASH eating plan, aim to eat more fresh fruits and vegetables, whole grains, lean proteins, low-fat dairy, and heart-healthy fats.  Work with your health care provider or diet and nutrition specialist (dietitian) to adjust your eating plan to your individual calorie needs. This information is not intended to replace advice given to you by your health care provider. Make sure you discuss any  questions you have with your health care provider. Document Released: 04/03/2011 Document Revised: 03/27/2017 Document Reviewed: 04/07/2016 Elsevier Patient Education  2020 [place].  High Cholesterol  High cholesterol is a condition in which the blood has high levels of a white, waxy, fat-like substance (cholesterol). The human body needs small amounts of cholesterol. The liver makes all the cholesterol that the body needs. Extra (excess) cholesterol comes from the food that we eat. Cholesterol is carried from the liver by the blood through the blood vessels. If you have high cholesterol, deposits (plaques) may build up on the walls of your blood vessels (arteries). Plaques make the arteries narrower and stiffer. Cholesterol plaques increase your risk for heart attack and stroke. Work with your health care provider to keep your cholesterol levels in a healthy range. What increases the risk? This condition is more likely to develop in people who:  Eat foods that are high in animal fat (saturated fat) or cholesterol.  Are overweight.  Are not getting enough exercise.  Have a family history of high cholesterol. What are the signs or symptoms? There are no symptoms of this condition. How is this diagnosed? This condition may be diagnosed from the results of a blood test.  If you are older than age 18, your health care provider may check your  cholesterol every 4-6 years.  You may be checked more often if you already have high cholesterol or other risk factors for heart disease. The blood test for cholesterol measures:  "Bad" cholesterol (LDL cholesterol). This is the main type of cholesterol that causes heart disease. The desired level for LDL is less than 100.  "Good" cholesterol (HDL cholesterol). This type helps to protect against heart disease by cleaning the arteries and carrying the LDL away. The desired level for HDL is 60 or higher.  Triglycerides. These are fats that the  body can store or burn for energy. The desired number for triglycerides is lower than 150.  Total cholesterol. This is a measure of the total amount of cholesterol in your blood, including LDL cholesterol, HDL cholesterol, and triglycerides. A healthy number is less than 200. How is this treated? This condition is treated with diet changes, lifestyle changes, and medicines. Diet changes  This may include eating more whole grains, fruits, vegetables, nuts, and fish.  This may also include cutting back on red meat and foods that have a lot of added sugar. Lifestyle changes  Changes may include getting at least 40 minutes of aerobic exercise 3 times a week. Aerobic exercises include walking, biking, and swimming. Aerobic exercise along with a healthy diet can help you maintain a healthy weight.  Changes may also include quitting smoking. Medicines  Medicines are usually given if diet and lifestyle changes have failed to reduce your cholesterol to healthy levels.  Your health care provider may prescribe a statin medicine. Statin medicines have been shown to reduce cholesterol, which can reduce the risk of heart disease. Follow these instructions at home: Eating and drinking If told by your health care provider:  Eat chicken (without skin), fish, veal, shellfish, ground [place] breast, and round or loin cuts of red meat.  Do not eat fried foods or fatty meats, such as hot dogs and salami.  Eat plenty of fruits, such as apples.  Eat plenty of vegetables, such as broccoli, potatoes, and carrots.  Eat beans, peas, and lentils.  Eat grains such as barley, rice, couscous, and bulgur wheat.  Eat pasta without cream sauces.  Use skim or nonfat milk, and eat low-fat or nonfat yogurt and cheeses.  Do not eat or drink whole milk, cream, ice cream, egg yolks, or hard cheeses.  Do not eat stick margarine or tub margarines that contain trans fats (also called partially hydrogenated oils).   Do not eat saturated tropical oils, such as coconut oil and palm oil.  Do not eat cakes, cookies, crackers, or other baked goods that contain trans fats.  General instructions  Exercise as directed by your health care provider. Increase your activity level with activities such as gardening, walking, and taking the stairs.  Take over-the-counter and prescription medicines only as told by your health care provider.  Do not use any products that contain nicotine or tobacco, such as cigarettes and e-cigarettes. If you need help quitting, ask your health care provider.  Keep all follow-up visits as told by your health care provider. This is important. Contact a health care provider if:  You are struggling to maintain a healthy diet or weight.  You need help to start on an exercise program.  You need help to stop smoking. Get help right away if:  You have chest pain.  You have trouble breathing. This information is not intended to replace advice given to you by your health care provider. Make sure you discuss  any questions you have with your health care provider. Document Released: 04/14/2005 Document Revised: 04/17/2017 Document Reviewed: 10/13/2015 Elsevier Patient Education  [place].   Cholesterol Content in Foods Cholesterol is a waxy, fat-like substance that helps to carry fat in the blood. The body needs cholesterol in small amounts, but too much cholesterol can cause damage to the arteries and heart. Most people should eat less than 200 milligrams (mg) of cholesterol a day. Foods with cholesterol  Cholesterol is found in animal-based foods, such as meat, seafood, and dairy. Generally, low-fat dairy and lean meats have less cholesterol than full-fat dairy and fatty meats. The milligrams of cholesterol per serving (mg per serving) of common cholesterol-containing foods are listed below. Meat and other proteins  Egg - one large whole egg has 186 mg.  Veal shank - 4 oz  has 141 mg.  Lean ground [place] (93% lean) - 4 oz has 118 mg.  Fat-trimmed lamb loin - 4 oz has 106 mg.  Lean ground beef (90% lean) - 4 oz has 100 mg.  Lobster - 3.5 oz has 90 mg.  Pork loin chops - 4 oz has 86 mg.  Canned salmon - 3.5 oz has 83 mg.  Fat-trimmed beef top loin - 4 oz has 78 mg.  Frankfurter - 1 frank (3.5 oz) has 77 mg.  Crab - 3.5 oz has 71 mg.  Roasted chicken without skin, white meat - 4 oz has 66 mg.  Light bologna - 2 oz has 45 mg.  Deli-cut [place] - 2 oz has 31 mg.  Canned tuna - 3.5 oz has 31 mg.  Bacon - 1 oz has 29 mg.  Oysters and mussels (raw) - 3.5 oz has 25 mg.  Mackerel - 1 oz has 22 mg.  Trout - 1 oz has 20 mg.  Pork sausage - 1 link (1 oz) has 17 mg.  Salmon - 1 oz has 16 mg.  Tilapia - 1 oz has 14 mg. Dairy  Soft-serve ice cream -  cup (4 oz) has 103 mg.  Whole-milk yogurt - 1 cup (8 oz) has 29 mg.  Cheddar cheese - 1 oz has 28 mg.  American cheese - 1 oz has 28 mg.  Whole milk - 1 cup (8 oz) has 23 mg.  2% milk - 1 cup (8 oz) has 18 mg.  Cream cheese - 1 tablespoon (Tbsp) has 15 mg.  Cottage cheese -  cup (4 oz) has 14 mg.  Low-fat (1%) milk - 1 cup (8 oz) has 10 mg.  Sour cream - 1 Tbsp has 8.5 mg.  Low-fat yogurt - 1 cup (8 oz) has 8 mg.  Nonfat Greek yogurt - 1 cup (8 oz) has 7 mg.  Half-and-half cream - 1 Tbsp has 5 mg. Fats and oils  Cod liver oil - 1 tablespoon (Tbsp) has 82 mg.  Butter - 1 Tbsp has 15 mg.  Lard - 1 Tbsp has 14 mg.  Bacon grease - 1 Tbsp has 14 mg.  Mayonnaise - 1 Tbsp has 5-10 mg.  Margarine - 1 Tbsp has 3-10 mg. Exact amounts of cholesterol in these foods may vary depending on specific ingredients and brands. Foods without cholesterol Most plant-based foods do not have cholesterol unless you combine them with a food that has cholesterol. Foods without cholesterol include:  Grains and cereals.  Vegetables.  Fruits.  Vegetable oils, such as olive, canola, and  sunflower oil.  Legumes, such as peas, beans, and lentils.  Nuts and seeds.  Egg whites. Summary  The body needs cholesterol in small amounts, but too much cholesterol can cause damage to the arteries and heart.  Most people should eat less than 200 milligrams (mg) of cholesterol a day. This information is not intended to replace advice given to you by your health care provider. Make sure you discuss any questions you have with your health care provider. Document Released: 12/09/2016 Document Revised: 03/27/2017 Document Reviewed: 12/09/2016 Elsevier Patient Education  2020 [employer].

## 2019-01-22 ENCOUNTER — Other Ambulatory Visit: Payer: Self-pay | Admitting: Family Medicine

## 2019-01-22 DIAGNOSIS — K219 Gastro-esophageal reflux disease without esophagitis: Secondary | ICD-10-CM

## 2019-02-08 ENCOUNTER — Other Ambulatory Visit: Payer: Self-pay

## 2019-02-08 ENCOUNTER — Ambulatory Visit (INDEPENDENT_AMBULATORY_CARE_PROVIDER_SITE_OTHER): Payer: Medicare Other

## 2019-02-08 VITALS — BP 107/65 | HR 67

## 2019-02-08 DIAGNOSIS — Z0000 Encounter for general adult medical examination without abnormal findings: Secondary | ICD-10-CM

## 2019-02-08 NOTE — Patient Instructions (Signed)
  Keith Cruz , Thank you for taking time to come for your Medicare Wellness Visit. I appreciate your ongoing commitment to your health goals. Please review the following plan we discussed and let me know if I can assist you in the future.   These are the goals we discussed: Goals    . DIET - INCREASE WATER INTAKE      recommend drinking at least 6-8 glasses of water a day        This is a list of the screening recommended for you and due dates:  Health Maintenance  Topic Date Due  . Tetanus Vaccine  07/04/2028  . Flu Shot  Completed  . Pneumonia vaccines  Completed

## 2019-02-08 NOTE — Progress Notes (Signed)
Subjective:   Keith Cruz is a 78 y.o. male who presents for Medicare Annual/Subsequent preventive examination.  Review of Systems:  No ROS.  Medicare Wellness Virtual Visit.  Visual/audio telehealth visit. Vital signs provided per patient.  See social history for additional risk factors.   Cardiac Risk Factors include: advanced age (>36men, >32 women);male gender;hypertension     Objective:    Vitals: BP 107/65 (BP Location: Left Arm, Patient Position: Sitting, Cuff Size: Normal)   Pulse 67   SpO2 94%   There is no height or weight on file to calculate BMI.  Advanced Directives 02/08/2019 01/19/2018 07/22/2017 06/24/2017 12/09/2016 06/24/2016  Does Patient Have a Medical Advance Directive? Yes Yes Yes Yes Yes Yes  Type of Paramedic of [place];Living will Living will;Healthcare Power of [place];Living will [place];Living will [place];Living will [place]  Does patient want to make changes to medical advance directive? No - Patient declined - - - - -  Copy of [place] in Chart? No - copy requested No - copy requested Yes No - copy requested No - copy requested -    Tobacco Social History   Tobacco Use  Smoking Status Former Smoker  . Packs/day: 2.00  . Years: 10.00  . Pack years: 20.00  . Types: Cigarettes  . Quit date: 04/28/1970  . Years since quitting: 51.8  Smokeless Tobacco Former Engineer, structural given: Not Answered   Clinical Intake:  Pre-visit preparation completed: Yes        Diabetes: No  How often do you need to have someone help you when you read instructions, pamphlets, or other written materials from your doctor or pharmacy?: 1 - Never  Interpreter Needed?: No     Past Medical History:  Diagnosis Date  . Arthritis    back  . Asthma   . Atherosclerosis of aorta ([place])   . Atherosclerosis of aorta ([place])    . Bronchitis 05/2017   chronic  . CAD (coronary artery disease)   . Carotid artery disease ([place])   . Carotid stenosis   . Carotid stenosis, bilateral   . Chronic bronchitis ([place])   . Chronic cough   . COPD (chronic obstructive pulmonary disease) ([place])   . Dysrhythmia   . GERD (gastroesophageal reflux disease)   . Heart murmur   . HOH (hard of hearing)    hearing aids  . Hypercholesteremia   . Hypertension   . Kidney disease    stent  . Macular degeneration of left eye   . Myocardial infarction ([place]) 2016  . Pre-diabetes   . Seasonal allergies   . Seizure ([place])   . Seizures ([facility])    viral meningitis  . Stroke [facility])    1991 and imaging 05/12/15 old strokes  . TIA (transient ischemic attack) 1991   x2  . Uses hearing aid   . Viral meningitis 2017  . Viral meningitis   . Wears dentures    upper and lower   Past Surgical History:  Procedure Laterality Date  . CAROTID ENDARTERECTOMY Left 2014  . CAROTID ENDARTERECTOMY Right 1991  . CATARACT EXTRACTION W/PHACO Left 06/24/2017   Procedure: CATARACT EXTRACTION PHACO AND INTRAOCULAR LENS PLACEMENT ([place]) LEFT BORDERLINE DIABETIC;  Surgeon: Leandrew Koyanagi, MD;  Location: [place];  Service: Ophthalmology;  Laterality: Left;  . CATARACT EXTRACTION W/PHACO Right 07/22/2017   Procedure: CATARACT EXTRACTION PHACO  AND INTRAOCULAR LENS PLACEMENT ([place]) RIGHT BORDERLINE DIABETIC;  Surgeon: Leandrew Koyanagi, MD;  Location: [place];  Service: Ophthalmology;  Laterality: Right;  . CORONARY ARTERY BYPASS GRAFT     x5  . SKIN LESION EXCISION Left    left ear  . URETERAL STENT PLACEMENT     Family History  Problem Relation Age of Onset  . Stroke Mother   . Heart attack Mother   . Stroke Father   . Stroke Paternal Grandmother   . Stroke Paternal Grandfather   . Alzheimer's disease Paternal Grandfather   . Stroke Sister   . Alzheimer's disease Sister   . Stroke Brother   . Alzheimer's disease Maternal  Grandmother    Social History   Socioeconomic History  . Marital status: Married    Spouse name: Not on file  . Number of children: Not on file  . Years of education: Not on file  . Highest education level: 10th grade  Occupational History  . Not on file  Social Needs  . Financial resource strain: Not hard at all  . Food insecurity    Worry: Never true    Inability: Never true  . Transportation needs    Medical: No    Non-medical: No  Tobacco Use  . Smoking status: Former Smoker    Packs/day: 2.00    Years: 10.00    Pack years: 20.00    Types: Cigarettes    Quit date: 04/28/1970    Years since quitting: 48.8  . Smokeless tobacco: Former Network engineer and Sexual Activity  . Alcohol use: No    Alcohol/week: 0.0 standard drinks    Comment: previous  . Drug use: No  . Sexual activity: Yes    Comment: wife   Lifestyle  . Physical activity    Days per week: 0 days    Minutes per session: 0 min  . Stress: Not at all  Relationships  . Social connections    Talks on phone: More than three times a week    Gets together: More than three times a week    Attends religious service: More than 4 times per year    Active member of club or organization: No    Attends meetings of clubs or organizations: Never    Relationship status: Married  Other Topics Concern  . Not on file  Social History Narrative   Re-married x 3 years    Retired    Owns guns, wears seat belt, safe in relationship    2 sons and 1 daughter     Outpatient Encounter Medications as of 02/08/2019  Medication Sig  . albuterol (VENTOLIN HFA) 108 (90 Base) MCG/ACT inhaler Inhale 1-2 puffs into the lungs every 4 (four) hours as needed for wheezing or shortness of breath.  Marland Kitchen aspirin EC 81 MG tablet Take 81 mg by mouth daily.  . Cholecalciferol (VITAMIN D3) 2000 UNITS capsule Take 2,000 Units by mouth daily. am  . clopidogrel (PLAVIX) 75 MG tablet Take 1 tablet (75 mg total) by mouth daily.  . fexofenadine  (ALLEGRA) 180 MG tablet Take 1 tablet (180 mg total) by mouth daily as needed for allergies or rhinitis.  . fluticasone (FLOVENT HFA) 110 MCG/ACT inhaler Inhale 2 puffs into the lungs 2 (two) times daily. Rinse your mouth  . folic acid (FOLVITE) 1 MG tablet Take 0.8 mg by mouth daily. am  . glucose blood (ACCU-CHEK AVIVA PLUS) test strip Check blood sugar 1 x daily  .  metoprolol tartrate (LOPRESSOR) 50 MG tablet Take 1 tablet (50 mg total) by mouth 2 (two) times daily.  . Multiple Vitamins-Minerals (CENTRUM SILVER 50+MEN PO) Take by mouth.  . pantoprazole (PROTONIX) 40 MG tablet TAKE 1 TABLET (40 MG TOTAL) BY MOUTH 2 (TWO) TIMES DAILY. AM AND DINNER  . simvastatin (ZOCOR) 40 MG tablet Take 1 tablet (40 mg total) by mouth daily. am   Facility-Administered Encounter Medications as of 02/08/2019  Medication  . ipratropium-albuterol (DUONEB) 0.5-2.5 (3) MG/3ML nebulizer solution 3 mL    Activities of Daily Living In your present state of health, do you have any difficulty performing the following activities: 02/08/2019  Hearing? Y  Comment Hearing aid  Vision? N  Difficulty concentrating or making decisions? N  Walking or climbing stairs? N  Dressing or bathing? N  Doing errands, shopping? N  Preparing Food and eating ? N  Using the Toilet? N  In the past six months, have you accidently leaked urine? N  Do you have problems with loss of bowel control? N  Managing your Medications? N  Managing your Finances? N  Housekeeping or managing your Housekeeping? N  Some recent data might be hidden    Patient Care Team: McLean-Scocuzza, Nino Glow, MD as PCP - General (Internal Medicine)   Assessment:   This is a routine wellness examination for Theo.  I connected with patient 02/08/19 at  9:00 AM EDT by an audio enabled telemedicine application and verified that I am speaking with the correct person using two identifiers. Patient stated full name and DOB. Patient gave permission to continue  with virtual visit. Patient's location was at home and Nurse's location was at [place] office.   Health Maintenance Due: Update all pending maintenance due as appropriate.   See completed HM at the end of note.   Eye: Visual acuity not assessed. Virtual visit. Wears corrective lenses.  Dental: Dentures- yes  Hearing: Hearing aids- yes  Safety:  Patient feels safe at home- yes Patient does have smoke detectors at home- yes Patient does wear sunscreen or protective clothing when in direct sunlight - yes Patient does wear seat belt when in a moving vehicle - yes Patient drives- yes Adequate lighting in walkways free from debris- yes Grab bars and handrails used as appropriate- yes Ambulates with no assistive device Cell phone on person when ambulating outside of the home- yes  Social: Alcohol intake - no       Smoking history- former   Smokers in home? none Illicit drug use? none  Depression: PHQ 2 &9 complete. See screening below. Denies irritability, anhedonia, sadness/tearfullness.  Stable.   Falls: See screening below.    Medication: Taking as directed and without issues.   Covid-19: Precautions and sickness symptoms discussed. Wears mask, social distancing, hand hygiene as appropriate.   Activities of Daily Living Patient denies needing assistance with: household chores, feeding themselves, getting from bed to chair, getting to the toilet, bathing/showering, dressing, managing money, or preparing meals.   Memory: Patient is alert. Patient denies difficulty focusing or concentrating. Correctly identified the president of the [place], season and recall. Patient likes to play solitaire for brain stimulation.  BMI- discussed the importance of a healthy diet, water intake and the benefits of aerobic exercise.  Educational material provided.  Physical activity- stationary bike daily, 5 minutes  Diet: Low sodium Water: good intake  Other Providers Patient Care Team:  McLean-Scocuzza, Nino Glow, MD as PCP - General (Internal Medicine)  Exercise Activities and  Dietary recommendations Current Exercise Habits: Home exercise routine, Type of exercise: calisthenics(stationary bike), Intensity: Mild  Goals    . DIET - REDUCE SALT INTAKE/ Low sodium diet       Fall Risk Fall Risk  02/08/2019 01/20/2019 07/02/2018 03/09/2018 01/19/2018  Falls in the past year? 0 0 0 0 No   Timed Get Up and Go Performed: no, virtual visit  Depression Screen PHQ 2/9 Scores 02/08/2019 01/20/2019 03/09/2018 01/19/2018  PHQ - 2 Score 0 0 0 0    Cognitive Function     6CIT Screen 02/08/2019 01/19/2018 12/09/2016  What Year? 0 points 0 points 0 points  What month? 0 points 0 points 0 points  What time? 0 points 0 points 0 points  Count back from 20 0 points 0 points 0 points  Months in reverse 0 points 0 points 0 points  Repeat phrase 0 points 2 points 2 points  Total Score 0 2 2    Immunization History  Administered Date(s) Administered  . Influenza, High Dose Seasonal PF 01/22/2015, 12/31/2017, 12/15/2018  . Influenza-Unspecified 02/23/2015, 01/03/2017, 12/31/2017, 12/15/2018  . Pneumococcal Conjugate-13 12/10/2015  . Pneumococcal Polysaccharide-23 05/11/2018  . Tdap 07/05/2018   Screening Tests Health Maintenance  Topic Date Due  . TETANUS/TDAP  07/04/2028  . INFLUENZA VACCINE  Completed  . PNA vac Low Risk Adult  Completed       Plan:   Keep all routine maintenance appointments.   Follow up 06/01/19  Medicare Attestation I have personally reviewed: The patient's medical and social history Their use of alcohol, tobacco or illicit drugs Their current medications and supplements The patient's functional ability including ADLs,fall risks, home safety risks, cognitive, and hearing and visual impairment Diet and physical activities Evidence for depression   In addition, I have reviewed and discussed with patient certain preventive protocols, quality metrics,  and best practice recommendations. A written personalized care plan for preventive services as well as general preventive health recommendations were provided to patient via mail.     Varney Biles, LPN  [phone]

## 2019-02-11 DIAGNOSIS — E782 Mixed hyperlipidemia: Secondary | ICD-10-CM | POA: Diagnosis not present

## 2019-02-11 DIAGNOSIS — I10 Essential (primary) hypertension: Secondary | ICD-10-CM | POA: Diagnosis not present

## 2019-02-11 DIAGNOSIS — I6523 Occlusion and stenosis of bilateral carotid arteries: Secondary | ICD-10-CM | POA: Diagnosis not present

## 2019-02-11 DIAGNOSIS — I2510 Atherosclerotic heart disease of native coronary artery without angina pectoris: Secondary | ICD-10-CM | POA: Diagnosis not present

## 2019-02-11 DIAGNOSIS — I700 Atherosclerosis of aorta: Secondary | ICD-10-CM | POA: Diagnosis not present

## 2019-02-28 ENCOUNTER — Other Ambulatory Visit: Payer: Self-pay | Admitting: Family Medicine

## 2019-02-28 DIAGNOSIS — R7303 Prediabetes: Secondary | ICD-10-CM

## 2019-03-11 IMAGING — CT CT CHEST W/O CM
2 of 3 series · 14 of 36 positions shown, 17 images · non-contrast
Comparison: Chest x-ray of 03/18/2017--no prior CT study
documenting ground-glass opacity is on the timeline for comparison.

CLINICAL DATA: History of gland glass lung nodule, follow-up,
former smoking history, current history of vertigo

EXAM:
CT CHEST WITHOUT CONTRAST
TECHNIQUE: Multidetector CT imaging of the chest was performed following the
standard protocol without IV contrast.

[Series 2: thorax · axial · 0.68mm/px · z∈[-404,-114]mm · 11 of 171 slices shown, 14 images]
[im 13/171  mediastinal]
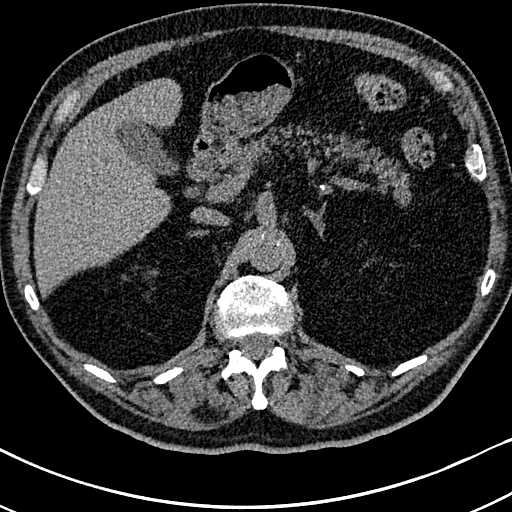
[im 13/171  lung]
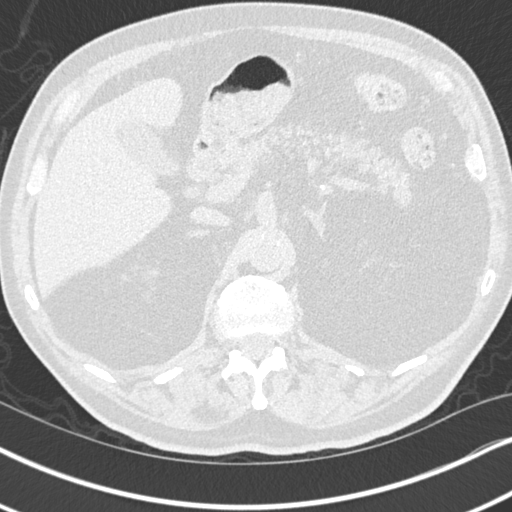
[im 26/171  lung]
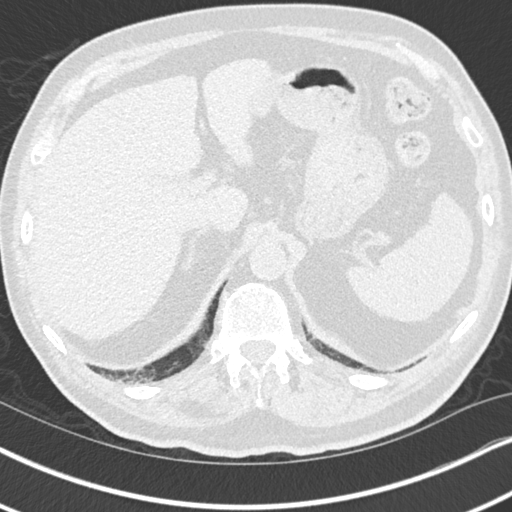
[im 38/171  lung]
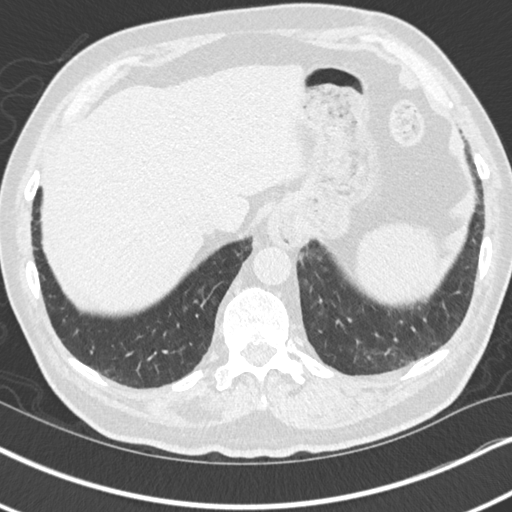
[im 57/171  lung]
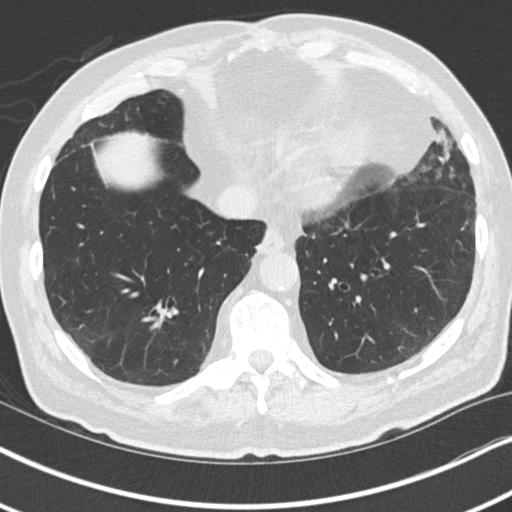
[im 70/171  mediastinal]
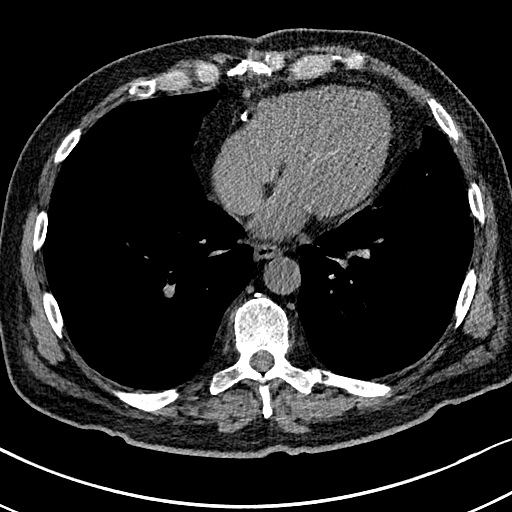
[im 70/171  lung]
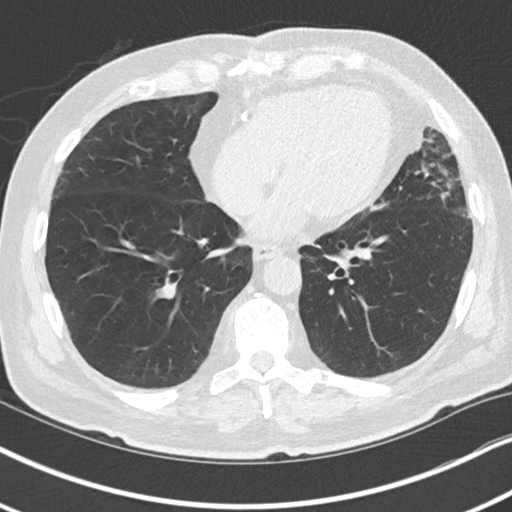
[im 89/171  lung]
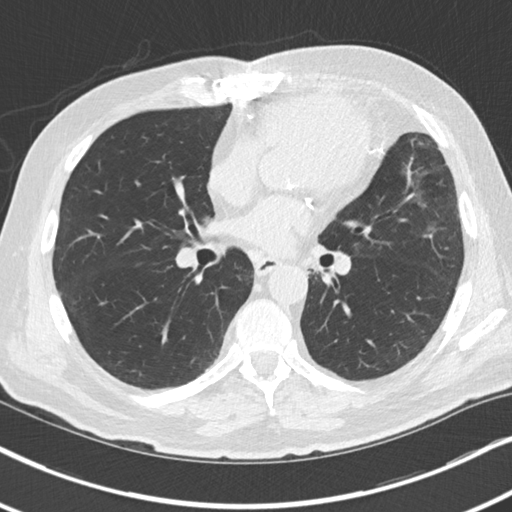
[im 101/171  lung]
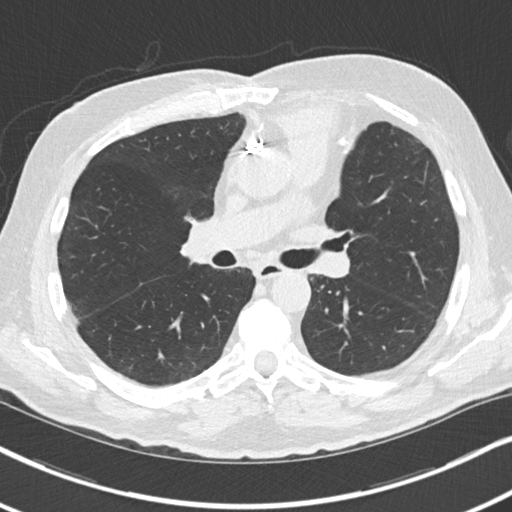
[im 114/171  lung]
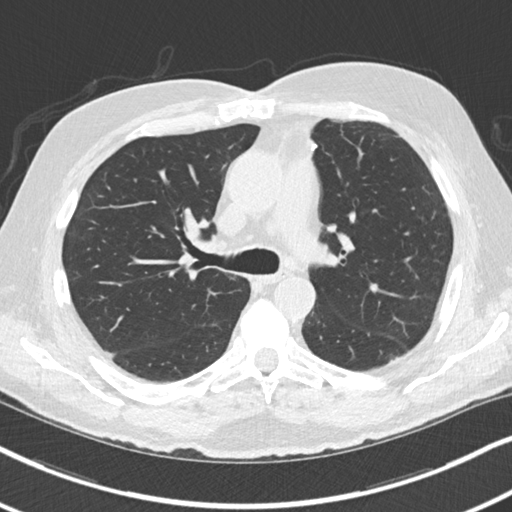
[im 133/171  mediastinal]
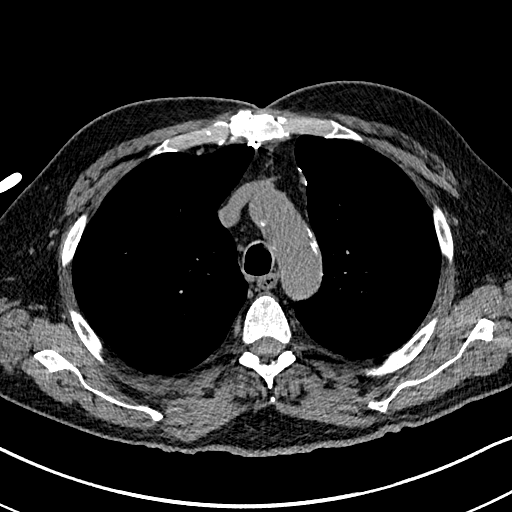
[im 133/171  lung]
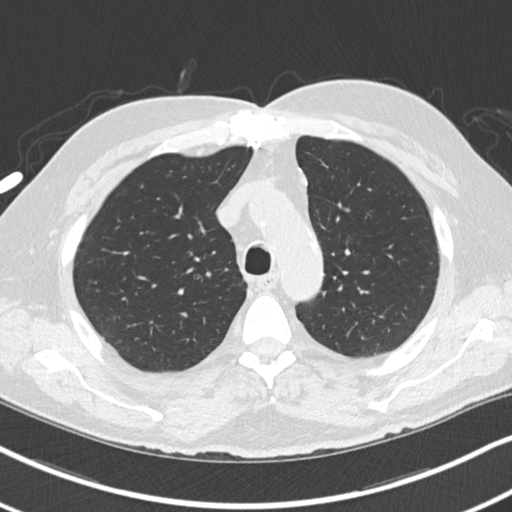
[im 145/171  lung]
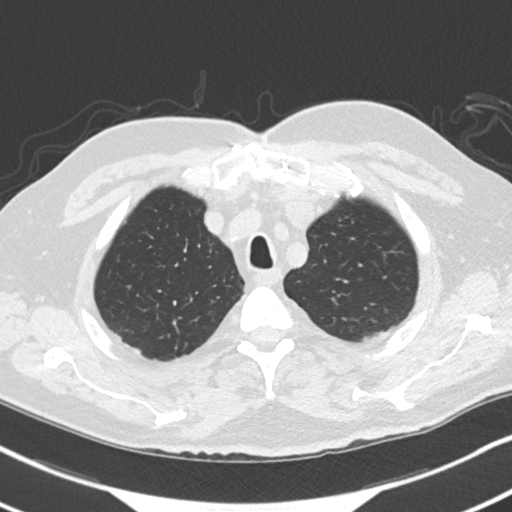
[im 158/171  lung]
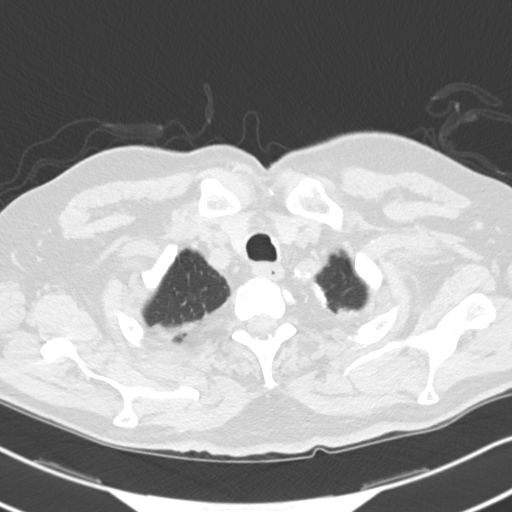

[Series 5: coronal · coronal · 0.67mm/px · 3 of 151 slices shown]
[im 31/151  lung]
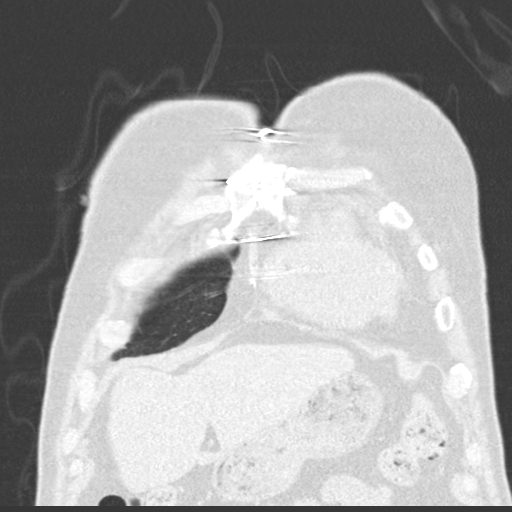
[im 61/151  lung]
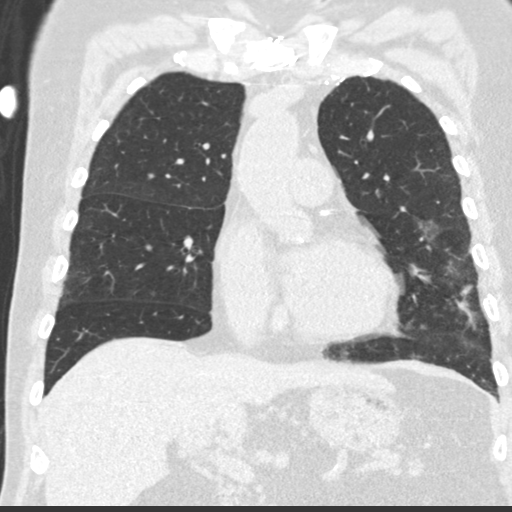
[im 91/151  lung]
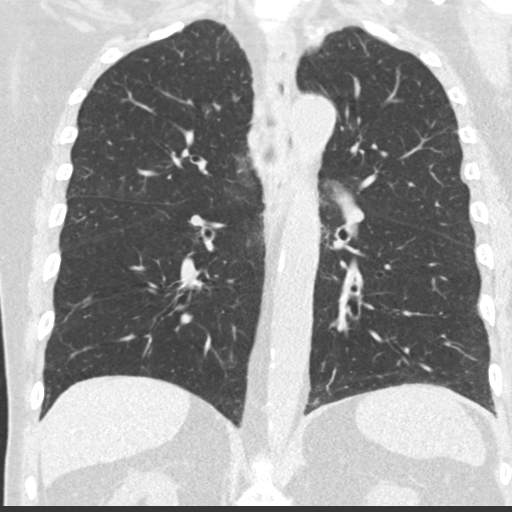

[14 of 36 positions shown; findings below may reference images not displayed]

If that study exists then direct comparison with that CT of the
chest would be recommended to assess change.
FINDINGS: Cardiovascular: Moderate thoracic aortic atherosclerosis is present.
Median sternotomy sutures are present from prior CABG. The heart is
borderline enlarged. No pericardial effusion is seen. The mid
ascending thoracic aorta measures 36 mm in diameter which is normal.
The pulmonary artery segments appear normal in caliber.

Mediastinum/Nodes: Only a few small mediastinal lymph nodes are
present none of which are pathologically enlarged. The thyroid gland
is unremarkable. There does appear to be a small hiatal hernia
present.

Lungs/Pleura: On lung window images, there is biapical
pleuroparenchymal scarring. Pleural plaques are noted bilaterally
with some scarring associated most likely asbestos related. There
are several ground-glass opacity is present. The largest are within
the left lower lobe in the lingula measuring 23 mm on image 78
series 3. A second ground-glass opacity is present within the left
lower lobe as well more peripherally on image 66 measuring 17 mm in
diameter. Also peripherally in the lingula there appears to be mild
scarring present with very mild bronchiectatic change most likely
postinflammatory or postinfectious in origin. In addition, there is
a small ground-glass opacity within the right lower lobe on image
117 series 3 measuring 8 mm in diameter. Non-contrast chest CT at
3-6 months is recommended. If the nodules are stable at time of
repeat CT, then future CT at 18-24 months (from today's scan) is
considered optional for low-risk patients, but is recommended for
high-risk patients. This recommendation follows the consensus
statement: Guidelines for Management of Incidental Pulmonary Nodules
Detected on CT Images: From the [HOSPITAL] 2630; Radiology

No active infiltrate is seen and there is no evidence of pleural
effusion. The central airway appears patent.

Upper Abdomen: Within the upper abdomen, no significant abnormality
is noted.

Musculoskeletal: The thoracic vertebrae are in normal alignment.
There is mild degenerative change in the lower thoracic spine. No
compression deformity is seen. Median sternotomy sutures are noted.
IMPRESSION: 1. Several ground-glass opacities are present in the lower lobe, 2
in the left lower lobe and 1 in the right lower lobe. No prior CT is
available to compare for interval change. Non-contrast chest CT at
3-6 months is recommended. If the nodules are stable at time of
repeat CT, then future CT at 18-24 months (from today's scan) is
considered optional for low-risk patients, but is recommended for
high-risk patients. This recommendation follows the consensus
statement: Guidelines for Management of Incidental Pulmonary Nodules
Detected on CT Images: From the [HOSPITAL] 2630; Radiology
2. Small hiatal hernia.
3. Moderate thoracic aortic atherosclerosis.

## 2019-03-30 DIAGNOSIS — R0600 Dyspnea, unspecified: Secondary | ICD-10-CM | POA: Diagnosis not present

## 2019-04-05 ENCOUNTER — Ambulatory Visit (INDEPENDENT_AMBULATORY_CARE_PROVIDER_SITE_OTHER): Payer: Medicare Other | Admitting: Internal Medicine

## 2019-04-05 ENCOUNTER — Encounter: Payer: Self-pay | Admitting: Internal Medicine

## 2019-04-05 VITALS — BP 145/62 | HR 61 | Temp 96.3°F | Ht 76.0 in | Wt 223.0 lb

## 2019-04-05 DIAGNOSIS — M25511 Pain in right shoulder: Secondary | ICD-10-CM | POA: Diagnosis not present

## 2019-04-05 DIAGNOSIS — M62830 Muscle spasm of back: Secondary | ICD-10-CM

## 2019-04-05 MED ORDER — CYCLOBENZAPRINE HCL 5 MG PO TABS: 5.0000 mg | ORAL_TABLET | Freq: Every evening | ORAL | 2 refills | Status: DC | PRN

## 2019-04-05 NOTE — Patient Instructions (Signed)
Shoulder Exercises Ask your health care provider which exercises are safe for you. Do exercises exactly as told by your health care provider and adjust them as directed. It is normal to feel mild stretching, pulling, tightness, or discomfort as you do these exercises. Stop right away if you feel sudden pain or your pain gets worse. Do not begin these exercises until told by your health care provider. Stretching exercises External rotation and abduction This exercise is sometimes called corner stretch. This exercise rotates your arm outward (external rotation) and moves your arm out from your body (abduction). 1. Stand in a doorway with one of your feet slightly in front of the other. This is called a staggered stance. If you cannot reach your forearms to the door frame, stand facing a corner of a room. 2. Choose one of the following positions as told by your health care provider: ? Place your hands and forearms on the door frame above your head. ? Place your hands and forearms on the door frame at the height of your head. ? Place your hands on the door frame at the height of your elbows. 3. Slowly move your weight onto your front foot until you feel a stretch across your chest and in the front of your shoulders. Keep your head and chest upright and keep your abdominal muscles tight. 4. Hold for __________ seconds. 5. To release the stretch, shift your weight to your back foot. Repeat __________ times. Complete this exercise __________ times a day. Extension, standing 1. Stand and hold a broomstick, a cane, or a similar object behind your back. ? Your hands should be a little wider than shoulder width apart. ? Your palms should face away from your back. 2. Keeping your elbows straight and your shoulder muscles relaxed, move the stick away from your body until you feel a stretch in your shoulders (extension). ? Avoid shrugging your shoulders while you move the stick. Keep your shoulder blades tucked  down toward the middle of your back. 3. Hold for __________ seconds. 4. Slowly return to the starting position. Repeat __________ times. Complete this exercise __________ times a day. Range-of-motion exercises Pendulum  1. Stand near a wall or a surface that you can hold onto for balance. 2. Bend at the waist and let your left / right arm hang straight down. Use your other arm to support you. Keep your back straight and do not lock your knees. 3. Relax your left / right arm and shoulder muscles, and move your hips and your trunk so your left / right arm swings freely. Your arm should swing because of the motion of your body, not because you are using your arm or shoulder muscles. 4. Keep moving your hips and trunk so your arm swings in the following directions, as told by your health care provider: ? Side to side. ? Forward and backward. ? In clockwise and counterclockwise circles. 5. Continue each motion for __________ seconds, or for as long as told by your health care provider. 6. Slowly return to the starting position. Repeat __________ times. Complete this exercise __________ times a day. Shoulder flexion, standing  1. Stand and hold a broomstick, a cane, or a similar object. Place your hands a little more than shoulder width apart on the object. Your left / right hand should be palm up, and your other hand should be palm down. 2. Keep your elbow straight and your shoulder muscles relaxed. Push the stick up with your healthy arm to   raise your left / right arm in front of your body, and then over your head until you feel a stretch in your shoulder (flexion). ? Avoid shrugging your shoulder while you raise your arm. Keep your shoulder blade tucked down toward the middle of your back. 3. Hold for __________ seconds. 4. Slowly return to the starting position. Repeat __________ times. Complete this exercise __________ times a day. Shoulder abduction, standing 1. Stand and hold a broomstick,  a cane, or a similar object. Place your hands a little more than shoulder width apart on the object. Your left / right hand should be palm up, and your other hand should be palm down. 2. Keep your elbow straight and your shoulder muscles relaxed. Push the object across your body toward your left / right side. Raise your left / right arm to the side of your body (abduction) until you feel a stretch in your shoulder. ? Do not raise your arm above shoulder height unless your health care provider tells you to do that. ? If directed, raise your arm over your head. ? Avoid shrugging your shoulder while you raise your arm. Keep your shoulder blade tucked down toward the middle of your back. 3. Hold for __________ seconds. 4. Slowly return to the starting position. Repeat __________ times. Complete this exercise __________ times a day. Internal rotation  1. Place your left / right hand behind your back, palm up. 2. Use your other hand to dangle an exercise band, a towel, or a similar object over your shoulder. Grasp the band with your left / right hand so you are holding on to both ends. 3. Gently pull up on the band until you feel a stretch in the front of your left / right shoulder. The movement of your arm toward the center of your body is called internal rotation. ? Avoid shrugging your shoulder while you raise your arm. Keep your shoulder blade tucked down toward the middle of your back. 4. Hold for __________ seconds. 5. Release the stretch by letting go of the band and lowering your hands. Repeat __________ times. Complete this exercise __________ times a day. Strengthening exercises External rotation  1. Sit in a stable chair without armrests. 2. Secure an exercise band to a stable object at elbow height on your left / right side. 3. Place a soft object, such as a folded towel or a small pillow, between your left / right upper arm and your body to move your elbow about 4 inches (10 cm) away  from your side. 4. Hold the end of the exercise band so it is tight and there is no slack. 5. Keeping your elbow pressed against the soft object, slowly move your forearm out, away from your abdomen (external rotation). Keep your body steady so only your forearm moves. 6. Hold for __________ seconds. 7. Slowly return to the starting position. Repeat __________ times. Complete this exercise __________ times a day. Shoulder abduction  1. Sit in a stable chair without armrests, or stand up. 2. Hold a __________ weight in your left / right hand, or hold an exercise band with both hands. 3. Start with your arms straight down and your left / right palm facing in, toward your body. 4. Slowly lift your left / right hand out to your side (abduction). Do not lift your hand above shoulder height unless your health care provider tells you that this is safe. ? Keep your arms straight. ? Avoid shrugging your shoulder while you   do this movement. Keep your shoulder blade tucked down toward the middle of your back. 5. Hold for __________ seconds. 6. Slowly lower your arm, and return to the starting position. Repeat __________ times. Complete this exercise __________ times a day. Shoulder extension 1. Sit in a stable chair without armrests, or stand up. 2. Secure an exercise band to a stable object in front of you so it is at shoulder height. 3. Hold one end of the exercise band in each hand. Your palms should face each other. 4. Straighten your elbows and lift your hands up to shoulder height. 5. Step back, away from the secured end of the exercise band, until the band is tight and there is no slack. 6. Squeeze your shoulder blades together as you pull your hands down to the sides of your thighs (extension). Stop when your hands are straight down by your sides. Do not let your hands go behind your body. 7. Hold for __________ seconds. 8. Slowly return to the starting position. Repeat __________ times.  Complete this exercise __________ times a day. Shoulder row 1. Sit in a stable chair without armrests, or stand up. 2. Secure an exercise band to a stable object in front of you so it is at waist height. 3. Hold one end of the exercise band in each hand. Position your palms so that your thumbs are facing the ceiling (neutral position). 4. Bend each of your elbows to a 90-degree angle (right angle) and keep your upper arms at your sides. 5. Step back until the band is tight and there is no slack. 6. Slowly pull your elbows back behind you. 7. Hold for __________ seconds. 8. Slowly return to the starting position. Repeat __________ times. Complete this exercise __________ times a day. Shoulder press-ups  1. Sit in a stable chair that has armrests. Sit upright, with your feet flat on the floor. 2. Put your hands on the armrests so your elbows are bent and your fingers are pointing forward. Your hands should be about even with the sides of your body. 3. Push down on the armrests and use your arms to lift yourself off the chair. Straighten your elbows and lift yourself up as much as you comfortably can. ? Move your shoulder blades down, and avoid letting your shoulders move up toward your ears. ? Keep your feet on the ground. As you get stronger, your feet should support less of your body weight as you lift yourself up. 4. Hold for __________ seconds. 5. Slowly lower yourself back into the chair. Repeat __________ times. Complete this exercise __________ times a day. Wall push-ups  1. Stand so you are facing a stable wall. Your feet should be about one arm-length away from the wall. 2. Lean forward and place your palms on the wall at shoulder height. 3. Keep your feet flat on the floor as you bend your elbows and lean forward toward the wall. 4. Hold for __________ seconds. 5. Straighten your elbows to push yourself back to the starting position. Repeat __________ times. Complete this exercise  __________ times a day. This information is not intended to replace advice given to you by your health care provider. Make sure you discuss any questions you have with your health care provider. Document Released: 02/26/2005 Document Revised: 08/06/2018 Document Reviewed: 05/14/2018 Elsevier Patient Education  2020 [place].  Shoulder Range of Motion Exercises Shoulder range of motion (ROM) exercises are done to keep the shoulder moving freely or to increase movement.  They are often recommended for people who have shoulder pain or stiffness or who are recovering from a shoulder surgery. Phase 1 exercises When you are able, do this exercise 1-2 times per day for 30-60 seconds in each direction, or as directed by your health care provider. Pendulum exercise To do this exercise while sitting: 1. Sit in a chair or at the edge of your bed with your feet flat on the floor. 2. Let your affected arm hang down in front of you over the edge of the bed or chair. 3. Relax your shoulder, arm, and hand. [place] your body so your arm gently swings in small circles. You can also use your unaffected arm to start the motion. 5. Repeat changing the direction of the circles, swinging your arm left and right, and swinging your arm forward and back. To do this exercise while standing: 1. Stand next to a sturdy chair or table, and hold on to it with your hand on your unaffected side. 2. Bend forward at the waist. 3. Bend your knees slightly. 4. Relax your shoulder, arm, and hand. 5. While keeping your shoulder relaxed, use body motion to swing your arm in small circles. 6. Repeat changing the direction of the circles, swinging your arm left and right, and swinging your arm forward and back. 7. Between exercises, stand up tall and take a short break to relax your lower back.  Phase 2 exercises Do these exercises 1-2 times per day or as told by your health care provider. Hold each stretch for 30 seconds, and  repeat 3 times. Do the exercises with one or both arms as instructed by your health care provider. For these exercises, sit at a table with your hand and arm supported by the table. A chair that slides easily or has wheels can be helpful. External rotation 1. Turn your chair so that your affected side is nearest to the table. 2. Place your forearm on the table to your side. Bend your elbow about 90 at the elbow (right angle) and place your hand palm facing down on the table. Your elbow should be about 6 inches away from your side. 3. Keeping your arm on the table, lean your body forward. Abduction 1. Turn your chair so that your affected side is nearest to the table. 2. Place your forearm and hand on the table so that your thumb points toward the ceiling and your arm is straight out to your side. 3. Slide your hand out to the side and away from you, using your unaffected arm to do the work. 4. To increase the stretch, you can slide your chair away from the table. Flexion: forward stretch 1. Sit facing the table. Place your hand and elbow on the table in front of you. 2. Slide your hand forward and away from you, using your unaffected arm to do the work. 3. To increase the stretch, you can slide your chair backward. Phase 3 exercises Do these exercises 1-2 times per day or as told by your health care provider. Hold each stretch for 30 seconds, and repeat 3 times. Do the exercises with one or both arms as instructed by your health care provider. Cross-body stretch: posterior capsule stretch 1. Lift your arm straight out in front of you. 2. Bend your arm 90 at the elbow (right angle) so your forearm moves across your body. 3. Use your other arm to gently pull the elbow across your body, toward your other shoulder. Wall climbs  1. Stand with your affected arm extended out to the side with your hand resting on a door frame. 2. Slide your hand slowly up the door frame. 3. To increase the stretch,  step through the door frame. Keep your body upright and do not lean. Wand exercises You will need a cane, a piece of PVC pipe, or a sturdy wooden dowel for wand exercises. Flexion To do this exercise while standing: 1. Hold the wand with both of your hands, palms down. 2. Using the other arm to help, lift your arms up and over your head, if able. 3. Push upward with your other arm to gently increase the stretch. To do this exercise while lying down: 1. Lie on your back with your elbows resting on the floor and the wand in both your hands. Your hands will be palm down, or pointing toward your feet. 2. Lift your hands toward the ceiling, using your unaffected arm to help if needed. 3. Bring your arms overhead as able, using your unaffected arm to help if needed. Internal rotation 1. Stand while holding the wand behind you with both hands. Your unaffected arm should be extended above your head with the arm of the affected side extended behind you at the level of your waist. The wand should be pointing straight up and down as you hold it. 2. Slowly pull the wand up behind your back by straightening the elbow of your unaffected arm and bending the elbow of your affected arm. External rotation 1. Lie on your back with your affected upper arm supported on a small pillow or rolled towel. When you first do this exercise, keep your upper arm close to your body. Over time, bring your arm up to a 90 angle out to the side. 2. Hold the wand across your stomach and with both hands palm up. Your elbow on your affected side should be bent at a 90 angle. 3. Use your unaffected side to help push your forearm away from you and toward the floor. Keep your elbow on your affected side bent at a 90 angle. Contact a health care provider if you have:  New or increasing pain.  New numbness, tingling, weakness, or discoloration in your arm or hand. This information is not intended to replace advice given to you by  your health care provider. Make sure you discuss any questions you have with your health care provider. Document Released: 01/11/2003 Document Revised: 05/27/2017 Document Reviewed: 05/27/2017 Elsevier Patient Education  2020 [place].  Shoulder Pain Many things can cause shoulder pain, including:  An injury to the shoulder.  Overuse of the shoulder.  Arthritis. The source of the pain can be:  Inflammation.  An injury to the shoulder joint.  An injury to a tendon, ligament, or bone. Follow these instructions at home: Pay attention to changes in your symptoms. Let your health care provider know about them. Follow these instructions to relieve your pain. If you have a sling:  Wear the sling as told by your health care provider. Remove it only as told by your health care provider.  Loosen the sling if your fingers tingle, become numb, or turn cold and blue.  Keep the sling clean.  If the sling is not waterproof: ? Do not let it get wet. Remove it to shower or bathe.  Move your arm as little as possible, but keep your hand moving to prevent swelling. Managing pain, stiffness, and swelling   If directed, put ice on  the painful area: ? Put ice in a plastic bag. ? Place a towel between your skin and the bag. ? Leave the ice on for 20 minutes, 2-3 times per day. Stop applying ice if it does not help with the pain.  Squeeze a soft ball or a foam pad as much as possible. This helps to keep the shoulder from swelling. It also helps to strengthen the arm. General instructions  Take over-the-counter and prescription medicines only as told by your health care provider.  Keep all follow-up visits as told by your health care provider. This is important. Contact a health care provider if:  Your pain gets worse.  Your pain is not relieved with medicines.  New pain develops in your arm, hand, or fingers. Get help right away if:  Your arm, hand, or fingers: ? Tingle. ?  Become numb. ? Become swollen. ? Become painful. ? Turn white or blue. Summary  Shoulder pain can be caused by an injury, overuse, or arthritis.  Pay attention to changes in your symptoms. Let your health care provider know about them.  This condition may be treated with a sling, ice, and pain medicines.  Contact your health care provider if the pain gets worse or new pain develops. Get help right away if your arm, hand, or fingers tingle or become numb, swollen, or painful.  Keep all follow-up visits as told by your health care provider. This is important. This information is not intended to replace advice given to you by your health care provider. Make sure you discuss any questions you have with your health care provider. Document Released: 01/22/2005 Document Revised: 10/27/2017 Document Reviewed: 10/27/2017 Elsevier Patient Education  2020 [employer].

## 2019-04-05 NOTE — Progress Notes (Signed)
Virtual Visit via Video Note  I connected with Keith Cruz  on 04/05/19 at  3:14 PM EST by a video enabled telemedicine application and verified that I am speaking with the correct person using two identifiers.  Location patient: home Location provider:work or home office Persons participating in the virtual visit: patient, provider, pts wife  I discussed the limitations of evaluation and management by telemedicine and the availability of in person appointments. The patient expressed understanding and agreed to proceed.   HPI: 1. C/o right shoulder pain, right upper back pain x 3 weeks hurt shoulder pulling down the garage was getting better now worse weather triggers 5/10 with ROM 8/10 overhead motion and reaching behind back worse across body motion ok  Tried a heating pad x 15 minutes, tylenol and ibuprofen Muscles feel tight wife had tried to massage  ROS: See pertinent positives and negatives per HPI.  Past Medical History:  Diagnosis Date  . Arthritis    back  . Asthma   . Atherosclerosis of aorta ([place])   . Atherosclerosis of aorta ([place])   . Bronchitis 05/2017   chronic  . CAD (coronary artery disease)   . Carotid artery disease ([place])   . Carotid stenosis   . Carotid stenosis, bilateral   . Chronic bronchitis ([place])   . Chronic cough   . COPD (chronic obstructive pulmonary disease) ([place])   . Dysrhythmia   . GERD (gastroesophageal reflux disease)   . Heart murmur   . HOH (hard of hearing)    hearing aids  . Hypercholesteremia   . Hypertension   . Kidney disease    stent  . Macular degeneration of left eye   . Myocardial infarction ([place]) 2016  . Pre-diabetes   . Seasonal allergies   . Seizure ([place])   . Seizures (HCC)    viral meningitis  . Stroke [facility])    1991 and imaging 05/12/15 old strokes  . TIA (transient ischemic attack) 1991   x2  . Uses hearing aid   . Viral meningitis 2017  . Viral meningitis   . Wears dentures    upper and lower    Past  Surgical History:  Procedure Laterality Date  . CAROTID ENDARTERECTOMY Left 2014  . CAROTID ENDARTERECTOMY Right 1991  . CATARACT EXTRACTION W/PHACO Left 06/24/2017   Procedure: CATARACT EXTRACTION PHACO AND INTRAOCULAR LENS PLACEMENT ([place]) LEFT BORDERLINE DIABETIC;  Surgeon: Leandrew Koyanagi, MD;  Location: [place];  Service: Ophthalmology;  Laterality: Left;  . CATARACT EXTRACTION W/PHACO Right 07/22/2017   Procedure: CATARACT EXTRACTION PHACO AND INTRAOCULAR LENS PLACEMENT ([place]) RIGHT BORDERLINE DIABETIC;  Surgeon: Leandrew Koyanagi, MD;  Location: [place];  Service: Ophthalmology;  Laterality: Right;  . CORONARY ARTERY BYPASS GRAFT     x5  . SKIN LESION EXCISION Left    left ear  . URETERAL STENT PLACEMENT      Family History  Problem Relation Age of Onset  . Stroke Mother   . Heart attack Mother   . Stroke Father   . Stroke Paternal Grandmother   . Stroke Paternal Grandfather   . Alzheimer's disease Paternal Grandfather   . Stroke Sister   . Alzheimer's disease Sister   . Stroke Brother   . Alzheimer's disease Maternal Grandmother     SOCIAL HX: lives at home with wife    Current Outpatient Medications:  .  ACCU-CHEK AVIVA PLUS test strip, CHECK BLOOD SUGAR 1 X DAILY, Disp: 100 strip, Rfl: 5 .  albuterol (  VENTOLIN HFA) 108 (90 Base) MCG/ACT inhaler, Inhale 1-2 puffs into the lungs every 4 (four) hours as needed for wheezing or shortness of breath., Disp: 1 Inhaler, Rfl: 12 .  aspirin EC 81 MG tablet, Take 81 mg by mouth daily., Disp: , Rfl:  .  Cholecalciferol (VITAMIN D3) 2000 UNITS capsule, Take 2,000 Units by mouth daily. am, Disp: , Rfl:  .  clopidogrel (PLAVIX) 75 MG tablet, Take 1 tablet (75 mg total) by mouth daily., Disp: 90 tablet, Rfl: 3 .  fexofenadine (ALLEGRA) 180 MG tablet, Take 1 tablet (180 mg total) by mouth daily as needed for allergies or rhinitis., Disp: 90 tablet, Rfl: 3 .  fluticasone (FLOVENT HFA) 110 MCG/ACT inhaler,  Inhale 2 puffs into the lungs 2 (two) times daily. Rinse your mouth, Disp: 1 Inhaler, Rfl: 12 .  folic acid (FOLVITE) 1 MG tablet, Take 0.8 mg by mouth daily. am, Disp: , Rfl:  .  metoprolol tartrate (LOPRESSOR) 50 MG tablet, Take 1 tablet (50 mg total) by mouth 2 (two) times daily., Disp: 180 tablet, Rfl: 3 .  Multiple Vitamins-Minerals (CENTRUM SILVER 50+MEN PO), Take by mouth., Disp: , Rfl:  .  pantoprazole (PROTONIX) 40 MG tablet, TAKE 1 TABLET (40 MG TOTAL) BY MOUTH 2 (TWO) TIMES DAILY. AM AND DINNER, Disp: 180 tablet, Rfl: 1 .  simvastatin (ZOCOR) 40 MG tablet, Take 1 tablet (40 mg total) by mouth daily. am, Disp: 90 tablet, Rfl: 3 .  cyclobenzaprine (FLEXERIL) 5 MG tablet, Take 1-2 tablets (5-10 mg total) by mouth at bedtime as needed for muscle spasms., Disp: 60 tablet, Rfl: 2  Current Facility-Administered Medications:  .  ipratropium-albuterol (DUONEB) 0.5-2.5 (3) MG/3ML nebulizer solution 3 mL, 3 mL, Nebulization, Once, Karamalegos, Devonne Doughty, DO  EXAM:  VITALS per patient if applicable:  GENERAL: alert, oriented, appears well and in no acute distress  HEENT: atraumatic, conjunttiva clear, no obvious abnormalities on inspection of external nose and ears  NECK: normal movements of the head and neck  LUNGS: on inspection no signs of respiratory distress, breathing rate appears normal, no obvious gross SOB, gasping or wheezing  CV: no obvious cyanosis  MS: moves all visible extremities without noticeable abnormality Pain with ROM right shoulder overhead and behind back and limited ROM  PSYCH/NEURO: pleasant and cooperative, no obvious depression or anxiety, speech and thought processing grossly intact  ASSESSMENT AND PLAN:  Discussed the following assessment and plan:  Acute pain of right shoulder - Plan: cyclobenzaprine (FLEXERIL) 5-10 MG tablet qhs prn Muscle spasm of back - Plan: cyclobenzaprine (FLEXERIL) 5 MG tablet Shoulder exercises given  Declines Xray and ortho  for now and prednisone  Consider and will my chart if not better  rec lidocaine or salonpas patch otc   HM Flu shot utd pna 23utd prevnar utd  Tdap utd Consider shingrix in future if not had  Hep C neg 10/07/13  Former smoker since age 58 y.o to 89 max 2ppd  cologaurd 07/12/18 neg PSAnormal3/2/20 0.38   Cards-Dr K  Eye [place] eye  [facility] Dr. Lucky Cowboy  [facility] f/u Dr. Candiss Norse renal    -we discussed possible serious and likely etiologies, options for evaluation and workup, limitations of telemedicine visit vs in person visit, treatment, treatment risks and precautions. Pt prefers to treat via telemedicine empirically rather then risking or undertaking an in person visit at this moment. Patient agrees to seek prompt in person care if worsening, new symptoms arise, or if is not improving with treatment.  I discussed the assessment and treatment plan with the patient. The patient was provided an opportunity to ask questions and all were answered. The patient agreed with the plan and demonstrated an understanding of the instructions.   The patient was advised to call back or seek an in-person evaluation if the symptoms worsen or if the condition fails to improve as anticipated.  Time spent 15 minutes  Delorise Jackson, MD

## 2019-06-01 ENCOUNTER — Ambulatory Visit (INDEPENDENT_AMBULATORY_CARE_PROVIDER_SITE_OTHER): Payer: Medicare Other | Admitting: Internal Medicine

## 2019-06-01 ENCOUNTER — Encounter: Payer: Self-pay | Admitting: Internal Medicine

## 2019-06-01 ENCOUNTER — Other Ambulatory Visit: Payer: Self-pay

## 2019-06-01 VITALS — BP 158/69 | Ht 76.0 in | Wt 223.0 lb

## 2019-06-01 DIAGNOSIS — I2510 Atherosclerotic heart disease of native coronary artery without angina pectoris: Secondary | ICD-10-CM

## 2019-06-01 DIAGNOSIS — N529 Male erectile dysfunction, unspecified: Secondary | ICD-10-CM

## 2019-06-01 DIAGNOSIS — I10 Essential (primary) hypertension: Secondary | ICD-10-CM

## 2019-06-01 DIAGNOSIS — Z125 Encounter for screening for malignant neoplasm of prostate: Secondary | ICD-10-CM

## 2019-06-01 DIAGNOSIS — Z1329 Encounter for screening for other suspected endocrine disorder: Secondary | ICD-10-CM

## 2019-06-01 DIAGNOSIS — N1831 Chronic kidney disease, stage 3a: Secondary | ICD-10-CM

## 2019-06-01 DIAGNOSIS — I6523 Occlusion and stenosis of bilateral carotid arteries: Secondary | ICD-10-CM | POA: Diagnosis not present

## 2019-06-01 DIAGNOSIS — E785 Hyperlipidemia, unspecified: Secondary | ICD-10-CM

## 2019-06-01 DIAGNOSIS — R7303 Prediabetes: Secondary | ICD-10-CM

## 2019-06-01 NOTE — Progress Notes (Signed)
Virtual Visit via Video Note  I connected with Keith Cruz  on 06/01/19 at  1:52 PM EST by a video enabled telemedicine application and verified that I am speaking with the correct person using two identifiers.  Location patient: home Location provider:work or home office Persons participating in the virtual visit: patient, provider, pts wife  I discussed the limitations of evaluation and management by telemedicine and the availability of in person appointments. The patient expressed understanding and agreed to proceed.   HPI: 1. HTN with CKD 3 last GFR 49 01/18/2019 could not tolerate ACEI/ARB in the past due to hyperK, took diuretic but declines to take this and allergic to CCB, currently on lopressor 50 mg bid  HR running 140s-150s at times 138/57 HR 53 05/30/19 was 152/62 HR 58 Wife reports he is eating hot dogs and cheetos   2. Right shoulder pain improved felt like was shingles like pain w/o shingles   3. Wants to disc covid vaccine he is not sure if will get this   ROS: See pertinent positives and negatives per HPI. Denies CP, SOB   Past Medical History:  Diagnosis Date  . Arthritis    back  . Asthma   . Atherosclerosis of aorta ([place])   . Atherosclerosis of aorta ([place])   . Bronchitis 05/2017   chronic  . CAD (coronary artery disease)   . Carotid artery disease ([place])   . Carotid stenosis   . Carotid stenosis, bilateral   . Chronic bronchitis ([place])   . Chronic cough   . COPD (chronic obstructive pulmonary disease) ([place])   . Dysrhythmia   . GERD (gastroesophageal reflux disease)   . Heart murmur   . HOH (hard of hearing)    hearing aids  . Hypercholesteremia   . Hypertension   . Kidney disease    stent  . Macular degeneration of left eye   . Myocardial infarction ([place]) 2016  . Pre-diabetes   . Seasonal allergies   . Seizure ([place])   . Seizures (HCC)    viral meningitis  . Stroke [facility])    1991 and imaging 05/12/15 old strokes  . TIA (transient ischemic attack)  1991   x2  . Uses hearing aid   . Viral meningitis 2017  . Viral meningitis   . Wears dentures    upper and lower    Past Surgical History:  Procedure Laterality Date  . CAROTID ENDARTERECTOMY Left 2014  . CAROTID ENDARTERECTOMY Right 1991  . CATARACT EXTRACTION W/PHACO Left 06/24/2017   Procedure: CATARACT EXTRACTION PHACO AND INTRAOCULAR LENS PLACEMENT ([place]) LEFT BORDERLINE DIABETIC;  Surgeon: Leandrew Koyanagi, MD;  Location: [place];  Service: Ophthalmology;  Laterality: Left;  . CATARACT EXTRACTION W/PHACO Right 07/22/2017   Procedure: CATARACT EXTRACTION PHACO AND INTRAOCULAR LENS PLACEMENT ([place]) RIGHT BORDERLINE DIABETIC;  Surgeon: Leandrew Koyanagi, MD;  Location: [place];  Service: Ophthalmology;  Laterality: Right;  . CORONARY ARTERY BYPASS GRAFT     x5  . SKIN LESION EXCISION Left    left ear  . URETERAL STENT PLACEMENT      Family History  Problem Relation Age of Onset  . Stroke Mother   . Heart attack Mother   . Stroke Father   . Stroke Paternal Grandmother   . Stroke Paternal Grandfather   . Alzheimer's disease Paternal Grandfather   . Stroke Sister   . Alzheimer's disease Sister   . Stroke Brother   . Alzheimer's disease Maternal Grandmother  SOCIAL HX: married    Current Outpatient Medications:  .  ACCU-CHEK AVIVA PLUS test strip, CHECK BLOOD SUGAR 1 X DAILY, Disp: 100 strip, Rfl: 5 .  albuterol (VENTOLIN HFA) 108 (90 Base) MCG/ACT inhaler, Inhale 1-2 puffs into the lungs every 4 (four) hours as needed for wheezing or shortness of breath., Disp: 1 Inhaler, Rfl: 12 .  aspirin EC 81 MG tablet, Take 81 mg by mouth daily., Disp: , Rfl:  .  Cholecalciferol (VITAMIN D3) 2000 UNITS capsule, Take 2,000 Units by mouth daily. am, Disp: , Rfl:  .  clopidogrel (PLAVIX) 75 MG tablet, Take 1 tablet (75 mg total) by mouth daily., Disp: 90 tablet, Rfl: 3 .  cyclobenzaprine (FLEXERIL) 5 MG tablet, Take 1-2 tablets (5-10 mg total) by mouth  at bedtime as needed for muscle spasms., Disp: 60 tablet, Rfl: 2 .  fexofenadine (ALLEGRA) 180 MG tablet, Take 1 tablet (180 mg total) by mouth daily as needed for allergies or rhinitis., Disp: 90 tablet, Rfl: 3 .  fluticasone (FLOVENT HFA) 110 MCG/ACT inhaler, Inhale 2 puffs into the lungs 2 (two) times daily. Rinse your mouth, Disp: 1 Inhaler, Rfl: 12 .  folic acid (FOLVITE) 1 MG tablet, Take 0.8 mg by mouth daily. am, Disp: , Rfl:  .  metoprolol tartrate (LOPRESSOR) 50 MG tablet, Take 1 tablet (50 mg total) by mouth 2 (two) times daily., Disp: 180 tablet, Rfl: 3 .  Multiple Vitamins-Minerals (CENTRUM SILVER 50+MEN PO), Take by mouth., Disp: , Rfl:  .  pantoprazole (PROTONIX) 40 MG tablet, TAKE 1 TABLET (40 MG TOTAL) BY MOUTH 2 (TWO) TIMES DAILY. AM AND DINNER, Disp: 180 tablet, Rfl: 1 .  simvastatin (ZOCOR) 40 MG tablet, Take 1 tablet (40 mg total) by mouth daily. am, Disp: 90 tablet, Rfl: 3  Current Facility-Administered Medications:  .  ipratropium-albuterol (DUONEB) 0.5-2.5 (3) MG/3ML nebulizer solution 3 mL, 3 mL, Nebulization, Once, Karamalegos, Devonne Doughty, DO  EXAM:  VITALS per patient if applicable:  GENERAL: alert, oriented, appears well and in no acute distress  HEENT: atraumatic, conjunttiva clear, no obvious abnormalities on inspection of external nose and ears  NECK: normal movements of the head and neck  LUNGS: on inspection no signs of respiratory distress, breathing rate appears normal, no obvious gross SOB, gasping or wheezing  CV: no obvious cyanosis  MS: moves all visible extremities without noticeable abnormality  PSYCH/NEURO: pleasant and cooperative, no obvious depression or anxiety, speech and thought processing grossly intact  ASSESSMENT AND PLAN:  Discussed the following assessment and plan:  essential HTN with CKD 3 - Plan: Comprehensive metabolic panel, Lipid panel, CBC with Differential/Platelet, Urinalysis, Routine w reflex microscopic Cont lopressor  50 mg bid  Reduce salt intake  Disc with Dr. Ward Chatters if we can consider hydralazine 10 mg bid prn for HTN control since couldn't tolerate ACEI/ARB in past, CCB, Diuretics and HR is low so do not want to increase BB  Bilateral carotid artery stenosis Arteriosclerosis of coronary artery F/u [facility]  Control HTN/HLD  Pre-diabetes - Plan: Hemoglobin A1c  HM Flu shotutd pna 23utd prevnar utd Tdap utd Considershingrix in future if not had  Hep C neg 10/07/13  Former smoker since age 60 y.o to 28 max 2ppd  cologaurd3/16/20 neg PSAnormal3/2/20 0.38  Cards-Dr K  Eye [place] eye  [facility] Dr. Lucky Cowboy  [facility] f/u Dr. Jethro Poling  -we discussed possible serious and likely etiologies, options for evaluation and workup, limitations of telemedicine visit vs in person visit, treatment, treatment risks and  precautions. Pt prefers to treat via telemedicine empirically rather then risking or undertaking an in person visit at this moment. Patient agrees to seek prompt in person care if worsening, new symptoms arise, or if is not improving with treatment.   I discussed the assessment and treatment plan with the patient. The patient was provided an opportunity to ask questions and all were answered. The patient agreed with the plan and demonstrated an understanding of the instructions.   The patient was advised to call back or seek an in-person evaluation if the symptoms worsen or if the condition fails to improve as anticipated.  Time spent 20 minutes  Delorise Jackson, MD

## 2019-06-01 NOTE — Patient Instructions (Addendum)
Goal blood pressure <130 to <140/<80   COVID-19 Vaccine Information can be found at: [email or web address] For questions related to vaccine distribution or appointments, please email [email or web address] or call [phone].    Managing Your Hypertension Hypertension is commonly called high blood pressure. This is when the force of your blood pressing against the walls of your arteries is too strong. Arteries are blood vessels that carry blood from your heart throughout your body. Hypertension forces the heart to work harder to pump blood, and may cause the arteries to become narrow or stiff. Having untreated or uncontrolled hypertension can cause heart attack, stroke, kidney disease, and other problems. What are blood pressure readings? A blood pressure reading consists of a higher number over a lower number. Ideally, your blood pressure should be below 120/80. The first ("top") number is called the systolic pressure. It is a measure of the pressure in your arteries as your heart beats. The second ("bottom") number is called the diastolic pressure. It is a measure of the pressure in your arteries as the heart relaxes. What does my blood pressure reading mean? Blood pressure is classified into four stages. Based on your blood pressure reading, your health care provider may use the following stages to determine what type of treatment you need, if any. Systolic pressure and diastolic pressure are measured in a unit called mm Hg. Normal  Systolic pressure: below 123456.  Diastolic pressure: below 80. Elevated  Systolic pressure: [id number].  Diastolic pressure: below 80. Hypertension stage 1  Systolic pressure: 0000000.  Diastolic pressure: [id number]. Hypertension stage 2  Systolic pressure: [id number] or above.  Diastolic pressure: 90 or above. What health risks are associated with hypertension? Managing your hypertension is an important  responsibility. Uncontrolled hypertension can lead to:  A heart attack.  A stroke.  A weakened blood vessel (aneurysm).  Heart failure.  Kidney damage.  Eye damage.  Metabolic syndrome.  Memory and concentration problems. What changes can I make to manage my hypertension? Hypertension can be managed by making lifestyle changes and possibly by taking medicines. Your health care provider will help you make a plan to bring your blood pressure within a normal range. Eating and drinking   Eat a diet that is high in fiber and potassium, and low in salt (sodium), added sugar, and fat. An example eating plan is called the DASH (Dietary Approaches to Stop Hypertension) diet. To eat this way: ? Eat plenty of fresh fruits and vegetables. Try to fill half of your plate at each meal with fruits and vegetables. ? Eat whole grains, such as whole wheat pasta, brown rice, or whole grain bread. Fill about one quarter of your plate with whole grains. ? Eat low-fat diary products. ? Avoid fatty cuts of meat, processed or cured meats, and poultry with skin. Fill about one quarter of your plate with lean proteins such as fish, chicken without skin, beans, eggs, and tofu. ? Avoid premade and processed foods. These tend to be higher in sodium, added sugar, and fat.  Reduce your daily sodium intake. Most people with hypertension should eat less than 1,500 mg of sodium a day.  Limit alcohol intake to no more than 1 drink a day for nonpregnant women and 2 drinks a day for men. One drink equals 12 oz of beer, 5 oz of wine, or 1 oz of hard liquor. Lifestyle  Work with your health care provider to maintain a healthy body weight, or to lose weight. Ask what an ideal weight is for  you.  Get at least 30 minutes of exercise that causes your heart to beat faster (aerobic exercise) most days of the week. Activities may include walking, swimming, or biking.  Include exercise to strengthen your muscles (resistance  exercise), such as weight lifting, as part of your weekly exercise routine. Try to do these types of exercises for 30 minutes at least 3 days a week.  Do not use any products that contain nicotine or tobacco, such as cigarettes and e-cigarettes. If you need help quitting, ask your health care provider.  Control any long-term (chronic) conditions you have, such as high cholesterol or diabetes. Monitoring  Monitor your blood pressure at home as told by your health care provider. Your personal target blood pressure may vary depending on your medical conditions, your age, and other factors.  Have your blood pressure checked regularly, as often as told by your health care provider. Working with your health care provider  Review all the medicines you take with your health care provider because there may be side effects or interactions.  Talk with your health care provider about your diet, exercise habits, and other lifestyle factors that may be contributing to hypertension.  Visit your health care provider regularly. Your health care provider can help you create and adjust your plan for managing hypertension. Will I need medicine to control my blood pressure? Your health care provider may prescribe medicine if lifestyle changes are not enough to get your blood pressure under control, and if:  Your systolic blood pressure is 130 or higher.  Your diastolic blood pressure is 80 or higher. Take medicines only as told by your health care provider. Follow the directions carefully. Blood pressure medicines must be taken as prescribed. The medicine does not work as well when you skip doses. Skipping doses also puts you at risk for problems. Contact a health care provider if:  You think you are having a reaction to medicines you have taken.  You have repeated (recurrent) headaches.  You feel dizzy.  You have swelling in your ankles.  You have trouble with your vision. Get help right away  if:  You develop a severe headache or confusion.  You have unusual weakness or numbness, or you feel faint.  You have severe pain in your chest or abdomen.  You vomit repeatedly.  You have trouble breathing. Summary  Hypertension is when the force of blood pumping through your arteries is too strong. If this condition is not controlled, it may put you at risk for serious complications.  Your personal target blood pressure may vary depending on your medical conditions, your age, and other factors. For most people, a normal blood pressure is less than 120/80.  Hypertension is managed by lifestyle changes, medicines, or both. Lifestyle changes include weight loss, eating a healthy, low-sodium diet, exercising more, and limiting alcohol. This information is not intended to replace advice given to you by your health care provider. Make sure you discuss any questions you have with your health care provider. Document Revised: 08/06/2018 Document Reviewed: 03/12/2016 Elsevier Patient Education  [place].  Low-Sodium Eating Plan Sodium, which is an element that makes up salt, helps you maintain a healthy balance of fluids in your body. Too much sodium can increase your blood pressure and cause fluid and waste to be held in your body. Your health care provider or dietitian may recommend following this plan if you have high blood pressure (hypertension), kidney disease, liver disease, or heart failure. Eating less  sodium can help lower your blood pressure, reduce swelling, and protect your heart, liver, and kidneys. What are tips for following this plan? General guidelines  Most people on this plan should limit their sodium intake to 1,500-2,000 mg (milligrams) of sodium each day. Reading food labels   The Nutrition Facts label lists the amount of sodium in one serving of the food. If you eat more than one serving, you must multiply the listed amount of sodium by the number of  servings.  Choose foods with less than 140 mg of sodium per serving.  Avoid foods with 300 mg of sodium or more per serving. Shopping  Look for lower-sodium products, often labeled as "low-sodium" or "no salt added."  Always check the sodium content even if foods are labeled as "unsalted" or "no salt added".  Buy fresh foods. ? Avoid canned foods and premade or frozen meals. ? Avoid canned, cured, or processed meats  Buy breads that have less than 80 mg of sodium per slice. Cooking  Eat more home-cooked food and less restaurant, buffet, and fast food.  Avoid adding salt when cooking. Use salt-free seasonings or herbs instead of table salt or sea salt. Check with your health care provider or pharmacist before using salt substitutes.  Cook with plant-based oils, such as canola, sunflower, or olive oil. Meal planning  When eating at a restaurant, ask that your food be prepared with less salt or no salt, if possible.  Avoid foods that contain MSG (monosodium glutamate). MSG is sometimes added to [place] food, bouillon, and some canned foods. What foods are recommended? The items listed may not be a complete list. Talk with your dietitian about what dietary choices are best for you. Grains Low-sodium cereals, including oats, puffed wheat and rice, and shredded wheat. Low-sodium crackers. Unsalted rice. Unsalted pasta. Low-sodium bread. Whole-grain breads and whole-grain pasta. Vegetables Fresh or frozen vegetables. "No salt added" canned vegetables. "No salt added" tomato sauce and paste. Low-sodium or reduced-sodium tomato and vegetable juice. Fruits Fresh, frozen, or canned fruit. Fruit juice. Meats and other protein foods Fresh or frozen (no salt added) meat, poultry, seafood, and fish. Low-sodium canned tuna and salmon. Unsalted nuts. Dried peas, beans, and lentils without added salt. Unsalted canned beans. Eggs. Unsalted nut butters. Dairy Milk. Soy milk. Cheese that is  naturally low in sodium, such as ricotta cheese, fresh mozzarella, or Swiss cheese Low-sodium or reduced-sodium cheese. Cream cheese. Yogurt. Fats and oils Unsalted butter. Unsalted margarine with no trans fat. Vegetable oils such as canola or olive oils. Seasonings and other foods Fresh and dried herbs and spices. Salt-free seasonings. Low-sodium mustard and ketchup. Sodium-free salad dressing. Sodium-free light mayonnaise. Fresh or refrigerated horseradish. Lemon juice. Vinegar. Homemade, reduced-sodium, or low-sodium soups. Unsalted popcorn and pretzels. Low-salt or salt-free chips. What foods are not recommended? The items listed may not be a complete list. Talk with your dietitian about what dietary choices are best for you. Grains Instant hot cereals. Bread stuffing, pancake, and biscuit mixes. Croutons. Seasoned rice or pasta mixes. Noodle soup cups. Boxed or frozen macaroni and cheese. Regular salted crackers. Self-rising flour. Vegetables Sauerkraut, pickled vegetables, and relishes. Olives. [place] fries. Onion rings. Regular canned vegetables (not low-sodium or reduced-sodium). Regular canned tomato sauce and paste (not low-sodium or reduced-sodium). Regular tomato and vegetable juice (not low-sodium or reduced-sodium). Frozen vegetables in sauces. Meats and other protein foods Meat or fish that is salted, canned, smoked, spiced, or pickled. Bacon, ham, sausage, hotdogs, corned beef, chipped beef,  packaged lunch meats, salt pork, jerky, pickled herring, anchovies, regular canned tuna, sardines, salted nuts. Dairy Processed cheese and cheese spreads. Cheese curds. Blue cheese. Feta cheese. String cheese. Regular cottage cheese. Buttermilk. Canned milk. Fats and oils Salted butter. Regular margarine. Ghee. Bacon fat. Seasonings and other foods Onion salt, garlic salt, seasoned salt, table salt, and sea salt. Canned and packaged gravies. Worcestershire sauce. Tartar sauce. Barbecue sauce.  Teriyaki sauce. Soy sauce, including reduced-sodium. Steak sauce. Fish sauce. Oyster sauce. Cocktail sauce. Horseradish that you find on the shelf. Regular ketchup and mustard. Meat flavorings and tenderizers. Bouillon cubes. Hot sauce and Tabasco sauce. Premade or packaged marinades. Premade or packaged taco seasonings. Relishes. Regular salad dressings. Salsa. Potato and tortilla chips. Corn chips and puffs. Salted popcorn and pretzels. Canned or dried soups. Pizza. Frozen entrees and pot pies. Summary  Eating less sodium can help lower your blood pressure, reduce swelling, and protect your heart, liver, and kidneys.  Most people on this plan should limit their sodium intake to 1,500-2,000 mg (milligrams) of sodium each day.  Canned, boxed, and frozen foods are high in sodium. Restaurant foods, fast foods, and pizza are also very high in sodium. You also get sodium by adding salt to food.  Try to cook at home, eat more fresh fruits and vegetables, and eat less fast food, canned, processed, or prepared foods. This information is not intended to replace advice given to you by your health care provider. Make sure you discuss any questions you have with your health care provider. Document Revised: 03/27/2017 Document Reviewed: 04/07/2016 Elsevier Patient Education  [place].  Hypertension, Adult High blood pressure (hypertension) is when the force of blood pumping through the arteries is too strong. The arteries are the blood vessels that carry blood from the heart throughout the body. Hypertension forces the heart to work harder to pump blood and may cause arteries to become narrow or stiff. Untreated or uncontrolled hypertension can cause a heart attack, heart failure, a stroke, kidney disease, and other problems. A blood pressure reading consists of a higher number over a lower number. Ideally, your blood pressure should be below 120/80. The first ("top") number is called the systolic  pressure. It is a measure of the pressure in your arteries as your heart beats. The second ("bottom") number is called the diastolic pressure. It is a measure of the pressure in your arteries as the heart relaxes. What are the causes? The exact cause of this condition is not known. There are some conditions that result in or are related to high blood pressure. What increases the risk? Some risk factors for high blood pressure are under your control. The following factors may make you more likely to develop this condition:  Smoking.  Having type 2 diabetes mellitus, high cholesterol, or both.  Not getting enough exercise or physical activity.  Being overweight.  Having too much fat, sugar, calories, or salt (sodium) in your diet.  Drinking too much alcohol. Some risk factors for high blood pressure may be difficult or impossible to change. Some of these factors include:  Having chronic kidney disease.  Having a family history of high blood pressure.  Age. Risk increases with age.  Race. You may be at higher risk if you are African American.  Gender. Men are at higher risk than women before age 22. After age 53, women are at higher risk than men.  Having obstructive sleep apnea.  Stress. What are the signs or symptoms? High  blood pressure may not cause symptoms. Very high blood pressure (hypertensive crisis) may cause:  Headache.  Anxiety.  Shortness of breath.  Nosebleed.  Nausea and vomiting.  Vision changes.  Severe chest pain.  Seizures. How is this diagnosed? This condition is diagnosed by measuring your blood pressure while you are seated, with your arm resting on a flat surface, your legs uncrossed, and your feet flat on the floor. The cuff of the blood pressure monitor will be placed directly against the skin of your upper arm at the level of your heart. It should be measured at least twice using the same arm. Certain conditions can cause a difference in blood  pressure between your right and left arms. Certain factors can cause blood pressure readings to be lower or higher than normal for a short period of time:  When your blood pressure is higher when you are in a health care provider's office than when you are at home, this is called white coat hypertension. Most people with this condition do not need medicines.  When your blood pressure is higher at home than when you are in a health care provider's office, this is called masked hypertension. Most people with this condition may need medicines to control blood pressure. If you have a high blood pressure reading during one visit or you have normal blood pressure with other risk factors, you may be asked to:  Return on a different day to have your blood pressure checked again.  Monitor your blood pressure at home for 1 week or longer. If you are diagnosed with hypertension, you may have other blood or imaging tests to help your health care provider understand your overall risk for other conditions. How is this treated? This condition is treated by making healthy lifestyle changes, such as eating healthy foods, exercising more, and reducing your alcohol intake. Your health care provider may prescribe medicine if lifestyle changes are not enough to get your blood pressure under control, and if:  Your systolic blood pressure is above 130.  Your diastolic blood pressure is above 80. Your personal target blood pressure may vary depending on your medical conditions, your age, and other factors. Follow these instructions at home: Eating and drinking   Eat a diet that is high in fiber and potassium, and low in sodium, added sugar, and fat. An example eating plan is called the DASH (Dietary Approaches to Stop Hypertension) diet. To eat this way: ? Eat plenty of fresh fruits and vegetables. Try to fill one half of your plate at each meal with fruits and vegetables. ? Eat whole grains, such as whole-wheat  pasta, brown rice, or whole-grain bread. Fill about one fourth of your plate with whole grains. ? Eat or drink low-fat dairy products, such as skim milk or low-fat yogurt. ? Avoid fatty cuts of meat, processed or cured meats, and poultry with skin. Fill about one fourth of your plate with lean proteins, such as fish, chicken without skin, beans, eggs, or tofu. ? Avoid pre-made and processed foods. These tend to be higher in sodium, added sugar, and fat.  Reduce your daily sodium intake. Most people with hypertension should eat less than 1,500 mg of sodium a day.  Do not drink alcohol if: ? Your health care provider tells you not to drink. ? You are pregnant, may be pregnant, or are planning to become pregnant.  If you drink alcohol: ? Limit how much you use to:  0-1 drink a day for women.  0-2 drinks a day for men. ? Be aware of how much alcohol is in your drink. In the [place] one drink equals one 12 oz bottle of beer (355 mL), one 5 oz glass of wine (148 mL), or one 1 oz glass of hard liquor (44 mL). Lifestyle   Work with your health care provider to maintain a healthy body weight or to lose weight. Ask what an ideal weight is for you.  Get at least 30 minutes of exercise most days of the week. Activities may include walking, swimming, or biking.  Include exercise to strengthen your muscles (resistance exercise), such as Pilates or lifting weights, as part of your weekly exercise routine. Try to do these types of exercises for 30 minutes at least 3 days a week.  Do not use any products that contain nicotine or tobacco, such as cigarettes, e-cigarettes, and chewing tobacco. If you need help quitting, ask your health care provider.  Monitor your blood pressure at home as told by your health care provider.  Keep all follow-up visits as told by your health care provider. This is important. Medicines  Take over-the-counter and prescription medicines only as told by your health care  provider. Follow directions carefully. Blood pressure medicines must be taken as prescribed.  Do not skip doses of blood pressure medicine. Doing this puts you at risk for problems and can make the medicine less effective.  Ask your health care provider about side effects or reactions to medicines that you should watch for. Contact a health care provider if you:  Think you are having a reaction to a medicine you are taking.  Have headaches that keep coming back (recurring).  Feel dizzy.  Have swelling in your ankles.  Have trouble with your vision. Get help right away if you:  Develop a severe headache or confusion.  Have unusual weakness or numbness.  Feel faint.  Have severe pain in your chest or abdomen.  Vomit repeatedly.  Have trouble breathing. Summary  Hypertension is when the force of blood pumping through your arteries is too strong. If this condition is not controlled, it may put you at risk for serious complications.  Your personal target blood pressure may vary depending on your medical conditions, your age, and other factors. For most people, a normal blood pressure is less than 120/80.  Hypertension is treated with lifestyle changes, medicines, or a combination of both. Lifestyle changes include losing weight, eating a healthy, low-sodium diet, exercising more, and limiting alcohol. This information is not intended to replace advice given to you by your health care provider. Make sure you discuss any questions you have with your health care provider. Document Revised: 12/23/2017 Document Reviewed: 12/23/2017 Elsevier Patient Education  [place] DASH stands for "Dietary Approaches to Stop Hypertension." The DASH eating plan is a healthy eating plan that has been shown to reduce high blood pressure (hypertension). It may also reduce your risk for type 2 diabetes, heart disease, and stroke. The DASH eating plan may also help with weight  loss. What are tips for following this plan?  General guidelines  Avoid eating more than 2,300 mg (milligrams) of salt (sodium) a day. If you have hypertension, you may need to reduce your sodium intake to 1,500 mg a day.  Limit alcohol intake to no more than 1 drink a day for nonpregnant women and 2 drinks a day for men. One drink equals 12 oz of beer, 5 oz of wine,  or 1 oz of hard liquor.  Work with your health care provider to maintain a healthy body weight or to lose weight. Ask what an ideal weight is for you.  Get at least 30 minutes of exercise that causes your heart to beat faster (aerobic exercise) most days of the week. Activities may include walking, swimming, or biking.  Work with your health care provider or diet and nutrition specialist (dietitian) to adjust your eating plan to your individual calorie needs. Reading food labels   Check food labels for the amount of sodium per serving. Choose foods with less than 5 percent of the Daily Value of sodium. Generally, foods with less than 300 mg of sodium per serving fit into this eating plan.  To find whole grains, look for the word "whole" as the first word in the ingredient list. Shopping  Buy products labeled as "low-sodium" or "no salt added."  Buy fresh foods. Avoid canned foods and premade or frozen meals. Cooking  Avoid adding salt when cooking. Use salt-free seasonings or herbs instead of table salt or sea salt. Check with your health care provider or pharmacist before using salt substitutes.  Do not fry foods. Cook foods using healthy methods such as baking, boiling, grilling, and broiling instead.  Cook with heart-healthy oils, such as olive, canola, soybean, or sunflower oil. Meal planning  Eat a balanced diet that includes: ? 5 or more servings of fruits and vegetables each day. At each meal, try to fill half of your plate with fruits and vegetables. ? Up to 6-8 servings of whole grains each day. ? Less than  6 oz of lean meat, poultry, or fish each day. A 3-oz serving of meat is about the same size as a deck of cards. One egg equals 1 oz. ? 2 servings of low-fat dairy each day. ? A serving of nuts, seeds, or beans 5 times each week. ? Heart-healthy fats. Healthy fats called Omega-3 fatty acids are found in foods such as flaxseeds and coldwater fish, like sardines, salmon, and mackerel.  Limit how much you eat of the following: ? Canned or prepackaged foods. ? Food that is high in trans fat, such as fried foods. ? Food that is high in saturated fat, such as fatty meat. ? Sweets, desserts, sugary drinks, and other foods with added sugar. ? Full-fat dairy products.  Do not salt foods before eating.  Try to eat at least 2 vegetarian meals each week.  Eat more home-cooked food and less restaurant, buffet, and fast food.  When eating at a restaurant, ask that your food be prepared with less salt or no salt, if possible. What foods are recommended? The items listed may not be a complete list. Talk with your dietitian about what dietary choices are best for you. Grains Whole-grain or whole-wheat bread. Whole-grain or whole-wheat pasta. Brown rice. [place]. Bulgur. Whole-grain and low-sodium cereals. Pita bread. Low-fat, low-sodium crackers. Whole-wheat flour tortillas. Vegetables Fresh or frozen vegetables (raw, steamed, roasted, or grilled). Low-sodium or reduced-sodium tomato and vegetable juice. Low-sodium or reduced-sodium tomato sauce and tomato paste. Low-sodium or reduced-sodium canned vegetables. Fruits All fresh, dried, or frozen fruit. Canned fruit in natural juice (without added sugar). Meat and other protein foods Skinless chicken or [place]. Ground chicken or [place]. Pork with fat trimmed off. Fish and seafood. Egg whites. Dried beans, peas, or lentils. Unsalted nuts, nut butters, and seeds. Unsalted canned beans. Lean cuts of beef with fat trimmed off. Low-sodium, lean deli  meat. Dairy Low-fat (1%) or fat-free (skim) milk. Fat-free, low-fat, or reduced-fat cheeses. Nonfat, low-sodium ricotta or cottage cheese. Low-fat or nonfat yogurt. Low-fat, low-sodium cheese. Fats and oils Soft margarine without trans fats. Vegetable oil. Low-fat, reduced-fat, or light mayonnaise and salad dressings (reduced-sodium). Canola, safflower, olive, soybean, and sunflower oils. Avocado. Seasoning and other foods Herbs. Spices. Seasoning mixes without salt. Unsalted popcorn and pretzels. Fat-free sweets. What foods are not recommended? The items listed may not be a complete list. Talk with your dietitian about what dietary choices are best for you. Grains Baked goods made with fat, such as croissants, muffins, or some breads. Dry pasta or rice meal packs. Vegetables Creamed or fried vegetables. Vegetables in a cheese sauce. Regular canned vegetables (not low-sodium or reduced-sodium). Regular canned tomato sauce and paste (not low-sodium or reduced-sodium). Regular tomato and vegetable juice (not low-sodium or reduced-sodium). Angie Fava. Olives. Fruits Canned fruit in a light or heavy syrup. Fried fruit. Fruit in cream or butter sauce. Meat and other protein foods Fatty cuts of meat. Ribs. Fried meat. Berniece Salines. Sausage. Bologna and other processed lunch meats. Salami. Fatback. Hotdogs. Bratwurst. Salted nuts and seeds. Canned beans with added salt. Canned or smoked fish. Whole eggs or egg yolks. Chicken or [place] with skin. Dairy Whole or 2% milk, cream, and half-and-half. Whole or full-fat cream cheese. Whole-fat or sweetened yogurt. Full-fat cheese. Nondairy creamers. Whipped toppings. Processed cheese and cheese spreads. Fats and oils Butter. Stick margarine. Lard. Shortening. Ghee. Bacon fat. Tropical oils, such as coconut, palm kernel, or palm oil. Seasoning and other foods Salted popcorn and pretzels. Onion salt, garlic salt, seasoned salt, table salt, and sea salt. Worcestershire  sauce. Tartar sauce. Barbecue sauce. Teriyaki sauce. Soy sauce, including reduced-sodium. Steak sauce. Canned and packaged gravies. Fish sauce. Oyster sauce. Cocktail sauce. Horseradish that you find on the shelf. Ketchup. Mustard. Meat flavorings and tenderizers. Bouillon cubes. Hot sauce and Tabasco sauce. Premade or packaged marinades. Premade or packaged taco seasonings. Relishes. Regular salad dressings. Where to find more information:  National Heart, Lung, and [place]: https://wilson-eaton.com/  American Heart Association: www.heart.org Summary  The DASH eating plan is a healthy eating plan that has been shown to reduce high blood pressure (hypertension). It may also reduce your risk for type 2 diabetes, heart disease, and stroke.  With the DASH eating plan, you should limit salt (sodium) intake to 2,300 mg a day. If you have hypertension, you may need to reduce your sodium intake to 1,500 mg a day.  When on the DASH eating plan, aim to eat more fresh fruits and vegetables, whole grains, lean proteins, low-fat dairy, and heart-healthy fats.  Work with your health care provider or diet and nutrition specialist (dietitian) to adjust your eating plan to your individual calorie needs. This information is not intended to replace advice given to you by your health care provider. Make sure you discuss any questions you have with your health care provider. Document Revised: 03/27/2017 Document Reviewed: 04/07/2016 Elsevier Patient Education  2020 [employer].

## 2019-06-20 ENCOUNTER — Encounter: Payer: Self-pay | Admitting: Internal Medicine

## 2019-06-21 ENCOUNTER — Other Ambulatory Visit: Payer: Self-pay | Admitting: Internal Medicine

## 2019-06-21 ENCOUNTER — Telehealth: Payer: Self-pay | Admitting: Internal Medicine

## 2019-06-21 DIAGNOSIS — I10 Essential (primary) hypertension: Secondary | ICD-10-CM

## 2019-06-21 MED ORDER — HYDRALAZINE HCL 10 MG PO TABS: 10.0000 mg | ORAL_TABLET | Freq: Three times a day (TID) | ORAL | 0 refills | Status: DC

## 2019-06-21 NOTE — Telephone Encounter (Signed)
Fax note 06/01/19 to Dr. Candiss Norse renal [facility] [place]  Would he consider hydralazine BP uncontrolled and could not tolerate other meds? Put this on cover and need reply   [place]

## 2019-06-22 NOTE — Telephone Encounter (Signed)
Faxed

## 2019-07-01 ENCOUNTER — Ambulatory Visit (INDEPENDENT_AMBULATORY_CARE_PROVIDER_SITE_OTHER): Payer: Medicare Other | Admitting: Vascular Surgery

## 2019-07-01 ENCOUNTER — Encounter (INDEPENDENT_AMBULATORY_CARE_PROVIDER_SITE_OTHER): Payer: Self-pay | Admitting: Vascular Surgery

## 2019-07-01 ENCOUNTER — Ambulatory Visit (INDEPENDENT_AMBULATORY_CARE_PROVIDER_SITE_OTHER): Payer: Medicare Other

## 2019-07-01 ENCOUNTER — Other Ambulatory Visit: Payer: Self-pay

## 2019-07-01 VITALS — BP 160/69 | HR 60 | Resp 10 | Ht 76.0 in | Wt 221.0 lb

## 2019-07-01 DIAGNOSIS — I701 Atherosclerosis of renal artery: Secondary | ICD-10-CM

## 2019-07-01 DIAGNOSIS — I6523 Occlusion and stenosis of bilateral carotid arteries: Secondary | ICD-10-CM

## 2019-07-01 DIAGNOSIS — I10 Essential (primary) hypertension: Secondary | ICD-10-CM

## 2019-07-01 NOTE — Progress Notes (Signed)
MRN : [id number]  Keith Cruz is a 79 y.o. (06/23/1940) male who presents with chief complaint of  Chief Complaint  Patient presents with  . Follow-up    U/S follow up  .  History of Present Illness: Patient returns today in follow up of multiple vascular issues.  He has been having a little more dizziness but no focal neurologic symptoms such as arm or leg weakness or numbness, speech or swallowing difficulty, or temporary monocular blindness.  His carotid duplex today shows an occlusion of his right carotid endarterectomy that was done roughly 20-30 years ago by a surgeon who has since passed away.  His left carotid artery endarterectomy that I performed about a decade ago is patent with mildly elevated velocities in the lower end of the 40 to 59% range, and some of that increased velocity may be due to compensatory flow. He is also having a little more difficulty with his blood pressure and is added a blood pressure agent.  We have followed him for some renal artery disease over time as well.  Velocities in the renal artery would suggest 1 to 59% stenosis bilaterally without progression from previous studies.  Current Outpatient Medications  Medication Sig Dispense Refill  . ACCU-CHEK AVIVA PLUS test strip CHECK BLOOD SUGAR 1 X DAILY 100 strip 5  . albuterol (VENTOLIN HFA) 108 (90 Base) MCG/ACT inhaler Inhale 1-2 puffs into the lungs every 4 (four) hours as needed for wheezing or shortness of breath. 1 Inhaler 12  . aspirin EC 81 MG tablet Take 81 mg by mouth daily.    . Cholecalciferol (VITAMIN D3) 2000 UNITS capsule Take 2,000 Units by mouth daily. am    . clopidogrel (PLAVIX) 75 MG tablet Take 1 tablet (75 mg total) by mouth daily. 90 tablet 3  . cyclobenzaprine (FLEXERIL) 5 MG tablet Take 1-2 tablets (5-10 mg total) by mouth at bedtime as needed for muscle spasms. 60 tablet 2  . fexofenadine (ALLEGRA) 180 MG tablet Take 1 tablet (180 mg total) by mouth daily as needed for allergies  or rhinitis. 90 tablet 3  . fluticasone (FLOVENT HFA) 110 MCG/ACT inhaler Inhale 2 puffs into the lungs 2 (two) times daily. Rinse your mouth 1 Inhaler 12  . folic acid (FOLVITE) 1 MG tablet Take 0.8 mg by mouth daily. am    . hydrALAZINE (APRESOLINE) 10 MG tablet Take 1 tablet (10 mg total) by mouth 3 (three) times daily. 90 tablet 0  . metoprolol tartrate (LOPRESSOR) 50 MG tablet Take 1 tablet (50 mg total) by mouth 2 (two) times daily. 180 tablet 3  . Multiple Vitamins-Minerals (CENTRUM SILVER 50+MEN PO) Take by mouth.    . pantoprazole (PROTONIX) 40 MG tablet TAKE 1 TABLET (40 MG TOTAL) BY MOUTH 2 (TWO) TIMES DAILY. AM AND DINNER 180 tablet 1  . simvastatin (ZOCOR) 40 MG tablet Take 1 tablet (40 mg total) by mouth daily. am 90 tablet 3   Current Facility-Administered Medications  Medication Dose Route Frequency Provider Last Rate Last Admin  . ipratropium-albuterol (DUONEB) 0.5-2.5 (3) MG/3ML nebulizer solution 3 mL  3 mL Nebulization Once Olin Hauser, DO        Past Medical History:  Diagnosis Date  . Arthritis    back  . Asthma   . Atherosclerosis of aorta ([place])   . Atherosclerosis of aorta ([place])   . Bronchitis 05/2017   chronic  . CAD (coronary artery disease)   . Carotid artery disease ([place])   .  Carotid stenosis   . Carotid stenosis, bilateral   . Chronic bronchitis ([place])   . Chronic cough   . COPD (chronic obstructive pulmonary disease) ([place])   . Dysrhythmia   . GERD (gastroesophageal reflux disease)   . Heart murmur   . HOH (hard of hearing)    hearing aids  . Hypercholesteremia   . Hypertension   . Kidney disease    stent  . Macular degeneration of left eye   . Myocardial infarction ([place]) 2016  . Pre-diabetes   . Seasonal allergies   . Seizure ([place])   . Seizures (HCC)    viral meningitis  . Stroke [facility])    1991 and imaging 05/12/15 old strokes  . TIA (transient ischemic attack) 1991   x2  . Uses hearing aid   . Viral meningitis 2017  . Viral  meningitis   . Wears dentures    upper and lower    Past Surgical History:  Procedure Laterality Date  . CAROTID ENDARTERECTOMY Left 2014  . CAROTID ENDARTERECTOMY Right 1991  . CATARACT EXTRACTION W/PHACO Left 06/24/2017   Procedure: CATARACT EXTRACTION PHACO AND INTRAOCULAR LENS PLACEMENT ([place]) LEFT BORDERLINE DIABETIC;  Surgeon: Leandrew Koyanagi, MD;  Location: [facility];  Service: Ophthalmology;  Laterality: Left;  . CATARACT EXTRACTION W/PHACO Right 07/22/2017   Procedure: CATARACT EXTRACTION PHACO AND INTRAOCULAR LENS PLACEMENT ([place]) RIGHT BORDERLINE DIABETIC;  Surgeon: Leandrew Koyanagi, MD;  Location: [place];  Service: Ophthalmology;  Laterality: Right;  . CORONARY ARTERY BYPASS GRAFT     x5  . SKIN LESION EXCISION Left    left ear  . URETERAL STENT PLACEMENT       Social History   Tobacco Use  . Smoking status: Former Smoker    Packs/day: 2.00    Years: 10.00    Pack years: 20.00    Types: Cigarettes    Quit date: 04/28/1970    Years since quitting: 49.2  . Smokeless tobacco: Former Network engineer Use Topics  . Alcohol use: No    Alcohol/week: 0.0 standard drinks    Comment: previous  . Drug use: No    Family History  Problem Relation Age of Onset  . Stroke Mother   . Heart attack Mother   . Stroke Father   . Stroke Paternal Grandmother   . Stroke Paternal Grandfather   . Alzheimer's disease Paternal Grandfather   . Stroke Sister   . Alzheimer's disease Sister   . Stroke Brother   . Alzheimer's disease Maternal Grandmother      Allergies  Allergen Reactions  . Fexofenadine-Pseudoephed Er Swelling    Tongue and face. Only with D product. Tolerates plain allegra at home.  .   . Nifedipine Swelling    Tongue and face    . Allegra  [Fexofenadine] Swelling    Tongue and face. Only with D product. Tolerates plain allegra at home.   Jearl Klinefelter Ellipta [Umeclidinium-Vilanterol]   . Prednisone Other (See Comments)    Oral  prednisone. Confusion. Mental status changes     REVIEW OF SYSTEMS (Negative unless checked)  Constitutional: [] Weight loss  [] Fever  [] Chills Cardiac: [] Chest pain   [] Chest pressure   [x] Palpitations   [] Shortness of breath when laying flat   [] Shortness of breath at rest   [x] Shortness of breath with exertion. Vascular:  [] Pain in legs with walking   [] Pain in legs at rest   [] Pain in legs when laying flat   [] Claudication   [] Pain  in feet when walking  [] Pain in feet at rest  [] Pain in feet when laying flat   [] History of DVT   [] Phlebitis   [] Swelling in legs   [] Varicose veins   [] Non-healing ulcers Pulmonary:   [] Uses home oxygen   [] Productive cough   [] Hemoptysis   [] Wheeze  [] COPD   [] Asthma Neurologic:  [] Dizziness  [] Blackouts   [] Seizures   [] History of stroke   [] History of TIA  [] Aphasia   [] Temporary blindness   [] Dysphagia   [] Weakness or numbness in arms   [] Weakness or numbness in legs Musculoskeletal:  [] Arthritis   [] Joint swelling   [] Joint pain   [] Low back pain Hematologic:  [] Easy bruising  [] Easy bleeding   [] Hypercoagulable state   [] Anemic   Gastrointestinal:  [] Blood in stool   [] Vomiting blood  [] Gastroesophageal reflux/heartburn   [] Abdominal pain Genitourinary:  [] Chronic kidney disease   [] Difficult urination  [] Frequent urination  [] Burning with urination   [] Hematuria Skin:  [] Rashes   [] Ulcers   [] Wounds Psychological:  [] History of anxiety   []  History of major depression.  Physical Examination  BP (!) 160/69   Pulse 60   Resp 10   Ht 6\' 4"  (1.93 m)   Wt 221 lb (100.2 kg)   BMI 26.90 kg/m  Gen:  WD/WN, NAD Head: [place]/AT, No temporalis wasting. Ear/Nose/Throat: Hearing grossly intact, nares w/o erythema or drainage Eyes: Conjunctiva clear. Sclera non-icteric Neck: Supple.  Trachea midline.  Right carotid bruit is present Pulmonary:  Good air movement, no use of accessory muscles.  Cardiac: RRR, no JVD Vascular:  Vessel Right Left  Radial Palpable  Palpable                        Musculoskeletal: M/S 5/5 throughout.  No deformity or atrophy.  edema. Neurologic: Sensation grossly intact in extremities.  Symmetrical.  Speech is fluent.  Psychiatric: Judgment intact, Mood & affect appropriate for pt's clinical situation. Dermatologic: No rashes or ulcers noted.  No cellulitis or open wounds.       Labs No results found for this or any previous visit (from the past 2160 hour(s)).  Radiology No results found.  Assessment/Plan Benign essential HTN blood pressure control important in reducing the progression of atherosclerotic disease. On appropriate oral medications.   Bilateral carotid artery stenosis His carotid duplex today shows an occlusion of his right carotid endarterectomy that was done roughly 20-30 years ago by a surgeon who has since passed away.  His left carotid artery endarterectomy that I performed about a decade ago is patent with mildly elevated velocities in the lower end of the 40 to 59% range, and some of that increased velocity may be due to compensatory flow.  This is a new finding and progression of disease from his previous study a year ago.  At this point, intervention is not likely to be of benefit at this time but we will need to shorten his interval follow-up and I will plan to see him back in 6 months.  Renal artery stenosis (HCC) Velocities in the renal artery would suggest 1 to 59% stenosis bilaterally without progression from previous studies.  He is status post right renal artery intervention in the past.  No role for intervention at this time.  Continue to follow on an annual basis.    Leotis Pain, MD  07/01/2019 9:54 AM    This note was created with Dragon medical transcription system.  Any errors from  dictation are purely unintentional

## 2019-07-01 NOTE — Assessment & Plan Note (Signed)
His carotid duplex today shows an occlusion of his right carotid endarterectomy that was done roughly 20-30 years ago by a surgeon who has since passed away.  His left carotid artery endarterectomy that I performed about a decade ago is patent with mildly elevated velocities in the lower end of the 40 to 59% range, and some of that increased velocity may be due to compensatory flow.  This is a new finding and progression of disease from his previous study a year ago.  At this point, intervention is not likely to be of benefit at this time but we will need to shorten his interval follow-up and I will plan to see him back in 6 months.

## 2019-07-01 NOTE — Patient Instructions (Signed)
Carotid Artery Disease  Carotid artery disease is the narrowing or blockage of one or both carotid arteries. This condition is also called carotid artery stenosis. The carotid arteries are the two main blood vessels on either side of the neck. They send blood to the brain, other parts of the head, and the neck.  This condition increases your risk for a stroke or a transient ischemic attack (TIA). A TIA is a "mini-stroke" that causes stroke-like symptoms that go away quickly. What are the causes? This condition is mainly caused by a narrowing and hardening of the carotid arteries. The carotid arteries can become narrow or clogged with a buildup of plaque. Plaque includes:  Fat.  Cholesterol.  Calcium.  Other substances. What increases the risk? The following factors may make you more likely to develop this condition:  Having certain medical conditions, such as: ? High cholesterol. ? High blood pressure. ? Diabetes. ? Obesity.  Smoking.  A family history of cardiovascular disease.  Not being active or lack of regular exercise.  Being male. Men have a higher risk of having arteries become narrow and harden earlier in life than women.  Old age. What are the signs or symptoms? This condition may not have any signs or symptoms until a stroke or TIA happens. In some cases, your doctor may be able to hear a whooshing sound. This can suggest a change in blood flow caused by plaque buildup. An eye exam can also help find signs of the condition. How is this treated? This condition may be treated with more than one treatment. Treatment options include:  Lifestyle changes, such as: ? Quitting smoking. ? Getting regular exercise, or getting exercise as told by your doctor. ? Eating a healthy diet. ? Managing stress. ? Keeping a healthy weight.  Medicines to control: ? Blood pressure. ? Cholesterol. ? Blood clotting.  Surgery. You may have: ? A surgery to remove the blockages in  the carotid arteries. ? A procedure in which a small mesh tube (stent) is used to widen the blocked carotid arteries. Follow these instructions at home: Eating and drinking Follow instructions about your diet from your doctor. It is important to follow a healthy diet.  Eat a diet that includes: ? A lot of fresh fruits and vegetables. ? Low-fat (lean) meats.  Avoid these foods: ? Foods that are high in fat. ? Foods that are high in salt (sodium). ? Foods that are fried. ? Foods that are processed. ? Foods that have few good nutrients (poor nutritional value).  Lifestyle   Keep a healthy weight.  Do exercises as told by your doctor to stay active. Each week, you should get one of the following: ? At least 150 minutes of exercise that raises your heart rate and makes you sweat (moderate-intensity exercise). ? At least 75 minutes of exercise that takes a lot of effort.  Do not use any products that contain nicotine or tobacco, such as cigarettes, e-cigarettes, and chewing tobacco. If you need help quitting, ask your doctor.  Do not drink alcohol if: ? Your doctor tells you not to drink. ? You are pregnant, may be pregnant, or are planning to become pregnant.  If you drink alcohol: ? Limit how much you use to:  0-1 drink a day for women.  0-2 drinks a day for men. ? Be aware of how much alcohol is in your drink. In the [place] one drink equals one 12 oz bottle of beer (355 mL), one 5   oz glass of wine (148 mL), or one 1 oz glass of hard liquor (44 mL).  Do not use drugs.  Manage your stress. Ask your doctor for tips on how to do this. General instructions  Take over-the-counter and prescription medicines only as told by your doctor.  Keep all follow-up visits as told by your doctor. This is important. Where to find more information  American Heart Association: www.heart.org Get help right away if:  You have any signs of a stroke. "BE FAST" is an easy way to remember the  main warning signs: ? B - Balance. Signs are dizziness, sudden trouble walking, or loss of balance. ? E - Eyes. Signs are trouble seeing or a change in how you see. ? F - Face. Signs are sudden weakness or loss of feeling of the face, or the face or eyelid drooping on one side. ? A - Arms. Signs are weakness or loss of feeling in an arm. This happens suddenly and usually on one side of the body. ? S - Speech. Signs are sudden trouble speaking, slurred speech, or trouble understanding what people say. ? T - Time. Time to call emergency services. Write down what time symptoms started.  You have other signs of a stroke, such as: ? A sudden, very bad headache with no known cause. ? Feeling like you may vomit (nausea). ? Vomiting. ? A seizure. These symptoms may be an emergency. Do not wait to see if the symptoms will go away. Get medical help right away. Call your local emergency services (911 in the [place] Do not drive yourself to the hospital. Summary  The carotid arteries are blood vessels on both sides of the neck.  If these arteries get smaller or get blocked, you are more likely to have a stroke or a mini-stroke.  This condition can be treated with lifestyle changes, medicines, surgery, or a blend of these treatments.  Get help right away if you have any signs of a stroke. "BE FAST" is an easy way to remember the main warning signs of stroke. This information is not intended to replace advice given to you by your health care provider. Make sure you discuss any questions you have with your health care provider. Document Revised: 11/08/2018 Document Reviewed: 11/08/2018 Elsevier Patient Education  2020 Elsevier Inc.  

## 2019-07-01 NOTE — Assessment & Plan Note (Signed)
Velocities in the renal artery would suggest 1 to 59% stenosis bilaterally without progression from previous studies.  He is status post right renal artery intervention in the past.  No role for intervention at this time.  Continue to follow on an annual basis.

## 2019-07-03 ENCOUNTER — Telehealth: Payer: Self-pay | Admitting: Internal Medicine

## 2019-07-03 NOTE — Telephone Encounter (Signed)
BP still elevated at appt vascular 07/01/19 160/69 Is he taking Hydralazine?   If so does he want to increase the dose to better control blood pressure?   Dr. [facility]

## 2019-07-04 NOTE — Telephone Encounter (Signed)
Patient informed and verbalized understanding.  States he has been taking this medication and is agreeable to go up. Patient's BP this morning was 140/56 this morning, it was 167/79 while we were on the phone.

## 2019-07-04 NOTE — Telephone Encounter (Signed)
Reviewed with Dr. Candiss Norse again we disc hydralazine for him initially then received notification from pharmacy they do not rec. This with medical history due to increased risk of elevated heart rate?   Has he ever tried clonidine?  The issue with this one is if he misses a dose his BP can go even higher so he has to make sure he does not miss a dose  Other than these 2 options we dont have any other options for better bp control   Does he want to try clonidine?

## 2019-07-04 NOTE — Telephone Encounter (Signed)
Patient would like to continue to try what he is on for another week. States his BP is increased during the day but are good in the morning.  Patient states that he had testing done recently and it was revealed that his right carotid artery is 100% closed and the left Carotid Artery is 40% closed. He states this attributes to his elevated BP.   Patient states he will call back in Next Monday with updated BP levels.

## 2019-07-07 ENCOUNTER — Other Ambulatory Visit (INDEPENDENT_AMBULATORY_CARE_PROVIDER_SITE_OTHER): Payer: Medicare Other

## 2019-07-07 ENCOUNTER — Other Ambulatory Visit: Payer: Self-pay

## 2019-07-07 DIAGNOSIS — Z125 Encounter for screening for malignant neoplasm of prostate: Secondary | ICD-10-CM

## 2019-07-07 DIAGNOSIS — N1831 Chronic kidney disease, stage 3a: Secondary | ICD-10-CM

## 2019-07-07 DIAGNOSIS — Z1329 Encounter for screening for other suspected endocrine disorder: Secondary | ICD-10-CM | POA: Diagnosis not present

## 2019-07-07 DIAGNOSIS — N529 Male erectile dysfunction, unspecified: Secondary | ICD-10-CM

## 2019-07-07 DIAGNOSIS — I10 Essential (primary) hypertension: Secondary | ICD-10-CM | POA: Diagnosis not present

## 2019-07-07 DIAGNOSIS — R7303 Prediabetes: Secondary | ICD-10-CM

## 2019-07-07 LAB — LIPID PANEL
Cholesterol: 130 mg/dL (ref 0–200)
HDL: 39.9 mg/dL (ref >39.00)
LDL Cholesterol: 66 mg/dL (ref 0–99)
NonHDL: 89.87
Total CHOL/HDL Ratio: 3
Triglycerides: 117 mg/dL (ref 0.0–149.0)
VLDL: 23.4 mg/dL (ref 0.0–40.0)

## 2019-07-07 LAB — COMPREHENSIVE METABOLIC PANEL
ALT: 23 U/L (ref 0–53)
AST: 25 U/L (ref 0–37)
Albumin: 4.1 g/dL (ref 3.5–5.2)
Alkaline Phosphatase: 48 U/L (ref 39–117)
BUN: 23 mg/dL (ref 6–23)
CO2: 27 mEq/L (ref 19–32)
Calcium: 9.5 mg/dL (ref 8.4–10.5)
Chloride: 102 mEq/L (ref 96–112)
Creatinine, Ser: 1.49 mg/dL (ref 0.40–1.50)
GFR: 45.48 mL/min — ABNORMAL LOW (ref >60.00)
Glucose, Bld: 125 mg/dL — ABNORMAL HIGH (ref 70–99)
Potassium: 4.6 mEq/L (ref 3.5–5.1)
Sodium: 139 mEq/L (ref 135–145)
Total Bilirubin: 0.9 mg/dL (ref 0.2–1.2)
Total Protein: 7.3 g/dL (ref 6.0–8.3)

## 2019-07-07 LAB — CBC WITH DIFFERENTIAL/PLATELET
Basophils Absolute: 0.1 10*3/uL (ref 0.0–0.1)
Basophils Relative: 0.8 % (ref 0.0–3.0)
Eosinophils Absolute: 0.1 10*3/uL (ref 0.0–0.7)
Eosinophils Relative: 1.4 % (ref 0.0–5.0)
HCT: 44.8 % (ref 39.0–52.0)
Hemoglobin: 15.3 g/dL (ref 13.0–17.0)
Lymphocytes Relative: 20.1 % (ref 12.0–46.0)
Lymphs Abs: 1.5 10*3/uL (ref 0.7–4.0)
MCHC: 34.2 g/dL (ref 30.0–36.0)
MCV: 104.5 fl — ABNORMAL HIGH (ref 78.0–100.0)
Monocytes Absolute: 0.6 10*3/uL (ref 0.1–1.0)
Monocytes Relative: 8.5 % (ref 3.0–12.0)
Neutro Abs: 5.2 10*3/uL (ref 1.4–7.7)
Neutrophils Relative %: 69.2 % (ref 43.0–77.0)
Platelets: 231 10*3/uL (ref 150.0–400.0)
RBC: 4.28 Mil/uL (ref 4.22–5.81)
RDW: 13.5 % (ref 11.5–15.5)
WBC: 7.5 10*3/uL (ref 4.0–10.5)

## 2019-07-07 LAB — HEMOGLOBIN A1C: Hgb A1c MFr Bld: 6.4 % (ref 4.6–6.5)

## 2019-07-07 LAB — TSH: TSH: 2.77 u[IU]/mL (ref 0.35–4.50)

## 2019-07-07 LAB — PSA: PSA: 0.29 ng/mL (ref 0.10–4.00)

## 2019-07-08 LAB — URINALYSIS, ROUTINE W REFLEX MICROSCOPIC
Bilirubin Urine: NEGATIVE
Glucose, UA: NEGATIVE
Hgb urine dipstick: NEGATIVE
Ketones, ur: NEGATIVE
Leukocytes,Ua: NEGATIVE
Nitrite: NEGATIVE
Protein, ur: NEGATIVE
Specific Gravity, Urine: 1.013 (ref 1.001–1.03)
pH: 6.5 (ref 5.0–8.0)

## 2019-07-11 ENCOUNTER — Encounter: Payer: Self-pay | Admitting: Internal Medicine

## 2019-07-11 NOTE — Telephone Encounter (Signed)
Noted assess BP 07/11/19

## 2019-07-13 ENCOUNTER — Telehealth: Payer: Self-pay | Admitting: Internal Medicine

## 2019-07-13 NOTE — Telephone Encounter (Signed)
BP is improving but it can cause elevated heart rate which would not be good which the plaque build in in his arteries I.e hydralazine does this side effect of possible elevated HR   What has his heart rate been?  BP still elevated at times we could go up to hydralazine 25 mg 3x per day with him monitoring his heart rate   Hydralazine can cause elevated HR  For this reason clonidine may be better  We can try this medication if he would like side effects dizziness, fatigue and dry mouth    What would he like to do?   [place]

## 2019-07-14 ENCOUNTER — Other Ambulatory Visit: Payer: Self-pay | Admitting: Internal Medicine

## 2019-07-14 DIAGNOSIS — I10 Essential (primary) hypertension: Secondary | ICD-10-CM

## 2019-07-14 MED ORDER — HYDRALAZINE HCL 25 MG PO TABS: 25.0000 mg | ORAL_TABLET | Freq: Three times a day (TID) | ORAL | 3 refills | Status: DC

## 2019-07-14 NOTE — Telephone Encounter (Signed)
Patient's levels have been: (date BP HR) 3/18 144/61 60 3/17 142/60 62 3/16 146/61 52 3/15 158/56 59 3/14 168/64 61  Patient is agreeable to upping his Hydralazine and monitoring his heart rate (HR).  Please advise

## 2019-07-14 NOTE — Telephone Encounter (Signed)
Sent increased hydralazine 25 tid  Monitor HR  TMS

## 2019-07-17 ENCOUNTER — Ambulatory Visit
Admission: EM | Admit: 2019-07-17 | Discharge: 2019-07-17 | Disposition: A | Discharge status: Home / Self Care | Payer: Medicare Other | Attending: Emergency Medicine | Admitting: Emergency Medicine

## 2019-07-17 ENCOUNTER — Other Ambulatory Visit: Payer: Self-pay

## 2019-07-17 ENCOUNTER — Encounter: Payer: Self-pay | Admitting: Emergency Medicine

## 2019-07-17 DIAGNOSIS — I701 Atherosclerosis of renal artery: Secondary | ICD-10-CM | POA: Diagnosis not present

## 2019-07-17 DIAGNOSIS — R062 Wheezing: Secondary | ICD-10-CM

## 2019-07-17 DIAGNOSIS — Z8673 Personal history of transient ischemic attack (TIA), and cerebral infarction without residual deficits: Secondary | ICD-10-CM | POA: Diagnosis not present

## 2019-07-17 DIAGNOSIS — Z87891 Personal history of nicotine dependence: Secondary | ICD-10-CM | POA: Insufficient documentation

## 2019-07-17 DIAGNOSIS — E785 Hyperlipidemia, unspecified: Secondary | ICD-10-CM | POA: Insufficient documentation

## 2019-07-17 DIAGNOSIS — R05 Cough: Secondary | ICD-10-CM

## 2019-07-17 DIAGNOSIS — Z7901 Long term (current) use of anticoagulants: Secondary | ICD-10-CM | POA: Diagnosis not present

## 2019-07-17 DIAGNOSIS — J0100 Acute maxillary sinusitis, unspecified: Secondary | ICD-10-CM | POA: Diagnosis not present

## 2019-07-17 DIAGNOSIS — M1990 Unspecified osteoarthritis, unspecified site: Secondary | ICD-10-CM | POA: Diagnosis not present

## 2019-07-17 DIAGNOSIS — N183 Chronic kidney disease, stage 3 unspecified: Secondary | ICD-10-CM | POA: Insufficient documentation

## 2019-07-17 DIAGNOSIS — R7303 Prediabetes: Secondary | ICD-10-CM | POA: Insufficient documentation

## 2019-07-17 DIAGNOSIS — I6523 Occlusion and stenosis of bilateral carotid arteries: Secondary | ICD-10-CM | POA: Diagnosis not present

## 2019-07-17 DIAGNOSIS — Z79899 Other long term (current) drug therapy: Secondary | ICD-10-CM | POA: Diagnosis not present

## 2019-07-17 DIAGNOSIS — I129 Hypertensive chronic kidney disease with stage 1 through stage 4 chronic kidney disease, or unspecified chronic kidney disease: Secondary | ICD-10-CM | POA: Insufficient documentation

## 2019-07-17 DIAGNOSIS — Z951 Presence of aortocoronary bypass graft: Secondary | ICD-10-CM | POA: Insufficient documentation

## 2019-07-17 DIAGNOSIS — I252 Old myocardial infarction: Secondary | ICD-10-CM | POA: Insufficient documentation

## 2019-07-17 DIAGNOSIS — R0602 Shortness of breath: Secondary | ICD-10-CM

## 2019-07-17 DIAGNOSIS — I4891 Unspecified atrial fibrillation: Secondary | ICD-10-CM | POA: Diagnosis not present

## 2019-07-17 DIAGNOSIS — Z20822 Contact with and (suspected) exposure to covid-19: Secondary | ICD-10-CM | POA: Insufficient documentation

## 2019-07-17 DIAGNOSIS — J3489 Other specified disorders of nose and nasal sinuses: Secondary | ICD-10-CM

## 2019-07-17 DIAGNOSIS — Z7982 Long term (current) use of aspirin: Secondary | ICD-10-CM | POA: Diagnosis not present

## 2019-07-17 DIAGNOSIS — I2510 Atherosclerotic heart disease of native coronary artery without angina pectoris: Secondary | ICD-10-CM | POA: Insufficient documentation

## 2019-07-17 DIAGNOSIS — J441 Chronic obstructive pulmonary disease with (acute) exacerbation: Secondary | ICD-10-CM | POA: Insufficient documentation

## 2019-07-17 DIAGNOSIS — K219 Gastro-esophageal reflux disease without esophagitis: Secondary | ICD-10-CM | POA: Diagnosis not present

## 2019-07-17 DIAGNOSIS — E7800 Pure hypercholesterolemia, unspecified: Secondary | ICD-10-CM | POA: Insufficient documentation

## 2019-07-17 DIAGNOSIS — R0981 Nasal congestion: Secondary | ICD-10-CM | POA: Diagnosis not present

## 2019-07-17 MED ORDER — DOXYCYCLINE HYCLATE 100 MG PO CAPS: 100.0000 mg | ORAL_CAPSULE | Freq: Two times a day (BID) | ORAL | 0 refills | Status: DC

## 2019-07-17 MED ORDER — FLUTICASONE PROPIONATE 50 MCG/ACT NA SUSP: 2.0000 | Freq: Every day | NASAL | 0 refills | Status: DC

## 2019-07-17 NOTE — ED Triage Notes (Signed)
Patient c/o sinus pain and pressure, and slight cough that started Thursday afternoon. Patient reports low grade fever.

## 2019-07-17 NOTE — Discharge Instructions (Signed)
Use of Flonase daily.  Your Covid test should be available in 12 to 24 hours.  If your test is negative you may get your second Covid 19 vaccination shot tomorrow.  If it is positive you will have to reschedule for later time.  Recommend following up with your primary care physician.

## 2019-07-17 NOTE — ED Provider Notes (Signed)
[facility]    CSN: [id number] Arrival date & time: 07/17/19  1102      History   Chief Complaint Chief Complaint  Patient presents with  . Sinus Problem    HPI Keith Cruz is a 79 y.o. male.   HPI  79 year old male  history of COPD and chronic bronchitis hypertension stroke CAD TIA kidney disease carotid stenosis presents with sinus pressure and pain and a slight cough that started on Wednesday 4 days prior to this visit.  He states that he has had milder symptoms prior to that.  Is also had a low-grade fever of 99.5 but is normal today.  He had his first Covid shot of [place] approximately 2 weeks ago and is scheduled for his second shot tomorrow afternoon.  He states whenever he coughs now his head feels like it is going to explode because of the pressure.  He has had nasal discharge earlier in his illness but not as much presently.  Cough appears very rattly.  O2 sats on room air 95% but in review of his previous medical records 3 months ago it was the same level at that time.  Uses 2 inhalers at home when rescue and 1 on a daily basis.        Past Medical History:  Diagnosis Date  . Arthritis    back  . Asthma   . Atherosclerosis of aorta ([facility] [place])   . Atherosclerosis of aorta ([place])   . Bronchitis 05/2017   chronic  . CAD (coronary artery disease)   . Carotid artery disease ([place])   . Carotid stenosis   . Carotid stenosis, bilateral   . Chronic bronchitis ([place])   . Chronic cough   . COPD (chronic obstructive pulmonary disease) ([place])   . Dysrhythmia   . GERD (gastroesophageal reflux disease)   . Heart murmur   . HOH (hard of hearing)    hearing aids  . Hypercholesteremia   . Hypertension   . Kidney disease    stent  . Macular degeneration of left eye   . Myocardial infarction ([place]) 2016  . Pre-diabetes   . Seasonal allergies   . Seizure ([place])   . Seizures ([facility])    viral meningitis  . Stroke [facility])    1991 and imaging 05/12/15 old strokes  . TIA  (transient ischemic attack) 1991   x2  . Uses hearing aid   . Viral meningitis 2017  . Viral meningitis   . Wears dentures    upper and lower    Patient Active Problem List   Diagnosis Date Noted  . Asbestos exposure 07/02/2018  . History of stroke 05/13/2018  . Former smoker 05/13/2018  . Abnormal findings on diagnostic imaging of lung 05/13/2018  . Dizziness 05/11/2018  . Elevated MCV 01/06/2017  . Pre-diabetes 07/01/2016  . Renal artery stenosis ([place]) 06/24/2016  . Chronic bronchitis ([place]) 12/10/2015  . Asthma, mild persistent 11/19/2015  . ED (erectile dysfunction) 08/23/2015  . GERD (gastroesophageal reflux disease) 01/22/2015  . Insomnia 01/22/2015  . Benign essential HTN 12/28/2014  . Carotid artery narrowing 07/13/2014  . Arteriosclerosis of coronary artery 07/13/2014  . Chronic kidney disease (CKD), stage III (moderate) 07/13/2014  . Hardening of the aorta (main artery of the heart) ([place]) 07/13/2014  . Bilateral carotid artery stenosis 07/13/2014  . Hyperlipidemia 03/02/2014  . Atrial fibrillation ([place]) 10/10/2013  . S/P CABG x 5 10/07/2013    Past Surgical History:  Procedure Laterality Date  .  CAROTID ENDARTERECTOMY Left 2014  . CAROTID ENDARTERECTOMY Right 1991  . CATARACT EXTRACTION W/PHACO Left 06/24/2017   Procedure: CATARACT EXTRACTION PHACO AND INTRAOCULAR LENS PLACEMENT ([place]) LEFT BORDERLINE DIABETIC;  Surgeon: Leandrew Koyanagi, MD;  Location: [facility];  Service: Ophthalmology;  Laterality: Left;  . CATARACT EXTRACTION W/PHACO Right 07/22/2017   Procedure: CATARACT EXTRACTION PHACO AND INTRAOCULAR LENS PLACEMENT ([place]) RIGHT BORDERLINE DIABETIC;  Surgeon: Leandrew Koyanagi, MD;  Location: [facility];  Service: Ophthalmology;  Laterality: Right;  . CORONARY ARTERY BYPASS GRAFT     x5  . SKIN LESION EXCISION Left    left ear  . URETERAL STENT PLACEMENT         Home Medications    Prior to Admission medications     Medication Sig Start Date End Date Taking? Authorizing Provider  aspirin EC 81 MG tablet Take 81 mg by mouth daily.   Yes [provider]  Cholecalciferol (VITAMIN D3) 2000 UNITS capsule Take 2,000 Units by mouth daily. am   Yes [provider]  clopidogrel (PLAVIX) 75 MG tablet Take 1 tablet (75 mg total) by mouth daily. 08/19/18  Yes McLean-Scocuzza, Nino Glow, MD  fluticasone (FLOVENT HFA) 110 MCG/ACT inhaler Inhale 2 puffs into the lungs 2 (two) times daily. Rinse your mouth 08/19/18  Yes McLean-Scocuzza, Nino Glow, MD  folic acid (FOLVITE) 1 MG tablet Take 0.8 mg by mouth daily. am   Yes [provider]  hydrALAZINE (APRESOLINE) 25 MG tablet Take 1 tablet (25 mg total) by mouth 3 (three) times daily. 07/14/19  Yes McLean-Scocuzza, Nino Glow, MD  Multiple Vitamins-Minerals (CENTRUM SILVER 50+MEN PO) Take by mouth.   Yes [provider]  pantoprazole (PROTONIX) 40 MG tablet TAKE 1 TABLET (40 MG TOTAL) BY MOUTH 2 (TWO) TIMES DAILY. AM AND DINNER 01/23/19  Yes Karamalegos, Devonne Doughty, DO  simvastatin (ZOCOR) 40 MG tablet Take 1 tablet (40 mg total) by mouth daily. am 08/19/18  Yes McLean-Scocuzza, Nino Glow, MD  ACCU-CHEK AVIVA PLUS test strip CHECK BLOOD SUGAR 1 X DAILY 02/28/19   Karamalegos, Devonne Doughty, DO  albuterol (VENTOLIN HFA) 108 (90 Base) MCG/ACT inhaler Inhale 1-2 puffs into the lungs every 4 (four) hours as needed for wheezing or shortness of breath. 08/19/18   McLean-Scocuzza, Nino Glow, MD  cyclobenzaprine (FLEXERIL) 5 MG tablet Take 1-2 tablets (5-10 mg total) by mouth at bedtime as needed for muscle spasms. 04/05/19   McLean-Scocuzza, Nino Glow, MD  doxycycline (VIBRAMYCIN) 100 MG capsule Take 1 capsule (100 mg total) by mouth 2 (two) times daily. 07/17/19   Lorin Picket, PA-C  fexofenadine (ALLEGRA) 180 MG tablet Take 1 tablet (180 mg total) by mouth daily as needed for allergies or rhinitis. 08/19/18   McLean-Scocuzza, Nino Glow, MD  fluticasone (FLONASE) 50  MCG/ACT nasal spray Place 2 sprays into both nostrils daily. 07/17/19   Lorin Picket, PA-C  metoprolol tartrate (LOPRESSOR) 50 MG tablet Take 1 tablet (50 mg total) by mouth 2 (two) times daily. 08/19/18   McLean-Scocuzza, Nino Glow, MD    Family History Family History  Problem Relation Age of Onset  . Stroke Mother   . Heart attack Mother   . Stroke Father   . Stroke Paternal Grandmother   . Stroke Paternal Grandfather   . Alzheimer's disease Paternal Grandfather   . Stroke Sister   . Alzheimer's disease Sister   . Stroke Brother   . Alzheimer's disease Maternal Grandmother     Social History Social History  Tobacco Use  . Smoking status: Former Smoker    Packs/day: 2.00    Years: 10.00    Pack years: 20.00    Types: Cigarettes    Quit date: 04/28/1970    Years since quitting: 49.2  . Smokeless tobacco: Former Network engineer Use Topics  . Alcohol use: No    Alcohol/week: 0.0 standard drinks    Comment: previous  . Drug use: No     Allergies   Fexofenadine-pseudoephed er, Nifedipine, Allegra  [fexofenadine], Anoro ellipta [umeclidinium-vilanterol], and Prednisone   Review of Systems Review of Systems  Constitutional: Positive for activity change and fever. Negative for appetite change, chills, diaphoresis and fatigue.  HENT: Positive for congestion, sinus pressure and sinus pain. Negative for sore throat.   Respiratory: Positive for cough, shortness of breath and wheezing.   All other systems reviewed and are negative.    Physical Exam Triage Vital Signs ED Triage Vitals  Enc Vitals Group     BP 07/17/19 1124 (!) 179/77     Pulse Rate 07/17/19 1124 70     Resp 07/17/19 1124 16     Temp 07/17/19 1124 98.4 F (36.9 C)     Temp Source 07/17/19 1124 Oral     SpO2 07/17/19 1124 95 %     Weight 07/17/19 1120 221 lb (100.2 kg)     Height 07/17/19 1120 6\' 4"  (1.93 m)     Head Circumference --      Peak Flow --      Pain Score 07/17/19 1120 0     Pain Loc  --      Pain Edu? --      Excl. in [place]? --    No data found.  Updated Vital Signs BP (!) 179/77 (BP Location: Left Arm)   Pulse 70   Temp 98.4 F (36.9 C) (Oral)   Resp 16   Ht 6\' 4"  (1.93 m)   Wt 221 lb (100.2 kg)   SpO2 95%   BMI 26.90 kg/m   Visual Acuity Right Eye Distance:   Left Eye Distance:   Bilateral Distance:    Right Eye Near:   Left Eye Near:    Bilateral Near:     Physical Exam Vitals and nursing note reviewed.  Constitutional:      General: He is not in acute distress.    Appearance: Normal appearance. He is not ill-appearing or toxic-appearing.  HENT:     Head: Normocephalic and atraumatic.     Right Ear: Tympanic membrane, ear canal and external ear normal.     Left Ear: Tympanic membrane, ear canal and external ear normal.     Nose: Congestion present.     Comments: Patient has tenderness to percussion over the maxillary sinuses greater than the frontal.    Mouth/Throat:     Mouth: Mucous membranes are moist.     Pharynx: Oropharynx is clear.  Eyes:     Conjunctiva/sclera: Conjunctivae normal.  Cardiovascular:     Rate and Rhythm: Normal rate and regular rhythm.     Heart sounds: Normal heart sounds.  Pulmonary:     Breath sounds: Wheezing and rhonchi present.     Comments: Abnormal breath sounds improved with cough. Musculoskeletal:        General: Normal range of motion.     Cervical back: Normal range of motion and neck supple.  Skin:    General: Skin is warm and dry.  Neurological:     General:  No focal deficit present.     Mental Status: He is alert and oriented to person, place, and time.  Psychiatric:        Mood and Affect: Mood normal.        Behavior: Behavior normal.        Thought Content: Thought content normal.        Judgment: Judgment normal.      UC Treatments / Results  Labs (all labs ordered are listed, but only abnormal results are displayed) Labs Reviewed  SARS CORONAVIRUS 2 (TAT 6-24 HRS)     EKG   Radiology No results found.  Procedures Procedures (including critical care time)  Medications Ordered in UC Medications - No data to display  Initial Impression / Assessment and Plan / UC Course  I have reviewed the triage vital signs and the nursing notes.  Pertinent labs & imaging results that were available during my care of the patient were reviewed by me and considered in my medical decision making (see chart for details).   79 year old male presents today with sinus pressure and pain and cough has had for at least for 5 days but probably predates that several other days but just less bothersome.  He has a history of COPD.  He received his first Covid vaccine injection approximately 2 weeks ago and is scheduled for his second tomorrow.  He states whenever he coughs his head feels like it is going to explode.  Currently he is taking to inhalers at home.   It is likely that the patient has an exacerbation of COPD but also may have acute maxillary sinusitis.  I will treat both with doxycycline for 7 days and will also prescribed Flonase nasal spray.  He does have an allergy to oral prednisone but do not consider the Flonase to be a significant dose to cause him problems.  Also tested for COVID-19 today in our facility.  That should be available in 12 to 24 hours.  If he is negative he may receive his Covid injection.  If it is not he will have to delay it until he is cleared by his primary care physician.  He should follow-up with his primary care physician particularly if he is not improving.   Final Clinical Impressions(s) / UC Diagnoses   Final diagnoses:  Acute maxillary sinusitis, recurrence not specified  COPD exacerbation [facility])     Discharge Instructions     Use of Flonase daily.  Your Covid test should be available in 12 to 24 hours.  If your test is negative you may get your second Covid 19 vaccination shot tomorrow.  If it is positive you will have to  reschedule for later time.  Recommend following up with your primary care physician.    ED Prescriptions    Medication Sig Dispense Auth. Provider   doxycycline (VIBRAMYCIN) 100 MG capsule Take 1 capsule (100 mg total) by mouth 2 (two) times daily. 14 capsule Crecencio Mc P, PA-C   fluticasone (FLONASE) 50 MCG/ACT nasal spray Place 2 sprays into both nostrils daily. 16 g Lorin Picket, PA-C     PDMP not reviewed this encounter.   Lorin Picket, PA-C 07/17/19 1230

## 2019-07-18 LAB — SARS CORONAVIRUS 2 (TAT 6-24 HRS): SARS Coronavirus 2: NEGATIVE

## 2019-07-26 DIAGNOSIS — H35342 Macular cyst, hole, or pseudohole, left eye: Secondary | ICD-10-CM | POA: Diagnosis not present

## 2019-07-27 ENCOUNTER — Other Ambulatory Visit: Payer: Self-pay | Admitting: Family Medicine

## 2019-07-27 DIAGNOSIS — K219 Gastro-esophageal reflux disease without esophagitis: Secondary | ICD-10-CM

## 2019-08-09 DIAGNOSIS — D1801 Hemangioma of skin and subcutaneous tissue: Secondary | ICD-10-CM | POA: Diagnosis not present

## 2019-08-09 DIAGNOSIS — L821 Other seborrheic keratosis: Secondary | ICD-10-CM | POA: Diagnosis not present

## 2019-08-09 DIAGNOSIS — L814 Other melanin hyperpigmentation: Secondary | ICD-10-CM | POA: Diagnosis not present

## 2019-08-09 DIAGNOSIS — L570 Actinic keratosis: Secondary | ICD-10-CM | POA: Diagnosis not present

## 2019-08-22 DIAGNOSIS — E782 Mixed hyperlipidemia: Secondary | ICD-10-CM | POA: Diagnosis not present

## 2019-08-22 DIAGNOSIS — I6523 Occlusion and stenosis of bilateral carotid arteries: Secondary | ICD-10-CM | POA: Diagnosis not present

## 2019-08-22 DIAGNOSIS — I25118 Atherosclerotic heart disease of native coronary artery with other forms of angina pectoris: Secondary | ICD-10-CM | POA: Diagnosis not present

## 2019-08-22 DIAGNOSIS — I10 Essential (primary) hypertension: Secondary | ICD-10-CM | POA: Diagnosis not present

## 2019-08-22 DIAGNOSIS — J439 Emphysema, unspecified: Secondary | ICD-10-CM | POA: Diagnosis not present

## 2019-08-30 ENCOUNTER — Other Ambulatory Visit: Payer: Self-pay | Admitting: Internal Medicine

## 2019-08-30 DIAGNOSIS — I10 Essential (primary) hypertension: Secondary | ICD-10-CM

## 2019-08-30 MED ORDER — METOPROLOL TARTRATE 50 MG PO TABS: 50.0000 mg | ORAL_TABLET | Freq: Two times a day (BID) | ORAL | 3 refills | Status: DC

## 2019-09-02 ENCOUNTER — Encounter: Payer: Self-pay | Admitting: Internal Medicine

## 2019-09-02 ENCOUNTER — Telehealth: Payer: Self-pay | Admitting: Internal Medicine

## 2019-09-02 ENCOUNTER — Other Ambulatory Visit: Payer: Self-pay

## 2019-09-02 ENCOUNTER — Ambulatory Visit (INDEPENDENT_AMBULATORY_CARE_PROVIDER_SITE_OTHER): Payer: Medicare Other | Admitting: Internal Medicine

## 2019-09-02 VITALS — BP 132/66 | HR 65 | Temp 97.8°F | Ht 76.0 in | Wt 221.4 lb

## 2019-09-02 DIAGNOSIS — R5383 Other fatigue: Secondary | ICD-10-CM

## 2019-09-02 DIAGNOSIS — I6523 Occlusion and stenosis of bilateral carotid arteries: Secondary | ICD-10-CM

## 2019-09-02 DIAGNOSIS — I150 Renovascular hypertension: Secondary | ICD-10-CM | POA: Insufficient documentation

## 2019-09-02 DIAGNOSIS — I25118 Atherosclerotic heart disease of native coronary artery with other forms of angina pectoris: Secondary | ICD-10-CM

## 2019-09-02 DIAGNOSIS — N1831 Chronic kidney disease, stage 3a: Secondary | ICD-10-CM

## 2019-09-02 DIAGNOSIS — Z8673 Personal history of transient ischemic attack (TIA), and cerebral infarction without residual deficits: Secondary | ICD-10-CM | POA: Diagnosis not present

## 2019-09-02 DIAGNOSIS — I6529 Occlusion and stenosis of unspecified carotid artery: Secondary | ICD-10-CM

## 2019-09-02 DIAGNOSIS — R200 Anesthesia of skin: Secondary | ICD-10-CM

## 2019-09-02 DIAGNOSIS — M25512 Pain in left shoulder: Secondary | ICD-10-CM

## 2019-09-02 DIAGNOSIS — I701 Atherosclerosis of renal artery: Secondary | ICD-10-CM

## 2019-09-02 DIAGNOSIS — H6692 Otitis media, unspecified, left ear: Secondary | ICD-10-CM | POA: Diagnosis not present

## 2019-09-02 DIAGNOSIS — H532 Diplopia: Secondary | ICD-10-CM

## 2019-09-02 DIAGNOSIS — Z951 Presence of aortocoronary bypass graft: Secondary | ICD-10-CM | POA: Diagnosis not present

## 2019-09-02 DIAGNOSIS — L570 Actinic keratosis: Secondary | ICD-10-CM

## 2019-09-02 DIAGNOSIS — M25511 Pain in right shoulder: Secondary | ICD-10-CM

## 2019-09-02 MED ORDER — AMOXICILLIN-POT CLAVULANATE 875-125 MG PO TABS: 1.0000 | ORAL_TABLET | Freq: Two times a day (BID) | ORAL | 0 refills | Status: DC

## 2019-09-02 MED ORDER — CLOPIDOGREL BISULFATE 75 MG PO TABS: 75.0000 mg | ORAL_TABLET | Freq: Every day | ORAL | 3 refills | Status: DC

## 2019-09-02 NOTE — Telephone Encounter (Signed)
Call pt and mention  1. abx for left ear pain sent to pharmacy 09/02/19  2. If keeps having double vision when looking to his right I would consider MRI of his brain let me know if this is wanted now of if he will wait

## 2019-09-02 NOTE — Patient Instructions (Addendum)
Normal heart rate/pulse 60-100 beats per minute   Xray right shoulder ordered if pain returns call the clinic for an ortho   Blood sugar in am 90 to <130   Mucinex DM green label   Call and schedule dermatology appointment again for face, neck, arms/hands    [facility]  [place], [place] [address]  [phone]  [address]  [place], [place] [address]  [phone]  [phone] (Fax)  DOE (dyspnea on exertion) (Primary Dx);  Pulmonary emphysema, unspecified emphysema type (CMS-HCC);  Chronic cough;  Mediastinal lymphadenopathy    08/09/2019 Office Visit [facility]  [place], [place] [address]  [phone]  Jamelle Rushing, MD  [address]  [address]  [place], [place] [address]  [phone]  [phone] (Fax)  Actinic keratosis (Primary Dx);  Cherry angioma;  Seborrheic keratoses;  Lentigines   Social History - documented as of this encounter    Actinic Keratosis An actinic keratosis is a precancerous growth on the skin. If there is more than one growth, the condition is called actinic keratoses. Actinic keratoses appear most often on areas of skin that get a lot of sun exposure, including the scalp, face, ears, lips, upper back, forearms, and the backs of the hands. If left untreated, these growths may develop into a skin cancer called squamous cell carcinoma. It is important to have all these growths checked by a health care provider to determine the best treatment approach. What are the causes? Actinic keratoses are caused by getting too much ultraviolet (UV) radiation from the sun or other UV light sources. What increases the risk? You are more likely to develop this condition if you:  Have light-colored skin and blue eyes.  Have blond or red hair.  Spend a lot of time in the sun.  Do not protect your skin from the sun  when outdoors.  Are an older person. The risk of developing an actinic keratosis increases with age. What are the signs or symptoms? Actinic keratoses feel like scaly, rough spots of skin. Symptoms of this condition include growths that may:  Be as small as a pinhead or as big as a quarter.  Itch, hurt, or feel sensitive.  Be skin-colored, light tan, dark tan, pink, or a combination of any of these colors. In most cases, the growths become red.  Have a small piece of pink or gray skin (skin tag) growing from them. It may be easier to notice actinic keratoses by feeling them, rather than seeing them. Sometimes, actinic keratoses disappear, but many reappear a few days to a few weeks later. How is this diagnosed? This condition is usually diagnosed with a physical exam.  A tissue sample may be removed from the actinic keratosis and examined under a microscope (biopsy). How is this treated? If needed, this condition may be treated by:  Scraping off the actinic keratosis (curettage).  Freezing the actinic keratosis with liquid nitrogen (cryosurgery). This causes the growth to eventually fall off the skin.  Applying medicated creams or gels to destroy the cells in the growth.  Applying chemicals to the actinic keratosis to make the outer layers of skin peel off (chemical peel).  Using photodynamic therapy. In this procedure, medicated cream is applied to the actinic keratosis. This cream increases your skin's sensitivity to light. Then, a strong light is aimed at the actinic keratosis to destroy cells in the  growth. Follow these instructions at home: Skin care  Apply cool, wet cloths (cool compresses) to the affected areas.  Do not scratch your skin.  Check your skin regularly for any growths, especially growths that: ? Start to itch or bleed. ? Change in size, shape, or color. Caring for the treated area  Keep the treated area clean and dry as told by your health care  provider.  Do not apply any medicine, cream, or lotion to the treated area unless your health care provider tells you to do that.  Do not pick at blisters or try to break them open. This can cause infection and scarring.  If you have red or irritated skin after treatment, follow instructions from your health care provider about how to take care of the treated area. Make sure you: ? Wash your hands with soap and water before you change your bandage (dressing). If soap and water are not available, use hand sanitizer. ? Change your dressing as told by your health care provider.  If you have red or irritated skin after treatment, check your treated area every day for signs of infection. Check for: ? Redness, swelling, or pain. ? Fluid or blood. ? Warmth. ? Pus or a bad smell. General instructions  Take or apply over-the-counter and prescription medicines only as told by your health care provider.  Return to your normal activities as told by your health care provider. Ask your health care provider what activities are safe for you.  Have a skin exam done every year by a health care provider who is a skin specialist (dermatologist).  Keep all follow-up visits as told by your health care provider. This is important. Lifestyle  Do not use any products that contain nicotine or tobacco, such as cigarettes and e-cigarettes. If you need help quitting, ask your health care provider.  Take steps to protect your skin from the sun. ? Try to avoid the sun between 10:00 a.m. and 4:00 p.m. This is when the UV light is the strongest. ? Use a sunscreen or sunblock with SPF 30 (sun protection factor 30) or greater. ? Apply sunscreen before you are exposed to sunlight and reapply as often as directed by the instructions on the sunscreen container. ? Always wear sunglasses that have UV protection, and always wear a hat and clothing to protect your skin from sunlight. ? When possible, avoid medicines that  increase your sensitivity to sunlight. ? Do not use tanning beds or other indoor tanning devices. Contact a health care provider if:  You notice any changes or new growths on your skin.  You have swelling, pain, or more redness around your treated area.  You have fluid or blood coming from your treated area.  Your treated area feels warm to the touch.  You have pus or a bad smell coming from your treated area.  You have a fever.  You have a blister that becomes large and painful. Summary  An actinic keratosis is a precancerous growth on the skin. If there is more than one growth, the condition is called actinic keratoses. In some cases, if left untreated, these growths can develop into skin cancer.  Check your skin regularly for any growths, especially growths that start to itch or bleed, or change in size, shape, or color.  Take steps to protect your skin from the sun.  Contact a health care provider if you notice any changes or new growths on your skin.  Keep all follow-up visits as  told by your health care provider. This is important. This information is not intended to replace advice given to you by your health care provider. Make sure you discuss any questions you have with your health care provider. Document Revised: 08/25/2017 Document Reviewed: 08/25/2017 Elsevier Patient Education  [employer].  Chronic Obstructive Pulmonary Disease  Chronic obstructive pulmonary disease (COPD) is a long-term (chronic) condition that affects the lungs. COPD is a general term that can be used to describe many different lung problems that cause lung swelling (inflammation) and limit airflow, including chronic bronchitis and emphysema. If you have COPD, your lung function will probably never return to normal. In most cases, it gets worse over time. However, there are steps you can take to slow the progression of the disease and improve your quality of life. What are the causes? This  condition may be caused by:  Smoking. This is the most common cause.  Certain genes passed down through families. What increases the risk? The following factors may make you more likely to develop this condition:  Secondhand smoke from cigarettes, pipes, or cigars.  Exposure to chemicals and other irritants such as fumes and dust in the work environment.  Chronic lung conditions or infections. What are the signs or symptoms? Symptoms of this condition include:  Shortness of breath, especially during physical activity.  Chronic cough with a large amount of thick mucus. Sometimes the cough may not have any mucus (dry cough).  Wheezing.  Rapid breaths.  Gray or bluish discoloration (cyanosis) of the skin, especially in your fingers, toes, or lips.  Feeling tired (fatigue).  Weight loss.  Chest tightness.  Frequent infections.  Episodes when breathing symptoms become much worse (exacerbations).  Swelling in the ankles, feet, or legs. This may occur in later stages of the disease. How is this diagnosed? This condition is diagnosed based on:  Your medical history.  A physical exam. You may also have tests, including:  Lung (pulmonary) function tests. This may include a spirometry test, which measures your ability to exhale properly.  Chest X-ray.  CT scan.  Blood tests. How is this treated? This condition may be treated with:  Medicines. These may include inhaled rescue medicines to treat acute exacerbations as well as long-term, or maintenance, medicines to prevent flare-ups of COPD. ? Bronchodilators help treat COPD by dilating the airways to allow increased airflow and make your breathing more comfortable. ? Steroids can reduce airway inflammation and help prevent exacerbations.  Smoking cessation. If you smoke, your health care provider may ask you to quit, and may also recommend therapy or replacement products to help you quit.  Pulmonary rehabilitation.  This may involve working with a team of health care providers and specialists, such as respiratory, occupational, and physical therapists.  Exercise and physical activity. These are beneficial for nearly all people with COPD.  Nutrition therapy to gain weight, if you are underweight.  Oxygen. Supplemental oxygen therapy is only helpful if you have a low oxygen level in your blood (hypoxemia).  Lung surgery or transplant.  Palliative care. This is to help people with COPD feel comfortable when treatment is no longer working. Follow these instructions at home: Medicines  Take over-the-counter and prescription medicines (inhaled or pills) only as told by your health care provider.  Talk to your health care provider before taking any cough or allergy medicines. You may need to avoid certain medicines that dry out your airways. Lifestyle  If you are a smoker, the most  important thing that you can do is to stop smoking. Do not use any products that contain nicotine or tobacco, such as cigarettes and e-cigarettes. If you need help quitting, ask your health care provider. Continuing to smoke will cause the disease to progress faster.  Avoid exposure to things that irritate your lungs, such as smoke, chemicals, and fumes.  Stay active, but balance activity with periods of rest. Exercise and physical activity will help you maintain your ability to do things you want to do.  Learn and use relaxation techniques to manage stress and to control your breathing.  Get the right amount of sleep and get quality sleep. Most adults need 7 or more hours per night.  Eat healthy foods. Eating smaller, more frequent meals and resting before meals may help you maintain your strength. Controlled breathing Learn and use controlled breathing techniques as directed by your health care provider. Controlled breathing techniques include:  Pursed lip breathing. Start by breathing in (inhaling) through your nose for 1  second. Then, purse your lips as if you were going to whistle and breathe out (exhale) through the pursed lips for 2 seconds.  Diaphragmatic breathing. Start by putting one hand on your abdomen just above your waist. Inhale slowly through your nose. The hand on your abdomen should move out. Then purse your lips and exhale slowly. You should be able to feel the hand on your abdomen moving in as you exhale. Controlled coughing Learn and use controlled coughing to clear mucus from your lungs. Controlled coughing is a series of short, progressive coughs. The steps of controlled coughing are: 1. Lean your head slightly forward. 2. Breathe in deeply using diaphragmatic breathing. 3. Try to hold your breath for 3 seconds. 4. Keep your mouth slightly open while coughing twice. 5. Spit any mucus out into a tissue. 6. Rest and repeat the steps once or twice as needed. General instructions  Make sure you receive all the vaccines that your health care provider recommends, especially the pneumococcal and influenza vaccines. Preventing infection and hospitalization is very important when you have COPD.  Use oxygen therapy and pulmonary rehabilitation if directed to by your health care provider. If you require home oxygen therapy, ask your health care provider whether you should purchase a pulse oximeter to measure your oxygen level at home.  Work with your health care provider to develop a COPD action plan. This will help you know what steps to take if your condition gets worse.  Keep other chronic health conditions under control as told by your health care provider.  Avoid extreme temperature and humidity changes.  Avoid contact with people who have an illness that spreads from person to person (is contagious), such as viral infections or pneumonia.  Keep all follow-up visits as told by your health care provider. This is important. Contact a health care provider if:  You are coughing up more mucus  than usual.  There is a change in the color or thickness of your mucus.  Your breathing is more labored than usual.  Your breathing is faster than usual.  You have difficulty sleeping.  You need to use your rescue medicines or inhalers more often than expected.  You have trouble doing routine activities such as getting dressed or walking around the house. Get help right away if:  You have shortness of breath while you are resting.  You have shortness of breath that prevents you from: ? Being able to talk. ? Performing your usual physical  activities.  You have chest pain lasting longer than 5 minutes.  Your skin color is more blue (cyanotic) than usual.  You measure low oxygen saturations for longer than 5 minutes with a pulse oximeter.  You have a fever.  You feel too tired to breathe normally. Summary  Chronic obstructive pulmonary disease (COPD) is a long-term (chronic) condition that affects the lungs.  Your lung function will probably never return to normal. In most cases, it gets worse over time. However, there are steps you can take to slow the progression of the disease and improve your quality of life.  Treatment for COPD may include taking medicines, quitting smoking, pulmonary rehabilitation, and changes to diet and exercise. As the disease progresses, you may need oxygen therapy, a lung transplant, or palliative care.  To help manage your condition, do not smoke, avoid exposure to things that irritate your lungs, stay up to date on all vaccines, and follow your health care provider's instructions for taking medicines. This information is not intended to replace advice given to you by your health care provider. Make sure you discuss any questions you have with your health care provider. Document Revised: 03/27/2017 Document Reviewed: 05/19/2016 Elsevier Patient Education  2020 [employer].

## 2019-09-02 NOTE — Progress Notes (Signed)
Chief Complaint  Patient presents with  . Follow-up   F/u with wife  1. HTn with CKD 3a (f/u renal) improved on hydralazine 25 tid and lopressor 50 mg bid w/o side effects HR <100 and in the 60s most time and sbp 130s at home since increasing dose of hydralazine at times hard to take lunch dose  2. C/o left ear pain x 1-2 weeks he wears hearing aid in right ear nothing tried  3. CAS complete occlusion right per 07/01/19 carotid US and 40-59 % left s/p CEA b/l in the past on aspirin and plavix qd will see Dr. Lucky Cowboy 12/2019 for repeat carotid US and decision of tx of cas left side  4. Fatigue at times CAD with stable angina s/p CABG x 5 Dr. Raliegh Ip cards pending EKG, stress test nuclear, and echo 09/28/19  5. Right shoulder pain mentioned visit 03/2019 resolved for now if returns Xray ordered for future to be done in clinic likely arthritis disc with pt and wife today  6. C/o double vision if looking to his right he has seen the eye MD w/in the last couple of months h/o macular degeneration but not candidate for injections  Reviewed CT head 05/11/2018  IMPRESSION: 1. Mild atrophy and probable mild small vessel ischemic change in the periventricular white matter. No acute intracranial abnormality. 2. Left maxillary sinusitis with evidence of ethmoid sinus disease as well.  Review of Systems  Constitutional: Negative for weight loss.  HENT: Positive for hearing loss.   Eyes: Positive for double vision. Negative for blurred vision.  Respiratory: Negative for shortness of breath.   Cardiovascular: Negative for chest pain.  Gastrointestinal: Negative for abdominal pain.  Musculoskeletal: Negative for falls and joint pain.  Skin: Negative for rash.  Neurological: Negative for headaches.  Psychiatric/Behavioral: Negative for memory loss.   Past Medical History:  Diagnosis Date  . Arthritis    back  . Asthma   . Atherosclerosis of aorta ([place])   . Atherosclerosis of aorta ([place])   . Bronchitis 05/2017    chronic  . CAD (coronary artery disease)   . Carotid artery disease ([place])   . Carotid stenosis   . Carotid stenosis, bilateral   . Chronic bronchitis ([place])   . Chronic cough   . COPD (chronic obstructive pulmonary disease) ([place])   . Dysrhythmia   . GERD (gastroesophageal reflux disease)   . Heart murmur   . HOH (hard of hearing)    hearing aids  . Hypercholesteremia   . Hypertension   . Kidney disease    stent  . Macular degeneration of left eye   . Myocardial infarction ([place]) 2016  . Pre-diabetes   . Seasonal allergies   . Seizure ([place])   . Seizures ([facility])    viral meningitis  . Stroke [facility])    1991 and imaging 05/12/15 old strokes  . TIA (transient ischemic attack) 1991   x2  . Uses hearing aid   . Viral meningitis 2017  . Viral meningitis   . Wears dentures    upper and lower   Past Surgical History:  Procedure Laterality Date  . CAROTID ENDARTERECTOMY Left 2014  . CAROTID ENDARTERECTOMY Right 1991  . CATARACT EXTRACTION W/PHACO Left 06/24/2017   Procedure: CATARACT EXTRACTION PHACO AND INTRAOCULAR LENS PLACEMENT ([facility]) LEFT BORDERLINE DIABETIC;  Surgeon: Leandrew Koyanagi, MD;  Location: [place];  Service: Ophthalmology;  Laterality: Left;  . CATARACT EXTRACTION W/PHACO Right 07/22/2017   Procedure: CATARACT EXTRACTION PHACO AND  INTRAOCULAR LENS PLACEMENT ([facility]) RIGHT BORDERLINE DIABETIC;  Surgeon: Leandrew Koyanagi, MD;  Location: [place];  Service: Ophthalmology;  Laterality: Right;  . CORONARY ARTERY BYPASS GRAFT     x5  . SKIN LESION EXCISION Left    left ear  . URETERAL STENT PLACEMENT     Family History  Problem Relation Age of Onset  . Stroke Mother   . Heart attack Mother   . Stroke Father   . Stroke Paternal Grandmother   . Stroke Paternal Grandfather   . Alzheimer's disease Paternal Grandfather   . Stroke Sister   . Alzheimer's disease Sister   . Stroke Brother   . Alzheimer's disease Maternal Grandmother    Social  History   Socioeconomic History  . Marital status: Married    Spouse name: Not on file  . Number of children: Not on file  . Years of education: Not on file  . Highest education level: 10th grade  Occupational History  . Not on file  Tobacco Use  . Smoking status: Former Smoker    Packs/day: 2.00    Years: 10.00    Pack years: 20.00    Types: Cigarettes    Quit date: 04/28/1970    Years since quitting: 49.3  . Smokeless tobacco: Former Network engineer and Sexual Activity  . Alcohol use: No    Alcohol/week: 0.0 standard drinks    Comment: previous  . Drug use: No  . Sexual activity: Yes    Comment: wife   Other Topics Concern  . Not on file  Social History Narrative   Re-married x 3 years    Retired    Owns guns, wears seat belt, safe in relationship    2 sons and 1 daughter    Social Determinants of Radio broadcast assistant Strain:   . Difficulty of Paying Living Expenses:   Food Insecurity:   . Worried About Charity fundraiser in the Last Year:   . Arboriculturist in the Last Year:   Transportation Needs:   . Film/video editor (Medical):   Marland Kitchen Lack of Transportation (Non-Medical):   Physical Activity:   . Days of Exercise per Week:   . Minutes of Exercise per Session:   Stress:   . Feeling of Stress :   Social Connections:   . Frequency of Communication with Friends and Family:   . Frequency of Social Gatherings with Friends and Family:   . Attends Religious Services:   . Active Member of Clubs or Organizations:   . Attends Archivist Meetings:   Marland Kitchen Marital Status:   Intimate Partner Violence:   . Fear of Current or Ex-Partner:   . Emotionally Abused:   Marland Kitchen Physically Abused:   . Sexually Abused:    Current Meds  Medication Sig  . ACCU-CHEK AVIVA PLUS test strip CHECK BLOOD SUGAR 1 X DAILY  . albuterol (VENTOLIN HFA) 108 (90 Base) MCG/ACT inhaler Inhale 1-2 puffs into the lungs every 4 (four) hours as needed for wheezing or shortness of  breath.  Marland Kitchen aspirin EC 81 MG tablet Take 81 mg by mouth daily.  . Cholecalciferol (VITAMIN D3) 2000 UNITS capsule Take 2,000 Units by mouth daily. am  . clopidogrel (PLAVIX) 75 MG tablet Take 1 tablet (75 mg total) by mouth daily.  . fexofenadine (ALLEGRA) 180 MG tablet Take 1 tablet (180 mg total) by mouth daily as needed for allergies or rhinitis.  . fluticasone (FLOVENT HFA)  110 MCG/ACT inhaler Inhale 2 puffs into the lungs 2 (two) times daily. Rinse your mouth  . folic acid (FOLVITE) 1 MG tablet Take 0.8 mg by mouth daily. am  . hydrALAZINE (APRESOLINE) 25 MG tablet Take 1 tablet (25 mg total) by mouth 3 (three) times daily.  . metoprolol tartrate (LOPRESSOR) 50 MG tablet Take 1 tablet (50 mg total) by mouth 2 (two) times daily.  . Multiple Vitamins-Minerals (CENTRUM SILVER 50+MEN PO) Take by mouth.  . pantoprazole (PROTONIX) 40 MG tablet Take 1 tablet (40 mg total) by mouth 2 (two) times daily before a meal. TRY TO TAKE ONLY 1X PER DAY INSTEAD OF 2 30 MIN BEFORE A MEAL  . simvastatin (ZOCOR) 40 MG tablet Take 1 tablet (40 mg total) by mouth daily. am   Current Facility-Administered Medications for the 09/02/19 encounter (Office Visit) with McLean-Scocuzza, Nino Glow, MD  Medication  . ipratropium-albuterol (DUONEB) 0.5-2.5 (3) MG/3ML nebulizer solution 3 mL   Allergies  Allergen Reactions  . Fexofenadine-Pseudoephed Er Swelling    Tongue and face. Only with D product. Tolerates plain allegra at home.  .   . Nifedipine Swelling    Tongue and face    . Allegra  [Fexofenadine] Swelling    Tongue and face. Only with D product. Tolerates plain allegra at home.   Jearl Klinefelter Ellipta [Umeclidinium-Vilanterol]   . Prednisone Other (See Comments)    Oral prednisone. Confusion. Mental status changes   Recent Results (from the past 2160 hour(s))  PSA     Status: None   Collection Time: 07/07/19  8:22 AM  Result Value Ref Range   PSA 0.29 0.10 - 4.00 ng/mL    Comment: Test performed using Access  Hybritech PSA Assay, a parmagnetic partical, chemiluminecent immunoassay.  TSH     Status: None   Collection Time: 07/07/19  8:22 AM  Result Value Ref Range   TSH 2.77 0.35 - 4.50 uIU/mL  Urinalysis, Routine w reflex microscopic     Status: None   Collection Time: 07/07/19  8:22 AM  Result Value Ref Range   Color, Urine YELLOW YELLOW   APPearance CLEAR CLEAR   Specific Gravity, Urine 1.013 1.001 - 1.03   pH 6.5 5.0 - 8.0   Glucose, UA NEGATIVE NEGATIVE   Bilirubin Urine NEGATIVE NEGATIVE   Ketones, ur NEGATIVE NEGATIVE   Hgb urine dipstick NEGATIVE NEGATIVE   Protein, ur NEGATIVE NEGATIVE   Nitrite NEGATIVE NEGATIVE   Leukocytes,Ua NEGATIVE NEGATIVE  Hemoglobin A1c     Status: None   Collection Time: 07/07/19  8:22 AM  Result Value Ref Range   Hgb A1c MFr Bld 6.4 4.6 - 6.5 %    Comment: Glycemic Control Guidelines for People with Diabetes:Non Diabetic:  <6%Goal of Therapy: <7%Additional Action Suggested:  >8%   CBC with Differential/Platelet     Status: Abnormal   Collection Time: 07/07/19  8:22 AM  Result Value Ref Range   WBC 7.5 4.0 - 10.5 K/uL   RBC 4.28 4.22 - 5.81 Mil/uL   Hemoglobin 15.3 13.0 - 17.0 g/dL   HCT 44.8 39.0 - 52.0 %   MCV 104.5 (H) 78.0 - 100.0 fl   MCHC 34.2 30.0 - 36.0 g/dL   RDW 13.5 11.5 - 15.5 %   Platelets 231.0 150.0 - 400.0 K/uL   Neutrophils Relative % 69.2 43.0 - 77.0 %   Lymphocytes Relative 20.1 12.0 - 46.0 %   Monocytes Relative 8.5 3.0 - 12.0 %   Eosinophils Relative  1.4 0.0 - 5.0 %   Basophils Relative 0.8 0.0 - 3.0 %   Neutro Abs 5.2 1.4 - 7.7 K/uL   Lymphs Abs 1.5 0.7 - 4.0 K/uL   Monocytes Absolute 0.6 0.1 - 1.0 K/uL   Eosinophils Absolute 0.1 0.0 - 0.7 K/uL   Basophils Absolute 0.1 0.0 - 0.1 K/uL  Lipid panel     Status: None   Collection Time: 07/07/19  8:22 AM  Result Value Ref Range   Cholesterol 130 0 - 200 mg/dL    Comment: ATP III Classification       Desirable:  < 200 mg/dL               Borderline High:  200 - 239 mg/dL           High:  > = 240 mg/dL   Triglycerides 117.0 0.0 - 149.0 mg/dL    Comment: Normal:  <150 mg/dLBorderline High:  150 - 199 mg/dL   HDL 39.90 >39.00 mg/dL   VLDL 23.4 0.0 - 40.0 mg/dL   LDL Cholesterol 66 0 - 99 mg/dL   Total CHOL/HDL Ratio 3     Comment:                Men          Women1/2 Average Risk     3.4          3.3Average Risk          5.0          4.42X Average Risk          9.6          7.13X Average Risk          15.0          11.0                       NonHDL 89.87     Comment: NOTE:  Non-HDL goal should be 30 mg/dL higher than patient's LDL goal (i.e. LDL goal of < 70 mg/dL, would have non-HDL goal of < 100 mg/dL)  Comprehensive metabolic panel     Status: Abnormal   Collection Time: 07/07/19  8:22 AM  Result Value Ref Range   Sodium 139 135 - 145 mEq/L   Potassium 4.6 3.5 - 5.1 mEq/L   Chloride 102 96 - 112 mEq/L   CO2 27 19 - 32 mEq/L   Glucose, Bld 125 (H) 70 - 99 mg/dL   BUN 23 6 - 23 mg/dL   Creatinine, Ser 1.49 0.40 - 1.50 mg/dL   Total Bilirubin 0.9 0.2 - 1.2 mg/dL   Alkaline Phosphatase 48 39 - 117 U/L   AST 25 0 - 37 U/L   ALT 23 0 - 53 U/L   Total Protein 7.3 6.0 - 8.3 g/dL   Albumin 4.1 3.5 - 5.2 g/dL   GFR 45.48 (L) >60.00 mL/min   Calcium 9.5 8.4 - 10.5 mg/dL  SARS CORONAVIRUS 2 (TAT 6-24 HRS) Nasopharyngeal Nasopharyngeal Swab     Status: None   Collection Time: 07/17/19 11:26 AM   Specimen: Nasopharyngeal Swab  Result Value Ref Range   SARS Coronavirus 2 NEGATIVE NEGATIVE    Comment: (NOTE) SARS-CoV-2 target nucleic acids are NOT DETECTED. The SARS-CoV-2 RNA is generally detectable in upper and lower respiratory specimens during the acute phase of infection. Negative results do not preclude SARS-CoV-2 infection, do not rule out co-infections with other pathogens, and should  not be used as the sole basis for treatment or other patient management decisions. Negative results must be combined with clinical observations, patient history, and  epidemiological information. The expected result is Negative. Fact Sheet for Patients: SugarRoll.be Fact Sheet for Healthcare Providers: https://www.woods-mathews.com/ This test is not yet approved or cleared by the [place] FDA and  has been authorized for detection and/or diagnosis of SARS-CoV-2 by FDA under an Emergency Use Authorization (EUA). This EUA will remain  in effect (meaning this test can be used) for the duration of the COVID-19 declaration under Section 56 4(b)(1) of the Act, 21 U.S.C. section 360bbb-3(b)(1), unless the authorization is terminated or revoked sooner. Performed at [facility], [place] [address] [place], [place] [address]    Objective  Body mass index is 26.95 kg/m. Wt Readings from Last 3 Encounters:  09/02/19 221 lb 6.4 oz (100.4 kg)  07/17/19 221 lb (100.2 kg)  07/01/19 221 lb (100.2 kg)   Temp Readings from Last 3 Encounters:  09/02/19 97.8 F (36.6 C) (Temporal)  07/17/19 98.4 F (36.9 C) (Oral)  04/05/19 (!) 96.3 F (35.7 C) (Oral)   BP Readings from Last 3 Encounters:  09/02/19 132/66  07/17/19 (!) 179/77  07/01/19 (!) 160/69   Pulse Readings from Last 3 Encounters:  09/02/19 65  07/17/19 70  07/01/19 60    Physical Exam Vitals and nursing note reviewed.  Constitutional:      Appearance: Normal appearance. He is well-developed, well-groomed and overweight.  HENT:     Head: Normocephalic and atraumatic.  Eyes:     Conjunctiva/sclera: Conjunctivae normal.     Pupils: Pupils are equal, round, and reactive to light.  Cardiovascular:     Rate and Rhythm: Normal rate and regular rhythm.     Heart sounds: Normal heart sounds. No murmur.  Pulmonary:     Effort: Pulmonary effort is normal.     Breath sounds: Normal breath sounds.  Skin:    General: Skin is warm and dry.     Comments: aks right neck pre and post auricular, forehead left temple preauricular, left cheek, hands and  forearms diffuse   Neurological:     General: No focal deficit present.     Mental Status: He is alert and oriented to person, place, and time. Mental status is at baseline.     Gait: Gait normal.  Psychiatric:        Attention and Perception: Attention and perception normal.        Mood and Affect: Mood and affect normal.        Speech: Speech normal.        Behavior: Behavior normal. Behavior is cooperative.        Thought Content: Thought content normal.        Cognition and Memory: Cognition and memory normal.        Judgment: Judgment normal.     Assessment  Plan  Left otitis media, unspecified otitis media type - Plan: amoxicillin-clavulanate (AUGMENTIN) 875-125 MG tablet F/u with ENT if not better   Stenosis of carotid artery, b/l with complete occlusion right and 40-59% left s/p CEA b/l - Plan: clopidogrel (PLAVIX) 75 MG tablet Renal artery stenosis (HCC) - Plan: clopidogrel (PLAVIX) 75 MG tablet History of stroke - Plan: clopidogrel (PLAVIX) 75 MG tablet Hx of CABG x5 and CAD with stable angina- Plan: clopidogrel (PLAVIX) 75 MG tablet F/u with Dr. Lucky Cowboy and Dr. Raliegh Ip Cards  Pending EKG/stress test, echo per cards  09/28/19  -will disc with Dr. Lucky Cowboy and cards if aspirin and plavix is sufficient or if pt should be changed to xarelto vs other with aspirin due to multiple sites of atherosclerosis?  Also does pt need higher intensity statin like lipitor or crestor currently on zocor 40 mg qhs  Acute pain of right shoulder - Plan: DG Shoulder right if returns in future sch Xray right shoulder here   Renovascular HTN with Stage 3a chronic kidney disease BP improved cont meds  F/u with Dr. Candiss Norse renal   Double vision ? If sxmatic right CAS Consider MRI brain if continues  F/u with eye MD, f/u [facility]  Actinic keratosis  [facility] 08/09/19 with ln2 rec go back for more ln2 face, neck forearms pt and wife to call for appt  HM Flu shotutd pna 23utd prevnar utd Tdap utd 07/05/2018   Had 2/2 pfizer utd Considershingrix in future if not had  Hep C neg 10/07/13  Former smoker since age 59 y.o to 1 max 2ppd  cologaurd3/16/20 neg PSAnormal3/2/20 0.38   Cards Dr. Raliegh Ip  Renal Dr. Candiss Norse  Vascular Dr. Lucky Cowboy  pulm Dr. Francesca Oman [facility] derm  Neurology Dr. Melrose Nakayama (2017) AE Dr. Wallace Going  Provider: Dr. Olivia Mackie McLean-Scocuzza-Internal Medicine

## 2019-09-05 NOTE — Telephone Encounter (Signed)
See 05/07 pt message encounter. Message added there

## 2019-09-06 ENCOUNTER — Other Ambulatory Visit (INDEPENDENT_AMBULATORY_CARE_PROVIDER_SITE_OTHER): Payer: Self-pay | Admitting: Nurse Practitioner

## 2019-09-06 ENCOUNTER — Telehealth: Payer: Self-pay | Admitting: Internal Medicine

## 2019-09-06 ENCOUNTER — Other Ambulatory Visit: Payer: Self-pay | Admitting: Internal Medicine

## 2019-09-06 DIAGNOSIS — J4530 Mild persistent asthma, uncomplicated: Secondary | ICD-10-CM

## 2019-09-06 NOTE — Telephone Encounter (Signed)
Can you fax to cardiology below ?  And see if they can change medications for pt NOAG +aspirin and higher intensity statin?   MTS   Dew, Erskine Squibb, MD  McLean-Scocuzza, Nino Glow, MD  A higher intensity statin is probably a good idea. Switching to a NOAG + aspirin instead of Plavix is also probably a good idea. Appreciate your taking care of him. Nice fellow.          Previous Messages   ----- Message -----  From: McLean-Scocuzza, Nino Glow, MD  Sent: 09/02/2019  7:24 PM EDT  To: Algernon Huxley, MD   Also does pt need higher intensity statin like lipitor or crestor?    08/22/2019 Office Visit [facility]  [place], [place] [address]  [phone]  Flossie Dibble, [place]a Loomis  [facility]  [place], [place] [address]  [phone]  [phone] (Fax

## 2019-09-06 NOTE — Telephone Encounter (Signed)
-----   Message from Algernon Huxley, MD sent at 09/05/2019  8:12 AM EDT ----- A higher intensity statin is probably a good idea. Switching to a NOAG + aspirin instead of Plavix is also probably a good idea.  Appreciate your taking care of him.  Nice fellow.    ----- Message ----- From: McLean-Scocuzza, Nino Glow, MD Sent: 09/02/2019   7:24 PM EDT To: Algernon Huxley, MD  Also does pt need higher intensity statin like lipitor or crestor?

## 2019-09-06 NOTE — Telephone Encounter (Signed)
Note faxed to [facility] via [place] to the documented fax number and provider below.

## 2019-09-06 NOTE — Telephone Encounter (Signed)
Is he allergic to allegra? Just got a refill request and comes up in allergy list but the allegra D?   [place]

## 2019-09-07 ENCOUNTER — Telehealth: Payer: Self-pay | Admitting: Internal Medicine

## 2019-09-07 ENCOUNTER — Other Ambulatory Visit (INDEPENDENT_AMBULATORY_CARE_PROVIDER_SITE_OTHER): Payer: Self-pay | Admitting: Nurse Practitioner

## 2019-09-07 MED ORDER — APIXABAN 5 MG PO TABS: 5.0000 mg | ORAL_TABLET | Freq: Two times a day (BID) | ORAL | 5 refills | Status: DC

## 2019-09-07 NOTE — Progress Notes (Signed)
Patient was called and advised to discontinue the Plavix. Eliquis will be called into the patient's pharmacy and he is to start taking the Eliquis as directed.

## 2019-09-07 NOTE — Telephone Encounter (Signed)
-----   Message from Delorise Jackson, MD sent at 09/02/2019  7:22 PM EDT ----- Yves Dill please fax this to [facility] cards attn Dr. Raliegh Ip please Drs. Dew and [employer] patient -will disc with Dr. Lucky Cowboy and cards if aspirin and plavix is sufficient or if pt should be changed to xarelto vs other with aspirin due to multiple sites of atherosclerosis?Stenosis of carotid artery, b/l with complete occlusion right and 40-59% left s/p CEA b/l - Plan: clopidogrel (PLAVIX) 75 MG tabletRenal artery stenosis (HCC) - Plan: clopidogrel (PLAVIX) 75 MG tabletHistory of stroke - Plan: clopidogrel (PLAVIX) 75 MG tabletHx of CABG x5 and CAD with stable angina- Plan: clopidogrel (PLAVIX) 75 MG tabletF/u with Dr. Lucky Cowboy and Dr. Raliegh Ip Cards Pending EKG/stress test, echo per cards 09/28/19

## 2019-09-07 NOTE — Telephone Encounter (Signed)
Faxed via [employer] with below message on the cover to [phone]

## 2019-09-07 NOTE — Telephone Encounter (Signed)
McLean-Scocuzza, Nino Glow, MD  Thressa Sheller, CMA  Also does pt need higher intensity statin like lipitor or crestor?    Also faxed

## 2019-09-08 ENCOUNTER — Other Ambulatory Visit: Payer: Self-pay | Admitting: Internal Medicine

## 2019-09-08 ENCOUNTER — Encounter: Payer: Self-pay | Admitting: Internal Medicine

## 2019-09-08 DIAGNOSIS — J4530 Mild persistent asthma, uncomplicated: Secondary | ICD-10-CM

## 2019-09-08 MED ORDER — FEXOFENADINE HCL 180 MG PO TABS: 180.0000 mg | ORAL_TABLET | Freq: Every day | ORAL | 3 refills | Status: DC | PRN

## 2019-09-08 NOTE — Telephone Encounter (Signed)
Patient states he is only allergic to the Allegra D and not the Allegra 180 mg he is on. Informed the patient of his medications having been sent in.

## 2019-09-20 DIAGNOSIS — L570 Actinic keratosis: Secondary | ICD-10-CM | POA: Diagnosis not present

## 2019-09-28 ENCOUNTER — Other Ambulatory Visit: Payer: Self-pay

## 2019-09-28 ENCOUNTER — Ambulatory Visit (INDEPENDENT_AMBULATORY_CARE_PROVIDER_SITE_OTHER): Payer: Medicare Other

## 2019-09-28 ENCOUNTER — Ambulatory Visit: Payer: Medicare Other

## 2019-09-28 ENCOUNTER — Other Ambulatory Visit: Payer: Self-pay | Admitting: Internal Medicine

## 2019-09-28 ENCOUNTER — Other Ambulatory Visit: Payer: Medicare Other

## 2019-09-28 DIAGNOSIS — M25512 Pain in left shoulder: Secondary | ICD-10-CM

## 2019-09-28 DIAGNOSIS — M25511 Pain in right shoulder: Secondary | ICD-10-CM

## 2019-09-28 DIAGNOSIS — I25118 Atherosclerotic heart disease of native coronary artery with other forms of angina pectoris: Secondary | ICD-10-CM | POA: Diagnosis not present

## 2019-10-05 ENCOUNTER — Other Ambulatory Visit: Payer: Self-pay | Admitting: Internal Medicine

## 2019-10-05 ENCOUNTER — Telehealth: Payer: Self-pay | Admitting: Internal Medicine

## 2019-10-05 DIAGNOSIS — E782 Mixed hyperlipidemia: Secondary | ICD-10-CM | POA: Diagnosis not present

## 2019-10-05 DIAGNOSIS — E785 Hyperlipidemia, unspecified: Secondary | ICD-10-CM

## 2019-10-05 DIAGNOSIS — I700 Atherosclerosis of aorta: Secondary | ICD-10-CM | POA: Diagnosis not present

## 2019-10-05 DIAGNOSIS — J310 Chronic rhinitis: Secondary | ICD-10-CM | POA: Diagnosis not present

## 2019-10-05 DIAGNOSIS — I10 Essential (primary) hypertension: Secondary | ICD-10-CM | POA: Diagnosis not present

## 2019-10-05 DIAGNOSIS — J439 Emphysema, unspecified: Secondary | ICD-10-CM | POA: Diagnosis not present

## 2019-10-05 DIAGNOSIS — I2510 Atherosclerotic heart disease of native coronary artery without angina pectoris: Secondary | ICD-10-CM | POA: Diagnosis not present

## 2019-10-05 DIAGNOSIS — I6523 Occlusion and stenosis of bilateral carotid arteries: Secondary | ICD-10-CM | POA: Diagnosis not present

## 2019-10-05 DIAGNOSIS — R05 Cough: Secondary | ICD-10-CM | POA: Diagnosis not present

## 2019-10-05 MED ORDER — ATORVASTATIN CALCIUM 40 MG PO TABS: 40.0000 mg | ORAL_TABLET | Freq: Every day | ORAL | 3 refills | Status: DC

## 2019-10-05 NOTE — Telephone Encounter (Signed)
Is pt agreeable to change zocor 40 to lipitor or crestor

## 2019-10-05 NOTE — Telephone Encounter (Signed)
Left message to return call 

## 2019-10-05 NOTE — Telephone Encounter (Signed)
Pt called back returning your call  Please call on the home phone

## 2019-10-05 NOTE — Telephone Encounter (Signed)
Spoke with the patient and his wife. They are agreeable to the Lipitor. He will stop the Zocor/Simvastatin.   Patient has been having pain and numbness in his left shoulder. Down into the arm and fingers. Wife states that the Patient has had an x-ray of this. They would like a referral to Ortho.

## 2019-10-14 NOTE — Telephone Encounter (Signed)
Referral placed [facility]   [place]

## 2019-10-14 NOTE — Addendum Note (Signed)
Addended by: Orland Mustard on: 10/14/2019 01:09 PM   Modules accepted: Orders

## 2019-10-20 ENCOUNTER — Telehealth: Payer: Self-pay | Admitting: Internal Medicine

## 2019-10-20 NOTE — Telephone Encounter (Signed)
Called pt and let them know it was ready to be picked up.

## 2019-10-20 NOTE — Telephone Encounter (Signed)
Pt's wife needs a copy of pt's xray of left shoulder on 6/2 to take to [facility] tomorrow @ 2pm. She is asking for a call when it is ready. She will not pick it up until tomorrow 10/21/19.

## 2019-10-20 NOTE — Telephone Encounter (Signed)
Making the disk now and it will be ready for them tomorrow

## 2019-10-21 ENCOUNTER — Encounter: Payer: Self-pay | Admitting: Internal Medicine

## 2019-10-21 DIAGNOSIS — M5412 Radiculopathy, cervical region: Secondary | ICD-10-CM | POA: Diagnosis not present

## 2019-10-24 ENCOUNTER — Other Ambulatory Visit: Payer: Self-pay | Admitting: Internal Medicine

## 2019-10-24 DIAGNOSIS — J209 Acute bronchitis, unspecified: Secondary | ICD-10-CM

## 2019-10-24 DIAGNOSIS — J4530 Mild persistent asthma, uncomplicated: Secondary | ICD-10-CM

## 2019-10-24 MED ORDER — FLOVENT HFA 110 MCG/ACT IN AERO: 2.0000 | INHALATION_SPRAY | Freq: Two times a day (BID) | RESPIRATORY_TRACT | 12 refills | Status: DC

## 2019-10-24 NOTE — Telephone Encounter (Signed)
Pt wife called to check on refill status.  pt will be out of medication today

## 2019-12-26 DIAGNOSIS — R29700 NIHSS score 0: Secondary | ICD-10-CM | POA: Diagnosis not present

## 2019-12-26 DIAGNOSIS — Z951 Presence of aortocoronary bypass graft: Secondary | ICD-10-CM | POA: Diagnosis not present

## 2019-12-26 DIAGNOSIS — Z7982 Long term (current) use of aspirin: Secondary | ICD-10-CM | POA: Diagnosis not present

## 2019-12-26 DIAGNOSIS — R4789 Other speech disturbances: Secondary | ICD-10-CM | POA: Diagnosis not present

## 2019-12-26 DIAGNOSIS — I2510 Atherosclerotic heart disease of native coronary artery without angina pectoris: Secondary | ICD-10-CM | POA: Diagnosis not present

## 2019-12-26 DIAGNOSIS — R531 Weakness: Secondary | ICD-10-CM | POA: Diagnosis not present

## 2019-12-26 DIAGNOSIS — J449 Chronic obstructive pulmonary disease, unspecified: Secondary | ICD-10-CM | POA: Diagnosis not present

## 2019-12-26 DIAGNOSIS — Z79899 Other long term (current) drug therapy: Secondary | ICD-10-CM | POA: Diagnosis not present

## 2019-12-26 DIAGNOSIS — Z8673 Personal history of transient ischemic attack (TIA), and cerebral infarction without residual deficits: Secondary | ICD-10-CM | POA: Diagnosis not present

## 2019-12-26 DIAGNOSIS — R4781 Slurred speech: Secondary | ICD-10-CM | POA: Diagnosis not present

## 2019-12-26 DIAGNOSIS — E785 Hyperlipidemia, unspecified: Secondary | ICD-10-CM | POA: Diagnosis not present

## 2019-12-26 DIAGNOSIS — N189 Chronic kidney disease, unspecified: Secondary | ICD-10-CM | POA: Diagnosis not present

## 2019-12-26 DIAGNOSIS — Z7901 Long term (current) use of anticoagulants: Secondary | ICD-10-CM | POA: Diagnosis not present

## 2019-12-26 DIAGNOSIS — I6523 Occlusion and stenosis of bilateral carotid arteries: Secondary | ICD-10-CM | POA: Diagnosis not present

## 2019-12-26 DIAGNOSIS — Z888 Allergy status to other drugs, medicaments and biological substances status: Secondary | ICD-10-CM | POA: Diagnosis not present

## 2019-12-26 DIAGNOSIS — R0689 Other abnormalities of breathing: Secondary | ICD-10-CM | POA: Diagnosis not present

## 2019-12-26 DIAGNOSIS — I6529 Occlusion and stenosis of unspecified carotid artery: Secondary | ICD-10-CM | POA: Diagnosis not present

## 2019-12-26 DIAGNOSIS — Z9889 Other specified postprocedural states: Secondary | ICD-10-CM | POA: Diagnosis not present

## 2019-12-26 DIAGNOSIS — I480 Paroxysmal atrial fibrillation: Secondary | ICD-10-CM | POA: Diagnosis not present

## 2019-12-26 DIAGNOSIS — I639 Cerebral infarction, unspecified: Secondary | ICD-10-CM | POA: Diagnosis not present

## 2019-12-26 DIAGNOSIS — Z743 Need for continuous supervision: Secondary | ICD-10-CM | POA: Diagnosis not present

## 2019-12-26 DIAGNOSIS — I129 Hypertensive chronic kidney disease with stage 1 through stage 4 chronic kidney disease, or unspecified chronic kidney disease: Secondary | ICD-10-CM | POA: Diagnosis not present

## 2019-12-26 DIAGNOSIS — I6521 Occlusion and stenosis of right carotid artery: Secondary | ICD-10-CM | POA: Diagnosis not present

## 2019-12-26 DIAGNOSIS — Z8249 Family history of ischemic heart disease and other diseases of the circulatory system: Secondary | ICD-10-CM | POA: Diagnosis not present

## 2019-12-26 DIAGNOSIS — G459 Transient cerebral ischemic attack, unspecified: Secondary | ICD-10-CM | POA: Diagnosis not present

## 2019-12-26 DIAGNOSIS — R4701 Aphasia: Secondary | ICD-10-CM | POA: Diagnosis not present

## 2019-12-26 DIAGNOSIS — R6889 Other general symptoms and signs: Secondary | ICD-10-CM | POA: Diagnosis not present

## 2019-12-26 DIAGNOSIS — Z87891 Personal history of nicotine dependence: Secondary | ICD-10-CM | POA: Diagnosis not present

## 2019-12-26 DIAGNOSIS — R29810 Facial weakness: Secondary | ICD-10-CM | POA: Diagnosis not present

## 2019-12-26 DIAGNOSIS — I4891 Unspecified atrial fibrillation: Secondary | ICD-10-CM | POA: Diagnosis not present

## 2019-12-27 DIAGNOSIS — G459 Transient cerebral ischemic attack, unspecified: Secondary | ICD-10-CM | POA: Diagnosis not present

## 2020-01-03 ENCOUNTER — Other Ambulatory Visit: Payer: Self-pay

## 2020-01-03 ENCOUNTER — Ambulatory Visit (INDEPENDENT_AMBULATORY_CARE_PROVIDER_SITE_OTHER): Payer: Medicare Other

## 2020-01-03 ENCOUNTER — Ambulatory Visit (INDEPENDENT_AMBULATORY_CARE_PROVIDER_SITE_OTHER): Payer: Medicare Other | Admitting: Vascular Surgery

## 2020-01-03 ENCOUNTER — Encounter (INDEPENDENT_AMBULATORY_CARE_PROVIDER_SITE_OTHER): Payer: Self-pay | Admitting: Vascular Surgery

## 2020-01-03 VITALS — BP 156/71 | HR 67 | Ht 76.0 in | Wt 220.0 lb

## 2020-01-03 DIAGNOSIS — I701 Atherosclerosis of renal artery: Secondary | ICD-10-CM

## 2020-01-03 DIAGNOSIS — I10 Essential (primary) hypertension: Secondary | ICD-10-CM | POA: Diagnosis not present

## 2020-01-03 DIAGNOSIS — I480 Paroxysmal atrial fibrillation: Secondary | ICD-10-CM | POA: Diagnosis not present

## 2020-01-03 DIAGNOSIS — I6523 Occlusion and stenosis of bilateral carotid arteries: Secondary | ICD-10-CM

## 2020-01-03 NOTE — Assessment & Plan Note (Signed)
To be checked early next year.

## 2020-01-03 NOTE — Assessment & Plan Note (Signed)
On anticoagulation 

## 2020-01-03 NOTE — Assessment & Plan Note (Signed)
The patient is now 6 days postop from right carotid endarterectomy at [facility] which was a redo endarterectomy.  He went there as our hospital had excessive weight times and he needed to be treated critically as he was symptomatic.  He is doing well.  No further symptoms.We did a duplex today which showed mildly elevated velocities in both internal carotid systems that would suggest 40 to 59% stenosis bilaterally on the lower end of that range.  In reviewing their CT angiogram that is consistent with where it is now on the left side.  He does have a V4 segment with a high-grade stenosis that is similar to what it was back in 2017 on imaging.  He said they had made mention of fixing his left side as well but with a mild to moderate stenosis in the left carotid I would not recommend any intervention at this time.  They may have been speaking of the intracranial lesion but that is also been stable for 4 years and is an intracranial lesion, so without focal symptoms would probably not intervene on that either.  I will plan to see him back in about 6 weeks and follow-up and happy to follow him going forward if needed.  I also told him he is welcome to go back to the surgeon at [facility] who did an excellent job taking care of him.

## 2020-01-03 NOTE — Progress Notes (Signed)
MRN : [id number]  Keith Cruz is a 79 y.o. ([birth year]) male who presents with chief complaint of  Chief Complaint  Patient presents with  . Follow-up    56mo carotid  .  History of Present Illness: Patient returns today in follow up of his carotid disease.  About a week ago, he actually went to [facility] for TIA symptoms and was found to have a critical right carotid stenosis by CT angiogram and not the suspected occlusion.  He underwent a redo right carotid endarterectomy which sounds as if it was technically difficult but went reasonably well when he was discharged Friday.  He has had no further symptoms.  He says they had mentioned fixing his left side as well which was the side that we had done an endarterectomy on and has had mild recurrent stenosis in the 40 to 50% range.  In reviewing their CT angiogram that is consistent with where it is now.  He does have a V4 segment with a high-grade stenosis that is similar to what it was back in 2017 on imaging.  We did a duplex today which showed mildly elevated velocities in both internal carotid systems that would suggest 40 to 59% stenosis bilaterally on the lower end of that range.  Current Outpatient Medications  Medication Sig Dispense Refill  . ACCU-CHEK AVIVA PLUS test strip CHECK BLOOD SUGAR 1 X DAILY 100 strip 5  . albuterol (VENTOLIN HFA) 108 (90 Base) MCG/ACT inhaler Inhale 1-2 puffs into the lungs every 4 (four) hours as needed for wheezing or shortness of breath. 1 Inhaler 12  . apixaban (ELIQUIS) 5 MG TABS tablet Take 1 tablet (5 mg total) by mouth 2 (two) times daily. 60 tablet 5  . aspirin EC 81 MG tablet Take 81 mg by mouth daily.    . Cholecalciferol (VITAMIN D3) 2000 UNITS capsule Take 2,000 Units by mouth daily. am    . cyclobenzaprine (FLEXERIL) 5 MG tablet Take 1-2 tablets (5-10 mg total) by mouth at bedtime as needed for muscle spasms. 60 tablet 2  . fexofenadine (ALLEGRA) 180 MG tablet Take 1 tablet (180 mg total) by mouth  daily as needed for allergies or rhinitis. 90 tablet 3  . fluticasone (FLONASE) 50 MCG/ACT nasal spray Place 2 sprays into both nostrils daily. 16 g 0  . fluticasone (FLOVENT HFA) 110 MCG/ACT inhaler Inhale 2 puffs into the lungs 2 (two) times daily. Rinse your mouth 1 Inhaler 12  . folic acid (FOLVITE) 1 MG tablet Take 0.8 mg by mouth daily. am    . hydrALAZINE (APRESOLINE) 25 MG tablet Take 1 tablet (25 mg total) by mouth 3 (three) times daily. 270 tablet 3  . metoprolol tartrate (LOPRESSOR) 50 MG tablet Take 1 tablet (50 mg total) by mouth 2 (two) times daily. 180 tablet 3  . Multiple Vitamins-Minerals (CENTRUM SILVER 50+MEN PO) Take by mouth.    . pantoprazole (PROTONIX) 40 MG tablet Take 1 tablet (40 mg total) by mouth 2 (two) times daily before a meal. TRY TO TAKE ONLY 1X PER DAY INSTEAD OF 2 30 MIN BEFORE A MEAL 180 tablet 1  . simvastatin (ZOCOR) 40 MG tablet Take 40 mg by mouth daily.    Marland Kitchen amoxicillin-clavulanate (AUGMENTIN) 875-125 MG tablet Take 1 tablet by mouth 2 (two) times daily. With food 14 tablet 0  . atorvastatin (LIPITOR) 40 MG tablet Take 1 tablet (40 mg total) by mouth daily. 90 tablet 3   Current Facility-Administered Medications  Medication  Dose Route Frequency Provider Last Rate Last Admin  . ipratropium-albuterol (DUONEB) 0.5-2.5 (3) MG/3ML nebulizer solution 3 mL  3 mL Nebulization Once Olin Hauser, DO        Past Medical History:  Diagnosis Date  . Arthritis    back  . Asthma   . Atherosclerosis of aorta ([place])   . Atherosclerosis of aorta ([place])   . Bronchitis 05/2017   chronic  . CAD (coronary artery disease)   . Carotid artery disease ([place])   . Carotid stenosis   . Carotid stenosis, bilateral   . Chronic bronchitis ([place])   . Chronic cough   . COPD (chronic obstructive pulmonary disease) ([place])   . Dysrhythmia   . GERD (gastroesophageal reflux disease)   . Heart murmur   . HOH (hard of hearing)    hearing aids  . Hypercholesteremia   .  Hypertension   . Kidney disease    stent  . Macular degeneration of left eye   . Myocardial infarction ([place]) 2016  . Pre-diabetes   . Seasonal allergies   . Seizure ([place])   . Seizures ([facility])    viral meningitis  . Stroke [facility])    1991 and imaging 05/12/15 old strokes  . TIA (transient ischemic attack) 1991   x2  . Uses hearing aid   . Viral meningitis 2017  . Viral meningitis   . Wears dentures    upper and lower    Past Surgical History:  Procedure Laterality Date  . CAROTID ENDARTERECTOMY Left 2014  . CAROTID ENDARTERECTOMY Right 1991  . CATARACT EXTRACTION W/PHACO Left 06/24/2017   Procedure: CATARACT EXTRACTION PHACO AND INTRAOCULAR LENS PLACEMENT ([place]) LEFT BORDERLINE DIABETIC;  Surgeon: Leandrew Koyanagi, MD;  Location: [place];  Service: Ophthalmology;  Laterality: Left;  . CATARACT EXTRACTION W/PHACO Right 07/22/2017   Procedure: CATARACT EXTRACTION PHACO AND INTRAOCULAR LENS PLACEMENT ([place]) RIGHT BORDERLINE DIABETIC;  Surgeon: Leandrew Koyanagi, MD;  Location: [place];  Service: Ophthalmology;  Laterality: Right;  . CORONARY ARTERY BYPASS GRAFT     x5  . SKIN LESION EXCISION Left    left ear  . URETERAL STENT PLACEMENT       Social History        Tobacco Use  . Smoking status: Former Smoker    Packs/day: 2.00    Years: 10.00    Pack years: 20.00    Types: Cigarettes    Quit date: 04/28/1970    Years since quitting: 49.2  . Smokeless tobacco: Former Network engineer Use Topics  . Alcohol use: No    Alcohol/week: 0.0 standard drinks    Comment: previous  . Drug use: No         Family History  Problem Relation Age of Onset  . Stroke Mother   . Heart attack Mother   . Stroke Father   . Stroke Paternal Grandmother   . Stroke Paternal Grandfather   . Alzheimer's disease Paternal Grandfather   . Stroke Sister   . Alzheimer's disease Sister   . Stroke Brother   . Alzheimer's disease Maternal  Grandmother           Allergies  Allergen Reactions  . Fexofenadine-Pseudoephed Er Swelling    Tongue and face. Only with D product. Tolerates plain allegra at home.  .   . Nifedipine Swelling    Tongue and face    . Allegra  [Fexofenadine] Swelling    Tongue and face. Only with D product. Tolerates  plain allegra at home.   Jearl Klinefelter Ellipta [Umeclidinium-Vilanterol]   . Prednisone Other (See Comments)    Oral prednisone. Confusion. Mental status changes     REVIEW OF SYSTEMS (Negative unless checked)  Constitutional: [] ?Weight loss  [] ?Fever  [] ?Chills Cardiac: [] ?Chest pain   [] ?Chest pressure   [x] ?Palpitations   [] ?Shortness of breath when laying flat   [] ?Shortness of breath at rest   [x] ?Shortness of breath with exertion. Vascular:  [] ?Pain in legs with walking   [] ?Pain in legs at rest   [] ?Pain in legs when laying flat   [] ?Claudication   [] ?Pain in feet when walking  [] ?Pain in feet at rest  [] ?Pain in feet when laying flat   [] ?History of DVT   [] ?Phlebitis   [] ?Swelling in legs   [] ?Varicose veins   [] ?Non-healing ulcers Pulmonary:   [] ?Uses home oxygen   [] ?Productive cough   [] ?Hemoptysis   [] ?Wheeze  [] ?COPD   [] ?Asthma Neurologic:  [] ?Dizziness  [] ?Blackouts   [] ?Seizures   [] ?History of stroke   [x] ?History of TIA  [] ?Aphasia   [] ?Temporary blindness   [] ?Dysphagia   [] ?Weakness or numbness in arms   [] ?Weakness or numbness in legs Musculoskeletal:  [] ?Arthritis   [] ?Joint swelling   [] ?Joint pain   [] ?Low back pain Hematologic:  [] ?Easy bruising  [] ?Easy bleeding   [] ?Hypercoagulable state   [] ?Anemic   Gastrointestinal:  [] ?Blood in stool   [] ?Vomiting blood  [] ?Gastroesophageal reflux/heartburn   [] ?Abdominal pain Genitourinary:  [x] ?Chronic kidney disease   [] ?Difficult urination  [] ?Frequent urination  [] ?Burning with urination   [] ?Hematuria Skin:  [] ?Rashes   [] ?Ulcers   [] ?Wounds Psychological:  [] ?History of anxiety   [] ? History of major  depression.    Physical Examination  BP (!) 156/71   Pulse 67   Ht 6\' 4"  (1.93 m)   Wt 220 lb (99.8 kg)   BMI 26.78 kg/m  Gen:  WD/WN, NAD Head: [place]/AT, No temporalis wasting. Ear/Nose/Throat: Hearing grossly intact, nares w/o erythema or drainage Eyes: Conjunctiva clear. Sclera non-icteric Neck: Supple.  Trachea midline Pulmonary:  Good air movement, no use of accessory muscles.  Cardiac: RRR, no JVD Vascular: Right CEA incision is well-healed. Vessel Right Left  Radial Palpable Palpable               Musculoskeletal: M/S 5/5 throughout.  No deformity or atrophy. No edema. Neurologic: Sensation grossly intact in extremities.  Symmetrical.  Speech is fluent.  Psychiatric: Judgment intact, Mood & affect appropriate for pt's clinical situation. Dermatologic: No rashes or ulcers noted.  No cellulitis or open wounds.       Labs No results found for this or any previous visit (from the past 2160 hour(s)).  Radiology No results found.  Assessment/Plan  Benign essential HTN blood pressure control important in reducing the progression of atherosclerotic disease. On appropriate oral medications.   Atrial fibrillation ([place]) On anticoagulation  Renal artery stenosis ([place]) To be checked early next year.  Bilateral carotid artery stenosis The patient is now 6 days postop from right carotid endarterectomy at [facility] which was a redo endarterectomy.  He went there as our hospital had excessive weight times and he needed to be treated critically as he was symptomatic.  He is doing well.  No further symptoms.We did a duplex today which showed mildly elevated velocities in both internal carotid systems that would suggest 40 to 59% stenosis bilaterally on the lower end of that range.  In reviewing their CT angiogram that is consistent  with where it is now on the left side.  He does have a V4 segment with a high-grade stenosis that is similar to what it was back in 2017 on imaging.   He said they had made mention of fixing his left side as well but with a mild to moderate stenosis in the left carotid I would not recommend any intervention at this time.  They may have been speaking of the intracranial lesion but that is also been stable for 4 years and is an intracranial lesion, so without focal symptoms would probably not intervene on that either.  I will plan to see him back in about 6 weeks and follow-up and happy to follow him going forward if needed.  I also told him he is welcome to go back to the surgeon at [facility] who did an excellent job taking care of him.    Leotis Pain, MD  01/03/2020 12:23 PM    This note was created with Dragon medical transcription system.  Any errors from dictation are purely unintentional

## 2020-01-03 NOTE — Assessment & Plan Note (Signed)
blood pressure control important in reducing the progression of atherosclerotic disease. On appropriate oral medications.  

## 2020-01-08 ENCOUNTER — Telehealth: Payer: Self-pay | Admitting: Internal Medicine

## 2020-01-08 NOTE — Telephone Encounter (Signed)
BP elevated  -is pt agreeable to try coreg=beta blocker instead of metoprolol?  This works better for BP control

## 2020-01-09 ENCOUNTER — Encounter: Payer: Self-pay | Admitting: Internal Medicine

## 2020-01-09 ENCOUNTER — Telehealth: Payer: Self-pay | Admitting: Internal Medicine

## 2020-01-09 DIAGNOSIS — I10 Essential (primary) hypertension: Secondary | ICD-10-CM

## 2020-01-09 NOTE — Telephone Encounter (Signed)
Dr. Lucky Cowboy see patient message below and then my response I feel vascular should weigh in and not primary care  Please have your office contact pt with recs  Thanks Dr. Gayland Curry  I would ask Dr. Lucky Cowboy  (vascular) and your doctors at [facility] vascular what they think is best  I am only primary care given your extent of blockages definitely need to be on a blood thinner but the choice would be up to vascular  Do you want to change metoprolol to coreg for better blood pressure control? Goal blood pressure <130/<80  I will CC your message to Dr. Lucky Cowboy and please discuss as well at your upcoming appt with [facility] best blood thinner given your current health  Dr. Olivia Mackie ===View-only below this line===   ----- Message -----      From:Keith Cruz      Sent:01/09/2020  3:16 PM EDT        BS:JGGEZ N McLean-Scocuzza, MD   Subject:Change on medication  Had carotid artery opened at [facility] on 9/2.  Have had Eliquis refilled since returning home and was shocked at the $153 price tag.  There is no generic for this.  Asked insurance to research today and was told clopedigril that I was taking would be $7.41 if doctors wanted to change the medication.  I have both on hand and will take Eliquis until one of my docs recommends the change.  I have followup appts with [facility] docs before time for refill.  Dr Lucky Cowboy also questioned need for aspirin now, that I will also cover with them.  Thank you for your attention. I trust your guidance.

## 2020-01-10 NOTE — Telephone Encounter (Signed)
Ok noted - thank you

## 2020-01-10 NOTE — Telephone Encounter (Signed)
Called and spoke with Patient and his wife. They state that since getting a high reading at Dr Bunnie Domino office his blood pressure has been running better numbers.  Patient states that his blood pressure today was 139/53 today and has been in this area since. 130/61 yesterday.   Wanting to know if he still needs to change. Please advise

## 2020-01-11 ENCOUNTER — Encounter (INDEPENDENT_AMBULATORY_CARE_PROVIDER_SITE_OTHER): Payer: Self-pay

## 2020-01-16 NOTE — Telephone Encounter (Signed)
I think Coreg works better for blood pressure than metoprolol  If he wants to stop metoprolol and try coreg please let me know  [place]

## 2020-01-17 NOTE — Telephone Encounter (Signed)
Agreeable to changing medications.   Wife states that someone they have been around tested positive via rapid testing yesterday. She wanted to know when they should test. Currently the person is waiting for a PCR re-test. Informed her that we advise testing 5 days after exposure if asymptomatic. She verbalized understanding and they will quarantine.

## 2020-01-18 ENCOUNTER — Other Ambulatory Visit: Payer: Self-pay | Admitting: Internal Medicine

## 2020-01-18 DIAGNOSIS — I10 Essential (primary) hypertension: Secondary | ICD-10-CM

## 2020-01-18 MED ORDER — HYDRALAZINE HCL 50 MG PO TABS: 50.0000 mg | ORAL_TABLET | Freq: Three times a day (TID) | ORAL | Status: DC

## 2020-01-18 NOTE — Telephone Encounter (Signed)
See 01/08/20 telephone encounter

## 2020-01-18 NOTE — Telephone Encounter (Signed)
Patient wife calling to inquire about new medication being sent in. Informed her I would re-send this message to you and it may take some time before ready at the pharmacy

## 2020-01-19 ENCOUNTER — Other Ambulatory Visit: Payer: Self-pay | Admitting: Internal Medicine

## 2020-01-19 DIAGNOSIS — K219 Gastro-esophageal reflux disease without esophagitis: Secondary | ICD-10-CM

## 2020-01-19 MED ORDER — PANTOPRAZOLE SODIUM 40 MG PO TBEC: 40.0000 mg | DELAYED_RELEASE_TABLET | Freq: Two times a day (BID) | ORAL | 3 refills | Status: DC

## 2020-01-21 DIAGNOSIS — Z20822 Contact with and (suspected) exposure to covid-19: Secondary | ICD-10-CM | POA: Diagnosis not present

## 2020-01-23 ENCOUNTER — Telehealth: Payer: Self-pay | Admitting: Internal Medicine

## 2020-01-23 ENCOUNTER — Other Ambulatory Visit: Payer: Self-pay | Admitting: Internal Medicine

## 2020-01-23 DIAGNOSIS — I10 Essential (primary) hypertension: Secondary | ICD-10-CM

## 2020-01-23 MED ORDER — CARVEDILOL 6.25 MG PO TABS: 6.2500 mg | ORAL_TABLET | Freq: Two times a day (BID) | ORAL | 0 refills | Status: DC

## 2020-01-23 MED ORDER — HYDRALAZINE HCL 50 MG PO TABS: 50.0000 mg | ORAL_TABLET | Freq: Three times a day (TID) | ORAL | 3 refills | Status: DC

## 2020-01-23 NOTE — Telephone Encounter (Signed)
To message below   Advise patient protonix at pharmacy  Stop metoprolol 50 mg bid and change to coreg 6.25 bid and if BP >130/>80 increase to 2 pills bid and let me know after 1 week   Is he agreeable to change zocor 40 to stronger cholesterol medication lipitor or crestor?   When is his f/u appt at [facility] vascular department and or neurology as well?      Patient also called in again to inquire about new medication being sent in. Please advise    Routing comment   Bernadette, Armijo  You 4 days ago   FYI BP for past 6 mornings: today 125/53, 146/54, 134/58, 119/53, 137/57, 130/51   Thressa Sheller, CMA 5 days ago     See 01/08/20 telephone encounter       Documentation   Jud, Fanguy  You 5 days ago   Called [facility] at 315 pm and they said your Rx did not come thru as they do not have it.  Please send again. Thankyou.  They are also waiting on a refill for pantoprazole.     Thressa Sheller, CMA  You 5 days ago   Okay to change dose?    Routing comment   Colvin, Blatt  You 5 days ago   Not showing in med list, hydrazaline was increased to 50 mg 3x daily on discharge from [facility].  We depend on MyChart for information  and appreciate it being kept current.  If this change and the new Rx can be added we would be grateful. Thankyou.   You  Zaidin, [place] 13 days ago  TM Thank you and keep monitoring your blood pressure goal <130/<80    Thressa Sheller, CMA  You 13 days ago   For your information, also noted in telephone encounter sent today    Routing comment   Zebadiah, Willert  You 2 weeks ago   Will follow up with them all with upcoming appts.  for collective decision. BP this am 130/51.  Thankyou.   You  Allyne Gee 2 weeks ago  TM I would ask Dr. Lucky Cowboy  (vascular) and your doctors at [facility] vascular what they think is best  I am only primary care given your extent of blockages definitely need to be on a blood thinner but the choice would be up to vascular    Do you want to change metoprolol to coreg for better blood pressure control? Goal blood pressure <130/<80  I will CC your message to Dr. Lucky Cowboy and please discuss as well at your upcoming appt with [facility] best blood thinner given your current health  Dr. Margaret Pyle, Daiva Eves, CMA  You 2 weeks ago   This is managed by vascular. For your information    Routing comment   Bo, Teicher  You 2 weeks ago   Had carotid artery opened at [facility] on 9/2.  Have had Eliquis refilled since returning home and was shocked at the $153 price tag.  There is no generic for this.  Asked insurance to research today and was told clopedigril that I was taking would be $7.41 if doctors wanted to change the medication.  I have both on hand and will take Eliquis until one of my docs recommends the change.  I have followup appts with [facility] docs before time for refill.  Dr Lucky Cowboy also questioned need for aspirin now, that I will also  cover with them.  Thank you for your attention. I trust your guidance.

## 2020-01-23 NOTE — Telephone Encounter (Signed)
Left message to return call 

## 2020-01-24 DIAGNOSIS — G459 Transient cerebral ischemic attack, unspecified: Secondary | ICD-10-CM | POA: Diagnosis not present

## 2020-01-24 DIAGNOSIS — I4891 Unspecified atrial fibrillation: Secondary | ICD-10-CM | POA: Diagnosis not present

## 2020-01-24 DIAGNOSIS — Z7901 Long term (current) use of anticoagulants: Secondary | ICD-10-CM | POA: Diagnosis not present

## 2020-01-24 DIAGNOSIS — R7303 Prediabetes: Secondary | ICD-10-CM | POA: Insufficient documentation

## 2020-01-24 DIAGNOSIS — Z9889 Other specified postprocedural states: Secondary | ICD-10-CM | POA: Diagnosis not present

## 2020-01-24 DIAGNOSIS — Z8679 Personal history of other diseases of the circulatory system: Secondary | ICD-10-CM | POA: Insufficient documentation

## 2020-01-24 DIAGNOSIS — E785 Hyperlipidemia, unspecified: Secondary | ICD-10-CM | POA: Diagnosis not present

## 2020-01-24 DIAGNOSIS — I10 Essential (primary) hypertension: Secondary | ICD-10-CM | POA: Diagnosis not present

## 2020-01-24 NOTE — Telephone Encounter (Signed)
Patient informed and verbalized understanding

## 2020-01-24 NOTE — Telephone Encounter (Signed)
Pt called back returning your call °

## 2020-01-25 ENCOUNTER — Encounter: Payer: Self-pay | Admitting: Internal Medicine

## 2020-01-26 ENCOUNTER — Telehealth: Payer: Self-pay | Admitting: Internal Medicine

## 2020-01-26 NOTE — Telephone Encounter (Signed)
Spoke with Patient and his wife. States that they will make the medication change.   States that Patient had an artery that was 100% blocked. He had a procedure to open this up. They are wondering if this plus the medication change could be a cause of his symptoms? If so does he need the CT?

## 2020-01-26 NOTE — Telephone Encounter (Signed)
BP high with change to coreg Can he resume toprol 50 mg 2x per day?  If staggering and not feeling well does he want me to order CT head?  See message below    Keith Cruz, Keith Glow, MD BP at 6am was 182/87 rate 85, took Coreg then BP at 8:30 was 173/76 rate 72. I am lightheaded and staggering. Started doubled dose yesterday as instructed.       Coreg medication  Keith Cruz, Keith Cruz  You 27 minutes ago (8:53 AM)   BP at 6am was 182/87 rate 85, took Coreg then BP at 8:30 was 173/76 rate 72.  I am lightheaded and staggering.  Started doubled dose yesterday as instructed.   You  Keith Cruz, Keith Cruz 15 hours ago (6:11 PM)  TM Thank you  Take care   Keith Cruz, Keith Cruz  You 15 hours ago (6:10 PM)   Will do   You  Keith Cruz, Keith Cruz 15 hours ago (5:48 PM)  TM Yes please take 2 pills which equals 12.5 mg total starting now 2 pills 2x per day with food and check in with me tomorrow please   You should be off metoprolol     Keith Cruz, Keith Cruz  You 17 hours ago (4:19 PM)   Not due to take coteg until dinner. Should I take it now and double dosage?   Keith Cruz, Keith Cruz  You 17 hours ago (4:10 PM)   151/68, morning 146/62, yesterday 130/66  before taking coreg.  Pulse is usually 57 to 60.  I feel like my heart is racing.   You  Keith Cruz, Keith Cruz 17 hours ago (4:00 PM)  TM If your blood pressure and heart rate up can you take 2 pills this would be 12.5 mg total 2x per day of coreg? Instead of 6.25   What was your blood pressure?      Thressa Sheller, CMA  You 17 hours ago (3:23 PM)   Please advise    Routing comment   Keith Cruz, Keith Cruz  You 18 hours ago (2:54 PM)   New med was started yesterday, bp was up last night and this morning.  Today I am light headed and my heart rate is 81, which is really high for me.  The only thing that is different is the new med.  I don't think I will be able to take it.  I am not taking a third dose until hearing back from you.  Thankyou.

## 2020-01-27 ENCOUNTER — Encounter: Payer: Self-pay | Admitting: Internal Medicine

## 2020-01-30 ENCOUNTER — Other Ambulatory Visit: Payer: Self-pay | Admitting: Internal Medicine

## 2020-01-30 DIAGNOSIS — I10 Essential (primary) hypertension: Secondary | ICD-10-CM

## 2020-01-30 MED ORDER — METOPROLOL TARTRATE 50 MG PO TABS: 50.0000 mg | ORAL_TABLET | Freq: Two times a day (BID) | ORAL | 3 refills | Status: DC

## 2020-01-30 NOTE — Telephone Encounter (Signed)
Addressed see my chart messages   Dr. Gayland Curry

## 2020-01-31 ENCOUNTER — Other Ambulatory Visit: Payer: Self-pay | Admitting: Internal Medicine

## 2020-01-31 DIAGNOSIS — E785 Hyperlipidemia, unspecified: Secondary | ICD-10-CM

## 2020-01-31 MED ORDER — ATORVASTATIN CALCIUM 40 MG PO TABS: 40.0000 mg | ORAL_TABLET | Freq: Every day | ORAL | 3 refills | Status: DC

## 2020-02-08 DIAGNOSIS — I10 Essential (primary) hypertension: Secondary | ICD-10-CM | POA: Diagnosis not present

## 2020-02-08 DIAGNOSIS — N1831 Chronic kidney disease, stage 3a: Secondary | ICD-10-CM | POA: Diagnosis not present

## 2020-02-08 DIAGNOSIS — I701 Atherosclerosis of renal artery: Secondary | ICD-10-CM | POA: Diagnosis not present

## 2020-02-09 ENCOUNTER — Ambulatory Visit (INDEPENDENT_AMBULATORY_CARE_PROVIDER_SITE_OTHER): Payer: Medicare Other

## 2020-02-09 VITALS — BP 126/69 | HR 65 | Ht 76.0 in | Wt 220.0 lb

## 2020-02-09 DIAGNOSIS — Z0000 Encounter for general adult medical examination without abnormal findings: Secondary | ICD-10-CM | POA: Diagnosis not present

## 2020-02-09 NOTE — Progress Notes (Signed)
Subjective:   Keith Cruz is a 79 y.o. male who presents for Medicare Annual/Subsequent preventive examination.  Review of Systems    No ROS.  Medicare Wellness Virtual Visit.   Cardiac Risk Factors include: advanced age (>2men, >72 women);male gender;hypertension     Objective:    Today's Vitals   02/09/20 0916  BP: 126/69  Pulse: 65  SpO2: 95%  Weight: 220 lb (99.8 kg)  Height: 6\' 4"  (1.93 m)   Body mass index is 26.78 kg/m.  Advanced Directives 02/09/2020 07/17/2019 02/08/2019 01/19/2018 07/22/2017 06/24/2017 12/09/2016  Does Patient Have a Medical Advance Directive? Yes No Yes Yes Yes Yes Yes  Type of Paramedic of [place];Living will - [place];Living will Living will;Healthcare Power of [place];Living will [place];Living will [place];Living will  Does patient want to make changes to medical advance directive? No - Patient declined - No - Patient declined - - - -  Copy of [place] in Chart? No - copy requested - No - copy requested No - copy requested Yes No - copy requested No - copy requested    Current Medications (verified) Outpatient Encounter Medications as of 02/09/2020  Medication Sig  . ACCU-CHEK AVIVA PLUS test strip CHECK BLOOD SUGAR 1 X DAILY  . albuterol (VENTOLIN HFA) 108 (90 Base) MCG/ACT inhaler Inhale 1-2 puffs into the lungs every 4 (four) hours as needed for wheezing or shortness of breath.  Marland Kitchen apixaban (ELIQUIS) 5 MG TABS tablet Take 1 tablet (5 mg total) by mouth 2 (two) times daily.  Marland Kitchen aspirin EC 81 MG tablet Take 81 mg by mouth daily.  Marland Kitchen atorvastatin (LIPITOR) 40 MG tablet Take 1 tablet (40 mg total) by mouth daily. After 6 pm  . Cholecalciferol (VITAMIN D3) 2000 UNITS capsule Take 2,000 Units by mouth daily. am  . cyclobenzaprine (FLEXERIL) 5 MG tablet Take 1-2 tablets (5-10 mg total) by mouth at bedtime as needed  for muscle spasms.  . fexofenadine (ALLEGRA) 180 MG tablet Take 1 tablet (180 mg total) by mouth daily as needed for allergies or rhinitis.  . fluticasone (FLONASE) 50 MCG/ACT nasal spray Place 2 sprays into both nostrils daily.  . fluticasone (FLOVENT HFA) 110 MCG/ACT inhaler Inhale 2 puffs into the lungs 2 (two) times daily. Rinse your mouth  . folic acid (FOLVITE) 1 MG tablet Take 0.8 mg by mouth daily. am  . hydrALAZINE (APRESOLINE) 50 MG tablet Take 1 tablet (50 mg total) by mouth 3 (three) times daily.  . metoprolol tartrate (LOPRESSOR) 50 MG tablet Take 1 tablet (50 mg total) by mouth 2 (two) times daily. D/c coreg pt prefers metoprolol  . Multiple Vitamins-Minerals (CENTRUM SILVER 50+MEN PO) Take by mouth.  . pantoprazole (PROTONIX) 40 MG tablet Take 1 tablet (40 mg total) by mouth 2 (two) times daily before a meal. TRY TO TAKE ONLY 1X PER DAY INSTEAD OF 2 30 MIN BEFORE A MEAL   Facility-Administered Encounter Medications as of 02/09/2020  Medication  . ipratropium-albuterol (DUONEB) 0.5-2.5 (3) MG/3ML nebulizer solution 3 mL    Allergies (verified) Fexofenadine-pseudoephed er, Nifedipine, Allegra  [fexofenadine], Anoro ellipta [umeclidinium-vilanterol], and Prednisone   History: Past Medical History:  Diagnosis Date  . Arthritis    back  . Asthma   . Atherosclerosis of aorta ([place])   . Atherosclerosis of aorta ([place])   . Bronchitis 05/2017   chronic  . CAD (coronary artery disease)   .  Carotid artery disease ([place])   . Carotid stenosis   . Carotid stenosis, bilateral   . Chronic bronchitis ([place])   . Chronic cough   . COPD (chronic obstructive pulmonary disease) ([place])   . Dysrhythmia   . GERD (gastroesophageal reflux disease)   . Heart murmur   . HOH (hard of hearing)    hearing aids  . Hypercholesteremia   . Hypertension   . Kidney disease    stent  . Macular degeneration of left eye   . Myocardial infarction ([place]) 2016  . Pre-diabetes   . Seasonal allergies   .  Seizure ([place])   . Seizures (H[facility])    viral meningitis  . Stroke [facility])    1991 and imaging 05/12/15 old strokes  . TIA (transient ischemic attack) 1991   x2  . Uses hearing aid   . Viral meningitis 2017  . Viral meningitis   . Wears dentures    upper and lower   Past Surgical History:  Procedure Laterality Date  . CAROTID ENDARTERECTOMY Left 2014  . CAROTID ENDARTERECTOMY Right 1991  . CATARACT EXTRACTION W/PHACO Left 06/24/2017   Procedure: CATARACT EXTRACTION PHACO AND INTRAOCULAR LENS PLACEMENT ([place]) LEFT BORDERLINE DIABETIC;  Surgeon: Leandrew Koyanagi, MD;  Location: [place];  Service: Ophthalmology;  Laterality: Left;  . CATARACT EXTRACTION W/PHACO Right 07/22/2017   Procedure: CATARACT EXTRACTION PHACO AND INTRAOCULAR LENS PLACEMENT ([place]) RIGHT BORDERLINE DIABETIC;  Surgeon: Leandrew Koyanagi, MD;  Location: [place];  Service: Ophthalmology;  Laterality: Right;  . CORONARY ARTERY BYPASS GRAFT     x5  . SKIN LESION EXCISION Left    left ear  . URETERAL STENT PLACEMENT     Family History  Problem Relation Age of Onset  . Stroke Mother   . Heart attack Mother   . Dementia Mother   . Stroke Father   . Stroke Paternal Grandmother   . Stroke Paternal Grandfather   . Alzheimer's disease Paternal Grandfather   . Stroke Sister   . Alzheimer's disease Sister   . Dementia Sister   . Stroke Brother   . Alzheimer's disease Maternal Grandmother   . Dementia Maternal Grandmother   . Dementia Sister    Social History   Socioeconomic History  . Marital status: Married    Spouse name: Not on file  . Number of children: Not on file  . Years of education: Not on file  . Highest education level: 10th grade  Occupational History  . Not on file  Tobacco Use  . Smoking status: Former Smoker    Packs/day: 2.00    Years: 10.00    Pack years: 20.00    Types: Cigarettes    Quit date: 04/28/1970    Years since quitting: 49.8  . Smokeless tobacco: Former  Network engineer  . Vaping Use: Never used  Substance and Sexual Activity  . Alcohol use: No    Alcohol/week: 0.0 standard drinks    Comment: previous  . Drug use: No  . Sexual activity: Yes    Comment: wife   Other Topics Concern  . Not on file  Social History Narrative   Re-married x 3 years    Retired    Owns guns, wears seat belt, safe in relationship    2 sons and 1 daughter    Social Determinants of Radio broadcast assistant Strain: Low Risk   . Difficulty of Paying Living Expenses: Not hard at all  Food Insecurity: No  Food Insecurity  . Worried About Charity fundraiser in the Last Year: Never true  . Ran Out of Food in the Last Year: Never true  Transportation Needs: No Transportation Needs  . Lack of Transportation (Medical): No  . Lack of Transportation (Non-Medical): No  Physical Activity: Unknown  . Days of Exercise per Week: 0 days  . Minutes of Exercise per Session: Not on file  Stress: No Stress Concern Present  . Feeling of Stress : Not at all  Social Connections:   . Frequency of Communication with Friends and Family: Not on file  . Frequency of Social Gatherings with Friends and Family: Not on file  . Attends Religious Services: Not on file  . Active Member of Clubs or Organizations: Not on file  . Attends Archivist Meetings: Not on file  . Marital Status: Not on file    Tobacco Counseling Counseling given: Not Answered   Clinical Intake:  Pre-visit preparation completed: Yes        Diabetes: No  How often do you need to have someone help you when you read instructions, pamphlets, or other written materials from your doctor or pharmacy?: 1 - Never  Interpreter Needed?: No      Activities of Daily Living In your present state of health, do you have any difficulty performing the following activities: 02/09/2020  Hearing? Y  Vision? N  Difficulty concentrating or making decisions? N  Walking or climbing stairs? N    Dressing or bathing? N  Doing errands, shopping? N  Preparing Food and eating ? N  Using the Toilet? N  In the past six months, have you accidently leaked urine? N  Do you have problems with loss of bowel control? N  Managing your Medications? N  Managing your Finances? N  Housekeeping or managing your Housekeeping? N  Some recent data might be hidden    Patient Care Team: McLean-Scocuzza, Nino Glow, MD as PCP - General (Internal Medicine)  Indicate any recent Medical Services you may have received from other than [facility] providers in the past year (date may be approximate).     Assessment:   This is a routine wellness examination for Cai.  I connected with Oswaldo today by telephone and verified that I am speaking with the correct person using two identifiers. Location patient: home Location provider: work Persons participating in the virtual visit: patient, Marine scientist.    I discussed the limitations, risks, security and privacy concerns of performing an evaluation and management service by telephone and the availability of in person appointments. The patient expressed understanding and verbally consented to this telephonic visit.    Interactive audio and video telecommunications were attempted between this provider and patient, however failed, due to patient having technical difficulties OR patient did not have access to video capability.  We continued and completed visit with audio only.  Some vital signs may be absent or patient reported.   Hearing/Vision screen  Hearing Screening   125Hz  250Hz  500Hz  1000Hz  2000Hz  3000Hz  4000Hz  6000Hz  8000Hz   Right ear:           Left ear:           Comments: Hearing aids  Vision Screening Comments: Wears corrective lenses  Visual acuity not assessed, virtual visit. They have seen their ophthalmologist in the last 12 months. Macular degeneration   Dietary issues and exercise activities discussed: Current Exercise Habits: Home exercise  routine, Type of exercise: walking, Intensity: Mild  Healthy diet  Good water intake  Goals    . Follow up with Primary Care Provider     As needed      Depression Screen PHQ 2/9 Scores 02/09/2020 09/02/2019 06/01/2019 02/08/2019 01/20/2019 03/09/2018 01/19/2018  PHQ - 2 Score 0 0 0 0 0 0 0  PHQ- 9 Score - - 0 - - - -    Fall Risk Fall Risk  02/09/2020 09/02/2019 06/01/2019 02/08/2019 01/20/2019  Falls in the past year? 0 0 0 0 0  Number falls in past yr: 0 0 0 - -  Injury with Fall? - 0 0 - -  Follow up Falls evaluation completed Falls evaluation completed Falls evaluation completed - -   Handrails in use when climbing stairs? Yes Home free of loose throw rugs in walkways, pet beds, electrical cords, etc? Yes  Adequate lighting in your home to reduce risk of falls? Yes   ASSISTIVE DEVICES UTILIZED TO PREVENT FALLS: Use of a cane, walker or w/c? No   TIMED UP AND GO: Was the test performed? No . Virtual visit.   Cognitive Function:  Patient is alert and oriented x3.  Denies difficulty focusing, making decisions, memory loss.  Enjoys playing solitaire and other brain health activities.    6CIT Screen 02/08/2019 01/19/2018 12/09/2016  What Year? 0 points 0 points 0 points  What month? 0 points 0 points 0 points  What time? 0 points 0 points 0 points  Count back from 20 0 points 0 points 0 points  Months in reverse 0 points 0 points 0 points  Repeat phrase 0 points 2 points 2 points  Total Score 0 2 2    Immunizations Immunization History  Administered Date(s) Administered  . Influenza, High Dose Seasonal PF 01/22/2015, 12/31/2017, 12/15/2018, 12/15/2019  . Influenza-Unspecified 02/23/2015, 01/03/2017, 12/31/2017, 12/15/2018  . PFIZER SARS-COV-2 Vaccination 06/27/2019, 07/18/2019  . Pneumococcal Conjugate-13 12/10/2015  . Pneumococcal Polysaccharide-23 05/11/2018  . Tdap 07/05/2018   Health Maintenance Health Maintenance  Topic Date Due  . Hepatitis C Screening  Never done   . TETANUS/TDAP  07/04/2028  . INFLUENZA VACCINE  Completed  . COVID-19 Vaccine  Completed  . PNA vac Low Risk Adult  Completed   Dental Screening: Recommended annual dental exams for proper oral hygiene  Community Resource Referral / Chronic Care Management: CRR required this visit?  No   CCM required this visit?  No      Plan:   Keep all routine maintenance appointments.   I have personally reviewed and noted the following in the patient's chart:   . Medical and social history . Use of alcohol, tobacco or illicit drugs  . Current medications and supplements . Functional ability and status . Nutritional status . Physical activity . Advanced directives . List of other physicians . Hospitalizations, surgeries, and ER visits in previous 12 months . Vitals . Screenings to include cognitive, depression, and falls . Referrals and appointments  In addition, I have reviewed and discussed with patient certain preventive protocols, quality metrics, and best practice recommendations. A written personalized care plan for preventive services as well as general preventive health recommendations were provided to patient via mychart.     Varney Biles, LPN   [id number]

## 2020-02-09 NOTE — Patient Instructions (Addendum)
Keith Cruz , Thank you for taking time to come for your Medicare Wellness Visit. I appreciate your ongoing commitment to your health goals. Please review the following plan we discussed and let me know if I can assist you in the future.   These are the goals we discussed: Goals    . Follow up with Primary Care Provider     As needed       This is a list of the screening recommended for you and due dates:  Health Maintenance  Topic Date Due  .  Hepatitis C: One time screening is recommended by Center for Disease Control  (CDC) for  adults born from [birth year] through 1965.   Never done  . Tetanus Vaccine  07/04/2028  . Flu Shot  Completed  . COVID-19 Vaccine  Completed  . Pneumonia vaccines  Completed    Immunizations Immunization History  Administered Date(s) Administered  . Influenza, High Dose Seasonal PF 01/22/2015, 12/31/2017, 12/15/2018, 12/15/2019  . Influenza-Unspecified 02/23/2015, 01/03/2017, 12/31/2017, 12/15/2018  . PFIZER SARS-COV-2 Vaccination 06/27/2019, 07/18/2019  . Pneumococcal Conjugate-13 12/10/2015  . Pneumococcal Polysaccharide-23 05/11/2018  . Tdap 07/05/2018   Advanced directives: End of life planning; Advance aging; Advanced directives discussed.  Copy of current HCPOA/Living Will requested.    Conditions/risks identified: none new  Follow up in one year for your annual wellness visit.   Preventive Care 34 Years and Older, Male Preventive care refers to lifestyle choices and visits with your health care provider that can promote health and wellness. What does preventive care include?  A yearly physical exam. This is also called an annual well check.  Dental exams once or twice a year.  Routine eye exams. Ask your health care provider how often you should have your eyes checked.  Personal lifestyle choices, including:  Daily care of your teeth and gums.  Regular physical activity.  Eating a healthy diet.  Avoiding tobacco and drug  use.  Limiting alcohol use.  Practicing safe sex.  Taking low doses of aspirin every day.  Taking vitamin and mineral supplements as recommended by your health care provider. What happens during an annual well check? The services and screenings done by your health care provider during your annual well check will depend on your age, overall health, lifestyle risk factors, and family history of disease. Counseling  Your health care provider may ask you questions about your:  Alcohol use.  Tobacco use.  Drug use.  Emotional well-being.  Home and relationship well-being.  Sexual activity.  Eating habits.  History of falls.  Memory and ability to understand (cognition).  Work and work Statistician. Screening  You may have the following tests or measurements:  Height, weight, and BMI.  Blood pressure.  Lipid and cholesterol levels. These may be checked every 5 years, or more frequently if you are over 23 years old.  Skin check.  Lung cancer screening. You may have this screening every year starting at age 50 if you have a 30-pack-year history of smoking and currently smoke or have quit within the past 15 years.  Fecal occult blood test (FOBT) of the stool. You may have this test every year starting at age 76.  Flexible sigmoidoscopy or colonoscopy. You may have a sigmoidoscopy every 5 years or a colonoscopy every 10 years starting at age 38.  Prostate cancer screening. Recommendations will vary depending on your family history and other risks.  Hepatitis C blood test.  Hepatitis B blood test.  Sexually transmitted disease (  STD) testing.  Diabetes screening. This is done by checking your blood sugar (glucose) after you have not eaten for a while (fasting). You may have this done every 1-3 years.  Abdominal aortic aneurysm (AAA) screening. You may need this if you are a current or former smoker.  Osteoporosis. You may be screened starting at age 108 if you are at  high risk. Talk with your health care provider about your test results, treatment options, and if necessary, the need for more tests. Vaccines  Your health care provider may recommend certain vaccines, such as:  Influenza vaccine. This is recommended every year.  Tetanus, diphtheria, and acellular pertussis (Tdap, Td) vaccine. You may need a Td booster every 10 years.  Zoster vaccine. You may need this after age 36.  Pneumococcal 13-valent conjugate (PCV13) vaccine. One dose is recommended after age 80.  Pneumococcal polysaccharide (PPSV23) vaccine. One dose is recommended after age 69. Talk to your health care provider about which screenings and vaccines you need and how often you need them. This information is not intended to replace advice given to you by your health care provider. Make sure you discuss any questions you have with your health care provider. Document Released: 05/11/2015 Document Revised: 01/02/2016 Document Reviewed: 02/13/2015 Elsevier Interactive Patient Education  2017 [place] Prevention in the Home Falls can cause injuries. They can happen to people of all ages. There are many things you can do to make your home safe and to help prevent falls. What can I do on the outside of my home?  Regularly fix the edges of walkways and driveways and fix any cracks.  Remove anything that might make you trip as you walk through a door, such as a raised step or threshold.  Trim any bushes or trees on the path to your home.  Use bright outdoor lighting.  Clear any walking paths of anything that might make someone trip, such as rocks or tools.  Regularly check to see if handrails are loose or broken. Make sure that both sides of any steps have handrails.  Any raised decks and porches should have guardrails on the edges.  Have any leaves, snow, or ice cleared regularly.  Use sand or salt on walking paths during winter.  Clean up any spills in your garage  right away. This includes oil or grease spills. What can I do in the bathroom?  Use night lights.  Install grab bars by the toilet and in the tub and shower. Do not use towel bars as grab bars.  Use non-skid mats or decals in the tub or shower.  If you need to sit down in the shower, use a plastic, non-slip stool.  Keep the floor dry. Clean up any water that spills on the floor as soon as it happens.  Remove soap buildup in the tub or shower regularly.  Attach bath mats securely with double-sided non-slip rug tape.  Do not have throw rugs and other things on the floor that can make you trip. What can I do in the bedroom?  Use night lights.  Make sure that you have a light by your bed that is easy to reach.  Do not use any sheets or blankets that are too big for your bed. They should not hang down onto the floor.  Have a firm chair that has side arms. You can use this for support while you get dressed.  Do not have throw rugs and other things on the  floor that can make you trip. What can I do in the kitchen?  Clean up any spills right away.  Avoid walking on wet floors.  Keep items that you use a lot in easy-to-reach places.  If you need to reach something above you, use a strong step stool that has a grab bar.  Keep electrical cords out of the way.  Do not use floor polish or wax that makes floors slippery. If you must use wax, use non-skid floor wax.  Do not have throw rugs and other things on the floor that can make you trip. What can I do with my stairs?  Do not leave any items on the stairs.  Make sure that there are handrails on both sides of the stairs and use them. Fix handrails that are broken or loose. Make sure that handrails are as long as the stairways.  Check any carpeting to make sure that it is firmly attached to the stairs. Fix any carpet that is loose or worn.  Avoid having throw rugs at the top or bottom of the stairs. If you do have throw rugs,  attach them to the floor with carpet tape.  Make sure that you have a light switch at the top of the stairs and the bottom of the stairs. If you do not have them, ask someone to add them for you. What else can I do to help prevent falls?  Wear shoes that:  Do not have high heels.  Have rubber bottoms.  Are comfortable and fit you well.  Are closed at the toe. Do not wear sandals.  If you use a stepladder:  Make sure that it is fully opened. Do not climb a closed stepladder.  Make sure that both sides of the stepladder are locked into place.  Ask someone to hold it for you, if possible.  Clearly mark and make sure that you can see:  Any grab bars or handrails.  First and last steps.  Where the edge of each step is.  Use tools that help you move around (mobility aids) if they are needed. These include:  Canes.  Walkers.  Scooters.  Crutches.  Turn on the lights when you go into a dark area. Replace any light bulbs as soon as they burn out.  Set up your furniture so you have a clear path. Avoid moving your furniture around.  If any of your floors are uneven, fix them.  If there are any pets around you, be aware of where they are.  Review your medicines with your doctor. Some medicines can make you feel dizzy. This can increase your chance of falling. Ask your doctor what other things that you can do to help prevent falls. This information is not intended to replace advice given to you by your health care provider. Make sure you discuss any questions you have with your health care provider. Document Released: 02/08/2009 Document Revised: 09/20/2015 Document Reviewed: 05/19/2014 Elsevier Interactive Patient Education  2017 [employer].

## 2020-02-14 ENCOUNTER — Encounter (INDEPENDENT_AMBULATORY_CARE_PROVIDER_SITE_OTHER): Payer: Self-pay | Admitting: Vascular Surgery

## 2020-02-14 ENCOUNTER — Other Ambulatory Visit: Payer: Self-pay

## 2020-02-14 ENCOUNTER — Ambulatory Visit (INDEPENDENT_AMBULATORY_CARE_PROVIDER_SITE_OTHER): Payer: Medicare Other | Admitting: Vascular Surgery

## 2020-02-14 VITALS — BP 138/62 | HR 69 | Ht 76.0 in | Wt 218.0 lb

## 2020-02-14 DIAGNOSIS — I480 Paroxysmal atrial fibrillation: Secondary | ICD-10-CM | POA: Diagnosis not present

## 2020-02-14 DIAGNOSIS — I6523 Occlusion and stenosis of bilateral carotid arteries: Secondary | ICD-10-CM

## 2020-02-14 DIAGNOSIS — I10 Essential (primary) hypertension: Secondary | ICD-10-CM

## 2020-02-14 DIAGNOSIS — I701 Atherosclerosis of renal artery: Secondary | ICD-10-CM

## 2020-02-14 MED ORDER — HYDRALAZINE HCL 50 MG PO TABS: 50.0000 mg | ORAL_TABLET | Freq: Three times a day (TID) | ORAL | 3 refills | Status: DC

## 2020-02-14 NOTE — Progress Notes (Signed)
MRN : [id number]  Keith Cruz is a 79 y.o. ([birth year]) male who presents with chief complaint of  Chief Complaint  Patient presents with  . Follow-up    6 week no studies  .  History of Present Illness: Patient returns today in follow up of his carotid disease.  He is now couple months status post right carotid endarterectomy done at [facility].  This was a redo endarterectomy it was extremely difficult but his neck has healed.  It drained for several weeks but is no longer draining.  No fevers or chills.  No new neurologic symptoms.  Our carotid duplex done last month showed this to have only mildly elevated velocities in the endarterectomy site.  The left carotid which is also had a previous endarterectomy many years ago showed velocities only in the 40 to 59% stenosis range.  He says at [facility] they had discussed fixing the left side as well, but by this study it would not appear to be necessary at this point.  Current Outpatient Medications  Medication Sig Dispense Refill  . ACCU-CHEK AVIVA PLUS test strip CHECK BLOOD SUGAR 1 X DAILY 100 strip 5  . albuterol (VENTOLIN HFA) 108 (90 Base) MCG/ACT inhaler Inhale 1-2 puffs into the lungs every 4 (four) hours as needed for wheezing or shortness of breath. 1 Inhaler 12  . apixaban (ELIQUIS) 5 MG TABS tablet Take 1 tablet (5 mg total) by mouth 2 (two) times daily. 60 tablet 5  . aspirin EC 81 MG tablet Take 81 mg by mouth daily.    Marland Kitchen atorvastatin (LIPITOR) 40 MG tablet Take 1 tablet (40 mg total) by mouth daily. After 6 pm 90 tablet 3  . benzonatate (TESSALON) 100 MG capsule benzonatate 100 mg capsule  TAKE 1 CAPSULE (100 MG TOTAL) BY MOUTH 3 (THREE) TIMES DAILY AS NEEDED FOR COUGH FOR UP TO 7 DAYS    . Cholecalciferol (VITAMIN D3) 2000 UNITS capsule Take 2,000 Units by mouth daily. am    . cyclobenzaprine (FLEXERIL) 5 MG tablet Take 1-2 tablets (5-10 mg total) by mouth at bedtime as needed for muscle spasms. 60 tablet 2  . Cyclobenzaprine HCl  (CYCLOBENZAPRINE 20 TD) cyclobenzaprine    . fexofenadine (ALLEGRA) 180 MG tablet Take 1 tablet (180 mg total) by mouth daily as needed for allergies or rhinitis. 90 tablet 3  . fluticasone (FLONASE) 50 MCG/ACT nasal spray Place 2 sprays into both nostrils daily. 16 g 0  . fluticasone (FLOVENT HFA) 110 MCG/ACT inhaler Inhale 2 puffs into the lungs 2 (two) times daily. Rinse your mouth 1 Inhaler 12  . folic acid (FOLVITE) 1 MG tablet Take 0.8 mg by mouth daily. am    . lidocaine (LIDODERM) 5 % lidocaine 5 % topical patch  APPLY 1 PATCH BY TOPICAL ROUTE ONCE DAILY (MAY WEAR UP TO 12HOURS.)    . metoprolol tartrate (LOPRESSOR) 50 MG tablet Take 1 tablet (50 mg total) by mouth 2 (two) times daily. D/c coreg pt prefers metoprolol 180 tablet 3  . Multiple Vitamins-Minerals (CENTRUM SILVER 50+MEN PO) Take by mouth.    . pantoprazole (PROTONIX) 40 MG tablet Take 1 tablet (40 mg total) by mouth 2 (two) times daily before a meal. TRY TO TAKE ONLY 1X PER DAY INSTEAD OF 2 30 MIN BEFORE A MEAL 180 tablet 3  . hydrALAZINE (APRESOLINE) 50 MG tablet Take 1 tablet (50 mg total) by mouth 3 (three) times daily. 270 tablet 3  . traZODone (DESYREL) 25 mg  TABS tablet trazodone     Current Facility-Administered Medications  Medication Dose Route Frequency Provider Last Rate Last Admin  . ipratropium-albuterol (DUONEB) 0.5-2.5 (3) MG/3ML nebulizer solution 3 mL  3 mL Nebulization Once Olin Hauser, DO        Past Medical History:  Diagnosis Date  . Arthritis    back  . Asthma   . Atherosclerosis of aorta ([place])   . Atherosclerosis of aorta ([place])   . Bronchitis 05/2017   chronic  . CAD (coronary artery disease)   . Carotid artery disease ([place])   . Carotid stenosis   . Carotid stenosis, bilateral   . Chronic bronchitis ([place])   . Chronic cough   . COPD (chronic obstructive pulmonary disease) ([place])   . Dysrhythmia   . GERD (gastroesophageal reflux disease)   . Heart murmur   . HOH (hard of  hearing)    hearing aids  . Hypercholesteremia   . Hypertension   . Kidney disease    stent  . Macular degeneration of left eye   . Myocardial infarction ([place]) 2016  . Pre-diabetes   . Seasonal allergies   . Seizure ([place])   . Seizures (HCC)    viral meningitis  . Stroke [facility])    1991 and imaging 05/12/15 old strokes  . TIA (transient ischemic attack) 1991   x2  . Uses hearing aid   . Viral meningitis 2017  . Viral meningitis   . Wears dentures    upper and lower    Past Surgical History:  Procedure Laterality Date  . CAROTID ENDARTERECTOMY Left 2014  . CAROTID ENDARTERECTOMY Right 1991  . CATARACT EXTRACTION W/PHACO Left 06/24/2017   Procedure: CATARACT EXTRACTION PHACO AND INTRAOCULAR LENS PLACEMENT ([place]) LEFT BORDERLINE DIABETIC;  Surgeon: Leandrew Koyanagi, MD;  Location: [place];  Service: Ophthalmology;  Laterality: Left;  . CATARACT EXTRACTION W/PHACO Right 07/22/2017   Procedure: CATARACT EXTRACTION PHACO AND INTRAOCULAR LENS PLACEMENT ([place]) RIGHT BORDERLINE DIABETIC;  Surgeon: Leandrew Koyanagi, MD;  Location: [place];  Service: Ophthalmology;  Laterality: Right;  . CORONARY ARTERY BYPASS GRAFT     x5  . SKIN LESION EXCISION Left    left ear  . URETERAL STENT PLACEMENT       Social History   Tobacco Use  . Smoking status: Former Smoker    Packs/day: 2.00    Years: 10.00    Pack years: 20.00    Types: Cigarettes    Quit date: 04/28/1970    Years since quitting: 49.8  . Smokeless tobacco: Former Network engineer  . Vaping Use: Never used  Substance Use Topics  . Alcohol use: No    Alcohol/week: 0.0 standard drinks    Comment: previous  . Drug use: No      Family History  Problem Relation Age of Onset  . Stroke Mother   . Heart attack Mother   . Dementia Mother   . Stroke Father   . Stroke Paternal Grandmother   . Stroke Paternal Grandfather   . Alzheimer's disease Paternal Grandfather   . Stroke Sister   .  Alzheimer's disease Sister   . Dementia Sister   . Stroke Brother   . Alzheimer's disease Maternal Grandmother   . Dementia Maternal Grandmother   . Dementia Sister     Allergies  Allergen Reactions  . Fexofenadine-Pseudoephed Er Swelling    Tongue and face. Only with D product. Tolerates plain allegra at home.  Marland Kitchen   Marland Kitchen  Nifedipine Swelling    Tongue and face    . Pseudoephedrine Swelling  . Allegra  [Fexofenadine] Swelling    Tongue and face. Only with D product. Tolerates plain allegra at home.   Jearl Klinefelter Ellipta [Umeclidinium-Vilanterol]   . Carvedilol Other (See Comments)    Light headed, heart racing, increase in blood pressure   . Prednisone Other (See Comments)    Oral prednisone. Confusion. Mental status changes   REVIEW OF SYSTEMS(Negative unless checked)  Constitutional: [] ??Weight loss [] ??Fever [] ??Chills Cardiac: [] ??Chest pain [] ??Chest pressure [x] ??Palpitations [] ??Shortness of breath when laying flat [] ??Shortness of breath at rest [x] ??Shortness of breath with exertion. Vascular: [] ??Pain in legs with walking [] ??Pain in legs at rest [] ??Pain in legs when laying flat [] ??Claudication [] ??Pain in feet when walking [] ??Pain in feet at rest [] ??Pain in feet when laying flat [] ??History of DVT [] ??Phlebitis [] ??Swelling in legs [] ??Varicose veins [] ??Non-healing ulcers Pulmonary: [] ??Uses home oxygen [] ??Productive cough [] ??Hemoptysis [] ??Wheeze [] ??COPD [] ??Asthma Neurologic: [] ??Dizziness [] ??Blackouts [] ??Seizures [] ??History of stroke [x] ??History of TIA [] ??Aphasia [] ??Temporary blindness [] ??Dysphagia [] ??Weakness or numbness in arms [] ??Weakness or numbness in legs Musculoskeletal: [] ??Arthritis [] ??Joint swelling [] ??Joint pain [] ??Low back pain Hematologic: [] ??Easy bruising [] ??Easy bleeding [] ??Hypercoagulable state [] ??Anemic  Gastrointestinal: [] ??Blood in stool [] ??Vomiting  blood [] ??Gastroesophageal reflux/heartburn [] ??Abdominal pain Genitourinary: [x] ??Chronic kidney disease [] ??Difficult urination [] ??Frequent urination [] ??Burning with urination [] ??Hematuria Skin: [] ??Rashes [] ??Ulcers [] ??Wounds Psychological: [] ??History of anxiety [] ??History of major depression.   Physical Examination  BP 138/62   Pulse 69   Ht 6\' 4"  (1.93 m)   Wt 218 lb (98.9 kg)   BMI 26.54 kg/m  Gen:  WD/WN, NAD Head: [place]/AT, No temporalis wasting. Ear/Nose/Throat: Hearing grossly intact, nares w/o erythema or drainage Eyes: Conjunctiva clear. Sclera non-icteric Neck: Supple.  Trachea midline Pulmonary:  Good air movement, no use of accessory muscles.  Cardiac: Somewhat irregular Vascular:  Vessel Right Left  Radial Palpable Palpable               Musculoskeletal: M/S 5/5 throughout.  No deformity or atrophy.  Mild lower extremity edema. Neurologic: Sensation grossly intact in extremities.  Symmetrical.  Speech is fluent.  Psychiatric: Judgment intact, Mood & affect appropriate for pt's clinical situation. Dermatologic: No rashes or ulcers noted.  No cellulitis or open wounds.       Labs No results found for this or any previous visit (from the past 2160 hour(s)).  Radiology No results found.  Assessment/Plan Benign essential HTN blood pressure control important in reducing the progression of atherosclerotic disease. On appropriate oral medications.   Atrial fibrillation ([place]) On anticoagulation  Renal artery stenosis ([place]) To be checked early next year.  Bilateral carotid artery stenosis Our carotid duplex done last month showed this to have only mildly elevated velocities in the endarterectomy site.  The left carotid which is also had a previous endarterectomy many years ago showed velocities only in the 40 to 59% stenosis range.  He says at [facility] they had discussed fixing the left side as well, but by this study it would not  appear to be necessary at this point.  He also had some intracranial disease, but I would not recommend any intervention for that without clear focal recent symptoms on that left side.  For now, we are going to plan a 73-month follow-up with a carotid duplex.  He will continue his current medical regimen.    Leotis Pain, MD  02/14/2020 5:01 PM    This note was created with Dragon medical transcription system.  Any  errors from dictation are purely unintentional

## 2020-02-22 DIAGNOSIS — R918 Other nonspecific abnormal finding of lung field: Secondary | ICD-10-CM | POA: Diagnosis not present

## 2020-02-22 DIAGNOSIS — M47814 Spondylosis without myelopathy or radiculopathy, thoracic region: Secondary | ICD-10-CM | POA: Diagnosis not present

## 2020-02-22 DIAGNOSIS — J929 Pleural plaque without asbestos: Secondary | ICD-10-CM | POA: Diagnosis not present

## 2020-02-22 DIAGNOSIS — Z951 Presence of aortocoronary bypass graft: Secondary | ICD-10-CM | POA: Diagnosis not present

## 2020-02-22 DIAGNOSIS — J439 Emphysema, unspecified: Secondary | ICD-10-CM | POA: Diagnosis not present

## 2020-02-28 ENCOUNTER — Other Ambulatory Visit (INDEPENDENT_AMBULATORY_CARE_PROVIDER_SITE_OTHER): Payer: Self-pay | Admitting: Nurse Practitioner

## 2020-03-30 ENCOUNTER — Other Ambulatory Visit: Payer: Self-pay | Admitting: Family Medicine

## 2020-03-30 DIAGNOSIS — R7303 Prediabetes: Secondary | ICD-10-CM

## 2020-04-12 DIAGNOSIS — R7309 Other abnormal glucose: Secondary | ICD-10-CM | POA: Insufficient documentation

## 2020-04-12 DIAGNOSIS — I129 Hypertensive chronic kidney disease with stage 1 through stage 4 chronic kidney disease, or unspecified chronic kidney disease: Secondary | ICD-10-CM | POA: Insufficient documentation

## 2020-04-12 DIAGNOSIS — Z23 Encounter for immunization: Secondary | ICD-10-CM | POA: Insufficient documentation

## 2020-04-12 DIAGNOSIS — E785 Hyperlipidemia, unspecified: Secondary | ICD-10-CM | POA: Diagnosis not present

## 2020-04-12 DIAGNOSIS — F4320 Adjustment disorder, unspecified: Secondary | ICD-10-CM | POA: Insufficient documentation

## 2020-04-12 DIAGNOSIS — I10 Essential (primary) hypertension: Secondary | ICD-10-CM | POA: Diagnosis not present

## 2020-04-12 DIAGNOSIS — I2510 Atherosclerotic heart disease of native coronary artery without angina pectoris: Secondary | ICD-10-CM | POA: Diagnosis not present

## 2020-04-12 DIAGNOSIS — R21 Rash and other nonspecific skin eruption: Secondary | ICD-10-CM | POA: Insufficient documentation

## 2020-04-12 DIAGNOSIS — Z9181 History of falling: Secondary | ICD-10-CM | POA: Insufficient documentation

## 2020-04-12 DIAGNOSIS — Z1331 Encounter for screening for depression: Secondary | ICD-10-CM | POA: Insufficient documentation

## 2020-04-12 DIAGNOSIS — N289 Disorder of kidney and ureter, unspecified: Secondary | ICD-10-CM | POA: Insufficient documentation

## 2020-04-12 DIAGNOSIS — I6523 Occlusion and stenosis of bilateral carotid arteries: Secondary | ICD-10-CM | POA: Diagnosis not present

## 2020-05-01 ENCOUNTER — Other Ambulatory Visit: Payer: Self-pay | Admitting: Internal Medicine

## 2020-05-01 ENCOUNTER — Other Ambulatory Visit: Payer: Self-pay | Admitting: Family Medicine

## 2020-05-01 ENCOUNTER — Encounter: Payer: Self-pay | Admitting: Internal Medicine

## 2020-05-01 DIAGNOSIS — R739 Hyperglycemia, unspecified: Secondary | ICD-10-CM

## 2020-05-01 DIAGNOSIS — R7303 Prediabetes: Secondary | ICD-10-CM

## 2020-05-01 MED ORDER — GLUCOSE BLOOD VI STRP: ORAL_STRIP | 3 refills | Status: DC

## 2020-05-01 NOTE — Telephone Encounter (Signed)
Routing to [facility] 

## 2020-05-11 ENCOUNTER — Encounter (INDEPENDENT_AMBULATORY_CARE_PROVIDER_SITE_OTHER): Payer: Self-pay | Admitting: Vascular Surgery

## 2020-05-11 ENCOUNTER — Ambulatory Visit (INDEPENDENT_AMBULATORY_CARE_PROVIDER_SITE_OTHER): Payer: Medicare Other

## 2020-05-11 ENCOUNTER — Other Ambulatory Visit: Payer: Self-pay

## 2020-05-11 ENCOUNTER — Ambulatory Visit (INDEPENDENT_AMBULATORY_CARE_PROVIDER_SITE_OTHER): Payer: Medicare Other | Admitting: Vascular Surgery

## 2020-05-11 VITALS — BP 137/71 | HR 68 | Ht 76.0 in | Wt 216.0 lb

## 2020-05-11 DIAGNOSIS — I10 Essential (primary) hypertension: Secondary | ICD-10-CM | POA: Diagnosis not present

## 2020-05-11 DIAGNOSIS — I6523 Occlusion and stenosis of bilateral carotid arteries: Secondary | ICD-10-CM

## 2020-05-11 DIAGNOSIS — I701 Atherosclerosis of renal artery: Secondary | ICD-10-CM | POA: Diagnosis not present

## 2020-05-11 DIAGNOSIS — R7303 Prediabetes: Secondary | ICD-10-CM

## 2020-05-11 DIAGNOSIS — E785 Hyperlipidemia, unspecified: Secondary | ICD-10-CM

## 2020-05-11 DIAGNOSIS — I480 Paroxysmal atrial fibrillation: Secondary | ICD-10-CM | POA: Diagnosis not present

## 2020-05-11 NOTE — Assessment & Plan Note (Signed)
lipid control important in reducing the progression of atherosclerotic disease. Continue statin therapy  

## 2020-05-11 NOTE — Progress Notes (Signed)
MRN : [id number]  Keith Cruz is a 80 y.o. (1941-04-04) male who presents with chief complaint of  Chief Complaint  Patient presents with  . Follow-up    U/S   .  History of Present Illness: Patient returns today in follow up of his carotid disease.  Since his last visit, he has not had any focal neurologic symptoms. Specifically, the patient denies amaurosis fugax, speech or swallowing difficulties, or arm or leg weakness or numbness.  About 6 months ago, he underwent redo right carotid endarterectomy for high-grade symptomatic stenosis at another institution.  There is been concern for recurrent left carotid disease as well.  His duplex a few months ago showed only mildly elevated velocities in the moderate range in the left carotid artery.  His duplex today does show more elevated velocities with velocities are greater than 200 peak systolic velocity in the common carotid artery today.  The right carotid endarterectomy site is widely patent.  Current Outpatient Medications  Medication Sig Dispense Refill  . ACCU-CHEK AVIVA PLUS test strip USE TO CHECK BLOOD SUGAR ONCE DAILY (ICD-10: R73.03) 100 strip 5  . albuterol (VENTOLIN HFA) 108 (90 Base) MCG/ACT inhaler Inhale 1-2 puffs into the lungs every 4 (four) hours as needed for wheezing or shortness of breath. 1 Inhaler 12  . aspirin EC 81 MG tablet Take 81 mg by mouth daily.    . benzonatate (TESSALON) 100 MG capsule benzonatate 100 mg capsule  TAKE 1 CAPSULE (100 MG TOTAL) BY MOUTH 3 (THREE) TIMES DAILY AS NEEDED FOR COUGH FOR UP TO 7 DAYS    . Cholecalciferol (VITAMIN D3) 2000 UNITS capsule Take 2,000 Units by mouth daily. am    . cyclobenzaprine (FLEXERIL) 5 MG tablet Take 1-2 tablets (5-10 mg total) by mouth at bedtime as needed for muscle spasms. 60 tablet 2  . ELIQUIS 5 MG TABS tablet TAKE 1 TABLET BY MOUTH TWICE A DAY 60 tablet 5  . fexofenadine (ALLEGRA) 180 MG tablet Take 1 tablet (180 mg total) by mouth daily as needed for  allergies or rhinitis. 90 tablet 3  . fluticasone (FLOVENT HFA) 110 MCG/ACT inhaler Inhale 2 puffs into the lungs 2 (two) times daily. Rinse your mouth 1 Inhaler 12  . folic acid (FOLVITE) 1 MG tablet Take 0.8 mg by mouth daily. am    . hydrALAZINE (APRESOLINE) 50 MG tablet Take 1 tablet (50 mg total) by mouth 3 (three) times daily. 270 tablet 3  . metoprolol tartrate (LOPRESSOR) 50 MG tablet Take 1 tablet (50 mg total) by mouth 2 (two) times daily. D/c coreg pt prefers metoprolol 180 tablet 3  . Multiple Vitamins-Minerals (CENTRUM SILVER 50+MEN PO) Take by mouth.    . pantoprazole (PROTONIX) 40 MG tablet Take 1 tablet (40 mg total) by mouth 2 (two) times daily before a meal. TRY TO TAKE ONLY 1X PER DAY INSTEAD OF 2 30 MIN BEFORE A MEAL 180 tablet 3  . atorvastatin (LIPITOR) 80 MG tablet Take 80 mg by mouth daily.    . Cyclobenzaprine HCl (CYCLOBENZAPRINE 20 TD) cyclobenzaprine    . fluticasone (FLONASE) 50 MCG/ACT nasal spray Place 2 sprays into both nostrils daily. 16 g 0  . glucose blood test strip E11.9 bid accuchek aviva plus Use as instructed 200 each 3  . hydrALAZINE (APRESOLINE) 25 MG tablet Take 25 mg by mouth 3 (three) times daily.    Marland Kitchen lidocaine (LIDODERM) 5 % lidocaine 5 % topical patch  APPLY 1 PATCH BY  TOPICAL ROUTE ONCE DAILY (MAY WEAR UP TO 12HOURS.)    . traZODone (DESYREL) 25 mg TABS tablet trazodone     Current Facility-Administered Medications  Medication Dose Route Frequency Provider Last Rate Last Admin  . ipratropium-albuterol (DUONEB) 0.5-2.5 (3) MG/3ML nebulizer solution 3 mL  3 mL Nebulization Once Olin Hauser, DO        Past Medical History:  Diagnosis Date  . Arthritis    back  . Asthma   . Atherosclerosis of aorta ([place])   . Atherosclerosis of aorta ([place])   . Bronchitis 05/2017   chronic  . CAD (coronary artery disease)   . Carotid artery disease ([place])   . Carotid stenosis   . Carotid stenosis, bilateral   . Chronic bronchitis ([place])   .  Chronic cough   . COPD (chronic obstructive pulmonary disease) ([place])   . Dysrhythmia   . GERD (gastroesophageal reflux disease)   . Heart murmur   . HOH (hard of hearing)    hearing aids  . Hypercholesteremia   . Hypertension   . Kidney disease    stent  . Macular degeneration of left eye   . Myocardial infarction ([place]) 2016  . Pre-diabetes   . Seasonal allergies   . Seizure ([place])   . Seizures (H[facility])    viral meningitis  . Stroke [facility])    1991 and imaging 05/12/15 old strokes  . TIA (transient ischemic attack) 1991   x2  . Uses hearing aid   . Viral meningitis 2017  . Viral meningitis   . Wears dentures    upper and lower    Past Surgical History:  Procedure Laterality Date  . CAROTID ENDARTERECTOMY Left 2014  . CAROTID ENDARTERECTOMY Right 1991  . CATARACT EXTRACTION W/PHACO Left 06/24/2017   Procedure: CATARACT EXTRACTION PHACO AND INTRAOCULAR LENS PLACEMENT ([place]) LEFT BORDERLINE DIABETIC;  Surgeon: Leandrew Koyanagi, MD;  Location: [place];  Service: Ophthalmology;  Laterality: Left;  . CATARACT EXTRACTION W/PHACO Right 07/22/2017   Procedure: CATARACT EXTRACTION PHACO AND INTRAOCULAR LENS PLACEMENT ([place]) RIGHT BORDERLINE DIABETIC;  Surgeon: Leandrew Koyanagi, MD;  Location: [place];  Service: Ophthalmology;  Laterality: Right;  . CORONARY ARTERY BYPASS GRAFT     x5  . SKIN LESION EXCISION Left    left ear  . URETERAL STENT PLACEMENT       Social History   Tobacco Use  . Smoking status: Former Smoker    Packs/day: 2.00    Years: 10.00    Pack years: 20.00    Types: Cigarettes    Quit date: 04/28/1970    Years since quitting: 50.0  . Smokeless tobacco: Former Network engineer  . Vaping Use: Never used  Substance Use Topics  . Alcohol use: No    Alcohol/week: 0.0 standard drinks    Comment: previous  . Drug use: No     Family History  Problem Relation Age of Onset  . Stroke Mother   . Heart attack Mother   . Dementia  Mother   . Stroke Father   . Stroke Paternal Grandmother   . Stroke Paternal Grandfather   . Alzheimer's disease Paternal Grandfather   . Stroke Sister   . Alzheimer's disease Sister   . Dementia Sister   . Stroke Brother   . Alzheimer's disease Maternal Grandmother   . Dementia Maternal Grandmother   . Dementia Sister      Allergies  Allergen Reactions  . Fexofenadine-Pseudoephed Er Swelling  Tongue and face. Only with D product. Tolerates plain allegra at home.  .   . Nifedipine Swelling    Tongue and face    . Pseudoephedrine Swelling  . Allegra  [Fexofenadine] Swelling    Tongue and face. Only with D product. Tolerates plain allegra at home.   Jearl Klinefelter Ellipta [Umeclidinium-Vilanterol]   . Carvedilol Other (See Comments)    Light headed, heart racing, increase in blood pressure   . Prednisone Other (See Comments)    Oral prednisone. Confusion. Mental status changes    REVIEW OF SYSTEMS(Negative unless checked)  Constitutional: [] ???Weight loss [] ???Fever [] ???Chills Cardiac: [] ???Chest pain [] ???Chest pressure [x] ???Palpitations [] ???Shortness of breath when laying flat [] ???Shortness of breath at rest [x] ???Shortness of breath with exertion. Vascular: [] ???Pain in legs with walking [] ???Pain in legs at rest [] ???Pain in legs when laying flat [] ???Claudication [] ???Pain in feet when walking [] ???Pain in feet at rest [] ???Pain in feet when laying flat [] ???History of DVT [] ???Phlebitis [] ???Swelling in legs [] ???Varicose veins [] ???Non-healing ulcers Pulmonary: [] ???Uses home oxygen [] ???Productive cough [] ???Hemoptysis [] ???Wheeze [] ???COPD [] ???Asthma Neurologic: [] ???Dizziness [] ???Blackouts [] ???Seizures [] ???History of stroke [x] ???History of TIA [] ???Aphasia [] ???Temporary blindness [] ???Dysphagia [] ???Weakness or numbness in arms [] ???Weakness or numbness in legs Musculoskeletal: [] ???Arthritis  [] ???Joint swelling [] ???Joint pain [] ???Low back pain Hematologic: [] ???Easy bruising [] ???Easy bleeding [] ???Hypercoagulable state [] ???Anemic  Gastrointestinal: [] ???Blood in stool [] ???Vomiting blood [] ???Gastroesophageal reflux/heartburn [] ???Abdominal pain Genitourinary: [x] ???Chronic kidney disease [] ???Difficult urination [] ???Frequent urination [] ???Burning with urination [] ???Hematuria Skin: [] ???Rashes [] ???Ulcers [] ???Wounds Psychological: [] ???History of anxiety [] ???History of major depression.  Physical Examination  BP 137/71   Pulse 68   Ht 6\' 4"  (1.93 m)   Wt 216 lb (98 kg)   BMI 26.29 kg/m  Gen:  WD/WN, NAD Head: [place]/AT, No temporalis wasting. Ear/Nose/Throat: Hearing grossly intact, nares w/o erythema or drainage Eyes: Conjunctiva clear. Sclera non-icteric Neck: Supple.  Trachea midline Pulmonary:  Good air movement, no use of accessory muscles.  Cardiac: irregular Vascular:  Vessel Right Left  Radial Palpable Palpable       Gastrointestinal: soft, non-tender/non-distended. No guarding/reflex.  Musculoskeletal: M/S 5/5 throughout.  No deformity or atrophy. Mild LE edema. Neurologic: Sensation grossly intact in extremities.  Symmetrical.  Speech is fluent.  Psychiatric: Judgment intact, Mood & affect appropriate for pt's clinical situation. Dermatologic: No rashes or ulcers noted.  No cellulitis or open wounds.       Labs No results found for this or any previous visit (from the past 2160 hour(s)).  Radiology No results found.  Assessment/Plan Benign essential HTN blood pressure control important in reducing the progression of atherosclerotic disease. On appropriate oral medications.   Atrial fibrillation ([place]) On anticoagulation  Renal artery stenosis ([place]) To be checked later this year  Hyperlipidemia lipid control important in reducing the progression of atherosclerotic disease. Continue statin  therapy   Pre-diabetes blood glucose control important in reducing the progression of atherosclerotic disease. Also, involved in wound healing. On appropriate medications.   Bilateral carotid artery stenosis His duplex today does show more elevated velocities with velocities are greater than 416 peak systolic velocity in the common carotid artery today.  The right carotid endarterectomy site is widely patent. I had a long discussion with he and his wife today.  With the lesion in the common carotid artery, the velocities do not appear as high-grade but certainly there has been concern at another institution that this required repair and I think further evaluation of this lesion is appropriate to determine whether or not it is greater than 70%  and would require repair.  This is a remote left carotid endarterectomy.  At this point, an angiogram would be the most definitive test to evaluate the lesion and determine its need for treatment.  If indeed a greater than 70% lesion is encountered at the time of angiogram, intervention could be performed concomitantly.  I have discussed the risks and benefits of the procedures.  I discussed the reason and rationale for angiogram.  The patient and his wife voiced their understanding and they are agreeable to proceed.    Leotis Pain, MD  05/11/2020 12:41 PM    This note was created with Dragon medical transcription system.  Any errors from dictation are purely unintentional

## 2020-05-11 NOTE — Assessment & Plan Note (Signed)
blood glucose control important in reducing the progression of atherosclerotic disease. Also, involved in wound healing. On appropriate medications.  

## 2020-05-11 NOTE — Assessment & Plan Note (Signed)
His duplex today does show more elevated velocities with velocities are greater than 106 peak systolic velocity in the common carotid artery today.  The right carotid endarterectomy site is widely patent. I had a long discussion with he and his wife today.  With the lesion in the common carotid artery, the velocities do not appear as high-grade but certainly there has been concern at another institution that this required repair and I think further evaluation of this lesion is appropriate to determine whether or not it is greater than 70% and would require repair.  This is a remote left carotid endarterectomy.  At this point, an angiogram would be the most definitive test to evaluate the lesion and determine its need for treatment.  If indeed a greater than 70% lesion is encountered at the time of angiogram, intervention could be performed concomitantly.  I have discussed the risks and benefits of the procedures.  I discussed the reason and rationale for angiogram.  The patient and his wife voiced their understanding and they are agreeable to proceed.

## 2020-05-11 NOTE — Patient Instructions (Signed)
Carotid Angioplasty With Stent Carotid angioplasty with stent is a procedure to open or widen an artery in the neck (carotid artery) that has become narrowed. This is done by inflating a small balloon inside the artery and then placing a small piece of metal that looks like a coil or spring (stent) inside the artery. The stent helps keep the artery open by supporting the artery walls. The carotid arteries supply blood to the brain. When fats, cholesterol, and other materials (plaque) build up in an artery, the artery becomes narrow and can become blocked. This can reduce or block blood flow to certain areas of the brain, which can cause serious health problems, including stroke. Tell a health care provider about:  Any allergies you have.  All medicines you are taking, including vitamins, herbs, eye drops, creams, and over-the-counter medicines.  Any problems you or family members have had with anesthetic medicines.  Any blood disorders you have.  Any surgeries you have had.  Any medical conditions you have.  Whether you are pregnant or may be pregnant. What are the risks? Generally, this is a safe procedure. However, problems may occur, including:  Infection.  Bleeding.  Allergic reactions to medicines or dyes.  Damage to other structures or organs, or to the carotid artery itself.  The carotid artery becoming blocked again.  A collection of blood under the skin (hematoma) around the stent site that gets larger.  A blood clot in another part of the body.  Kidney injury.  Stroke.  Heart attack. What happens before the procedure?  Ask your health care provider about: ? Changing or stopping your regular medicines. This is especially important if you are taking diabetes medicines or blood thinners. ? Whether aspirin is recommended before this procedure. ? Taking over-the-counter medicines, vitamins, herbs, and supplements.  Follow instructions from your health care provider  about eating or drinking restrictions.  Do not use any products that contain nicotine or tobacco for 4 weeks before the procedure. These products include cigarettes, e-cigarettes, and chewing tobacco. If you need help quitting, ask your health care provider.  Ask your health care provider what steps will be taken to help prevent infection. These may include: ? Removing hair at the surgery site. ? Washing skin with a germ-killing soap. ? Taking antibiotic medicine.  You may have blood tests and imaging tests done.  Plan to have someone take you home from the hospital or clinic.  If you will be going home right after the procedure, plan to have someone with you for 24 hours. What happens during the procedure?  An IV will be inserted into one of your veins.  You may be given one or more of the following: ? A medicine to help you relax (sedative). ? A medicine to numb the area where the catheter will be inserted (local anesthetic).  Most commonly, an incision will be made in your groin. In some cases, an incision may be made in your wrist or forearm instead of your groin.  A small, thin tube (catheter) will be inserted through your incision, into an artery. The catheter will be threaded upward into your carotid artery. An X-ray machine (fluoroscope) will help your health care provider guide the catheter to the correct place in your artery.  Dye will be injected into the catheter and will travel to the narrow or blocked part of your carotid artery.  X-ray images will be taken of how the dye flows through your artery. While the images are   being taken, you may be given instructions about breathing, swallowing, moving, or talking.  A filter (distal protection device) will be inserted into your artery. This will be used to catch plaque that comes loose in your artery during the procedure. This reduces the risk of plaque moving into your brain.  A small balloon will be inserted into your  artery. The balloon will be inflated for a few seconds to widen your artery and will then be removed.  The stent will be placed in your artery.  A second small balloon will be inserted into your artery and inflated. This expands the stent inside of your artery so that the stent holds up the artery walls. The balloon will then be removed.  The catheter and the distal protection device will be removed from your artery.  Your incision may be closed with stitches (sutures), skin glue, or adhesive tape.  A bandage (dressing) will be placed over your incision. The procedure may vary among health care providers and hospitals.   What happens after the procedure?  Your blood pressure, heart rate, breathing rate, and blood oxygen level will be monitored until you leave the hospital or clinic.  You may continue to receive fluids and medicines through an IV.  You may need to have pressure placed on the incision site to prevent bleeding.  You will need to keep the area still for a few hours, or as long as directed by your health care provider. If the procedure was done in the groin, you will be instructed not to bend or cross your legs.  You may have some pain. Pain medicines will be available to help you.  You may have a test that uses sound waves to take pictures (ultrasound) of the carotid artery. This can be compared to future tests to check for changes in the artery.  Do not drive for 24 hours. Summary  Carotid angioplasty with stent is a procedure to open or widen an artery in the neck (carotid artery) that has become narrowed.  The procedure is done to lower the risk of problems that can result from reduced blood flow to the brain, including a stroke.  The stent placed inside the artery will help keep the artery open by supporting the artery walls.  Follow instructions from your health care provider about taking medicines and about eating and drinking before the procedure. This  information is not intended to replace advice given to you by your health care provider. Make sure you discuss any questions you have with your health care provider. Document Revised: 02/09/2018 Document Reviewed: 02/04/2018 Elsevier Patient Education  2021 Elsevier Inc.  

## 2020-05-11 NOTE — H&P (View-Only) (Signed)
  MRN : [id number]  Keith Cruz is a 80 y.o. (09/05/1940) male who presents with chief complaint of  Chief Complaint  Patient presents with  . Follow-up    U/S   .  History of Present Illness: Patient returns today in follow up of his carotid disease.  Since his last visit, he has not had any focal neurologic symptoms. Specifically, the patient denies amaurosis fugax, speech or swallowing difficulties, or arm or leg weakness or numbness.  About 6 months ago, he underwent redo right carotid endarterectomy for high-grade symptomatic stenosis at another institution.  There is been concern for recurrent left carotid disease as well.  His duplex a few months ago showed only mildly elevated velocities in the moderate range in the left carotid artery.  His duplex today does show more elevated velocities with velocities are greater than 200 peak systolic velocity in the common carotid artery today.  The right carotid endarterectomy site is widely patent.  Current Outpatient Medications  Medication Sig Dispense Refill  . ACCU-CHEK AVIVA PLUS test strip USE TO CHECK BLOOD SUGAR ONCE DAILY (ICD-10: R73.03) 100 strip 5  . albuterol (VENTOLIN HFA) 108 (90 Base) MCG/ACT inhaler Inhale 1-2 puffs into the lungs every 4 (four) hours as needed for wheezing or shortness of breath. 1 Inhaler 12  . aspirin EC 81 MG tablet Take 81 mg by mouth daily.    . benzonatate (TESSALON) 100 MG capsule benzonatate 100 mg capsule  TAKE 1 CAPSULE (100 MG TOTAL) BY MOUTH 3 (THREE) TIMES DAILY AS NEEDED FOR COUGH FOR UP TO 7 DAYS    . Cholecalciferol (VITAMIN D3) 2000 UNITS capsule Take 2,000 Units by mouth daily. am    . cyclobenzaprine (FLEXERIL) 5 MG tablet Take 1-2 tablets (5-10 mg total) by mouth at bedtime as needed for muscle spasms. 60 tablet 2  . ELIQUIS 5 MG TABS tablet TAKE 1 TABLET BY MOUTH TWICE A DAY 60 tablet 5  . fexofenadine (ALLEGRA) 180 MG tablet Take 1 tablet (180 mg total) by mouth daily as needed for  allergies or rhinitis. 90 tablet 3  . fluticasone (FLOVENT HFA) 110 MCG/ACT inhaler Inhale 2 puffs into the lungs 2 (two) times daily. Rinse your mouth 1 Inhaler 12  . folic acid (FOLVITE) 1 MG tablet Take 0.8 mg by mouth daily. am    . hydrALAZINE (APRESOLINE) 50 MG tablet Take 1 tablet (50 mg total) by mouth 3 (three) times daily. 270 tablet 3  . metoprolol tartrate (LOPRESSOR) 50 MG tablet Take 1 tablet (50 mg total) by mouth 2 (two) times daily. D/c coreg pt prefers metoprolol 180 tablet 3  . Multiple Vitamins-Minerals (CENTRUM SILVER 50+MEN PO) Take by mouth.    . pantoprazole (PROTONIX) 40 MG tablet Take 1 tablet (40 mg total) by mouth 2 (two) times daily before a meal. TRY TO TAKE ONLY 1X PER DAY INSTEAD OF 2 30 MIN BEFORE A MEAL 180 tablet 3  . atorvastatin (LIPITOR) 80 MG tablet Take 80 mg by mouth daily.    . Cyclobenzaprine HCl (CYCLOBENZAPRINE 20 TD) cyclobenzaprine    . fluticasone (FLONASE) 50 MCG/ACT nasal spray Place 2 sprays into both nostrils daily. 16 g 0  . glucose blood test strip E11.9 bid accuchek aviva plus Use as instructed 200 each 3  . hydrALAZINE (APRESOLINE) 25 MG tablet Take 25 mg by mouth 3 (three) times daily.    . lidocaine (LIDODERM) 5 % lidocaine 5 % topical patch  APPLY 1 PATCH BY   TOPICAL ROUTE ONCE DAILY (MAY WEAR UP TO 12HOURS.)    . traZODone (DESYREL) 25 mg TABS tablet trazodone     Current Facility-Administered Medications  Medication Dose Route Frequency Provider Last Rate Last Admin  . ipratropium-albuterol (DUONEB) 0.5-2.5 (3) MG/3ML nebulizer solution 3 mL  3 mL Nebulization Once Olin Hauser, DO        Past Medical History:  Diagnosis Date  . Arthritis    back  . Asthma   . Atherosclerosis of aorta ([place])   . Atherosclerosis of aorta ([place])   . Bronchitis 05/2017   chronic  . CAD (coronary artery disease)   . Carotid artery disease ([place])   . Carotid stenosis   . Carotid stenosis, bilateral   . Chronic bronchitis ([place])   .  Chronic cough   . COPD (chronic obstructive pulmonary disease) ([place])   . Dysrhythmia   . GERD (gastroesophageal reflux disease)   . Heart murmur   . HOH (hard of hearing)    hearing aids  . Hypercholesteremia   . Hypertension   . Kidney disease    stent  . Macular degeneration of left eye   . Myocardial infarction ([place]) 2016  . Pre-diabetes   . Seasonal allergies   . Seizure ([place])   . Seizures ([facility])    viral meningitis  . Stroke [facility])    1991 and imaging 05/12/15 old strokes  . TIA (transient ischemic attack) 1991   x2  . Uses hearing aid   . Viral meningitis 2017  . Viral meningitis   . Wears dentures    upper and lower    Past Surgical History:  Procedure Laterality Date  . CAROTID ENDARTERECTOMY Left 2014  . CAROTID ENDARTERECTOMY Right 1991  . CATARACT EXTRACTION W/PHACO Left 06/24/2017   Procedure: CATARACT EXTRACTION PHACO AND INTRAOCULAR LENS PLACEMENT ([place]) LEFT BORDERLINE DIABETIC;  Surgeon: Leandrew Koyanagi, MD;  Location: [place];  Service: Ophthalmology;  Laterality: Left;  . CATARACT EXTRACTION W/PHACO Right 07/22/2017   Procedure: CATARACT EXTRACTION PHACO AND INTRAOCULAR LENS PLACEMENT ([place]) RIGHT BORDERLINE DIABETIC;  Surgeon: Leandrew Koyanagi, MD;  Location: [place];  Service: Ophthalmology;  Laterality: Right;  . CORONARY ARTERY BYPASS GRAFT     x5  . SKIN LESION EXCISION Left    left ear  . URETERAL STENT PLACEMENT       Social History   Tobacco Use  . Smoking status: Former Smoker    Packs/day: 2.00    Years: 10.00    Pack years: 20.00    Types: Cigarettes    Quit date: 04/28/1970    Years since quitting: 50.0  . Smokeless tobacco: Former Network engineer  . Vaping Use: Never used  Substance Use Topics  . Alcohol use: No    Alcohol/week: 0.0 standard drinks    Comment: previous  . Drug use: No     Family History  Problem Relation Age of Onset  . Stroke Mother   . Heart attack Mother   . Dementia  Mother   . Stroke Father   . Stroke Paternal Grandmother   . Stroke Paternal Grandfather   . Alzheimer's disease Paternal Grandfather   . Stroke Sister   . Alzheimer's disease Sister   . Dementia Sister   . Stroke Brother   . Alzheimer's disease Maternal Grandmother   . Dementia Maternal Grandmother   . Dementia Sister      Allergies  Allergen Reactions  . Fexofenadine-Pseudoephed Er Swelling  Tongue and face. Only with D product. Tolerates plain allegra at home.  .   . Nifedipine Swelling    Tongue and face    . Pseudoephedrine Swelling  . Allegra  [Fexofenadine] Swelling    Tongue and face. Only with D product. Tolerates plain allegra at home.   Jearl Klinefelter Ellipta [Umeclidinium-Vilanterol]   . Carvedilol Other (See Comments)    Light headed, heart racing, increase in blood pressure   . Prednisone Other (See Comments)    Oral prednisone. Confusion. Mental status changes    REVIEW OF SYSTEMS(Negative unless checked)  Constitutional: [] ???Weight loss [] ???Fever [] ???Chills Cardiac: [] ???Chest pain [] ???Chest pressure [x] ???Palpitations [] ???Shortness of breath when laying flat [] ???Shortness of breath at rest [x] ???Shortness of breath with exertion. Vascular: [] ???Pain in legs with walking [] ???Pain in legs at rest [] ???Pain in legs when laying flat [] ???Claudication [] ???Pain in feet when walking [] ???Pain in feet at rest [] ???Pain in feet when laying flat [] ???History of DVT [] ???Phlebitis [] ???Swelling in legs [] ???Varicose veins [] ???Non-healing ulcers Pulmonary: [] ???Uses home oxygen [] ???Productive cough [] ???Hemoptysis [] ???Wheeze [] ???COPD [] ???Asthma Neurologic: [] ???Dizziness [] ???Blackouts [] ???Seizures [] ???History of stroke [x] ???History of TIA [] ???Aphasia [] ???Temporary blindness [] ???Dysphagia [] ???Weakness or numbness in arms [] ???Weakness or numbness in legs Musculoskeletal: [] ???Arthritis  [] ???Joint swelling [] ???Joint pain [] ???Low back pain Hematologic: [] ???Easy bruising [] ???Easy bleeding [] ???Hypercoagulable state [] ???Anemic  Gastrointestinal: [] ???Blood in stool [] ???Vomiting blood [] ???Gastroesophageal reflux/heartburn [] ???Abdominal pain Genitourinary: [x] ???Chronic kidney disease [] ???Difficult urination [] ???Frequent urination [] ???Burning with urination [] ???Hematuria Skin: [] ???Rashes [] ???Ulcers [] ???Wounds Psychological: [] ???History of anxiety [] ???History of major depression.  Physical Examination  BP 137/71   Pulse 68   Ht 6\' 4"  (1.93 m)   Wt 216 lb (98 kg)   BMI 26.29 kg/m  Gen:  WD/WN, NAD Head: [place]/AT, No temporalis wasting. Ear/Nose/Throat: Hearing grossly intact, nares w/o erythema or drainage Eyes: Conjunctiva clear. Sclera non-icteric Neck: Supple.  Trachea midline Pulmonary:  Good air movement, no use of accessory muscles.  Cardiac: irregular Vascular:  Vessel Right Left  Radial Palpable Palpable       Gastrointestinal: soft, non-tender/non-distended. No guarding/reflex.  Musculoskeletal: M/S 5/5 throughout.  No deformity or atrophy. Mild LE edema. Neurologic: Sensation grossly intact in extremities.  Symmetrical.  Speech is fluent.  Psychiatric: Judgment intact, Mood & affect appropriate for pt's clinical situation. Dermatologic: No rashes or ulcers noted.  No cellulitis or open wounds.       Labs No results found for this or any previous visit (from the past 2160 hour(s)).  Radiology No results found.  Assessment/Plan Benign essential HTN blood pressure control important in reducing the progression of atherosclerotic disease. On appropriate oral medications.   Atrial fibrillation ([place]) On anticoagulation  Renal artery stenosis ([place]) To be checked later this year  Hyperlipidemia lipid control important in reducing the progression of atherosclerotic disease. Continue statin  therapy   Pre-diabetes blood glucose control important in reducing the progression of atherosclerotic disease. Also, involved in wound healing. On appropriate medications.   Bilateral carotid artery stenosis His duplex today does show more elevated velocities with velocities are greater than 416 peak systolic velocity in the common carotid artery today.  The right carotid endarterectomy site is widely patent. I had a long discussion with he and his wife today.  With the lesion in the common carotid artery, the velocities do not appear as high-grade but certainly there has been concern at another institution that this required repair and I think further evaluation of this lesion is appropriate to determine whether or not it is greater than 70%  and would require repair.  This is a remote left carotid endarterectomy.  At this point, an angiogram would be the most definitive test to evaluate the lesion and determine its need for treatment.  If indeed a greater than 70% lesion is encountered at the time of angiogram, intervention could be performed concomitantly.  I have discussed the risks and benefits of the procedures.  I discussed the reason and rationale for angiogram.  The patient and his wife voiced their understanding and they are agreeable to proceed.    Leotis Pain, MD  05/11/2020 12:41 PM    This note was created with Dragon medical transcription system.  Any errors from dictation are purely unintentional

## 2020-05-21 ENCOUNTER — Telehealth (INDEPENDENT_AMBULATORY_CARE_PROVIDER_SITE_OTHER): Payer: Self-pay

## 2020-05-21 NOTE — Telephone Encounter (Signed)
Spoke with the patient's spouse and he is scheduled for a left carotid angio poss stent placement on 05/24/20 with a 10:15 am arrival time to the MM. Covid testing on 05/22/20 between 8-1 pm at the [facility]. Pre-procedure instructions were discussed.

## 2020-05-22 ENCOUNTER — Other Ambulatory Visit
Admission: RE | Admit: 2020-05-22 | Discharge: 2020-05-22 | Disposition: A | Discharge status: Home / Self Care | Payer: Medicare Other | Source: Ambulatory Visit | Attending: Vascular Surgery | Admitting: Vascular Surgery

## 2020-05-22 ENCOUNTER — Other Ambulatory Visit: Payer: Self-pay

## 2020-05-22 DIAGNOSIS — Z01812 Encounter for preprocedural laboratory examination: Secondary | ICD-10-CM | POA: Diagnosis not present

## 2020-05-22 DIAGNOSIS — Z20822 Contact with and (suspected) exposure to covid-19: Secondary | ICD-10-CM | POA: Insufficient documentation

## 2020-05-22 LAB — SARS CORONAVIRUS 2 (TAT 6-24 HRS): SARS Coronavirus 2: NEGATIVE

## 2020-05-24 ENCOUNTER — Other Ambulatory Visit (INDEPENDENT_AMBULATORY_CARE_PROVIDER_SITE_OTHER): Payer: Self-pay | Admitting: Nurse Practitioner

## 2020-05-24 NOTE — Telephone Encounter (Signed)
Spoke with the patient's wife and we went over the patient's procedure schedule for his carotid angio. Patient is on for 05/31/20 with a 8:00 am arrival time to the MM. Covid testing on 05/29/20 between 8-1 pm at the [facility]. Pre-procedure instructions were discussed as well.

## 2020-05-29 ENCOUNTER — Other Ambulatory Visit
Admission: RE | Admit: 2020-05-29 | Discharge: 2020-05-29 | Disposition: A | Discharge status: Home / Self Care | Payer: Medicare Other | Source: Ambulatory Visit | Attending: Vascular Surgery | Admitting: Vascular Surgery

## 2020-05-29 ENCOUNTER — Other Ambulatory Visit: Payer: Self-pay

## 2020-05-29 DIAGNOSIS — Z7951 Long term (current) use of inhaled steroids: Secondary | ICD-10-CM | POA: Diagnosis not present

## 2020-05-29 DIAGNOSIS — I4891 Unspecified atrial fibrillation: Secondary | ICD-10-CM | POA: Diagnosis not present

## 2020-05-29 DIAGNOSIS — I252 Old myocardial infarction: Secondary | ICD-10-CM | POA: Diagnosis not present

## 2020-05-29 DIAGNOSIS — I2510 Atherosclerotic heart disease of native coronary artery without angina pectoris: Secondary | ICD-10-CM | POA: Diagnosis not present

## 2020-05-29 DIAGNOSIS — I10 Essential (primary) hypertension: Secondary | ICD-10-CM | POA: Diagnosis not present

## 2020-05-29 DIAGNOSIS — R7303 Prediabetes: Secondary | ICD-10-CM | POA: Diagnosis not present

## 2020-05-29 DIAGNOSIS — E7800 Pure hypercholesterolemia, unspecified: Secondary | ICD-10-CM | POA: Diagnosis not present

## 2020-05-29 DIAGNOSIS — Z79899 Other long term (current) drug therapy: Secondary | ICD-10-CM | POA: Diagnosis not present

## 2020-05-29 DIAGNOSIS — Z87891 Personal history of nicotine dependence: Secondary | ICD-10-CM | POA: Diagnosis not present

## 2020-05-29 DIAGNOSIS — Z8673 Personal history of transient ischemic attack (TIA), and cerebral infarction without residual deficits: Secondary | ICD-10-CM | POA: Diagnosis not present

## 2020-05-29 DIAGNOSIS — Z7982 Long term (current) use of aspirin: Secondary | ICD-10-CM | POA: Diagnosis not present

## 2020-05-29 DIAGNOSIS — Z951 Presence of aortocoronary bypass graft: Secondary | ICD-10-CM | POA: Diagnosis not present

## 2020-05-29 DIAGNOSIS — Z20822 Contact with and (suspected) exposure to covid-19: Secondary | ICD-10-CM | POA: Insufficient documentation

## 2020-05-29 DIAGNOSIS — J449 Chronic obstructive pulmonary disease, unspecified: Secondary | ICD-10-CM | POA: Diagnosis not present

## 2020-05-29 DIAGNOSIS — Z8661 Personal history of infections of the central nervous system: Secondary | ICD-10-CM | POA: Diagnosis not present

## 2020-05-29 DIAGNOSIS — K219 Gastro-esophageal reflux disease without esophagitis: Secondary | ICD-10-CM | POA: Diagnosis not present

## 2020-05-29 DIAGNOSIS — E785 Hyperlipidemia, unspecified: Secondary | ICD-10-CM | POA: Diagnosis not present

## 2020-05-29 DIAGNOSIS — Z7901 Long term (current) use of anticoagulants: Secondary | ICD-10-CM | POA: Diagnosis not present

## 2020-05-29 DIAGNOSIS — I701 Atherosclerosis of renal artery: Secondary | ICD-10-CM | POA: Diagnosis not present

## 2020-05-29 DIAGNOSIS — Z01812 Encounter for preprocedural laboratory examination: Secondary | ICD-10-CM | POA: Insufficient documentation

## 2020-05-29 DIAGNOSIS — I6522 Occlusion and stenosis of left carotid artery: Secondary | ICD-10-CM | POA: Diagnosis not present

## 2020-05-29 LAB — SARS CORONAVIRUS 2 (TAT 6-24 HRS): SARS Coronavirus 2: NEGATIVE

## 2020-05-30 ENCOUNTER — Other Ambulatory Visit (INDEPENDENT_AMBULATORY_CARE_PROVIDER_SITE_OTHER): Payer: Self-pay | Admitting: Nurse Practitioner

## 2020-05-31 ENCOUNTER — Encounter
Admission: RE | Disposition: A | Discharge status: Home / Self Care | Payer: Self-pay | Source: Home / Self Care | Attending: Vascular Surgery

## 2020-05-31 ENCOUNTER — Other Ambulatory Visit: Payer: Self-pay

## 2020-05-31 ENCOUNTER — Inpatient Hospital Stay
Admission: RE | Admit: 2020-05-31 | Discharge: 2020-06-01 | DRG: 036 | Disposition: A | Discharge status: Home / Self Care | Payer: Medicare Other | Attending: Vascular Surgery | Admitting: Vascular Surgery

## 2020-05-31 ENCOUNTER — Encounter: Payer: Self-pay | Admitting: Vascular Surgery

## 2020-05-31 DIAGNOSIS — Z951 Presence of aortocoronary bypass graft: Secondary | ICD-10-CM | POA: Diagnosis not present

## 2020-05-31 DIAGNOSIS — Z79899 Other long term (current) drug therapy: Secondary | ICD-10-CM | POA: Diagnosis not present

## 2020-05-31 DIAGNOSIS — Z8673 Personal history of transient ischemic attack (TIA), and cerebral infarction without residual deficits: Secondary | ICD-10-CM | POA: Diagnosis not present

## 2020-05-31 DIAGNOSIS — I4891 Unspecified atrial fibrillation: Secondary | ICD-10-CM | POA: Diagnosis present

## 2020-05-31 DIAGNOSIS — I701 Atherosclerosis of renal artery: Secondary | ICD-10-CM | POA: Diagnosis present

## 2020-05-31 DIAGNOSIS — J449 Chronic obstructive pulmonary disease, unspecified: Secondary | ICD-10-CM | POA: Diagnosis present

## 2020-05-31 DIAGNOSIS — E785 Hyperlipidemia, unspecified: Secondary | ICD-10-CM | POA: Diagnosis present

## 2020-05-31 DIAGNOSIS — R7303 Prediabetes: Secondary | ICD-10-CM | POA: Diagnosis present

## 2020-05-31 DIAGNOSIS — E7800 Pure hypercholesterolemia, unspecified: Secondary | ICD-10-CM | POA: Diagnosis present

## 2020-05-31 DIAGNOSIS — Z7951 Long term (current) use of inhaled steroids: Secondary | ICD-10-CM | POA: Diagnosis not present

## 2020-05-31 DIAGNOSIS — Z7982 Long term (current) use of aspirin: Secondary | ICD-10-CM | POA: Diagnosis not present

## 2020-05-31 DIAGNOSIS — I252 Old myocardial infarction: Secondary | ICD-10-CM

## 2020-05-31 DIAGNOSIS — I2510 Atherosclerotic heart disease of native coronary artery without angina pectoris: Secondary | ICD-10-CM | POA: Diagnosis present

## 2020-05-31 DIAGNOSIS — Z20822 Contact with and (suspected) exposure to covid-19: Secondary | ICD-10-CM | POA: Diagnosis present

## 2020-05-31 DIAGNOSIS — K219 Gastro-esophageal reflux disease without esophagitis: Secondary | ICD-10-CM | POA: Diagnosis present

## 2020-05-31 DIAGNOSIS — Z8661 Personal history of infections of the central nervous system: Secondary | ICD-10-CM | POA: Diagnosis not present

## 2020-05-31 DIAGNOSIS — I10 Essential (primary) hypertension: Secondary | ICD-10-CM | POA: Diagnosis present

## 2020-05-31 DIAGNOSIS — Z87891 Personal history of nicotine dependence: Secondary | ICD-10-CM | POA: Diagnosis not present

## 2020-05-31 DIAGNOSIS — I6522 Occlusion and stenosis of left carotid artery: Principal | ICD-10-CM | POA: Diagnosis present

## 2020-05-31 DIAGNOSIS — Z7901 Long term (current) use of anticoagulants: Secondary | ICD-10-CM | POA: Diagnosis not present

## 2020-05-31 DIAGNOSIS — J441 Chronic obstructive pulmonary disease with (acute) exacerbation: Secondary | ICD-10-CM

## 2020-05-31 HISTORY — PX: CAROTID PTA/STENT INTERVENTION: CATH118231

## 2020-05-31 LAB — BUN: BUN: 18 mg/dL (ref 8–23)

## 2020-05-31 LAB — POCT ACTIVATED CLOTTING TIME: Activated Clotting Time: 285 seconds

## 2020-05-31 LAB — GLUCOSE, CAPILLARY: Glucose-Capillary: 123 mg/dL — ABNORMAL HIGH (ref 70–99)

## 2020-05-31 LAB — CREATININE, SERUM
Creatinine, Ser: 1.47 mg/dL — ABNORMAL HIGH (ref 0.61–1.24)
GFR, Estimated: 48 mL/min — ABNORMAL LOW (ref >60)

## 2020-05-31 SURGERY — CAROTID PTA/STENT INTERVENTION
Anesthesia: Moderate Sedation | Laterality: Left

## 2020-05-31 MED ORDER — CLOPIDOGREL BISULFATE 75 MG PO TABS
75.0000 mg | ORAL_TABLET | Freq: Every day | ORAL | Status: DC
  Administered 2020-06-01: 75 mg via ORAL
  Filled 2020-05-31: qty 1

## 2020-05-31 MED ORDER — HEPARIN SODIUM (PORCINE) 1000 UNIT/ML IJ SOLN
INTRAMUSCULAR | Status: DC | PRN
  Administered 2020-05-31: 8000 [IU] via INTRAVENOUS

## 2020-05-31 MED ORDER — MIDAZOLAM HCL 5 MG/5ML IJ SOLN
INTRAMUSCULAR | Status: AC
  Filled 2020-05-31: qty 5

## 2020-05-31 MED ORDER — ATORVASTATIN CALCIUM 80 MG PO TABS
80.0000 mg | ORAL_TABLET | Freq: Every day | ORAL | Status: DC
  Administered 2020-05-31: 80 mg via ORAL
  Filled 2020-05-31 (×2): qty 1

## 2020-05-31 MED ORDER — ACETAMINOPHEN 325 MG RE SUPP
325.0000 mg | RECTAL | Status: DC | PRN
  Filled 2020-05-31: qty 2

## 2020-05-31 MED ORDER — IPRATROPIUM-ALBUTEROL 0.5-2.5 (3) MG/3ML IN SOLN
3.0000 mL | Freq: Once | RESPIRATORY_TRACT | Status: AC
  Administered 2020-05-31: 3 mL via RESPIRATORY_TRACT

## 2020-05-31 MED ORDER — FAMOTIDINE 20 MG PO TABS: 40.0000 mg | ORAL_TABLET | Freq: Once | ORAL | Status: DC | PRN

## 2020-05-31 MED ORDER — APIXABAN 5 MG PO TABS
5.0000 mg | ORAL_TABLET | Freq: Two times a day (BID) | ORAL | Status: DC
  Administered 2020-05-31 – 2020-06-01 (×2): 5 mg via ORAL
  Filled 2020-05-31 (×4): qty 1

## 2020-05-31 MED ORDER — GUAIFENESIN-DM 100-10 MG/5ML PO SYRP
15.0000 mL | ORAL_SOLUTION | ORAL | Status: DC | PRN
  Filled 2020-05-31: qty 15

## 2020-05-31 MED ORDER — PROMETHAZINE HCL 25 MG/ML IJ SOLN
INTRAMUSCULAR | Status: AC
  Administered 2020-05-31: 12.5 mg via INTRAVENOUS
  Filled 2020-05-31: qty 1

## 2020-05-31 MED ORDER — MIDAZOLAM HCL 2 MG/2ML IJ SOLN
INTRAMUSCULAR | Status: DC | PRN
  Administered 2020-05-31: 2 mg via INTRAVENOUS

## 2020-05-31 MED ORDER — FENTANYL CITRATE (PF) 100 MCG/2ML IJ SOLN
INTRAMUSCULAR | Status: DC | PRN
  Administered 2020-05-31: 50 ug via INTRAVENOUS

## 2020-05-31 MED ORDER — PHENYLEPHRINE HCL (PRESSORS) 10 MG/ML IV SOLN
INTRAVENOUS | Status: AC
  Filled 2020-05-31: qty 1

## 2020-05-31 MED ORDER — HYDRALAZINE HCL 20 MG/ML IJ SOLN: 5.0000 mg | INTRAMUSCULAR | Status: DC | PRN

## 2020-05-31 MED ORDER — ATROPINE SULFATE 1 MG/10ML IJ SOSY
PREFILLED_SYRINGE | INTRAMUSCULAR | Status: AC
  Filled 2020-05-31: qty 20

## 2020-05-31 MED ORDER — VANCOMYCIN HCL IN DEXTROSE 1-5 GM/200ML-% IV SOLN
INTRAVENOUS | Status: AC
  Administered 2020-05-31: 1000 mg via INTRAVENOUS
  Filled 2020-05-31: qty 200

## 2020-05-31 MED ORDER — SODIUM CHLORIDE 0.9 % IV SOLN
500.0000 mL | Freq: Once | INTRAVENOUS | Status: AC | PRN
  Administered 2020-06-01: 500 mL via INTRAVENOUS

## 2020-05-31 MED ORDER — LABETALOL HCL 5 MG/ML IV SOLN: 10.0000 mg | INTRAVENOUS | Status: DC | PRN

## 2020-05-31 MED ORDER — POTASSIUM CHLORIDE CRYS ER 20 MEQ PO TBCR: 20.0000 meq | EXTENDED_RELEASE_TABLET | Freq: Every day | ORAL | Status: DC | PRN

## 2020-05-31 MED ORDER — MAGNESIUM SULFATE 2 GM/50ML IV SOLN
2.0000 g | Freq: Every day | INTRAVENOUS | Status: DC | PRN
  Filled 2020-05-31: qty 50

## 2020-05-31 MED ORDER — PROMETHAZINE HCL 25 MG/ML IJ SOLN: 12.5000 mg | Freq: Four times a day (QID) | INTRAMUSCULAR | Status: DC | PRN

## 2020-05-31 MED ORDER — HEPARIN SODIUM (PORCINE) 1000 UNIT/ML IJ SOLN
INTRAMUSCULAR | Status: AC
  Filled 2020-05-31: qty 1

## 2020-05-31 MED ORDER — METOPROLOL TARTRATE 5 MG/5ML IV SOLN: 2.0000 mg | INTRAVENOUS | Status: DC | PRN

## 2020-05-31 MED ORDER — METHYLPREDNISOLONE SODIUM SUCC 125 MG IJ SOLR: 125.0000 mg | Freq: Once | INTRAMUSCULAR | Status: DC | PRN

## 2020-05-31 MED ORDER — ONDANSETRON HCL 4 MG/2ML IJ SOLN
INTRAMUSCULAR | Status: AC
  Administered 2020-05-31: 4 mg via INTRAVENOUS
  Filled 2020-05-31: qty 2

## 2020-05-31 MED ORDER — ATROPINE SULFATE 1 MG/ML IJ SOLN
INTRAMUSCULAR | Status: DC | PRN
  Administered 2020-05-31: 1 mg via INTRAVENOUS

## 2020-05-31 MED ORDER — MIDAZOLAM HCL 2 MG/ML PO SYRP: 8.0000 mg | ORAL_SOLUTION | Freq: Once | ORAL | Status: DC | PRN

## 2020-05-31 MED ORDER — HYDROMORPHONE HCL 1 MG/ML IJ SOLN: 1.0000 mg | Freq: Once | INTRAMUSCULAR | Status: DC | PRN

## 2020-05-31 MED ORDER — METOPROLOL TARTRATE 50 MG PO TABS
50.0000 mg | ORAL_TABLET | Freq: Two times a day (BID) | ORAL | Status: DC
  Administered 2020-05-31: 50 mg via ORAL
  Filled 2020-05-31: qty 1

## 2020-05-31 MED ORDER — SODIUM CHLORIDE (PF) 0.9 % IJ SOLN
INTRAMUSCULAR | Status: AC
  Administered 2020-05-31: 10 mL
  Filled 2020-05-31: qty 10

## 2020-05-31 MED ORDER — HYDRALAZINE HCL 50 MG PO TABS
50.0000 mg | ORAL_TABLET | Freq: Three times a day (TID) | ORAL | Status: DC
  Administered 2020-05-31 – 2020-06-01 (×3): 50 mg via ORAL
  Filled 2020-05-31 (×5): qty 1

## 2020-05-31 MED ORDER — CYCLOBENZAPRINE HCL 10 MG PO TABS
5.0000 mg | ORAL_TABLET | Freq: Every evening | ORAL | Status: DC | PRN
  Filled 2020-05-31: qty 1

## 2020-05-31 MED ORDER — PANTOPRAZOLE SODIUM 40 MG PO TBEC
40.0000 mg | DELAYED_RELEASE_TABLET | Freq: Two times a day (BID) | ORAL | Status: DC
  Administered 2020-05-31 – 2020-06-01 (×2): 40 mg via ORAL
  Filled 2020-05-31 (×3): qty 1

## 2020-05-31 MED ORDER — ALBUTEROL SULFATE HFA 108 (90 BASE) MCG/ACT IN AERS
1.0000 | INHALATION_SPRAY | RESPIRATORY_TRACT | Status: DC | PRN
  Filled 2020-05-31: qty 6.7

## 2020-05-31 MED ORDER — PHENOL 1.4 % MT LIQD
1.0000 | OROMUCOSAL | Status: DC | PRN
  Filled 2020-05-31: qty 177

## 2020-05-31 MED ORDER — CEFAZOLIN SODIUM-DEXTROSE 2-4 GM/100ML-% IV SOLN
INTRAVENOUS | Status: AC
  Administered 2020-05-31: 2 g via INTRAVENOUS
  Filled 2020-05-31: qty 100

## 2020-05-31 MED ORDER — VANCOMYCIN HCL IN DEXTROSE 1-5 GM/200ML-% IV SOLN: 1000.0000 mg | Freq: Two times a day (BID) | INTRAVENOUS | Status: AC

## 2020-05-31 MED ORDER — FAMOTIDINE IN NACL 20-0.9 MG/50ML-% IV SOLN
20.0000 mg | Freq: Two times a day (BID) | INTRAVENOUS | Status: DC
  Administered 2020-05-31 – 2020-06-01 (×2): 20 mg via INTRAVENOUS
  Filled 2020-05-31 (×3): qty 50

## 2020-05-31 MED ORDER — ONDANSETRON HCL 4 MG/2ML IJ SOLN: 4.0000 mg | Freq: Four times a day (QID) | INTRAMUSCULAR | Status: DC | PRN

## 2020-05-31 MED ORDER — SODIUM CHLORIDE 0.9 % IV SOLN: INTRAVENOUS | Status: DC

## 2020-05-31 MED ORDER — CEFAZOLIN SODIUM-DEXTROSE 2-4 GM/100ML-% IV SOLN
2.0000 g | Freq: Once | INTRAVENOUS | Status: AC
  Administered 2020-05-31: 2 g via INTRAVENOUS

## 2020-05-31 MED ORDER — SODIUM CHLORIDE 0.9 % IV SOLN: INTRAVENOUS | Status: AC

## 2020-05-31 MED ORDER — FENTANYL CITRATE (PF) 100 MCG/2ML IJ SOLN
INTRAMUSCULAR | Status: AC
  Filled 2020-05-31: qty 2

## 2020-05-31 MED ORDER — MORPHINE SULFATE (PF) 4 MG/ML IV SOLN: 2.0000 mg | INTRAVENOUS | Status: DC | PRN

## 2020-05-31 MED ORDER — IPRATROPIUM-ALBUTEROL 0.5-2.5 (3) MG/3ML IN SOLN
RESPIRATORY_TRACT | Status: AC
  Filled 2020-05-31: qty 3

## 2020-05-31 MED ORDER — DOPAMINE-DEXTROSE 3.2-5 MG/ML-% IV SOLN
INTRAVENOUS | Status: AC
  Filled 2020-05-31: qty 250

## 2020-05-31 MED ORDER — ASPIRIN EC 81 MG PO TBEC
81.0000 mg | DELAYED_RELEASE_TABLET | Freq: Every day | ORAL | Status: DC
Start: 2020-06-01 — End: 2020-06-01
  Filled 2020-05-31 (×2): qty 1

## 2020-05-31 MED ORDER — DIPHENHYDRAMINE HCL 25 MG PO CAPS
ORAL_CAPSULE | ORAL | Status: AC
  Filled 2020-05-31: qty 1

## 2020-05-31 MED ORDER — BENZONATATE 100 MG PO CAPS
100.0000 mg | ORAL_CAPSULE | Freq: Three times a day (TID) | ORAL | Status: DC | PRN
  Filled 2020-05-31: qty 1

## 2020-05-31 MED ORDER — ALUM & MAG HYDROXIDE-SIMETH 200-200-20 MG/5ML PO SUSP: 15.0000 mL | ORAL | Status: DC | PRN

## 2020-05-31 MED ORDER — ACETAMINOPHEN 325 MG PO TABS: 325.0000 mg | ORAL_TABLET | ORAL | Status: DC | PRN

## 2020-05-31 MED ORDER — VITAMIN D3 25 MCG (1000 UNIT) PO TABS
2000.0000 [IU] | ORAL_TABLET | Freq: Every day | ORAL | Status: DC
  Administered 2020-06-01: 2000 [IU] via ORAL
  Filled 2020-05-31: qty 2

## 2020-05-31 MED ORDER — CEFAZOLIN SODIUM-DEXTROSE 2-4 GM/100ML-% IV SOLN: 2.0000 g | Freq: Three times a day (TID) | INTRAVENOUS | Status: AC

## 2020-05-31 MED ORDER — OXYCODONE-ACETAMINOPHEN 5-325 MG PO TABS
1.0000 | ORAL_TABLET | ORAL | Status: DC | PRN
  Administered 2020-05-31: 2 via ORAL

## 2020-05-31 MED ORDER — BUDESONIDE 0.25 MG/2ML IN SUSP
0.2500 mg | Freq: Two times a day (BID) | RESPIRATORY_TRACT | Status: DC
  Administered 2020-05-31: 0.25 mg via RESPIRATORY_TRACT
  Filled 2020-05-31 (×3): qty 2

## 2020-05-31 MED ORDER — DIPHENHYDRAMINE HCL 50 MG/ML IJ SOLN: 50.0000 mg | Freq: Once | INTRAMUSCULAR | Status: DC | PRN

## 2020-05-31 MED ORDER — OXYCODONE-ACETAMINOPHEN 5-325 MG PO TABS
ORAL_TABLET | ORAL | Status: AC
  Filled 2020-05-31: qty 2

## 2020-05-31 MED ORDER — DIPHENHYDRAMINE HCL 25 MG PO CAPS
25.0000 mg | ORAL_CAPSULE | Freq: Four times a day (QID) | ORAL | Status: AC | PRN
Start: 2020-05-31 — End: 2020-05-31
  Administered 2020-05-31: 25 mg via ORAL

## 2020-05-31 MED ORDER — LORATADINE 10 MG PO TABS
10.0000 mg | ORAL_TABLET | Freq: Every day | ORAL | Status: DC
  Administered 2020-06-01: 10 mg via ORAL
  Filled 2020-05-31 (×2): qty 1

## 2020-05-31 SURGICAL SUPPLY — 17 items
BALLN VIATRAC 5X20X135 (BALLOONS) ×2
BALLOON VIATRAC 5X20X135 (BALLOONS) ×1 IMPLANT
CATH ANGIO 5F PIGTAIL 100CM (CATHETERS) ×2 IMPLANT
CATH BEACON 5 .035 100 JB1 TIP (CATHETERS) ×2 IMPLANT
DEVICE EMBOSHIELD NAV6 4.0-7.0 (FILTER) ×2 IMPLANT
DEVICE SAFEGUARD 24CM (GAUZE/BANDAGES/DRESSINGS) ×2 IMPLANT
DEVICE STARCLOSE SE CLOSURE (Vascular Products) ×2 IMPLANT
GLIDEWIRE ANGLED SS 035X260CM (WIRE) ×2 IMPLANT
GLIDEWIRE STIFF .35X180X3 HYDR (WIRE) ×2 IMPLANT
KIT CAROTID MANIFOLD (MISCELLANEOUS) ×2 IMPLANT
KIT ENCORE 26 ADVANTAGE (KITS) ×2 IMPLANT
PACK ANGIOGRAPHY (CUSTOM PROCEDURE TRAY) ×2 IMPLANT
SHEATH BRITE TIP 5FRX11 (SHEATH) ×2 IMPLANT
SHEATH SHUTTLE 6FR (SHEATH) ×2 IMPLANT
STENT XACT CAR 10-8X40X136 (Permanent Stent) ×2 IMPLANT
WIRE G VAS 035X260 STIFF (WIRE) ×2 IMPLANT
WIRE GUIDERIGHT .035X150 (WIRE) ×2 IMPLANT

## 2020-05-31 NOTE — Interval H&P Note (Signed)
History and Physical Interval Note:  05/31/2020 9:05 AM  Keith Cruz  has presented today for surgery, with the diagnosis of LT Carotid Angiography poss stent placement   LT Carotid Artery Stenosis ABBOTT Rep  cc: M Godley, S Willey  Pt to have Covid test on 1-27.  The various methods of treatment have been discussed with the patient and family. After consideration of risks, benefits and other options for treatment, the patient has consented to  Procedure(s): CAROTID PTA/STENT INTERVENTIO (Left) as a surgical intervention.  The patient's history has been reviewed, patient examined, no change in status, stable for surgery.  I have reviewed the patient's chart and labs.  Questions were answered to the patient's satisfaction.     Leotis Pain

## 2020-05-31 NOTE — Op Note (Signed)
OPERATIVE NOTE DATE: 05/31/2020  PROCEDURE: 1.  Ultrasound guidance for vascular access right femoral artery 2.  Placement of a 10 mm proximal, 8 mm distal, 4 cm long exact stent with the use of the NAV-6 embolic protection device in the left carotid artery  PRE-OPERATIVE DIAGNOSIS: 1.  Recurrent left carotid artery stenosis. 2.  Status post redo right carotid endarterectomy last year  POST-OPERATIVE DIAGNOSIS:  Same as above  SURGEON: Leotis Pain, MD  ASSISTANT(S): None  ANESTHESIA: local/MCS  ESTIMATED BLOOD LOSS:  20 cc  CONTRAST: 80 cc  FLUORO TIME: 6.2 minutes  MODERATE CONSCIOUS SEDATION TIME:  Approximately 39 minutes using 2 mg of Versed and 50 mcg of Fentanyl  FINDING(S): 1.   75% left common carotid artery stenosis at the proximal endpoint of the previous endarterectomy  SPECIMEN(S):   none  INDICATIONS:   Patient is a 80 y.o. male who presents with recurrent left carotid artery stenosis.  The patient has had a previous carotid endarterectomy and carotid artery stenting was felt to be preferred to endarterectomy for that reason.  Risks and benefits were discussed and informed consent was obtained.   DESCRIPTION: After obtaining full informed written consent, the patient was brought back to the vascular suite and placed supine upon the table.  The patient received IV antibiotics prior to induction. Moderate conscious sedation was administered during a face to face encounter with the patient throughout the procedure with my supervision of the RN administering medicines and monitoring the patients vital signs and mental status throughout from the start of the procedure until the patient was taken to the recovery room.  After obtaining adequate anesthesia, the patient was prepped and draped in the standard fashion.   The right femoral artery was visualized with ultrasound and found to be widely patent. It was then accessed under direct ultrasound guidance without difficulty  with a Seldinger needle. A permanent image was recorded. A J-wire was placed and we then placed a 6 French sheath. The patient was then heparinized and a total of 8000 units of intravenous heparin were given and an ACT was checked to confirm successful anticoagulation. A pigtail catheter was then placed into the ascending aorta. This showed a type II aortic arch without proximal stenosis. I then selectively cannulated the left common carotid artery without difficulty with a JB 1 catheter and advanced into the mid left common carotid artery.  Cervical and cerebral carotid angiography was then performed. There were no obvious intracranial filling defects with reasonably good left-to-right cross-filling. The carotid bifurcation demonstrated an approximately 75% stenosis in the left common carotid artery at the proximal endpoint of his previous endarterectomy site.  The endarterectomy was somewhat patulous.  The internal carotid artery had very mild disease of the distal endpoint of the previous endarterectomy of less than 30%.  I then exchanged for the Amplatz Super Stiff wire. Over the Amplatz Super Stiff wire, a 6 [place] shuttle sheath was placed into the mid common carotid artery. I then used the NAV-6  Embolic protection device and crossed the lesion and parked this in the distal internal carotid artery at the base of the skull.  I then selected a 10 mm proximal by 8 mm distal 4 cm long exact stent. This was deployed across the lesion encompassing it in its entirety. A 5 mm diameter by 2 cm length balloon was used to post dilate the stent. Only about a 20% residual stenosis was present after angioplasty. Completion angiogram showed normal intracranial  filling without new defects. At this point I elected to terminate the procedure. The sheath was removed and StarClose closure device was deployed in the right femoral artery with excellent hemostatic result. The patient was taken to the recovery room in stable  condition having tolerated the procedure well.  COMPLICATIONS: none  CONDITION: stable  Leotis Pain 05/31/2020 11:45 AM   This note was created with Dragon Medical transcription system. Any errors in dictation are purely unintentional.

## 2020-06-01 DIAGNOSIS — I6522 Occlusion and stenosis of left carotid artery: Principal | ICD-10-CM

## 2020-06-01 LAB — BASIC METABOLIC PANEL
Anion gap: 5 (ref 5–15)
BUN: 16 mg/dL (ref 8–23)
CO2: 27 mmol/L (ref 22–32)
Calcium: 8.8 mg/dL — ABNORMAL LOW (ref 8.9–10.3)
Chloride: 106 mmol/L (ref 98–111)
Creatinine, Ser: 1.45 mg/dL — ABNORMAL HIGH (ref 0.61–1.24)
GFR, Estimated: 49 mL/min — ABNORMAL LOW (ref >60)
Glucose, Bld: 118 mg/dL — ABNORMAL HIGH (ref 70–99)
Potassium: 4.4 mmol/L (ref 3.5–5.1)
Sodium: 138 mmol/L (ref 135–145)

## 2020-06-01 LAB — CBC
HCT: 40.6 % (ref 39.0–52.0)
Hemoglobin: 13.5 g/dL (ref 13.0–17.0)
MCH: 35.1 pg — ABNORMAL HIGH (ref 26.0–34.0)
MCHC: 33.3 g/dL (ref 30.0–36.0)
MCV: 105.5 fL — ABNORMAL HIGH (ref 80.0–100.0)
Platelets: 184 10*3/uL (ref 150–400)
RBC: 3.85 MIL/uL — ABNORMAL LOW (ref 4.22–5.81)
RDW: 14 % (ref 11.5–15.5)
WBC: 8.9 10*3/uL (ref 4.0–10.5)
nRBC: 0 % (ref 0.0–0.2)

## 2020-06-01 MED ORDER — CLOPIDOGREL BISULFATE 75 MG PO TABS: 75.0000 mg | ORAL_TABLET | Freq: Every day | ORAL | 0 refills | Status: DC

## 2020-06-01 MED ORDER — CEFAZOLIN SODIUM-DEXTROSE 2-4 GM/100ML-% IV SOLN
INTRAVENOUS | Status: AC
  Administered 2020-06-01: 2 g via INTRAVENOUS
  Filled 2020-06-01: qty 100

## 2020-06-01 MED ORDER — VANCOMYCIN HCL IN DEXTROSE 1-5 GM/200ML-% IV SOLN
INTRAVENOUS | Status: AC
  Administered 2020-06-01: 1000 mg via INTRAVENOUS
  Filled 2020-06-01: qty 200

## 2020-06-01 NOTE — Progress Notes (Signed)
0745: 17mls of air removed from R femoral PAD. Patient tolerated well. No new bleeding or bruising.   4401: 63mls of air removed from R femoral PAD. Patient tolerated well. No new bleeding or bruising.   0945: 32mls of air removed from R femoral PAD. Patient tolerated well. No new bleeding or bruising.   1100: 21mls of air removed from R femoral PAD. Patient tolerated well. No new bleeding or bruising.   1200: PAD removed site covered with gauze and paper tape.

## 2020-06-01 NOTE — Progress Notes (Signed)
Nsg Discharge Note  Admit Date:  05/31/2020 Discharge date: 06/01/2020   Keith Cruz to be D/C'd Home per MD order.  AVS completed.   Patient/caregiver able to verbalize understanding.  Discharge Medication: Allergies as of 06/01/2020      Reactions   Fexofenadine-pseudoephed Er Swelling   Tongue and face. Only with D product. Tolerates plain allegra at home.  .    Nifedipine Swelling   Tongue and face   Pseudoephedrine Swelling   Allegra  [fexofenadine] Swelling   Tongue and face. Only with D product. Tolerates plain allegra at home.    Anoro Ellipta [umeclidinium-vilanterol]    Carvedilol Other (See Comments)   Light headed, heart racing, increase in blood pressure    Prednisone Other (See Comments)   Oral prednisone. Confusion. Mental status changes      Medication List    TAKE these medications   glucose blood test strip E11.9 bid accuchek aviva plus Use as instructed   Accu-Chek Aviva Plus test strip Generic drug: glucose blood USE TO CHECK BLOOD SUGAR ONCE DAILY (ICD-10: R73.03)   albuterol 108 (90 Base) MCG/ACT inhaler Commonly known as: VENTOLIN HFA Inhale 1-2 puffs into the lungs every 4 (four) hours as needed for wheezing or shortness of breath.   aspirin EC 81 MG tablet Take 81 mg by mouth daily.   atorvastatin 80 MG tablet Commonly known as: LIPITOR Take 80 mg by mouth daily.   benzonatate 100 MG capsule Commonly known as: TESSALON benzonatate 100 mg capsule  TAKE 1 CAPSULE (100 MG TOTAL) BY MOUTH 3 (THREE) TIMES DAILY AS NEEDED FOR COUGH FOR UP TO 7 DAYS   CENTRUM SILVER 50+MEN PO Take by mouth.   clopidogrel 75 MG tablet Commonly known as: PLAVIX Take 1 tablet (75 mg total) by mouth daily at 6 (six) AM. For one month only Start taking on: June 02, 2020   CYCLOBENZAPRINE 20 TD cyclobenzaprine   cyclobenzaprine 5 MG tablet Commonly known as: FLEXERIL Take 1-2 tablets (5-10 mg total) by mouth at bedtime as needed for muscle spasms.    Eliquis 5 MG Tabs tablet Generic drug: apixaban TAKE 1 TABLET BY MOUTH TWICE A DAY   fexofenadine 180 MG tablet Commonly known as: ALLEGRA Take 1 tablet (180 mg total) by mouth daily as needed for allergies or rhinitis.   Flovent HFA 110 MCG/ACT inhaler Generic drug: fluticasone Inhale 2 puffs into the lungs 2 (two) times daily. Rinse your mouth   fluticasone 50 MCG/ACT nasal spray Commonly known as: FLONASE Place 2 sprays into both nostrils daily.   folic acid 1 MG tablet Commonly known as: FOLVITE Take 0.8 mg by mouth daily. am   hydrALAZINE 50 MG tablet Commonly known as: APRESOLINE Take 1 tablet (50 mg total) by mouth 3 (three) times daily.   hydrALAZINE 25 MG tablet Commonly known as: APRESOLINE Take 25 mg by mouth 3 (three) times daily.   lidocaine 5 % Commonly known as: LIDODERM lidocaine 5 % topical patch  APPLY 1 PATCH BY TOPICAL ROUTE ONCE DAILY (MAY WEAR UP TO 12HOURS.)   metoprolol tartrate 50 MG tablet Commonly known as: LOPRESSOR Take 1 tablet (50 mg total) by mouth 2 (two) times daily. D/c coreg pt prefers metoprolol   pantoprazole 40 MG tablet Commonly known as: PROTONIX Take 1 tablet (40 mg total) by mouth 2 (two) times daily before a meal. TRY TO TAKE ONLY 1X PER DAY INSTEAD OF 2 30 MIN BEFORE A MEAL   traZODone 25 mg  Tabs tablet Commonly known as: DESYREL trazodone   Vitamin D3 50 MCG (2000 UT) capsule Take 2,000 Units by mouth daily. am       Discharge Assessment: Vitals:   06/01/20 0951 06/01/20 1051  BP: (!) 128/56 (!) 123/55  Pulse: (!) 58 61  Resp: 14 14  Temp:    SpO2: 99% 95%   Skin clean, dry and intact without evidence of skin break down, no evidence of skin tears noted. IV catheter discontinued intact. Site without signs and symptoms of complications - no redness or edema noted at insertion site, patient denies c/o pain - only slight tenderness at site.  Dressing with slight pressure applied.  D/c  Instructions-Education: Discharge instructions given to patient/family with verbalized understanding. D/c education completed with patient/family including follow up instructions, medication list, d/c activities limitations if indicated, with other d/c instructions as indicated by MD - patient able to verbalize understanding, all questions fully answered. Patient instructed to return to ED, call 911, or call MD for any changes in condition.  Patient escorted via [place], and D/C home via private auto.  Tresa Endo, RN 06/01/2020 12:10 PM

## 2020-06-01 NOTE — Discharge Summary (Signed)
[facility]    Discharge Summary  Patient ID:  Keith Cruz MRN: [id number] DOB/AGE: [birth year] 80 y.o.  Admit date: 05/31/2020 Discharge date: 06/01/2020 Date of Surgery: 05/31/2020 Surgeon: Surgeon(s): Algernon Huxley, MD  Admission Diagnosis: Carotid stenosis, left [I65.22]  Discharge Diagnoses:  Carotid stenosis, left [I65.22]  Secondary Diagnoses: Past Medical History:  Diagnosis Date  . Arthritis    back  . Asthma   . Atherosclerosis of aorta ([place])   . Atherosclerosis of aorta ([place])   . Bronchitis 05/2017   chronic  . CAD (coronary artery disease)   . Carotid artery disease ([place])   . Carotid stenosis   . Carotid stenosis, bilateral   . Chronic bronchitis ([place])   . Chronic cough   . COPD (chronic obstructive pulmonary disease) ([place])   . Dysrhythmia   . GERD (gastroesophageal reflux disease)   . Heart murmur   . HOH (hard of hearing)    hearing aids  . Hypercholesteremia   . Hypertension   . Kidney disease    stent  . Macular degeneration of left eye   . Myocardial infarction ([place]) 2016  . Pre-diabetes   . Seasonal allergies   . Seizure ([place])   . Seizures ([facility])    viral meningitis  . Stroke [facility])    1991 and imaging 05/12/15 old strokes  . TIA (transient ischemic attack) 1991   x2  . Uses hearing aid   . Viral meningitis 2017  . Viral meningitis   . Wears dentures    upper and lower   Procedure(s): 05/31/20 1.  Ultrasound guidance for vascular access right femoral artery 2.  Placement of a 10 mm proximal, 8 mm distal, 4 cm long exact stent with the use of the NAV-6 embolic protection device in the left carotid artery  Discharged Condition: Good  HPI / Hospital Course:  Patient is a 80 y.o. male who presents with recurrent left carotid artery stenosis.  The patient has had a previous carotid endarterectomy and carotid artery stenting was felt to be preferred to endarterectomy for that reason.  Risks and benefits were discussed  and informed consent was obtained.  On May 31, 2020 the patient underwent:  3.  Ultrasound guidance for vascular access right femoral artery 4.  Placement of a 10 mm proximal, 8 mm distal, 4 cm long exact stent with the use of the NAV-6 embolic protection device in the left carotid artery  The patient tolerated the procedure well was transferred from the angiography suite to the recovery room for observation overnight.  Patient's night of procedure was unremarkable.  During his brief stay, the patient is diet was advanced, he was urinating independently, his discomfort was controlled to the use of p.o. pain medication he was ambulating at baseline.  Day of discharge, the patient was afebrile with stable vital signs and essentially unremarkable physical exam.  Physical exam:  Alert and oriented x3, no acute distress Face: Symmetrical, tongue midline Neck: Trachea midline.  No swelling or ecchymosis Cardiovascular: Regular rate and rhythm Pulmonary: Clear to auscultation bilaterally Abdomen: Soft, nontender, nondistended Right groin: Access site - No swelling or ecchymosis noted. Extremities: Warm distally toes.  Motor/sensory is intact. Neurological: Intact, 5 out of 5 upper/lower.  No deficits noted on exam.  Labs: As below  Complications: None  Consults: None  Significant Diagnostic Studies: CBC Lab Results  Component Value Date   WBC 8.9 06/01/2020   HGB 13.5 06/01/2020   HCT 40.6  06/01/2020   MCV 105.5 (H) 06/01/2020   PLT 184 06/01/2020   BMET    Component Value Date/Time   NA 138 06/01/2020 0619   NA 140 01/15/2018 0000   NA 140 10/04/2013 0418   K 4.4 06/01/2020 0619   K 4.0 10/04/2013 0418   CL 106 06/01/2020 0619   CL 106 10/04/2013 0418   CO2 27 06/01/2020 0619   CO2 30 10/04/2013 0418   GLUCOSE 118 (H) 06/01/2020 0619   GLUCOSE 108 (H) 10/04/2013 0418   BUN 16 06/01/2020 0619   BUN 22 (A) 01/15/2018 0000   BUN 10 10/04/2013 0418   CREATININE 1.45 (H)  06/01/2020 0619   CREATININE 1.42 (H) 06/29/2017 0946   CALCIUM 8.8 (L) 06/01/2020 0619   CALCIUM 8.6 10/04/2013 0418   GFRNONAA 49 (L) 06/01/2020 0619   GFRNONAA 47 (L) 06/29/2017 0946   GFRAA 55 (L) 06/29/2017 0946   COAG Lab Results  Component Value Date   INR 1.0 10/01/2013   INR 1.0 11/25/2012   Disposition:  Discharge to :Home  Allergies as of 06/01/2020      Reactions   Fexofenadine-pseudoephed Er Swelling   Tongue and face. Only with D product. Tolerates plain allegra at home.  .    Nifedipine Swelling   Tongue and face   Pseudoephedrine Swelling   Allegra  [fexofenadine] Swelling   Tongue and face. Only with D product. Tolerates plain allegra at home.    Anoro Ellipta [umeclidinium-vilanterol]    Carvedilol Other (See Comments)   Light headed, heart racing, increase in blood pressure    Prednisone Other (See Comments)   Oral prednisone. Confusion. Mental status changes      Medication List    TAKE these medications   glucose blood test strip E11.9 bid accuchek aviva plus Use as instructed   Accu-Chek Aviva Plus test strip Generic drug: glucose blood USE TO CHECK BLOOD SUGAR ONCE DAILY (ICD-10: R73.03)   albuterol 108 (90 Base) MCG/ACT inhaler Commonly known as: VENTOLIN HFA Inhale 1-2 puffs into the lungs every 4 (four) hours as needed for wheezing or shortness of breath.   aspirin EC 81 MG tablet Take 81 mg by mouth daily.   atorvastatin 80 MG tablet Commonly known as: LIPITOR Take 80 mg by mouth daily.   benzonatate 100 MG capsule Commonly known as: TESSALON benzonatate 100 mg capsule  TAKE 1 CAPSULE (100 MG TOTAL) BY MOUTH 3 (THREE) TIMES DAILY AS NEEDED FOR COUGH FOR UP TO 7 DAYS   CENTRUM SILVER 50+MEN PO Take by mouth.   clopidogrel 75 MG tablet Commonly known as: PLAVIX Take 1 tablet (75 mg total) by mouth daily at 6 (six) AM. For one month only Start taking on: June 02, 2020   CYCLOBENZAPRINE 20 TD cyclobenzaprine    cyclobenzaprine 5 MG tablet Commonly known as: FLEXERIL Take 1-2 tablets (5-10 mg total) by mouth at bedtime as needed for muscle spasms.   Eliquis 5 MG Tabs tablet Generic drug: apixaban TAKE 1 TABLET BY MOUTH TWICE A DAY   fexofenadine 180 MG tablet Commonly known as: ALLEGRA Take 1 tablet (180 mg total) by mouth daily as needed for allergies or rhinitis.   Flovent HFA 110 MCG/ACT inhaler Generic drug: fluticasone Inhale 2 puffs into the lungs 2 (two) times daily. Rinse your mouth   fluticasone 50 MCG/ACT nasal spray Commonly known as: FLONASE Place 2 sprays into both nostrils daily.   folic acid 1 MG tablet Commonly known as: FOLVITE Take  0.8 mg by mouth daily. am   hydrALAZINE 50 MG tablet Commonly known as: APRESOLINE Take 1 tablet (50 mg total) by mouth 3 (three) times daily.   hydrALAZINE 25 MG tablet Commonly known as: APRESOLINE Take 25 mg by mouth 3 (three) times daily.   lidocaine 5 % Commonly known as: LIDODERM lidocaine 5 % topical patch  APPLY 1 PATCH BY TOPICAL ROUTE ONCE DAILY (MAY WEAR UP TO 12HOURS.)   metoprolol tartrate 50 MG tablet Commonly known as: LOPRESSOR Take 1 tablet (50 mg total) by mouth 2 (two) times daily. D/c coreg pt prefers metoprolol   pantoprazole 40 MG tablet Commonly known as: PROTONIX Take 1 tablet (40 mg total) by mouth 2 (two) times daily before a meal. TRY TO TAKE ONLY 1X PER DAY INSTEAD OF 2 30 MIN BEFORE A MEAL   traZODone 25 mg Tabs tablet Commonly known as: DESYREL trazodone   Vitamin D3 50 MCG (2000 UT) capsule Take 2,000 Units by mouth daily. am      Verbal and written Discharge instructions given to the patient. Wound care per Discharge AVS  Follow-up Information    Dew, Erskine Squibb, MD Follow up in 1 month(s).   Specialties: Vascular Surgery, Radiology, Interventional Cardiology Why: Can see Dew or Arna Medici.  Will need carotid duplex with visit. Contact information: [place]  [address] [phone]              Signed: Sela Hua, PA-C  06/01/2020, 11:14 AM

## 2020-06-01 NOTE — Discharge Instructions (Signed)
You may shower as of tomorrow.  Please keep your groins clean and dry.  Gently clean your groins with soap and water.  Gently pat dry. Please do not engage in any strenuous activity or lifting greater than 10 pounds until you are cleared at your first postoperative visit.

## 2020-06-08 ENCOUNTER — Other Ambulatory Visit: Payer: Self-pay | Admitting: Internal Medicine

## 2020-06-08 DIAGNOSIS — I10 Essential (primary) hypertension: Secondary | ICD-10-CM

## 2020-06-16 ENCOUNTER — Other Ambulatory Visit: Payer: Self-pay | Admitting: Internal Medicine

## 2020-06-16 DIAGNOSIS — J4530 Mild persistent asthma, uncomplicated: Secondary | ICD-10-CM

## 2020-06-16 DIAGNOSIS — J440 Chronic obstructive pulmonary disease with acute lower respiratory infection: Secondary | ICD-10-CM

## 2020-06-16 DIAGNOSIS — J209 Acute bronchitis, unspecified: Secondary | ICD-10-CM

## 2020-06-28 ENCOUNTER — Other Ambulatory Visit (INDEPENDENT_AMBULATORY_CARE_PROVIDER_SITE_OTHER): Payer: Self-pay | Admitting: Vascular Surgery

## 2020-06-28 DIAGNOSIS — Z95820 Peripheral vascular angioplasty status with implants and grafts: Secondary | ICD-10-CM

## 2020-06-28 DIAGNOSIS — I6522 Occlusion and stenosis of left carotid artery: Secondary | ICD-10-CM

## 2020-07-03 ENCOUNTER — Other Ambulatory Visit: Payer: Self-pay

## 2020-07-03 ENCOUNTER — Ambulatory Visit (INDEPENDENT_AMBULATORY_CARE_PROVIDER_SITE_OTHER): Payer: Medicare Other | Admitting: Vascular Surgery

## 2020-07-03 ENCOUNTER — Ambulatory Visit (INDEPENDENT_AMBULATORY_CARE_PROVIDER_SITE_OTHER): Payer: Medicare Other

## 2020-07-03 ENCOUNTER — Encounter (INDEPENDENT_AMBULATORY_CARE_PROVIDER_SITE_OTHER): Payer: Self-pay | Admitting: Vascular Surgery

## 2020-07-03 VITALS — BP 119/58 | HR 71 | Resp 16 | Wt 218.0 lb

## 2020-07-03 DIAGNOSIS — I701 Atherosclerosis of renal artery: Secondary | ICD-10-CM | POA: Diagnosis not present

## 2020-07-03 DIAGNOSIS — Z95820 Peripheral vascular angioplasty status with implants and grafts: Secondary | ICD-10-CM | POA: Diagnosis not present

## 2020-07-03 DIAGNOSIS — I6522 Occlusion and stenosis of left carotid artery: Secondary | ICD-10-CM

## 2020-07-03 DIAGNOSIS — I10 Essential (primary) hypertension: Secondary | ICD-10-CM

## 2020-07-03 DIAGNOSIS — I6523 Occlusion and stenosis of bilateral carotid arteries: Secondary | ICD-10-CM

## 2020-07-03 NOTE — Assessment & Plan Note (Signed)
Duplex today shows this carotid stent to be widely patent as is his redo right carotid endarterectomy done at another institution last year.  Overall doing well.  Continue current medical regimen.  Recheck in 3 months.

## 2020-07-03 NOTE — Assessment & Plan Note (Signed)
blood pressure control important in reducing the progression of atherosclerotic disease. On appropriate oral medications.  

## 2020-07-03 NOTE — Assessment & Plan Note (Signed)
Duplex today shows widely patent right renal artery stent without recurrent stenosis.  Left renal artery has good velocities with no evidence of significant stenosis on that side either.  Overall doing well.  Continue to check on an annual basis.

## 2020-07-03 NOTE — Progress Notes (Signed)
MRN : [id number]  Keith Cruz is a 80 y.o. (08-05-1940) male who presents with chief complaint of  Chief Complaint  Patient presents with   Follow-up    Ultrasound follow up  .  History of Present Illness: Patient returns today in follow up of multiple vascular issues.  About a month ago, he underwent a left carotid stent placement for high-grade recurrent stenosis.  He did well and had no periprocedural complications.  Duplex today shows this carotid stent to be widely patent as is his redo right carotid endarterectomy done at another institution last year. He is also several years status post right renal artery stent placement for high-grade renal artery stenosis.  His blood pressure is doing well.  He has no complaints related to his kidneys today.  Duplex shows his right renal artery stent to be widely patent in his left renal artery to have no hemodynamically significant stenosis.  Current Outpatient Medications  Medication Sig Dispense Refill   ACCU-CHEK AVIVA PLUS test strip USE TO CHECK BLOOD SUGAR ONCE DAILY (ICD-10: R73.03) 100 strip 5   aspirin EC 81 MG tablet Take 81 mg by mouth daily.     atorvastatin (LIPITOR) 80 MG tablet Take 80 mg by mouth daily.     benzonatate (TESSALON) 100 MG capsule benzonatate 100 mg capsule  TAKE 1 CAPSULE (100 MG TOTAL) BY MOUTH 3 (THREE) TIMES DAILY AS NEEDED FOR COUGH FOR UP TO 7 DAYS     Cholecalciferol (VITAMIN D3) 2000 UNITS capsule Take 2,000 Units by mouth daily. am     ELIQUIS 5 MG TABS tablet TAKE 1 TABLET BY MOUTH TWICE A DAY 60 tablet 5   fexofenadine (ALLEGRA) 180 MG tablet Take 1 tablet (180 mg total) by mouth daily as needed for allergies or rhinitis. 90 tablet 3   fluticasone (FLOVENT HFA) 110 MCG/ACT inhaler Inhale 2 puffs into the lungs 2 (two) times daily. Rinse your mouth 1 Inhaler 12   folic acid (FOLVITE) 1 MG tablet Take 0.8 mg by mouth daily. am     hydrALAZINE (APRESOLINE) 50 MG tablet Take 1 tablet (50 mg  total) by mouth 3 (three) times daily. 270 tablet 3   metoprolol tartrate (LOPRESSOR) 50 MG tablet Take 1 tablet (50 mg total) by mouth 2 (two) times daily. D/c coreg pt prefers metoprolol 180 tablet 3   Multiple Vitamins-Minerals (CENTRUM SILVER 50+MEN PO) Take by mouth.     pantoprazole (PROTONIX) 40 MG tablet Take 1 tablet (40 mg total) by mouth 2 (two) times daily before a meal. TRY TO TAKE ONLY 1X PER DAY INSTEAD OF 2 30 MIN BEFORE A MEAL 180 tablet 3   PROAIR HFA 108 (90 Base) MCG/ACT inhaler INHALE 1-2 PUFFS INTO THE LUNGS EVERY 4 (FOUR) HOURS AS NEEDED FOR WHEEZING OR SHORTNESS OF BREATH. 8.5 each 12   clopidogrel (PLAVIX) 75 MG tablet Take 1 tablet (75 mg total) by mouth daily at 6 (six) AM. For one month only 30 tablet 0   cyclobenzaprine (FLEXERIL) 5 MG tablet Take 1-2 tablets (5-10 mg total) by mouth at bedtime as needed for muscle spasms. (Patient not taking: No sig reported) 60 tablet 2   Cyclobenzaprine HCl (CYCLOBENZAPRINE 20 TD) cyclobenzaprine     fluticasone (FLONASE) 50 MCG/ACT nasal spray Place 2 sprays into both nostrils daily. (Patient not taking: Reported on 05/31/2020) 16 g 0   glucose blood test strip E11.9 bid accuchek aviva plus Use as instructed 200 each 3   hydrALAZINE (  APRESOLINE) 25 MG tablet TAKE 1 TABLET BY MOUTH THREE TIMES A DAY *STOP TAKING THE 10MG * 270 tablet 3   lidocaine (LIDODERM) 5 % lidocaine 5 % topical patch  APPLY 1 PATCH BY TOPICAL ROUTE ONCE DAILY (MAY WEAR UP TO 12HOURS.) (Patient not taking: Reported on 05/31/2020)     traZODone (DESYREL) 25 mg TABS tablet trazodone     Current Facility-Administered Medications  Medication Dose Route Frequency Provider Last Rate Last Admin   ipratropium-albuterol (DUONEB) 0.5-2.5 (3) MG/3ML nebulizer solution 3 mL  3 mL Nebulization Once Olin Hauser, DO        Past Medical History:  Diagnosis Date   Arthritis    back   Asthma    Atherosclerosis of aorta (HCC)    Atherosclerosis of  aorta ([place])    Bronchitis 05/2017   chronic   CAD (coronary artery disease)    Carotid artery disease (HCC)    Carotid stenosis    Carotid stenosis, bilateral    Chronic bronchitis (HCC)    Chronic cough    COPD (chronic obstructive pulmonary disease) (HCC)    Dysrhythmia    GERD (gastroesophageal reflux disease)    Heart murmur    HOH (hard of hearing)    hearing aids   Hypercholesteremia    Hypertension    Kidney disease    stent   Macular degeneration of left eye    Myocardial infarction ([place]) 2016   Pre-diabetes    Seasonal allergies    Seizure ([place])    Seizures ([place])    viral meningitis   Stroke [facility])    1991 and imaging 05/12/15 old strokes   TIA (transient ischemic attack) 1991   x2   Uses hearing aid    Viral meningitis 2017   Viral meningitis    Wears dentures    upper and lower    Past Surgical History:  Procedure Laterality Date   CAROTID ENDARTERECTOMY Left 2014   CAROTID ENDARTERECTOMY Right 1991   CAROTID PTA/STENT INTERVENTION Left 05/31/2020   Procedure: CAROTID PTA/STENT INTERVENTIO;  Surgeon: Algernon Huxley, MD;  Location: [facility];  Service: Cardiovascular;  Laterality: Left;   CATARACT EXTRACTION W/PHACO Left 06/24/2017   Procedure: CATARACT EXTRACTION PHACO AND INTRAOCULAR LENS PLACEMENT ([place]) LEFT BORDERLINE DIABETIC;  Surgeon: Leandrew Koyanagi, MD;  Location: [facility];  Service: Ophthalmology;  Laterality: Left;   CATARACT EXTRACTION W/PHACO Right 07/22/2017   Procedure: CATARACT EXTRACTION PHACO AND INTRAOCULAR LENS PLACEMENT ([place]) RIGHT BORDERLINE DIABETIC;  Surgeon: Leandrew Koyanagi, MD;  Location: [place];  Service: Ophthalmology;  Laterality: Right;   CORONARY ARTERY BYPASS GRAFT     x5   SKIN LESION EXCISION Left    left ear   URETERAL STENT PLACEMENT       Social History   Tobacco Use   Smoking status: Former Smoker    Packs/day: 2.00    Years: 10.00     Pack years: 20.00    Types: Cigarettes    Quit date: 04/28/1970    Years since quitting: 50.2   Smokeless tobacco: Former Counsellor Use: Never used  Substance Use Topics   Alcohol use: No    Alcohol/week: 0.0 standard drinks    Comment: previous   Drug use: No      Family History  Problem Relation Age of Onset   Stroke Mother    Heart attack Mother    Dementia Mother    Stroke Father  Stroke Paternal Grandmother    Stroke Paternal Grandfather    Alzheimer's disease Paternal Grandfather    Stroke Sister    Alzheimer's disease Sister    Dementia Sister    Stroke Brother    Alzheimer's disease Maternal Grandmother    Dementia Maternal Grandmother    Dementia Sister     Allergies  Allergen Reactions   Fexofenadine-Pseudoephed Er Swelling    Tongue and face. Only with D product. Tolerates plain allegra at home.  .    Nifedipine Swelling    Tongue and face     Pseudoephedrine Swelling   Allegra  [Fexofenadine] Swelling    Tongue and face. Only with D product. Tolerates plain allegra at home.    Anoro Ellipta [Umeclidinium-Vilanterol]    Carvedilol Other (See Comments)    Light headed, heart racing, increase in blood pressure    Prednisone Other (See Comments)    Oral prednisone. Confusion. Mental status changes     REVIEW OF SYSTEMS (Negative unless checked)  Constitutional: [] Weight loss  [] Fever  [] Chills Cardiac: [] Chest pain   [] Chest pressure   [] Palpitations   [] Shortness of breath when laying flat   [] Shortness of breath at rest   [] Shortness of breath with exertion. Vascular:  [] Pain in legs with walking   [] Pain in legs at rest   [] Pain in legs when laying flat   [] Claudication   [] Pain in feet when walking  [] Pain in feet at rest  [] Pain in feet when laying flat   [] History of DVT   [] Phlebitis   [] Swelling in legs   [] Varicose veins   [] Non-healing ulcers Pulmonary:   [] Uses home oxygen   [] Productive cough    [] Hemoptysis   [] Wheeze  [] COPD   [] Asthma Neurologic:  [] Dizziness  [] Blackouts   [] Seizures   [x] History of stroke   [] History of TIA  [] Aphasia   [] Temporary blindness   [] Dysphagia   [] Weakness or numbness in arms   [] Weakness or numbness in legs Musculoskeletal:  [x] Arthritis   [] Joint swelling   [x] Joint pain   [] Low back pain Hematologic:  [] Easy bruising  [] Easy bleeding   [] Hypercoagulable state   [] Anemic   Gastrointestinal:  [] Blood in stool   [] Vomiting blood  [] Gastroesophageal reflux/heartburn   [] Abdominal pain Genitourinary:  [x] Chronic kidney disease   [] Difficult urination  [] Frequent urination  [] Burning with urination   [] Hematuria Skin:  [] Rashes   [] Ulcers   [] Wounds Psychological:  [] History of anxiety   []  History of major depression.  Physical Examination  BP (!) 119/58 (BP Location: Left Arm)    Pulse 71    Resp 16    Wt 218 lb (98.9 kg)    BMI 26.54 kg/m  Gen:  WD/WN, NAD Head: [place]/AT, No temporalis wasting. Ear/Nose/Throat: Hearing grossly intact, nares w/o erythema or drainage Eyes: Conjunctiva clear. Sclera non-icteric Neck: Supple.  Trachea midline Pulmonary:  Good air movement, no use of accessory muscles.  Cardiac: RRR, no JVD Vascular:  Vessel Right Left  Radial Palpable Palpable           Musculoskeletal: M/S 5/5 throughout.  No deformity or atrophy. No edema. Neurologic: Sensation grossly intact in extremities.  Symmetrical.  Speech is fluent.  Psychiatric: Judgment intact, Mood & affect appropriate for pt's clinical situation. Dermatologic: No rashes or ulcers noted.  No cellulitis or open wounds.       Labs Recent Results (from the past 2160 hour(s))  SARS CORONAVIRUS 2 (TAT 6-24 HRS) Nasopharyngeal Nasopharyngeal Swab  Status: None   Collection Time: 05/22/20 11:34 AM   Specimen: Nasopharyngeal Swab  Result Value Ref Range   SARS Coronavirus 2 NEGATIVE NEGATIVE    Comment: (NOTE) SARS-CoV-2 target nucleic acids are NOT  DETECTED.  The SARS-CoV-2 RNA is generally detectable in upper and lower respiratory specimens during the acute phase of infection. Negative results do not preclude SARS-CoV-2 infection, do not rule out co-infections with other pathogens, and should not be used as the sole basis for treatment or other patient management decisions. Negative results must be combined with clinical observations, patient history, and epidemiological information. The expected result is Negative.  Fact Sheet for Patients: SugarRoll.be  Fact Sheet for Healthcare Providers: https://www.woods-mathews.com/  This test is not yet approved or cleared by the [place] FDA and  has been authorized for detection and/or diagnosis of SARS-CoV-2 by FDA under an Emergency Use Authorization (EUA). This EUA will remain  in effect (meaning this test can be used) for the duration of the COVID-19 declaration under Se ction 564(b)(1) of the Act, 21 U.S.C. section 360bbb-3(b)(1), unless the authorization is terminated or revoked sooner.  Performed at [facility], [place] [address] [place], [place] [address]   SARS CORONAVIRUS 2 (TAT 6-24 HRS) Nasopharyngeal Nasopharyngeal Swab     Status: None   Collection Time: 05/29/20 10:10 AM   Specimen: Nasopharyngeal Swab  Result Value Ref Range   SARS Coronavirus 2 NEGATIVE NEGATIVE    Comment: (NOTE) SARS-CoV-2 target nucleic acids are NOT DETECTED.  The SARS-CoV-2 RNA is generally detectable in upper and lower respiratory specimens during the acute phase of infection. Negative results do not preclude SARS-CoV-2 infection, do not rule out co-infections with other pathogens, and should not be used as the sole basis for treatment or other patient management decisions. Negative results must be combined with clinical observations, patient history, and epidemiological information. The expected result is Negative.  Fact Sheet for  Patients: SugarRoll.be  Fact Sheet for Healthcare Providers: https://www.woods-mathews.com/  This test is not yet approved or cleared by the [place] FDA and  has been authorized for detection and/or diagnosis of SARS-CoV-2 by FDA under an Emergency Use Authorization (EUA). This EUA will remain  in effect (meaning this test can be used) for the duration of the COVID-19 declaration under Se ction 564(b)(1) of the Act, 21 U.S.C. section 360bbb-3(b)(1), unless the authorization is terminated or revoked sooner.  Performed at [facility], [place] [address] [place], [place] [address]   BUN     Status: None   Collection Time: 05/31/20  9:38 AM  Result Value Ref Range   BUN 18 8 - 23 mg/dL    Comment: Performed at [facility], [address] [place], [place] [address]  Creatinine, serum     Status: Abnormal   Collection Time: 05/31/20  9:38 AM  Result Value Ref Range   Creatinine, Ser 1.47 (H) 0.61 - 1.24 mg/dL   GFR, Estimated 48 (L) >60 mL/min    Comment: (NOTE) Calculated using the CKD-EPI Creatinine Equation (2021) Performed at [facility], [address] [place], [place] [address]   Glucose, capillary     Status: Abnormal   Collection Time: 05/31/20  9:49 AM  Result Value Ref Range   Glucose-Capillary 123 (H) 70 - 99 mg/dL    Comment: Glucose reference range applies only to samples taken after fasting for at least 8 hours.  POCT Activated clotting time     Status: None  Collection Time: 05/31/20 11:18 AM  Result Value Ref Range   Activated Clotting Time 285 seconds  CBC     Status: Abnormal   Collection Time: 06/01/20  6:19 AM  Result Value Ref Range   WBC 8.9 4.0 - 10.5 K/uL   RBC 3.85 (L) 4.22 - 5.81 MIL/uL   Hemoglobin 13.5 13.0 - 17.0 g/dL   HCT 40.6 39.0 - 52.0 %   MCV 105.5 (H) 80.0 - 100.0 fL   MCH 35.1 (H) 26.0 - 34.0 pg   MCHC 33.3 30.0 - 36.0 g/dL   RDW 14.0 11.5 - 15.5 %   Platelets  184 150 - 400 K/uL   nRBC 0.0 0.0 - 0.2 %    Comment: Performed at [facility], [address], [place], [place] [address]  Basic metabolic panel     Status: Abnormal   Collection Time: 06/01/20  6:19 AM  Result Value Ref Range   Sodium 138 135 - 145 mmol/L   Potassium 4.4 3.5 - 5.1 mmol/L   Chloride 106 98 - 111 mmol/L   CO2 27 22 - 32 mmol/L   Glucose, Bld 118 (H) 70 - 99 mg/dL    Comment: Glucose reference range applies only to samples taken after fasting for at least 8 hours.   BUN 16 8 - 23 mg/dL   Creatinine, Ser 1.45 (H) 0.61 - 1.24 mg/dL   Calcium 8.8 (L) 8.9 - 10.3 mg/dL   GFR, Estimated 49 (L) >60 mL/min    Comment: (NOTE) Calculated using the CKD-EPI Creatinine Equation (2021)    Anion gap 5 5 - 15    Comment: Performed at [facility], [address], [place], [place] [address]    Radiology No results found.  Assessment/Plan  Bilateral carotid artery stenosis Duplex today shows this carotid stent to be widely patent as is his redo right carotid endarterectomy done at another institution last year.  Overall doing well.  Continue current medical regimen.  Recheck in 3 months.  Benign essential HTN blood pressure control important in reducing the progression of atherosclerotic disease. On appropriate oral medications.   Renal artery stenosis (HCC) Duplex today shows widely patent right renal artery stent without recurrent stenosis.  Left renal artery has good velocities with no evidence of significant stenosis on that side either.  Overall doing well.  Continue to check on an annual basis.    Leotis Pain, MD  07/03/2020 10:07 AM    This note was created with Dragon medical transcription system.  Any errors from dictation are purely unintentional

## 2020-07-23 ENCOUNTER — Telehealth: Payer: Self-pay | Admitting: Internal Medicine

## 2020-07-23 NOTE — Telephone Encounter (Signed)
Tylenol arthritis do not exceed amount of bottle if still continues address at in person appt

## 2020-07-23 NOTE — Telephone Encounter (Signed)
Patient would like to be advised on pain medication for back pain. He has health issues and does not know what do take.

## 2020-07-25 NOTE — Telephone Encounter (Signed)
Patient's wife informed and verbalized understanding.  She will inform the Patient

## 2020-07-26 DIAGNOSIS — H35342 Macular cyst, hole, or pseudohole, left eye: Secondary | ICD-10-CM | POA: Diagnosis not present

## 2020-08-23 ENCOUNTER — Other Ambulatory Visit (INDEPENDENT_AMBULATORY_CARE_PROVIDER_SITE_OTHER): Payer: Self-pay | Admitting: Nurse Practitioner

## 2020-08-23 ENCOUNTER — Other Ambulatory Visit (INDEPENDENT_AMBULATORY_CARE_PROVIDER_SITE_OTHER): Payer: Self-pay

## 2020-08-27 DIAGNOSIS — I701 Atherosclerosis of renal artery: Secondary | ICD-10-CM | POA: Diagnosis not present

## 2020-08-27 DIAGNOSIS — I10 Essential (primary) hypertension: Secondary | ICD-10-CM | POA: Diagnosis not present

## 2020-08-27 DIAGNOSIS — N1831 Chronic kidney disease, stage 3a: Secondary | ICD-10-CM | POA: Diagnosis not present

## 2020-08-29 ENCOUNTER — Other Ambulatory Visit: Payer: Self-pay | Admitting: Internal Medicine

## 2020-08-29 DIAGNOSIS — J4530 Mild persistent asthma, uncomplicated: Secondary | ICD-10-CM

## 2020-09-11 DIAGNOSIS — R059 Cough, unspecified: Secondary | ICD-10-CM | POA: Diagnosis not present

## 2020-09-11 DIAGNOSIS — R0600 Dyspnea, unspecified: Secondary | ICD-10-CM | POA: Diagnosis not present

## 2020-09-11 DIAGNOSIS — J439 Emphysema, unspecified: Secondary | ICD-10-CM | POA: Diagnosis not present

## 2020-10-05 ENCOUNTER — Ambulatory Visit (INDEPENDENT_AMBULATORY_CARE_PROVIDER_SITE_OTHER): Payer: Medicare Other | Admitting: Vascular Surgery

## 2020-10-05 ENCOUNTER — Encounter (INDEPENDENT_AMBULATORY_CARE_PROVIDER_SITE_OTHER): Payer: Self-pay | Admitting: Vascular Surgery

## 2020-10-05 ENCOUNTER — Ambulatory Visit (INDEPENDENT_AMBULATORY_CARE_PROVIDER_SITE_OTHER): Payer: Medicare Other

## 2020-10-05 ENCOUNTER — Other Ambulatory Visit: Payer: Self-pay

## 2020-10-05 VITALS — BP 101/54 | HR 71 | Resp 16 | Wt 214.0 lb

## 2020-10-05 DIAGNOSIS — N183 Chronic kidney disease, stage 3 unspecified: Secondary | ICD-10-CM

## 2020-10-05 DIAGNOSIS — I10 Essential (primary) hypertension: Secondary | ICD-10-CM

## 2020-10-05 DIAGNOSIS — E785 Hyperlipidemia, unspecified: Secondary | ICD-10-CM | POA: Diagnosis not present

## 2020-10-05 DIAGNOSIS — I701 Atherosclerosis of renal artery: Secondary | ICD-10-CM | POA: Diagnosis not present

## 2020-10-05 DIAGNOSIS — I6523 Occlusion and stenosis of bilateral carotid arteries: Secondary | ICD-10-CM | POA: Diagnosis not present

## 2020-10-05 NOTE — Assessment & Plan Note (Signed)
Duplex today shows some mildly elevated velocities bilaterally that would be on the lower end of the 40 to 59% range but no significant recurrent stenosis requiring intervention at this time.  Continue current medical regimen.  Recheck in 6 months.

## 2020-10-05 NOTE — Assessment & Plan Note (Signed)
Avoid contrast unless absolutely necessary. 

## 2020-10-05 NOTE — Assessment & Plan Note (Signed)
lipid control important in reducing the progression of atherosclerotic disease. Continue statin therapy  

## 2020-10-05 NOTE — Progress Notes (Signed)
MRN : [id number]  Keith Cruz is a 80 y.o. (1940-12-30) male who presents with chief complaint of  Chief Complaint  Patient presents with   Follow-up    Ultrasound follow up  .  History of Present Illness: Patient returns today in follow up of his carotid disease.  About 4 months ago, he underwent left carotid stent placement for high-grade recurrent stenosis.  Last year, he had a redo endarterectomy at [facility].  His swallowing has been problematic since then and it continues to be a problem.  Otherwise, he is doing well and does not have complaints.  Duplex today shows some mildly elevated velocities bilaterally that would be on the lower end of the 40 to 59% range but no significant recurrent stenosis requiring intervention at this time.  Current Outpatient Medications  Medication Sig Dispense Refill   ACCU-CHEK AVIVA PLUS test strip USE TO CHECK BLOOD SUGAR ONCE DAILY (ICD-10: R73.03) 100 strip 5   aspirin EC 81 MG tablet Take 81 mg by mouth daily.     atorvastatin (LIPITOR) 80 MG tablet Take 80 mg by mouth daily.     Cholecalciferol (VITAMIN D3) 2000 UNITS capsule Take 2,000 Units by mouth daily. am     cyclobenzaprine (FLEXERIL) 5 MG tablet Take 1-2 tablets (5-10 mg total) by mouth at bedtime as needed for muscle spasms. 60 tablet 2   ELIQUIS 5 MG TABS tablet TAKE 1 TABLET BY MOUTH TWICE A DAY 60 tablet 5   fexofenadine (ALLEGRA) 180 MG tablet TAKE 1 TABLET (180 MG TOTAL) BY MOUTH DAILY AS NEEDED FOR ALLERGIES OR RHINITIS. 90 tablet 3   fluticasone (FLOVENT HFA) 110 MCG/ACT inhaler Inhale 2 puffs into the lungs 2 (two) times daily. Rinse your mouth 1 Inhaler 12   folic acid (FOLVITE) 1 MG tablet Take 0.8 mg by mouth daily. am     hydrALAZINE (APRESOLINE) 50 MG tablet Take 1 tablet (50 mg total) by mouth 3 (three) times daily. 270 tablet 3   metoprolol tartrate (LOPRESSOR) 50 MG tablet Take 1 tablet (50 mg total) by mouth 2 (two) times daily. D/c coreg pt prefers metoprolol 180 tablet 3    Multiple Vitamins-Minerals (CENTRUM SILVER 50+MEN PO) Take by mouth.     pantoprazole (PROTONIX) 40 MG tablet Take 1 tablet (40 mg total) by mouth 2 (two) times daily before a meal. TRY TO TAKE ONLY 1X PER DAY INSTEAD OF 2 30 MIN BEFORE A MEAL 180 tablet 3   PROAIR HFA 108 (90 Base) MCG/ACT inhaler INHALE 1-2 PUFFS INTO THE LUNGS EVERY 4 (FOUR) HOURS AS NEEDED FOR WHEEZING OR SHORTNESS OF BREATH. 8.5 each 12   SPIRIVA HANDIHALER 18 MCG inhalation capsule 1 capsule daily.     benzonatate (TESSALON) 100 MG capsule benzonatate 100 mg capsule  TAKE 1 CAPSULE (100 MG TOTAL) BY MOUTH 3 (THREE) TIMES DAILY AS NEEDED FOR COUGH FOR UP TO 7 DAYS     Cyclobenzaprine HCl (CYCLOBENZAPRINE 20 TD) cyclobenzaprine     fluticasone (FLONASE) 50 MCG/ACT nasal spray Place 2 sprays into both nostrils daily. (Patient not taking: Reported on 05/31/2020) 16 g 0   glucose blood test strip E11.9 bid accuchek aviva plus Use as instructed 200 each 3   hydrALAZINE (APRESOLINE) 25 MG tablet TAKE 1 TABLET BY MOUTH THREE TIMES A DAY *STOP TAKING THE 10MG * 270 tablet 3   lidocaine (LIDODERM) 5 % lidocaine 5 % topical patch  APPLY 1 PATCH BY TOPICAL ROUTE ONCE DAILY (MAY WEAR UP  TO 12HOURS.) (Patient not taking: Reported on 05/31/2020)     traZODone (DESYREL) 25 mg TABS tablet trazodone     Current Facility-Administered Medications  Medication Dose Route Frequency Provider Last Rate Last Admin   ipratropium-albuterol (DUONEB) 0.5-2.5 (3) MG/3ML nebulizer solution 3 mL  3 mL Nebulization Once Olin Hauser, DO        Past Medical History:  Diagnosis Date   Arthritis    back   Asthma    Atherosclerosis of aorta (HCC)    Atherosclerosis of aorta ([place])    Bronchitis 05/2017   chronic   CAD (coronary artery disease)    Carotid artery disease (HCC)    Carotid stenosis    Carotid stenosis, bilateral    Chronic bronchitis (HCC)    Chronic cough    COPD (chronic obstructive pulmonary disease) (HCC)    Dysrhythmia     GERD (gastroesophageal reflux disease)    Heart murmur    HOH (hard of hearing)    hearing aids   Hypercholesteremia    Hypertension    Kidney disease    stent   Macular degeneration of left eye    Myocardial infarction ([place]) 2016   Pre-diabetes    Seasonal allergies    Seizure ([place])    Seizures ([place])    viral meningitis   Stroke [facility])    1991 and imaging 05/12/15 old strokes   TIA (transient ischemic attack) 1991   x2   Uses hearing aid    Viral meningitis 2017   Viral meningitis    Wears dentures    upper and lower    Past Surgical History:  Procedure Laterality Date   CAROTID ENDARTERECTOMY Left 2014   CAROTID ENDARTERECTOMY Right 1991   CAROTID PTA/STENT INTERVENTION Left 05/31/2020   Procedure: CAROTID PTA/STENT INTERVENTIO;  Surgeon: Algernon Huxley, MD;  Location: [facility];  Service: Cardiovascular;  Laterality: Left;   CATARACT EXTRACTION W/PHACO Left 06/24/2017   Procedure: CATARACT EXTRACTION PHACO AND INTRAOCULAR LENS PLACEMENT ([place]) LEFT BORDERLINE DIABETIC;  Surgeon: Leandrew Koyanagi, MD;  Location: [place];  Service: Ophthalmology;  Laterality: Left;   CATARACT EXTRACTION W/PHACO Right 07/22/2017   Procedure: CATARACT EXTRACTION PHACO AND INTRAOCULAR LENS PLACEMENT ([place]) RIGHT BORDERLINE DIABETIC;  Surgeon: Leandrew Koyanagi, MD;  Location: [place];  Service: Ophthalmology;  Laterality: Right;   CORONARY ARTERY BYPASS GRAFT     x5   SKIN LESION EXCISION Left    left ear   URETERAL STENT PLACEMENT       Social History   Tobacco Use   Smoking status: Former    Packs/day: 2.00    Years: 10.00    Pack years: 20.00    Types: Cigarettes    Quit date: 04/28/1970    Years since quitting: 50.4   Smokeless tobacco: Former  Scientific laboratory technician Use: Never used  Substance Use Topics   Alcohol use: No    Alcohol/week: 0.0 standard drinks    Comment: previous   Drug use: No       Family History  Problem Relation Age  of Onset   Stroke Mother    Heart attack Mother    Dementia Mother    Stroke Father    Stroke Paternal Grandmother    Stroke Paternal Grandfather    Alzheimer's disease Paternal Grandfather    Stroke Sister    Alzheimer's disease Sister    Dementia Sister    Stroke Brother    Alzheimer's  disease Maternal Grandmother    Dementia Maternal Grandmother    Dementia Sister      Allergies  Allergen Reactions   Fexofenadine-Pseudoephed Er Swelling    Tongue and face. Only with D product. Tolerates plain allegra at home.  .    Nifedipine Swelling    Tongue and face     Pseudoephedrine Swelling   Allegra  [Fexofenadine] Swelling    Tongue and face. Only with D product. Tolerates plain allegra at home.    Anoro Ellipta [Umeclidinium-Vilanterol]    Carvedilol Other (See Comments)    Light headed, heart racing, increase in blood pressure    Prednisone Other (See Comments)    Oral prednisone. Confusion. Mental status changes    REVIEW OF SYSTEMS (Negative unless checked)   Constitutional: [] Weight loss  [] Fever  [] Chills Cardiac: [] Chest pain   [] Chest pressure   [] Palpitations   [] Shortness of breath when laying flat   [] Shortness of breath at rest   [] Shortness of breath with exertion. Vascular:  [] Pain in legs with walking   [] Pain in legs at rest   [] Pain in legs when laying flat   [] Claudication   [] Pain in feet when walking  [] Pain in feet at rest  [] Pain in feet when laying flat   [] History of DVT   [] Phlebitis   [] Swelling in legs   [] Varicose veins   [] Non-healing ulcers Pulmonary:   [] Uses home oxygen   [] Productive cough   [] Hemoptysis   [] Wheeze  [] COPD   [] Asthma Neurologic:  [] Dizziness  [] Blackouts   [] Seizures   [x] History of stroke   [] History of TIA  [] Aphasia   [] Temporary blindness   [] Dysphagia   [] Weakness or numbness in arms   [] Weakness or numbness in legs Musculoskeletal:  [x] Arthritis   [] Joint swelling   [x] Joint pain   [] Low back pain Hematologic:  [] Easy  bruising  [] Easy bleeding   [] Hypercoagulable state   [] Anemic   Gastrointestinal:  [] Blood in stool   [] Vomiting blood  [] Gastroesophageal reflux/heartburn   [] Abdominal pain Genitourinary:  [x] Chronic kidney disease   [] Difficult urination  [] Frequent urination  [] Burning with urination   [] Hematuria Skin:  [] Rashes   [] Ulcers   [] Wounds Psychological:  [] History of anxiety   []  History of major depression.  Physical Examination  BP (!) 101/54 (BP Location: Right Arm)   Pulse 71   Resp 16   Wt 214 lb (97.1 kg)   BMI 26.05 kg/m  Gen:  WD/WN, NAD Head: [place]/AT, No temporalis wasting. Ear/Nose/Throat: Hearing grossly intact, nares w/o erythema or drainage Eyes: Conjunctiva clear. Sclera non-icteric Neck: Supple.  Trachea midline Pulmonary:  Good air movement, no use of accessory muscles.  Cardiac: RRR, no JVD Vascular:  Vessel Right Left  Radial Palpable Palpable       Musculoskeletal: M/S 5/5 throughout.  No deformity or atrophy.  No significant lower extremity edema. Neurologic: Sensation grossly intact in extremities.  Symmetrical.  Speech is fluent.  Psychiatric: Judgment intact, Mood & affect appropriate for pt's clinical situation. Dermatologic: No rashes or ulcers noted.  No cellulitis or open wounds.      Labs No results found for this or any previous visit (from the past 2160 hour(s)).  Radiology No results found.  Assessment/Plan Benign essential HTN blood pressure control important in reducing the progression of atherosclerotic disease. On appropriate oral medications.     Renal artery stenosis (HCC) Duplex earlier this year shows widely patent right renal artery stent without recurrent stenosis.  Left  renal artery has good velocities with no evidence of significant stenosis on that side either.  Overall doing well.  Continue to check on an annual basis.  Chronic kidney disease, stage III (moderate) Avoid contrast unless absolutely  necessary  Hyperlipidemia lipid control important in reducing the progression of atherosclerotic disease. Continue statin therapy   Bilateral carotid artery stenosis Duplex today shows some mildly elevated velocities bilaterally that would be on the lower end of the 40 to 59% range but no significant recurrent stenosis requiring intervention at this time.  Continue current medical regimen.  Recheck in 6 months.    Leotis Pain, MD  10/05/2020 10:32 AM    This note was created with Dragon medical transcription system.  Any errors from dictation are purely unintentional

## 2020-10-30 ENCOUNTER — Other Ambulatory Visit: Payer: Self-pay | Admitting: Internal Medicine

## 2020-10-30 DIAGNOSIS — J209 Acute bronchitis, unspecified: Secondary | ICD-10-CM

## 2020-10-30 DIAGNOSIS — J4530 Mild persistent asthma, uncomplicated: Secondary | ICD-10-CM

## 2020-10-30 DIAGNOSIS — J440 Chronic obstructive pulmonary disease with acute lower respiratory infection: Secondary | ICD-10-CM

## 2020-11-20 ENCOUNTER — Telehealth: Payer: Self-pay | Admitting: Internal Medicine

## 2020-11-20 NOTE — Telephone Encounter (Signed)
Patient is unable to urinate . No appointments available. Call was transferred to [facility] at [place].

## 2020-11-20 NOTE — Telephone Encounter (Signed)
Patient was instructed to go to ED per access nurse.

## 2020-11-22 NOTE — Telephone Encounter (Signed)
Agree with ED visit will need to f/u urology in the future

## 2020-11-28 ENCOUNTER — Telehealth: Payer: Self-pay | Admitting: Internal Medicine

## 2020-11-28 NOTE — Telephone Encounter (Signed)
error 

## 2020-11-30 ENCOUNTER — Other Ambulatory Visit: Payer: Self-pay

## 2020-11-30 ENCOUNTER — Ambulatory Visit
Admission: EM | Admit: 2020-11-30 | Discharge: 2020-11-30 | Disposition: A | Discharge status: Home / Self Care | Payer: Medicare Other | Attending: Sports Medicine | Admitting: Sports Medicine

## 2020-11-30 ENCOUNTER — Telehealth: Payer: Self-pay | Admitting: Internal Medicine

## 2020-11-30 ENCOUNTER — Telehealth: Payer: Self-pay

## 2020-11-30 DIAGNOSIS — J439 Emphysema, unspecified: Secondary | ICD-10-CM | POA: Diagnosis not present

## 2020-11-30 DIAGNOSIS — Z20822 Contact with and (suspected) exposure to covid-19: Secondary | ICD-10-CM | POA: Insufficient documentation

## 2020-11-30 DIAGNOSIS — R059 Cough, unspecified: Secondary | ICD-10-CM | POA: Diagnosis not present

## 2020-11-30 DIAGNOSIS — R0981 Nasal congestion: Secondary | ICD-10-CM | POA: Diagnosis not present

## 2020-11-30 DIAGNOSIS — J069 Acute upper respiratory infection, unspecified: Secondary | ICD-10-CM

## 2020-11-30 DIAGNOSIS — R509 Fever, unspecified: Secondary | ICD-10-CM

## 2020-11-30 DIAGNOSIS — R062 Wheezing: Secondary | ICD-10-CM

## 2020-11-30 DIAGNOSIS — H9201 Otalgia, right ear: Secondary | ICD-10-CM

## 2020-11-30 LAB — RESP PANEL BY RT-PCR (FLU A&B, COVID) ARPGX2
Influenza A by PCR: NEGATIVE
Influenza B by PCR: NEGATIVE
SARS Coronavirus 2 by RT PCR: NEGATIVE

## 2020-11-30 MED ORDER — FLUTICASONE PROPIONATE 50 MCG/ACT NA SUSP: 2.0000 | Freq: Every day | NASAL | 0 refills | Status: DC

## 2020-11-30 MED ORDER — AZITHROMYCIN 250 MG PO TABS: 250.0000 mg | ORAL_TABLET | Freq: Every day | ORAL | 0 refills | Status: DC

## 2020-11-30 NOTE — Telephone Encounter (Signed)
Providing access nurse documentation.      

## 2020-11-30 NOTE — ED Triage Notes (Signed)
Pt c/o cough, congestion and right ear pain since this past Sunday. Pt states he had a fever 102 also on Sunday, this has improved. Pt is concerned he has a sinus/ear infection. Pt states his wife is currently admitted with pna since this past Sunday.

## 2020-11-30 NOTE — Telephone Encounter (Signed)
Patient informed, Due to the high volume of calls and your symptoms we have to forward your call to our Triage Nurse to expedient your call. Please hold for the transfer.  Patient transferred to [facility] at Access Nurse. Due to  possible sinus infection, had a fever, head congestion, coughing up green/yellow.

## 2020-11-30 NOTE — ED Provider Notes (Signed)
[facility]    CSN: [id number] Arrival date & time: 11/30/20  1015      History   Chief Complaint Chief Complaint  Patient presents with   Cough   Nasal Congestion    HPI Keith Cruz is a 80 y.o. male.   Pleasant 80 year old male who presents for evaluation of about 5 or 6 days of URI type symptoms.  He reports he has had intermittent fevers as high as 102 4 days ago.  He reports that he was 101 yesterday.  He is complaining of cough, nasal congestion, chest congestion, and some ear pressure.  He also complains of a scratchy throat.  Cough is nonproductive.  He denies any substernal chest pain or significant shortness of breath.  He says that he has been diagnosed with some mild asthma in the past but does not take any medicines for it.  He denies any abdominal issues, urinary issues, or joint complaints.  No red flag signs or symptoms elicited on history.   Past Medical History:  Diagnosis Date   Arthritis    back   Asthma    Atherosclerosis of aorta (HCC)    Atherosclerosis of aorta (HCC)    Bronchitis 05/2017   chronic   CAD (coronary artery disease)    Carotid artery disease (HCC)    Carotid stenosis    Carotid stenosis, bilateral    Chronic bronchitis (HCC)    Chronic cough    COPD (chronic obstructive pulmonary disease) (HCC)    Dysrhythmia    GERD (gastroesophageal reflux disease)    Heart murmur    HOH (hard of hearing)    hearing aids   Hypercholesteremia    Hypertension    Kidney disease    stent   Macular degeneration of left eye    Myocardial infarction ([place]) 2016   Pre-diabetes    Seasonal allergies    Seizure ([place])    Seizures ([place])    viral meningitis   Stroke ([place])    1991 and imaging 05/12/15 old strokes   TIA (transient ischemic attack) 1991   x2   Uses hearing aid    Viral meningitis 2017   Viral meningitis    Wears dentures    upper and lower    Patient Active Problem List   Diagnosis Date Noted   Carotid stenosis,  left 05/31/2020   Adjustment reaction 04/12/2020   Benign hypertensive renal disease 04/12/2020   Disorder of kidney and ureter 04/12/2020   History of fall 04/12/2020   Need for immunization against influenza 04/12/2020   Other abnormal glucose 04/12/2020   Rash and other nonspecific skin eruption 04/12/2020   Screening for depression 04/12/2020   Chronic anticoagulation 01/24/2020   History of atrial fibrillation 01/24/2020   S/P carotid endarterectomy 01/24/2020   TIA (transient ischemic attack) 01/24/2020   Prediabetes 01/24/2020   Renovascular hypertension 09/02/2019   Hyperkalemia 01/18/2019   Essential hypertension 01/14/2019   Chronic kidney disease, stage III (moderate) ([place]) 01/14/2019   Emphysema lung ([place]) 09/07/2018   Asbestos exposure 07/02/2018   History of stroke 05/13/2018   Former smoker 05/13/2018   Abnormal findings on diagnostic imaging of lung 05/13/2018   Dizziness 05/11/2018   Elevated MCV 01/06/2017   Pre-diabetes 07/01/2016   Renal artery stenosis ([place]) 06/24/2016   Chronic bronchitis ([place]) 12/10/2015   Asthma, mild persistent 11/19/2015   ED (erectile dysfunction) 08/23/2015   GERD (gastroesophageal reflux disease) 01/22/2015   Insomnia 01/22/2015   Benign essential  HTN 12/28/2014   Carotid artery narrowing 07/13/2014   Coronary artery disease of native artery of native heart with stable angina pectoris ([place]) 07/13/2014   Chronic kidney disease (CKD), stage III (moderate) ([place]) 07/13/2014   Hardening of the aorta (main artery of the heart) ([place]) 07/13/2014   Bilateral carotid artery stenosis 07/13/2014   Atherosclerosis of aorta ([place]) 07/13/2014   Chronic coronary artery disease 07/13/2014   Hyperlipidemia 03/02/2014   Hyperlipidemia with target LDL less than 70 03/02/2014   Atrial fibrillation ([place]) 10/10/2013   S/P CABG x 5 10/07/2013   Presence of aortocoronary bypass graft 10/07/2013    Past Surgical History:  Procedure Laterality Date    CAROTID ENDARTERECTOMY Left 2014   CAROTID ENDARTERECTOMY Right 1991   CAROTID PTA/STENT INTERVENTION Left 05/31/2020   Procedure: CAROTID PTA/STENT INTERVENTIO;  Surgeon: Algernon Huxley, MD;  Location: [facility];  Service: Cardiovascular;  Laterality: Left;   CATARACT EXTRACTION W/PHACO Left 06/24/2017   Procedure: CATARACT EXTRACTION PHACO AND INTRAOCULAR LENS PLACEMENT ([place]) LEFT BORDERLINE DIABETIC;  Surgeon: Leandrew Koyanagi, MD;  Location: [facility];  Service: Ophthalmology;  Laterality: Left;   CATARACT EXTRACTION W/PHACO Right 07/22/2017   Procedure: CATARACT EXTRACTION PHACO AND INTRAOCULAR LENS PLACEMENT ([place]) RIGHT BORDERLINE DIABETIC;  Surgeon: Leandrew Koyanagi, MD;  Location: [facility];  Service: Ophthalmology;  Laterality: Right;   CORONARY ARTERY BYPASS GRAFT     x5   SKIN LESION EXCISION Left    left ear   URETERAL STENT PLACEMENT         Home Medications    Prior to Admission medications   Medication Sig Start Date End Date Taking? Authorizing Provider  ACCU-CHEK AVIVA PLUS test strip USE TO CHECK BLOOD SUGAR ONCE DAILY (ICD-10: R73.03) 05/02/20  Yes McLean-Scocuzza, Nino Glow, MD  aspirin EC 81 MG tablet Take 81 mg by mouth daily.   Yes [provider]  atorvastatin (LIPITOR) 80 MG tablet Take 80 mg by mouth daily. 04/12/20  Yes [provider]  azithromycin (ZITHROMAX) 250 MG tablet Take 1 tablet (250 mg total) by mouth daily. Take first 2 tablets together, then 1 every day until finished. 11/30/20  Yes Verda Cumins, MD  Cholecalciferol (VITAMIN D3) 2000 UNITS capsule Take 2,000 Units by mouth daily. am   Yes [provider]  cyclobenzaprine (FLEXERIL) 5 MG tablet Take 1-2 tablets (5-10 mg total) by mouth at bedtime as needed for muscle spasms. 04/05/19  Yes McLean-Scocuzza, Nino Glow, MD  ELIQUIS 5 MG TABS tablet TAKE 1 TABLET BY MOUTH TWICE A DAY 08/23/20  Yes Kris Hartmann, NP  fexofenadine (ALLEGRA) 180 MG  tablet TAKE 1 TABLET (180 MG TOTAL) BY MOUTH DAILY AS NEEDED FOR ALLERGIES OR RHINITIS. 08/29/20  Yes McLean-Scocuzza, Nino Glow, MD  FLOVENT HFA 110 MCG/ACT inhaler INHALE 2 PUFFS INTO THE LUNGS 2 (TWO) TIMES DAILY. RINSE YOUR MOUTH 10/30/20  Yes McLean-Scocuzza, Nino Glow, MD  fluticasone (FLONASE) 50 MCG/ACT nasal spray Place 2 sprays into both nostrils daily. 11/30/20  Yes Verda Cumins, MD  folic acid (FOLVITE) 1 MG tablet Take 0.8 mg by mouth daily. am   Yes [provider]  hydrALAZINE (APRESOLINE) 50 MG tablet Take 1 tablet (50 mg total) by mouth 3 (three) times daily. 02/14/20  Yes McLean-Scocuzza, Nino Glow, MD  metoprolol tartrate (LOPRESSOR) 50 MG tablet Take 1 tablet (50 mg total) by mouth 2 (two) times daily. D/c coreg pt prefers metoprolol 01/30/20  Yes McLean-Scocuzza, Nino Glow, MD  Multiple  Vitamins-Minerals (CENTRUM SILVER 50+MEN PO) Take by mouth.   Yes [provider]  pantoprazole (PROTONIX) 40 MG tablet Take 1 tablet (40 mg total) by mouth 2 (two) times daily before a meal. TRY TO TAKE ONLY 1X PER DAY INSTEAD OF 2 30 MIN BEFORE A MEAL 01/19/20  Yes McLean-Scocuzza, Nino Glow, MD  PROAIR HFA 108 (90 Base) MCG/ACT inhaler INHALE 1-2 PUFFS INTO THE LUNGS EVERY 4 (FOUR) HOURS AS NEEDED FOR WHEEZING OR SHORTNESS OF BREATH. 06/18/20  Yes McLean-Scocuzza, Nino Glow, MD  SPIRIVA HANDIHALER 18 MCG inhalation capsule 1 capsule daily. 09/11/20  Yes [provider]  benzonatate (TESSALON) 100 MG capsule benzonatate 100 mg capsule  TAKE 1 CAPSULE (100 MG TOTAL) BY MOUTH 3 (THREE) TIMES DAILY AS NEEDED FOR COUGH FOR UP TO 7 DAYS    [provider]  Cyclobenzaprine HCl (CYCLOBENZAPRINE 20 TD) cyclobenzaprine    [provider]  glucose blood test strip E11.9 bid accuchek aviva plus Use as instructed 05/01/20   McLean-Scocuzza, Nino Glow, MD  hydrALAZINE (APRESOLINE) 25 MG tablet TAKE 1 TABLET BY MOUTH THREE TIMES A DAY *STOP TAKING THE '10MG'$ * 06/08/20   McLean-Scocuzza, Nino Glow, MD  lidocaine (LIDODERM) 5 % lidocaine 5 % topical patch  APPLY 1 PATCH BY TOPICAL ROUTE ONCE DAILY (MAY WEAR UP TO 12HOURS.) Patient not taking: Reported on 05/31/2020    [provider]  traZODone (DESYREL) 25 mg TABS tablet trazodone    [provider]    Family History Family History  Problem Relation Age of Onset   Stroke Mother    Heart attack Mother    Dementia Mother    Stroke Father    Stroke Paternal Grandmother    Stroke Paternal Grandfather    Alzheimer's disease Paternal Grandfather    Stroke Sister    Alzheimer's disease Sister    Dementia Sister    Stroke Brother    Alzheimer's disease Maternal Grandmother    Dementia Maternal Grandmother    Dementia Sister     Social History Social History   Tobacco Use   Smoking status: Former    Packs/day: 2.00    Years: 10.00    Pack years: 20.00    Types: Cigarettes    Quit date: 04/28/1970    Years since quitting: 50.6   Smokeless tobacco: Former  Scientific laboratory technician Use: Never used  Substance Use Topics   Alcohol use: No    Alcohol/week: 0.0 standard drinks    Comment: previous   Drug use: No     Allergies   Fexofenadine-pseudoephed er, Nifedipine, Pseudoephedrine, Allegra  [fexofenadine], Anoro ellipta [umeclidinium-vilanterol], Carvedilol, and Prednisone   Review of Systems Review of Systems  Constitutional:  Positive for fever. Negative for activity change, appetite change, chills, diaphoresis and fatigue.  HENT:  Positive for ear pain and hearing loss. Negative for congestion, ear discharge, postnasal drip, rhinorrhea, sinus pressure, sinus pain, sneezing and sore throat.   Eyes:  Negative for pain.  Respiratory:  Positive for cough. Negative for chest tightness and shortness of breath.   Cardiovascular:  Negative for chest pain and palpitations.  Gastrointestinal:  Negative for abdominal pain, diarrhea, nausea and vomiting.  Genitourinary:  Negative for dysuria.  Musculoskeletal:   Negative for back pain, myalgias and neck pain.  Skin:  Negative for color change, pallor, rash and wound.  Neurological:  Negative for dizziness, light-headedness and headaches.  All other systems reviewed and are negative.   Physical Exam Triage Vital  Signs ED Triage Vitals  Enc Vitals Group     BP 11/30/20 1036 (!) 125/97     Pulse Rate 11/30/20 1036 79     Resp 11/30/20 1036 18     Temp 11/30/20 1036 98.2 F (36.8 C)     Temp Source 11/30/20 1036 Oral     SpO2 11/30/20 1036 96 %     Weight 11/30/20 1033 225 lb (102.1 kg)     Height 11/30/20 1033 '6\' 4"'$  (1.93 m)     Head Circumference --      Peak Flow --      Pain Score 11/30/20 1033 5     Pain Loc --      Pain Edu? --      Excl. in [place]? --    No data found.  Updated Vital Signs BP (!) 125/97 (BP Location: Left Arm)   Pulse 79   Temp 98.2 F (36.8 C) (Oral)   Resp 18   Ht '6\' 4"'$  (1.93 m)   Wt 102.1 kg   SpO2 96%   BMI 27.39 kg/m   Visual Acuity Right Eye Distance:   Left Eye Distance:   Bilateral Distance:    Right Eye Near:   Left Eye Near:    Bilateral Near:     Physical Exam Vitals and nursing note reviewed.  Constitutional:      General: He is not in acute distress.    Appearance: Normal appearance. He is not ill-appearing, toxic-appearing or diaphoretic.  HENT:     Head: Normocephalic and atraumatic.     Right Ear: Tympanic membrane normal.     Left Ear: Tympanic membrane normal.     Nose: Congestion present. No rhinorrhea.     Mouth/Throat:     Mouth: Mucous membranes are moist.     Pharynx: No oropharyngeal exudate or posterior oropharyngeal erythema.  Eyes:     General: No scleral icterus.       Right eye: No discharge.        Left eye: No discharge.     Extraocular Movements: Extraocular movements intact.     Conjunctiva/sclera: Conjunctivae normal.     Pupils: Pupils are equal, round, and reactive to light.  Cardiovascular:     Rate and Rhythm: Normal rate and regular rhythm.      Pulses: Normal pulses.     Heart sounds: Normal heart sounds. No murmur heard.   No friction rub. No gallop.  Pulmonary:     Effort: Pulmonary effort is normal.     Breath sounds: No stridor. Wheezing present. No rhonchi or rales.  Musculoskeletal:     Cervical back: Normal range of motion and neck supple.  Skin:    General: Skin is warm and dry.     Capillary Refill: Capillary refill takes less than 2 seconds.     Coloration: Skin is not jaundiced.     Findings: No erythema, lesion or rash.  Neurological:     General: No focal deficit present.     Mental Status: He is alert and oriented to person, place, and time.     UC Treatments / Results  Labs (all labs ordered are listed, but only abnormal results are displayed) Labs Reviewed  RESP PANEL BY RT-PCR (FLU A&B, COVID) [id number]    EKG   Radiology No results found.  Procedures Procedures (including critical care time)  Medications Ordered in UC Medications - No data to display  Initial Impression / Assessment and Plan /  UC Course  I have reviewed the triage vital signs and the nursing notes.  Pertinent labs & imaging results that were available during my care of the patient were reviewed by me and considered in my medical decision making (see chart for details).  Clinical impression: 1.  Cough nonproductive 2.  Nasal congestion 3.  Upper respiratory infection 4.  Wheeze with underlying COPD and pulmonary emphysema 5.  Febrile illness 6.  Right ear pain  Treatment plan: 1.  The findings and treatment plan were discussed in detail with the patient.  Patient was in agreement. 2.  Had a long discussion with him regarding the fact he has an upper respiratory infection.  I believe most of his right ear pain was coming from inflammation from his eustachian tube and his sinuses.  I elected to go ahead and treat him with a nasal steroid.  I also added in an antibiotic given his underlying emphysema and COPD with his cough.   He does have an albuterol inhaler and he has 2 other inhalers for his COPD.  I encouraged him to take his albuterol until he is asymptomatic from his current cough and upper respiratory infection.  He voiced verbal understanding. 3.  I did not prescribe a cough suppressant as I do not want him to develop a pneumonia.  He can use over-the-counter Delsym or Robitussin. 4.  We went ahead and drew a respiratory panel.  COVID and influenza tests were both negative. 5.  I added in Flonase along with a Z-Pak. 6.  Educational handouts provided. 7.  If symptoms persist advised him to call his primary care provider or his lung physician. 8.  If symptoms were to worsen in any way given his underlying comorbidities he should call 911 or go to the ER. 9.  I discharged him in stable condition and will follow-up here as needed.    Final Clinical Impressions(s) / UC Diagnoses   Final diagnoses:  Cough  Nasal congestion  Upper respiratory tract infection, unspecified type  Wheeze  Pulmonary emphysema, unspecified emphysema type (HCC)  Febrile illness  Right ear pain     Discharge Instructions      As we discussed, you have an upper respiratory infection.  I believe your right ear pain is coming from inflammation within your sinuses.  I have elected to treat you with an antibiotic and a nasal steroid. You do have a wheeze on examination, and I would encourage you to continue to use your inhalers.  Please use the albuterol several times a day until you feel better.  This will help open up your lower bronchioles and you will be able to cough some of the mucus up.  I hesitate to give you a cough suppressant as I do not want you to develop a pneumonia. We have also drawn a COVID and influenza test given that you are having intermittent fevers.  Someone will contact you with a positive result.  If you are positive you will need to quarantine and given all of your medical conditions you would be an ideal  candidate for medication for this. Please go to your pharmacy and pick up your medications. Please see educational handouts. If your symptoms worsen then please call 911 and go to the hospital. If your symptoms persist please follow-up with your primary care physician and/or your lung doctor.     ED Prescriptions     Medication Sig Dispense Auth. Provider   azithromycin (ZITHROMAX) 250 MG tablet Take  1 tablet (250 mg total) by mouth daily. Take first 2 tablets together, then 1 every day until finished. 6 tablet Verda Cumins, MD   fluticasone [facility]) 50 MCG/ACT nasal spray Place 2 sprays into both nostrils daily. 15.8 mL Verda Cumins, MD      PDMP not reviewed this encounter.   Verda Cumins, MD 11/30/20 [phone]

## 2020-11-30 NOTE — Discharge Instructions (Addendum)
As we discussed, you have an upper respiratory infection.  I believe your right ear pain is coming from inflammation within your sinuses.  I have elected to treat you with an antibiotic and a nasal steroid. You do have a wheeze on examination, and I would encourage you to continue to use your inhalers.  Please use the albuterol several times a day until you feel better.  This will help open up your lower bronchioles and you will be able to cough some of the mucus up.  I hesitate to give you a cough suppressant as I do not want you to develop a pneumonia. We have also drawn a COVID and influenza test given that you are having intermittent fevers.  Someone will contact you with a positive result.  If you are positive you will need to quarantine and given all of your medical conditions you would be an ideal candidate for medication for this. Please go to your pharmacy and pick up your medications. Please see educational handouts. If your symptoms worsen then please call 911 and go to the hospital. If your symptoms persist please follow-up with your primary care physician and/or your lung doctor.

## 2020-11-30 NOTE — Telephone Encounter (Signed)
Agree with [facility] or ED eval

## 2020-11-30 NOTE — Telephone Encounter (Signed)
Received a call from Keith Cruz at [facility] stating that they had received a call from his wife [name]. [name] states that Keith Cruz is experiencing nasal congestion and a stuffy nose. [name] is currently admitted in the hospital and was able to 3 way call [name] with [facility] on the phone. Pt was instructed to go to Urgent care or a walk in clinic. Pt declined going to Urgent care. Pt states that his temperature was "183". Jeani Hawking states that he showed some confusion. Pt was instructed to go to the hospital and was offered to have EMS be called and evaluate him at home. Pt declined and Pt's wife declined. [name] states that their son is a Airline pilot and they will try to contact the son and see if he can talk to [place].  Attempted to contact Davidson's son and see if he spoke to the patient. Call could not go through. Pt was seen at [facility] in [place] at 10:15am. Awaiting evaluation.

## 2020-12-03 NOTE — Telephone Encounter (Signed)
Spoke with pt's wife and she stated that pt was seen at [facility]. He was prescribed antibiotics, inhaler and cough medication. She stated that he was advised to go to the ED but refused. He is getting better no fever, appetite is coming back, drinking fluids, O2 is hanging around 92% but wife stated that he is still "little weak". Pt has two mare days worth of antibiotics.

## 2020-12-03 NOTE — Telephone Encounter (Signed)
Spoke with pt's wife and informed her of Dr. Audrie Gallus message below. Pt's wife gave a verbal understanding.

## 2020-12-03 NOTE — Telephone Encounter (Signed)
At his age if he is weak still and had fever he needs further care and ED is best O2 should be above 90% at rest and with exertion  Dr. Olivia Mackie McLean-Scocuzza

## 2020-12-17 DIAGNOSIS — I701 Atherosclerosis of renal artery: Secondary | ICD-10-CM | POA: Diagnosis not present

## 2020-12-17 DIAGNOSIS — N1831 Chronic kidney disease, stage 3a: Secondary | ICD-10-CM | POA: Diagnosis not present

## 2020-12-17 DIAGNOSIS — I10 Essential (primary) hypertension: Secondary | ICD-10-CM | POA: Diagnosis not present

## 2020-12-26 DIAGNOSIS — I10 Essential (primary) hypertension: Secondary | ICD-10-CM | POA: Diagnosis not present

## 2020-12-26 DIAGNOSIS — I25118 Atherosclerotic heart disease of native coronary artery with other forms of angina pectoris: Secondary | ICD-10-CM | POA: Diagnosis not present

## 2020-12-26 DIAGNOSIS — I700 Atherosclerosis of aorta: Secondary | ICD-10-CM | POA: Diagnosis not present

## 2020-12-26 DIAGNOSIS — E785 Hyperlipidemia, unspecified: Secondary | ICD-10-CM | POA: Diagnosis not present

## 2020-12-26 DIAGNOSIS — I6523 Occlusion and stenosis of bilateral carotid arteries: Secondary | ICD-10-CM | POA: Diagnosis not present

## 2020-12-26 DIAGNOSIS — I2510 Atherosclerotic heart disease of native coronary artery without angina pectoris: Secondary | ICD-10-CM | POA: Diagnosis not present

## 2021-01-15 ENCOUNTER — Other Ambulatory Visit: Payer: Self-pay | Admitting: Internal Medicine

## 2021-01-15 DIAGNOSIS — K219 Gastro-esophageal reflux disease without esophagitis: Secondary | ICD-10-CM

## 2021-01-15 DIAGNOSIS — R7303 Prediabetes: Secondary | ICD-10-CM

## 2021-01-15 DIAGNOSIS — I10 Essential (primary) hypertension: Secondary | ICD-10-CM

## 2021-01-15 NOTE — Telephone Encounter (Signed)
Nikki from [facility] is calling to request the following medicines for the patient.Please send them to [facility]. 1.pantoprazole (PROTONIX) 40 MG tablet 2.hydrALAZINE (APRESOLINE) 25 MG tablet 3.metoprolol tartrate (LOPRESSOR) 50 MG tablet 4.glucose blood test strip

## 2021-01-15 NOTE — Telephone Encounter (Signed)
pt last seen on 09/02/19 Pt scheduled for an appointment on 02/19/21.  Ok to refill medication?  Pharmacy requested Hyralazine 25mg .  Pt wants medication ordered to [facility] in [place].

## 2021-01-16 MED ORDER — PANTOPRAZOLE SODIUM 40 MG PO TBEC: 40.0000 mg | DELAYED_RELEASE_TABLET | Freq: Two times a day (BID) | ORAL | 0 refills | Status: DC

## 2021-01-16 MED ORDER — METOPROLOL TARTRATE 50 MG PO TABS: 50.0000 mg | ORAL_TABLET | Freq: Two times a day (BID) | ORAL | 0 refills | Status: DC

## 2021-01-16 MED ORDER — ACCU-CHEK AVIVA PLUS VI STRP: ORAL_STRIP | 5 refills | Status: DC

## 2021-01-16 MED ORDER — HYDRALAZINE HCL 25 MG PO TABS: ORAL_TABLET | ORAL | 0 refills | Status: DC

## 2021-01-16 NOTE — Telephone Encounter (Signed)
I refilled medication.  Needs to keep appt for more refills.

## 2021-02-11 ENCOUNTER — Ambulatory Visit (INDEPENDENT_AMBULATORY_CARE_PROVIDER_SITE_OTHER): Payer: Medicare Other

## 2021-02-11 VITALS — BP 116/58 | HR 74 | Temp 97.5°F | Ht 76.0 in | Wt 225.0 lb

## 2021-02-11 DIAGNOSIS — Z0000 Encounter for general adult medical examination without abnormal findings: Secondary | ICD-10-CM | POA: Diagnosis not present

## 2021-02-11 NOTE — Patient Instructions (Addendum)
Keith Cruz , Thank you for taking time to come for your Medicare Wellness Visit. I appreciate your ongoing commitment to your health goals. Please review the following plan we discussed and let me know if I can assist you in the future.   These are the goals we discussed:  Goals      Follow up with Primary Care Provider     As needed Snack healthy        This is a list of the screening recommended for you and due dates:  Health Maintenance  Topic Date Due   COVID-19 Vaccine (3 - Pfizer risk series) 02/27/2021*   Zoster (Shingles) Vaccine (1 of 2) 05/14/2021*   Tetanus Vaccine  07/04/2028   Flu Shot  Completed   HPV Vaccine  Aged Out  *Topic was postponed. The date shown is not the original due date.  Advanced directives: End of life planning; Advance aging; Advanced directives discussed.  Copy of current HCPOA/Living Will requested.    Conditions/risks identified: none new  Next appointment: Follow up in one year for your annual wellness visit.   Preventive Care 60 Years and Older, Male Preventive care refers to lifestyle choices and visits with your health care provider that can promote health and wellness. What does preventive care include? A yearly physical exam. This is also called an annual well check. Dental exams once or twice a year. Routine eye exams. Ask your health care provider how often you should have your eyes checked. Personal lifestyle choices, including: Daily care of your teeth and gums. Regular physical activity. Eating a healthy diet. Avoiding tobacco and drug use. Limiting alcohol use. Practicing safe sex. Taking low doses of aspirin every day. Taking vitamin and mineral supplements as recommended by your health care provider. What happens during an annual well check? The services and screenings done by your health care provider during your annual well check will depend on your age, overall health, lifestyle risk factors, and family history of  disease. Counseling  Your health care provider may ask you questions about your: Alcohol use. Tobacco use. Drug use. Emotional well-being. Home and relationship well-being. Sexual activity. Eating habits. History of falls. Memory and ability to understand (cognition). Work and work Statistician. Screening  You may have the following tests or measurements: Height, weight, and BMI. Blood pressure. Lipid and cholesterol levels. These may be checked every 5 years, or more frequently if you are over 22 years old. Skin check. Lung cancer screening. You may have this screening every year starting at age 80 if you have a 30-pack-year history of smoking and currently smoke or have quit within the past 15 years. Fecal occult blood test (FOBT) of the stool. You may have this test every year starting at age 80. Flexible sigmoidoscopy or colonoscopy. You may have a sigmoidoscopy every 5 years or a colonoscopy every 10 years starting at age 80. Prostate cancer screening. Recommendations will vary depending on your family history and other risks. Hepatitis C blood test. Hepatitis B blood test. Sexually transmitted disease (STD) testing. Diabetes screening. This is done by checking your blood sugar (glucose) after you have not eaten for a while (fasting). You may have this done every 1-3 years. Abdominal aortic aneurysm (AAA) screening. You may need this if you are a current or former smoker. Osteoporosis. You may be screened starting at age 80 if you are at high risk. Talk with your health care provider about your test results, treatment options, and if necessary, the  need for more tests. Vaccines  Your health care provider may recommend certain vaccines, such as: Influenza vaccine. This is recommended every year. Tetanus, diphtheria, and acellular pertussis (Tdap, Td) vaccine. You may need a Td booster every 10 years. Zoster vaccine. You may need this after age 80. Pneumococcal 13-valent  conjugate (PCV13) vaccine. One dose is recommended after age 80. Pneumococcal polysaccharide (PPSV23) vaccine. One dose is recommended after age 80. Talk to your health care provider about which screenings and vaccines you need and how often you need them. This information is not intended to replace advice given to you by your health care provider. Make sure you discuss any questions you have with your health care provider. Document Released: 05/11/2015 Document Revised: 01/02/2016 Document Reviewed: 02/13/2015 Elsevier Interactive Patient Education  2017 [place] Prevention in the Home Falls can cause injuries. They can happen to people of all ages. There are many things you can do to make your home safe and to help prevent falls. What can I do on the outside of my home? Regularly fix the edges of walkways and driveways and fix any cracks. Remove anything that might make you trip as you walk through a door, such as a raised step or threshold. Trim any bushes or trees on the path to your home. Use bright outdoor lighting. Clear any walking paths of anything that might make someone trip, such as rocks or tools. Regularly check to see if handrails are loose or broken. Make sure that both sides of any steps have handrails. Any raised decks and porches should have guardrails on the edges. Have any leaves, snow, or ice cleared regularly. Use sand or salt on walking paths during winter. Clean up any spills in your garage right away. This includes oil or grease spills. What can I do in the bathroom? Use night lights. Install grab bars by the toilet and in the tub and shower. Do not use towel bars as grab bars. Use non-skid mats or decals in the tub or shower. If you need to sit down in the shower, use a plastic, non-slip stool. Keep the floor dry. Clean up any water that spills on the floor as soon as it happens. Remove soap buildup in the tub or shower regularly. Attach bath mats  securely with double-sided non-slip rug tape. Do not have throw rugs and other things on the floor that can make you trip. What can I do in the bedroom? Use night lights. Make sure that you have a light by your bed that is easy to reach. Do not use any sheets or blankets that are too big for your bed. They should not hang down onto the floor. Have a firm chair that has side arms. You can use this for support while you get dressed. Do not have throw rugs and other things on the floor that can make you trip. What can I do in the kitchen? Clean up any spills right away. Avoid walking on wet floors. Keep items that you use a lot in easy-to-reach places. If you need to reach something above you, use a strong step stool that has a grab bar. Keep electrical cords out of the way. Do not use floor polish or wax that makes floors slippery. If you must use wax, use non-skid floor wax. Do not have throw rugs and other things on the floor that can make you trip. What can I do with my stairs? Do not leave any items on the  stairs. Make sure that there are handrails on both sides of the stairs and use them. Fix handrails that are broken or loose. Make sure that handrails are as long as the stairways. Check any carpeting to make sure that it is firmly attached to the stairs. Fix any carpet that is loose or worn. Avoid having throw rugs at the top or bottom of the stairs. If you do have throw rugs, attach them to the floor with carpet tape. Make sure that you have a light switch at the top of the stairs and the bottom of the stairs. If you do not have them, ask someone to add them for you. What else can I do to help prevent falls? Wear shoes that: Do not have high heels. Have rubber bottoms. Are comfortable and fit you well. Are closed at the toe. Do not wear sandals. If you use a stepladder: Make sure that it is fully opened. Do not climb a closed stepladder. Make sure that both sides of the stepladder  are locked into place. Ask someone to hold it for you, if possible. Clearly mark and make sure that you can see: Any grab bars or handrails. First and last steps. Where the edge of each step is. Use tools that help you move around (mobility aids) if they are needed. These include: Canes. Walkers. Scooters. Crutches. Turn on the lights when you go into a dark area. Replace any light bulbs as soon as they burn out. Set up your furniture so you have a clear path. Avoid moving your furniture around. If any of your floors are uneven, fix them. If there are any pets around you, be aware of where they are. Review your medicines with your doctor. Some medicines can make you feel dizzy. This can increase your chance of falling. Ask your doctor what other things that you can do to help prevent falls. This information is not intended to replace advice given to you by your health care provider. Make sure you discuss any questions you have with your health care provider. Document Released: 02/08/2009 Document Revised: 09/20/2015 Document Reviewed: 05/19/2014 Elsevier Interactive Patient Education  2017 [employer].

## 2021-02-11 NOTE — Progress Notes (Signed)
Subjective:   MARKEVIUS TROMBETTA is a 80 y.o. male who presents for Medicare Annual/Subsequent preventive examination.  Review of Systems    No ROS.  Medicare Wellness Virtual Visit.  Visual/audio telehealth visit, UTA vital signs.   See social history for additional risk factors.   Cardiac Risk Factors include: advanced age (>73men, >47 women);male gender;hypertension     Objective:    Today's Vitals   02/11/21 0904  BP: (!) 116/58  Pulse: 74  Temp: (!) 97.5 F (36.4 C)  SpO2: 96%  Weight: 225 lb (102.1 kg)  Height: 6\' 4"  (1.93 m)   Body mass index is 27.39 kg/m.  Advanced Directives 02/11/2021 05/31/2020 02/09/2020 07/17/2019 02/08/2019 01/19/2018 07/22/2017  Does Patient Have a Medical Advance Directive? Yes No Yes No Yes Yes Yes  Type of Paramedic of [place];Living will - [place];Living will - [place];Living will Living will;Healthcare Power of [place];Living will  Does patient want to make changes to medical advance directive? No - Patient declined - No - Patient declined - No - Patient declined - -  Copy of [place] in Chart? No - copy requested - No - copy requested - No - copy requested No - copy requested Yes  Would patient like information on creating a medical advance directive? - No - Patient declined - - - - -    Current Medications (verified) Outpatient Encounter Medications as of 02/11/2021  Medication Sig   aspirin EC 81 MG tablet Take 81 mg by mouth daily.   atorvastatin (LIPITOR) 80 MG tablet Take 80 mg by mouth daily.   Cholecalciferol (VITAMIN D3) 2000 UNITS capsule Take 2,000 Units by mouth daily. am   cyclobenzaprine (FLEXERIL) 5 MG tablet Take 1-2 tablets (5-10 mg total) by mouth at bedtime as needed for muscle spasms.   ELIQUIS 5 MG TABS tablet TAKE 1 TABLET BY MOUTH TWICE A DAY   fexofenadine (ALLEGRA) 180 MG tablet TAKE 1 TABLET (180 MG  TOTAL) BY MOUTH DAILY AS NEEDED FOR ALLERGIES OR RHINITIS.   FLOVENT HFA 110 MCG/ACT inhaler INHALE 2 PUFFS INTO THE LUNGS 2 (TWO) TIMES DAILY. RINSE YOUR MOUTH   fluticasone (FLONASE) 50 MCG/ACT nasal spray Place 2 sprays into both nostrils daily.   folic acid (FOLVITE) 1 MG tablet Take 0.8 mg by mouth daily. am   glucose blood (ACCU-CHEK AVIVA PLUS) test strip USE TO CHECK BLOOD SUGAR ONCE DAILY (ICD-10: R73.03)   hydrALAZINE (APRESOLINE) 50 MG tablet Take 1 tablet (50 mg total) by mouth 3 (three) times daily.   metoprolol tartrate (LOPRESSOR) 50 MG tablet Take 1 tablet (50 mg total) by mouth 2 (two) times daily. D/c coreg pt prefers metoprolol   Multiple Vitamins-Minerals (CENTRUM SILVER 50+MEN PO) Take by mouth.   pantoprazole (PROTONIX) 40 MG tablet Take 1 tablet (40 mg total) by mouth 2 (two) times daily before a meal. TRY TO TAKE ONLY 1X PER DAY INSTEAD OF 2 30 MIN BEFORE A MEAL   PROAIR HFA 108 (90 Base) MCG/ACT inhaler INHALE 1-2 PUFFS INTO THE LUNGS EVERY 4 (FOUR) HOURS AS NEEDED FOR WHEEZING OR SHORTNESS OF BREATH.   SPIRIVA HANDIHALER 18 MCG inhalation capsule 1 capsule daily.   [DISCONTINUED] azithromycin (ZITHROMAX) 250 MG tablet Take 1 tablet (250 mg total) by mouth daily. Take first 2 tablets together, then 1 every day until finished.   [DISCONTINUED] benzonatate (TESSALON) 100 MG capsule benzonatate 100 mg capsule  TAKE 1 CAPSULE (100 MG TOTAL) BY MOUTH 3 (THREE) TIMES DAILY AS NEEDED FOR COUGH FOR UP TO 7 DAYS   [DISCONTINUED] Cyclobenzaprine HCl (CYCLOBENZAPRINE 20 TD) cyclobenzaprine   [DISCONTINUED] hydrALAZINE (APRESOLINE) 25 MG tablet TAKE 1 TABLET BY MOUTH THREE TIMES A DAY *STOP TAKING THE 10MG *   [DISCONTINUED] lidocaine (LIDODERM) 5 % lidocaine 5 % topical patch  APPLY 1 PATCH BY TOPICAL ROUTE ONCE DAILY (MAY WEAR UP TO 12HOURS.) (Patient not taking: Reported on 05/31/2020)   [DISCONTINUED] traZODone (DESYREL) 25 mg TABS tablet trazodone   Facility-Administered Encounter  Medications as of 02/11/2021  Medication   ipratropium-albuterol (DUONEB) 0.5-2.5 (3) MG/3ML nebulizer solution 3 mL    Allergies (verified) Fexofenadine-pseudoephed er, Nifedipine, Pseudoephedrine, Allegra  [fexofenadine], Anoro ellipta [umeclidinium-vilanterol], Carvedilol, and Prednisone   History: Past Medical History:  Diagnosis Date   Arthritis    back   Asthma    Atherosclerosis of aorta (HCC)    Atherosclerosis of aorta ([place])    Bronchitis 05/2017   chronic   CAD (coronary artery disease)    Carotid artery disease (HCC)    Carotid stenosis    Carotid stenosis, bilateral    Chronic bronchitis (HCC)    Chronic cough    COPD (chronic obstructive pulmonary disease) (HCC)    Dysrhythmia    GERD (gastroesophageal reflux disease)    Heart murmur    HOH (hard of hearing)    hearing aids   Hypercholesteremia    Hypertension    Kidney disease    stent   Macular degeneration of left eye    Myocardial infarction ([place]) 2016   Pre-diabetes    Seasonal allergies    Seizure ([place])    Seizures ([place])    viral meningitis   Stroke ([place])    1991 and imaging 05/12/15 old strokes   TIA (transient ischemic attack) 1991   x2   Uses hearing aid    Viral meningitis 2017   Viral meningitis    Wears dentures    upper and lower   Past Surgical History:  Procedure Laterality Date   CAROTID ENDARTERECTOMY Left 2014   CAROTID ENDARTERECTOMY Right 1991   CAROTID PTA/STENT INTERVENTION Left 05/31/2020   Procedure: CAROTID PTA/STENT INTERVENTIO;  Surgeon: Algernon Huxley, MD;  Location: [facility];  Service: Cardiovascular;  Laterality: Left;   CATARACT EXTRACTION W/PHACO Left 06/24/2017   Procedure: CATARACT EXTRACTION PHACO AND INTRAOCULAR LENS PLACEMENT ([place]) LEFT BORDERLINE DIABETIC;  Surgeon: Leandrew Koyanagi, MD;  Location: [facility];  Service: Ophthalmology;  Laterality: Left;   CATARACT EXTRACTION W/PHACO Right 07/22/2017   Procedure: CATARACT EXTRACTION PHACO  AND INTRAOCULAR LENS PLACEMENT ([place]) RIGHT BORDERLINE DIABETIC;  Surgeon: Leandrew Koyanagi, MD;  Location: [facility];  Service: Ophthalmology;  Laterality: Right;   CORONARY ARTERY BYPASS GRAFT     x5   SKIN LESION EXCISION Left    left ear   URETERAL STENT PLACEMENT     Family History  Problem Relation Age of Onset   Stroke Mother    Heart attack Mother    Dementia Mother    Stroke Father    Stroke Paternal Grandmother    Stroke Paternal Grandfather    Alzheimer's disease Paternal Grandfather    Stroke Sister    Alzheimer's disease Sister    Dementia Sister    Stroke Brother    Alzheimer's disease Maternal Grandmother    Dementia Maternal Grandmother    Dementia Sister    Social History   Socioeconomic  History   Marital status: Married    Spouse name: Not on file   Number of children: Not on file   Years of education: Not on file   Highest education level: 10th grade  Occupational History   Not on file  Tobacco Use   Smoking status: Former    Packs/day: 2.00    Years: 10.00    Pack years: 20.00    Types: Cigarettes    Quit date: 04/28/1970    Years since quitting: 50.8   Smokeless tobacco: Former  Scientific laboratory technician Use: Never used  Substance and Sexual Activity   Alcohol use: No    Alcohol/week: 0.0 standard drinks    Comment: previous   Drug use: No   Sexual activity: Yes    Comment: wife   Other Topics Concern   Not on file  Social History Narrative   Re-married x 3 years    Retired    Chartered loss adjuster, wears seat belt, safe in relationship    2 sons and 1 daughter    Social Determinants of Radio broadcast assistant Strain: Low Risk    Difficulty of Paying Living Expenses: Not hard at all  Food Insecurity: No Food Insecurity   Worried About Charity fundraiser in the Last Year: Never true   Arboriculturist in the Last Year: Never true  Transportation Needs: No Transportation Needs   Lack of Transportation (Medical): No   Lack of  Transportation (Non-Medical): No  Physical Activity: Not on file  Stress: No Stress Concern Present   Feeling of Stress : Not at all  Social Connections: Moderately Integrated   Frequency of Communication with Friends and Family: More than three times a week   Frequency of Social Gatherings with Friends and Family: More than three times a week   Attends Religious Services: More than 4 times per year   Active Member of [employer] or Organizations: No   Attends Music therapist: Never   Marital Status: Married    Tobacco Counseling Counseling given: Not Answered   Clinical Intake:  Pre-visit preparation completed: Yes        Diabetes: No  How often do you need to have someone help you when you read instructions, pamphlets, or other written materials from your doctor or pharmacy?: 1 - Never   Interpreter Needed?: No      Activities of Daily Living In your present state of health, do you have any difficulty performing the following activities: 02/11/2021 05/31/2020  Hearing? Y Y  Comment Hearing aids -  Vision? N N  Difficulty concentrating or making decisions? N N  Walking or climbing stairs? N N  Dressing or bathing? N N  Doing errands, shopping? N N  Preparing Food and eating ? N -  Using the Toilet? N -  In the past six months, have you accidently leaked urine? N -  Do you have problems with loss of bowel control? N -  Managing your Medications? N -  Managing your Finances? Y -  Comment Son manages -  Housekeeping or managing your Housekeeping? N -  Some recent data might be hidden    Patient Care Team: McLean-Scocuzza, Nino Glow, MD as PCP - General (Internal Medicine)  Indicate any recent Medical Services you may have received from other than [facility] providers in the past year (date may be approximate).     Assessment:   This is a routine wellness examination for  [place].  I connected with Krish today by telephone and verified that I am speaking with  the correct person using two identifiers. Location patient: home Location provider: work Persons participating in the virtual visit: patient, wife, Marine scientist.    I discussed the limitations, risks, security and privacy concerns of performing an evaluation and management service by telephone and the availability of in person appointments. The patient expressed understanding and verbally consented to this telephonic visit.    Interactive audio and video telecommunications were attempted between this provider and patient, however failed, due to patient having technical difficulties OR patient did not have access to video capability.  We continued and completed visit with audio only.  Some vital signs may be absent or patient reported.   Hearing/Vision screen Hearing Screening - Comments:: Hearing aid, bilateral  Vision Screening - Comments:: Wears corrective lenses They have seen their ophthalmologist in the last 12 months.  Dry Macular degeneration   Dietary issues and exercise activities discussed: Current Exercise Habits: Home exercise routine, Type of exercise: strength training/weights;walking, Frequency (Times/Week): 5, Intensity: Mild Regular diet Good water intake   Goals Addressed             This Visit's Progress    Follow up with Primary Care Provider       As needed Snack healthy       Depression Screen PHQ 2/9 Scores 02/11/2021 02/09/2020 09/02/2019 06/01/2019 02/08/2019 01/20/2019 03/09/2018  PHQ - 2 Score 0 0 0 0 0 0 0  PHQ- 9 Score - - - 0 - - -    Fall Risk Fall Risk  02/11/2021 02/09/2020 09/02/2019 06/01/2019 02/08/2019  Falls in the past year? 0 0 0 0 0  Number falls in past yr: 0 0 0 0 -  Injury with Fall? - - 0 0 -  Follow up Falls evaluation completed Falls evaluation completed Falls evaluation completed Falls evaluation completed -    FALL RISK PREVENTION PERTAINING TO THE HOME: Adequate lighting in your home to reduce risk of falls? Yes   ASSISTIVE DEVICES  UTILIZED TO PREVENT FALLS: Use of a cane, walker or w/c? No   TIMED UP AND GO: Was the test performed? No .   Cognitive Function:  Patient is alert and oriented x3.    6CIT Screen 02/11/2021 02/08/2019 01/19/2018 12/09/2016  What Year? 0 points 0 points 0 points 0 points  What month? 0 points 0 points 0 points 0 points  What time? 0 points 0 points 0 points 0 points  Count back from 20 - 0 points 0 points 0 points  Months in reverse 0 points 0 points 0 points 0 points  Repeat phrase - 0 points 2 points 2 points  Total Score - 0 2 2    Immunizations Immunization History  Administered Date(s) Administered   Influenza, High Dose Seasonal PF 01/22/2015, 12/31/2017, 12/15/2018, 12/15/2019, 12/27/2020   Influenza-Unspecified 02/23/2015, 01/03/2017, 12/31/2017, 12/15/2018   PFIZER(Purple Top)SARS-COV-2 Vaccination 06/27/2019, 07/18/2019   Pneumococcal Conjugate-13 12/10/2015   Pneumococcal Polysaccharide-23 05/11/2018   Tdap 07/05/2018   Shingrix Completed?: No.    Education has been provided regarding the importance of this vaccine. Patient has been advised to call insurance company to determine out of pocket expense if they have not yet received this vaccine. Advised may also receive vaccine at local pharmacy or Health Dept. Verbalized acceptance and understanding.  Health Maintenance Health Maintenance  Topic Date Due   COVID-19 Vaccine (3 - Pfizer risk series) 02/27/2021 (Originally 08/15/2019)  Zoster Vaccines- Shingrix (1 of 2) 05/14/2021 (Originally 06/04/1959)   TETANUS/TDAP  07/04/2028   INFLUENZA VACCINE  Completed   HPV VACCINES  Aged Out   Lung Cancer Screening: (Low Dose CT Chest recommended if Age 33-80 years, 30 pack-year currently smoking OR have quit w/in 15years.) does not qualify.   Vision Screening: Recommended annual ophthalmology exams for early detection of glaucoma and other disorders of the eye.  Dental Screening: Recommended annual dental exams for proper  oral hygiene. Partial.  Community Resource Referral / Chronic Care Management: CRR required this visit?  No   CCM required this visit?  No      Plan:   Keep all routine maintenance appointments.   I have personally reviewed and noted the following in the patient's chart:   Medical and social history Use of alcohol, tobacco or illicit drugs  Current medications and supplements including opioid prescriptions. Patient is not currently taking opioid prescriptions. Functional ability and status Nutritional status Physical activity Advanced directives List of other physicians Hospitalizations, surgeries, and ER visits in previous 12 months Vitals Screenings to include cognitive, depression, and falls Referrals and appointments  In addition, I have reviewed and discussed with patient certain preventive protocols, quality metrics, and best practice recommendations. A written personalized care plan for preventive services as well as general preventive health recommendations were provided to patient via mychart.     Varney Biles, LPN   [id number]

## 2021-02-19 ENCOUNTER — Ambulatory Visit (INDEPENDENT_AMBULATORY_CARE_PROVIDER_SITE_OTHER): Payer: Medicare Other

## 2021-02-19 ENCOUNTER — Other Ambulatory Visit: Payer: Self-pay

## 2021-02-19 ENCOUNTER — Encounter: Payer: Self-pay | Admitting: Internal Medicine

## 2021-02-19 ENCOUNTER — Ambulatory Visit (INDEPENDENT_AMBULATORY_CARE_PROVIDER_SITE_OTHER): Payer: Medicare Other | Admitting: Internal Medicine

## 2021-02-19 VITALS — BP 126/66 | HR 60 | Temp 98.0°F | Ht 76.0 in | Wt 219.2 lb

## 2021-02-19 DIAGNOSIS — J209 Acute bronchitis, unspecified: Secondary | ICD-10-CM

## 2021-02-19 DIAGNOSIS — J4530 Mild persistent asthma, uncomplicated: Secondary | ICD-10-CM | POA: Diagnosis not present

## 2021-02-19 DIAGNOSIS — R7303 Prediabetes: Secondary | ICD-10-CM | POA: Diagnosis not present

## 2021-02-19 DIAGNOSIS — M5441 Lumbago with sciatica, right side: Secondary | ICD-10-CM | POA: Diagnosis not present

## 2021-02-19 DIAGNOSIS — G8929 Other chronic pain: Secondary | ICD-10-CM | POA: Diagnosis not present

## 2021-02-19 DIAGNOSIS — J440 Chronic obstructive pulmonary disease with acute lower respiratory infection: Secondary | ICD-10-CM

## 2021-02-19 DIAGNOSIS — R600 Localized edema: Secondary | ICD-10-CM

## 2021-02-19 DIAGNOSIS — M545 Low back pain, unspecified: Secondary | ICD-10-CM | POA: Diagnosis not present

## 2021-02-19 DIAGNOSIS — Z13220 Encounter for screening for lipoid disorders: Secondary | ICD-10-CM

## 2021-02-19 DIAGNOSIS — K219 Gastro-esophageal reflux disease without esophagitis: Secondary | ICD-10-CM

## 2021-02-19 DIAGNOSIS — I480 Paroxysmal atrial fibrillation: Secondary | ICD-10-CM

## 2021-02-19 DIAGNOSIS — R7989 Other specified abnormal findings of blood chemistry: Secondary | ICD-10-CM

## 2021-02-19 DIAGNOSIS — J441 Chronic obstructive pulmonary disease with (acute) exacerbation: Secondary | ICD-10-CM | POA: Diagnosis not present

## 2021-02-19 DIAGNOSIS — Z1329 Encounter for screening for other suspected endocrine disorder: Secondary | ICD-10-CM | POA: Diagnosis not present

## 2021-02-19 DIAGNOSIS — R0600 Dyspnea, unspecified: Secondary | ICD-10-CM

## 2021-02-19 DIAGNOSIS — I10 Essential (primary) hypertension: Secondary | ICD-10-CM

## 2021-02-19 MED ORDER — FEXOFENADINE HCL 180 MG PO TABS: 180.0000 mg | ORAL_TABLET | Freq: Every day | ORAL | 3 refills | Status: DC | PRN

## 2021-02-19 MED ORDER — LEVOFLOXACIN 750 MG PO TABS: 750.0000 mg | ORAL_TABLET | ORAL | 0 refills | Status: DC

## 2021-02-19 MED ORDER — ATORVASTATIN CALCIUM 80 MG PO TABS: 80.0000 mg | ORAL_TABLET | Freq: Every day | ORAL | 3 refills | Status: DC

## 2021-02-19 MED ORDER — FLUTICASONE PROPIONATE HFA 110 MCG/ACT IN AERO: 2.0000 | INHALATION_SPRAY | Freq: Two times a day (BID) | RESPIRATORY_TRACT | 12 refills | Status: DC

## 2021-02-19 MED ORDER — ALBUTEROL SULFATE HFA 108 (90 BASE) MCG/ACT IN AERS: INHALATION_SPRAY | RESPIRATORY_TRACT | 12 refills | Status: DC

## 2021-02-19 MED ORDER — METOPROLOL TARTRATE 50 MG PO TABS: 50.0000 mg | ORAL_TABLET | Freq: Two times a day (BID) | ORAL | 3 refills | Status: DC

## 2021-02-19 MED ORDER — PANTOPRAZOLE SODIUM 40 MG PO TBEC: 40.0000 mg | DELAYED_RELEASE_TABLET | Freq: Two times a day (BID) | ORAL | 3 refills | Status: DC

## 2021-02-19 MED ORDER — HYDRALAZINE HCL 50 MG PO TABS: 50.0000 mg | ORAL_TABLET | Freq: Three times a day (TID) | ORAL | 3 refills | Status: DC

## 2021-02-19 NOTE — Patient Instructions (Addendum)
Mucinex dm green label   Low Back Sprain or Strain Rehab Ask your health care provider which exercises are safe for you. Do exercises exactly as told by your health care provider and adjust them as directed. It is normal to feel mild stretching, pulling, tightness, or discomfort as you do these exercises. Stop right away if you feel sudden pain or your pain gets worse. Do not begin these exercises until told by your health care provider. Stretching and range-of-motion exercises These exercises warm up your muscles and joints and improve the movement and flexibility of your back. These exercises also help to relieve pain, numbness, and tingling. Lumbar rotation  Lie on your back on a firm bed or the floor with your knees bent. Straighten your arms out to your sides so each arm forms a 90-degree angle (right angle) with a side of your body. Slowly move (rotate) both of your knees to one side of your body until you feel a stretch in your lower back (lumbar). Try not to let your shoulders lift off the floor. Hold this position for __________ seconds. Tense your abdominal muscles and slowly move your knees back to the starting position. Repeat this exercise on the other side of your body. Repeat __________ times. Complete this exercise __________ times a day. Single knee to chest  Lie on your back on a firm bed or the floor with both legs straight. Bend one of your knees. Use your hands to move your knee up toward your chest until you feel a gentle stretch in your lower back and buttock. Hold your leg in this position by holding on to the front of your knee. Keep your other leg as straight as possible. Hold this position for __________ seconds. Slowly return to the starting position. Repeat with your other leg. Repeat __________ times. Complete this exercise __________ times a day. Prone extension on elbows  Lie on your abdomen on a firm bed or the floor (prone position). Prop yourself up on  your elbows. Use your arms to help lift your chest up until you feel a gentle stretch in your abdomen and your lower back. This will place some of your body weight on your elbows. If this is uncomfortable, try stacking pillows under your chest. Your hips should stay down, against the surface that you are lying on. Keep your hip and back muscles relaxed. Hold this position for __________ seconds. Slowly relax your upper body and return to the starting position. Repeat __________ times. Complete this exercise __________ times a day. Strengthening exercises These exercises build strength and endurance in your back. Endurance is the ability to use your muscles for a long time, even after they get tired. Pelvic tilt This exercise strengthens the muscles that lie deep in the abdomen. Lie on your back on a firm bed or the floor with your legs extended. Bend your knees so they are pointing toward the ceiling and your feet are flat on the floor. Tighten your lower abdominal muscles to press your lower back against the floor. This motion will tilt your pelvis so your tailbone points up toward the ceiling instead of pointing to your feet or the floor. To help with this exercise, you may place a small towel under your lower back and try to push your back into the towel. Hold this position for __________ seconds. Let your muscles relax completely before you repeat this exercise. Repeat __________ times. Complete this exercise __________ times a day. Alternating arm and leg  raises  Get on your hands and knees on a firm surface. If you are on a hard floor, you may want to use padding, such as an exercise mat, to cushion your knees. Line up your arms and legs. Your hands should be directly below your shoulders, and your knees should be directly below your hips. Lift your left leg behind you. At the same time, raise your right arm and straighten it in front of you. Do not lift your leg higher than your  hip. Do not lift your arm higher than your shoulder. Keep your abdominal and back muscles tight. Keep your hips facing the ground. Do not arch your back. Keep your balance carefully, and do not hold your breath. Hold this position for __________ seconds. Slowly return to the starting position. Repeat with your right leg and your left arm. Repeat __________ times. Complete this exercise __________ times a day. Abdominal set with straight leg raise  Lie on your back on a firm bed or the floor. Bend one of your knees and keep your other leg straight. Tense your abdominal muscles and lift your straight leg up, 4-6 inches (10-15 cm) off the ground. Keep your abdominal muscles tight and hold this position for __________ seconds. Do not hold your breath. Do not arch your back. Keep it flat against the ground. Keep your abdominal muscles tense as you slowly lower your leg back to the starting position. Repeat with your other leg. Repeat __________ times. Complete this exercise __________ times a day. Single leg lower with bent knees Lie on your back on a firm bed or the floor. Tense your abdominal muscles and lift your feet off the floor, one foot at a time, so your knees and hips are bent in 90-degree angles (right angles). Your knees should be over your hips and your lower legs should be parallel to the floor. Keeping your abdominal muscles tense and your knee bent, slowly lower one of your legs so your toe touches the ground. Lift your leg back up to return to the starting position. Do not hold your breath. Do not let your back arch. Keep your back flat against the ground. Repeat with your other leg. Repeat __________ times. Complete this exercise __________ times a day. Posture and body mechanics Good posture and healthy body mechanics can help to relieve stress in your body's tissues and joints. Body mechanics refers to the movements and positions of your body while you do your daily  activities. Posture is part of body mechanics. Good posture means: Your spine is in its natural S-curve position (neutral). Your shoulders are pulled back slightly. Your head is not tipped forward (neutral). Follow these guidelines to improve your posture and body mechanics in your everyday activities. Standing  When standing, keep your spine neutral and your feet about hip-width apart. Keep a slight bend in your knees. Your ears, shoulders, and hips should line up. When you do a task in which you stand in one place for a long time, place one foot up on a stable object that is 2-4 inches (5-10 cm) high, such as a footstool. This helps keep your spine neutral. Sitting  When sitting, keep your spine neutral and keep your feet flat on the floor. Use a footrest, if necessary, and keep your thighs parallel to the floor. Avoid rounding your shoulders, and avoid tilting your head forward. When working at a desk or a computer, keep your desk at a height where your hands are slightly  lower than your elbows. Slide your chair under your desk so you are close enough to maintain good posture. When working at a computer, place your monitor at a height where you are looking straight ahead and you do not have to tilt your head forward or downward to look at the screen. Resting When lying down and resting, avoid positions that are most painful for you. If you have pain with activities such as sitting, bending, stooping, or squatting, lie in a position in which your body does not bend very much. For example, avoid curling up on your side with your arms and knees near your chest (fetal position). If you have pain with activities such as standing for a long time or reaching with your arms, lie with your spine in a neutral position and bend your knees slightly. Try the following positions: Lying on your side with a pillow between your knees. Lying on your back with a pillow under your knees. Lifting  When lifting  objects, keep your feet at least shoulder-width apart and tighten your abdominal muscles. Bend your knees and hips and keep your spine neutral. It is important to lift using the strength of your legs, not your back. Do not lock your knees straight out. Always ask for help to lift heavy or awkward objects. This information is not intended to replace advice given to you by your health care provider. Make sure you discuss any questions you have with your health care provider. Document Revised: 07/02/2020 Document Reviewed: 07/02/2020 [employer] Patient Education  [place].  Back Exercises The following exercises strengthen the muscles that help to support the trunk (torso) and back. They also help to keep the lower back flexible. Doing these exercises can help to prevent or lessen existing low back pain. If you have back pain or discomfort, try doing these exercises 2-3 times each day or as told by your health care provider. As your pain improves, do them once each day, but increase the number of times that you repeat the steps for each exercise (do more repetitions). To prevent the recurrence of back pain, continue to do these exercises once each day or as told by your health care provider. Do exercises exactly as told by your health care provider and adjust them as directed. It is normal to feel mild stretching, pulling, tightness, or discomfort as you do these exercises, but you should stop right away if you feel sudden pain or your pain gets worse. Exercises Single knee to chest Repeat these steps 3-5 times for each leg: Lie on your back on a firm bed or the floor with your legs extended. Bring one knee to your chest. Your other leg should stay extended and in contact with the floor. Hold your knee in place by grabbing your knee or thigh with both hands and hold. Pull on your knee until you feel a gentle stretch in your lower back or buttocks. Hold the stretch for 10-30 seconds. Slowly  release and straighten your leg. Pelvic tilt Repeat these steps 5-10 times: Lie on your back on a firm bed or the floor with your legs extended. Bend your knees so they are pointing toward the ceiling and your feet are flat on the floor. Tighten your lower abdominal muscles to press your lower back against the floor. This motion will tilt your pelvis so your tailbone points up toward the ceiling instead of pointing to your feet or the floor. With gentle tension and even breathing, hold  this position for 5-10 seconds. Cat-cow Repeat these steps until your lower back becomes more flexible: Get into a hands-and-knees position on a firm bed or the floor. Keep your hands under your shoulders, and keep your knees under your hips. You may place padding under your knees for comfort. Let your head hang down toward your chest. Contract your abdominal muscles and point your tailbone toward the floor so your lower back becomes rounded like the back of a cat. Hold this position for 5 seconds. Slowly lift your head, let your abdominal muscles relax, and point your tailbone up toward the ceiling so your back forms a sagging arch like the back of a cow. Hold this position for 5 seconds.  Press-ups Repeat these steps 5-10 times: Lie on your abdomen (face-down) on a firm bed or the floor. Place your palms near your head, about shoulder-width apart. Keeping your back as relaxed as possible and keeping your hips on the floor, slowly straighten your arms to raise the top half of your body and lift your shoulders. Do not use your back muscles to raise your upper torso. You may adjust the placement of your hands to make yourself more comfortable. Hold this position for 5 seconds while you keep your back relaxed. Slowly return to lying flat on the floor.  Bridges Repeat these steps 10 times: Lie on your back on a firm bed or the floor. Bend your knees so they are pointing toward the ceiling and your feet are flat  on the floor. Your arms should be flat at your sides, next to your body. Tighten your buttocks muscles and lift your buttocks off the floor until your waist is at almost the same height as your knees. You should feel the muscles working in your buttocks and the back of your thighs. If you do not feel these muscles, slide your feet 1-2 inches (2.5-5 cm) farther away from your buttocks. Hold this position for 3-5 seconds. Slowly lower your hips to the starting position, and allow your buttocks muscles to relax completely. If this exercise is too easy, try doing it with your arms crossed over your chest. Abdominal crunches Repeat these steps 5-10 times: Lie on your back on a firm bed or the floor with your legs extended. Bend your knees so they are pointing toward the ceiling and your feet are flat on the floor. Cross your arms over your chest. Tip your chin slightly toward your chest without bending your neck. Tighten your abdominal muscles and slowly raise your torso high enough to lift your shoulder blades a tiny bit off the floor. Avoid raising your torso higher than that because it can put too much stress on your lower back and does not help to strengthen your abdominal muscles. Slowly return to your starting position. Back lifts Repeat these steps 5-10 times: Lie on your abdomen (face-down) with your arms at your sides, and rest your forehead on the floor. Tighten the muscles in your legs and your buttocks. Slowly lift your chest off the floor while you keep your hips pressed to the floor. Keep the back of your head in line with the curve in your back. Your eyes should be looking at the floor. Hold this position for 3-5 seconds. Slowly return to your starting position. Contact a health care provider if: Your back pain or discomfort gets much worse when you do an exercise. Your worsening back pain or discomfort does not lessen within 2 hours after you exercise. If  you have any of these  problems, stop doing these exercises right away. Do not do them again unless your health care provider says that you can. Get help right away if: You develop sudden, severe back pain. If this happens, stop doing the exercises right away. Do not do them again unless your health care provider says that you can. This information is not intended to replace advice given to you by your health care provider. Make sure you discuss any questions you have with your health care provider. Document Revised: 06/27/2020 Document Reviewed: 06/27/2020 [employer] Patient Education  [place].

## 2021-02-19 NOTE — Progress Notes (Addendum)
Chief Complaint  Patient presents with   Follow-up   F/u with wife  1. Htn controlled on hydralazine 50 mg tid lopressor 50 bid 2. S/p CEA right and stent left carotid doing well on lipitor 80 mg qhs  3. C/o low back pain 8/10 at times rad to right foot  4. Cough with green mucous with h/o copd on proair, flovent, spiriva and had azm 11/2020 w/o help    Review of Systems  Constitutional:  Negative for weight loss.  HENT:  Negative for hearing loss.   Eyes:  Negative for blurred vision.  Respiratory:  Positive for cough and sputum production. Negative for shortness of breath.   Cardiovascular:  Negative for chest pain.  Gastrointestinal:  Negative for abdominal pain.  Musculoskeletal:  Negative for falls and joint pain.  Skin:  Negative for rash.  Past Medical History:  Diagnosis Date   Arthritis    back   Asthma    Atherosclerosis of aorta (HCC)    Atherosclerosis of aorta (HCC)    Bronchitis 05/2017   chronic   CAD (coronary artery disease)    Carotid artery disease (HCC)    Carotid stenosis    Carotid stenosis, bilateral    Chronic bronchitis (HCC)    Chronic cough    COPD (chronic obstructive pulmonary disease) (HCC)    Dysrhythmia    GERD (gastroesophageal reflux disease)    Heart murmur    HOH (hard of hearing)    hearing aids   Hypercholesteremia    Hypertension    Kidney disease    stent   Macular degeneration of left eye    Myocardial infarction ([place]) 2016   Pre-diabetes    Seasonal allergies    Seizure ([place])    Seizures ([place])    viral meningitis   Stroke [facility])    1991 and imaging 05/12/15 old strokes   TIA (transient ischemic attack) 1991   x2   Uses hearing aid    Viral meningitis 2017   Viral meningitis    Wears dentures    upper and lower   Past Surgical History:  Procedure Laterality Date   CAROTID ENDARTERECTOMY Left 2014   CAROTID ENDARTERECTOMY Right 1991   CAROTID PTA/STENT INTERVENTION Left 05/31/2020   Procedure: CAROTID PTA/STENT  INTERVENTIO;  Surgeon: Algernon Huxley, MD;  Location: [place] [facility];  Service: Cardiovascular;  Laterality: Left;   CATARACT EXTRACTION W/PHACO Left 06/24/2017   Procedure: CATARACT EXTRACTION PHACO AND INTRAOCULAR LENS PLACEMENT ([place]) LEFT BORDERLINE DIABETIC;  Surgeon: Leandrew Koyanagi, MD;  Location: [place];  Service: Ophthalmology;  Laterality: Left;   CATARACT EXTRACTION W/PHACO Right 07/22/2017   Procedure: CATARACT EXTRACTION PHACO AND INTRAOCULAR LENS PLACEMENT ([place]) RIGHT BORDERLINE DIABETIC;  Surgeon: Leandrew Koyanagi, MD;  Location: [place];  Service: Ophthalmology;  Laterality: Right;   CORONARY ARTERY BYPASS GRAFT     x5   SKIN LESION EXCISION Left    left ear   URETERAL STENT PLACEMENT     Family History  Problem Relation Age of Onset   Stroke Mother    Heart attack Mother    Dementia Mother    Stroke Father    Stroke Paternal Grandmother    Stroke Paternal Grandfather    Alzheimer's disease Paternal Grandfather    Stroke Sister    Alzheimer's disease Sister    Dementia Sister    Stroke Brother    Alzheimer's disease Maternal Grandmother    Dementia Maternal Grandmother  Dementia Sister    Social History   Socioeconomic History   Marital status: Married    Spouse name: Not on file   Number of children: Not on file   Years of education: Not on file   Highest education level: 10th grade  Occupational History   Not on file  Tobacco Use   Smoking status: Former    Packs/day: 2.00    Years: 10.00    Pack years: 20.00    Types: Cigarettes    Quit date: 04/28/1970    Years since quitting: 50.8   Smokeless tobacco: Former  Scientific laboratory technician Use: Never used  Substance and Sexual Activity   Alcohol use: No    Alcohol/week: 0.0 standard drinks    Comment: previous   Drug use: No   Sexual activity: Yes    Comment: wife   Other Topics Concern   Not on file  Social History Narrative   Re-married x 3 years    Retired     Chartered loss adjuster, wears seat belt, safe in relationship    2 sons and 1 daughter    Social Determinants of Radio broadcast assistant Strain: Low Risk    Difficulty of Paying Living Expenses: Not hard at all  Food Insecurity: No Food Insecurity   Worried About Charity fundraiser in the Last Year: Never true   Arboriculturist in the Last Year: Never true  Transportation Needs: No Transportation Needs   Lack of Transportation (Medical): No   Lack of Transportation (Non-Medical): No  Physical Activity: Not on file  Stress: No Stress Concern Present   Feeling of Stress : Not at all  Social Connections: Moderately Integrated   Frequency of Communication with Friends and Family: More than three times a week   Frequency of Social Gatherings with Friends and Family: More than three times a week   Attends Religious Services: More than 4 times per year   Active Member of [employer] or Organizations: No   Attends Archivist Meetings: Never   Marital Status: Married  Human resources officer Violence: Not At Risk   Fear of Current or Ex-Partner: No   Emotionally Abused: No   Physically Abused: No   Sexually Abused: No   Current Meds  Medication Sig   aspirin EC 81 MG tablet Take 81 mg by mouth daily.   Cholecalciferol (VITAMIN D3) 2000 UNITS capsule Take 2,000 Units by mouth daily. am   cyclobenzaprine (FLEXERIL) 5 MG tablet Take 1-2 tablets (5-10 mg total) by mouth at bedtime as needed for muscle spasms.   ELIQUIS 5 MG TABS tablet TAKE 1 TABLET BY MOUTH TWICE A DAY   folic acid (FOLVITE) 1 MG tablet Take 0.8 mg by mouth daily. am   glucose blood (ACCU-CHEK AVIVA PLUS) test strip USE TO CHECK BLOOD SUGAR ONCE DAILY (ICD-10: R73.03)   levofloxacin (LEVAQUIN) 750 MG tablet Take 1 tablet (750 mg total) by mouth every other day.   Multiple Vitamins-Minerals (CENTRUM SILVER 50+MEN PO) Take by mouth.   SPIRIVA HANDIHALER 18 MCG inhalation capsule 1 capsule daily.   tamsulosin (FLOMAX) 0.4 MG CAPS  capsule Take 1 capsule by mouth daily.   [DISCONTINUED] atorvastatin (LIPITOR) 80 MG tablet Take 80 mg by mouth daily.   [DISCONTINUED] fexofenadine (ALLEGRA) 180 MG tablet TAKE 1 TABLET (180 MG TOTAL) BY MOUTH DAILY AS NEEDED FOR ALLERGIES OR RHINITIS.   [DISCONTINUED] FLOVENT HFA 110 MCG/ACT inhaler INHALE 2 PUFFS INTO THE  LUNGS 2 (TWO) TIMES DAILY. RINSE YOUR MOUTH   [DISCONTINUED] hydrALAZINE (APRESOLINE) 50 MG tablet Take 1 tablet (50 mg total) by mouth 3 (three) times daily.   [DISCONTINUED] metoprolol tartrate (LOPRESSOR) 50 MG tablet Take 1 tablet (50 mg total) by mouth 2 (two) times daily. D/c coreg pt prefers metoprolol   [DISCONTINUED] pantoprazole (PROTONIX) 40 MG tablet Take 1 tablet (40 mg total) by mouth 2 (two) times daily before a meal. TRY TO TAKE ONLY 1X PER DAY INSTEAD OF 2 30 MIN BEFORE A MEAL   [DISCONTINUED] PROAIR HFA 108 (90 Base) MCG/ACT inhaler INHALE 1-2 PUFFS INTO THE LUNGS EVERY 4 (FOUR) HOURS AS NEEDED FOR WHEEZING OR SHORTNESS OF BREATH.   Current Facility-Administered Medications for the 02/19/21 encounter (Office Visit) with McLean-Scocuzza, Nino Glow, MD  Medication   ipratropium-albuterol (DUONEB) 0.5-2.5 (3) MG/3ML nebulizer solution 3 mL   Allergies  Allergen Reactions   Fexofenadine-Pseudoephed Er Swelling    Tongue and face. Only with D product. Tolerates plain allegra at home.  .    Nifedipine Swelling    Tongue and face     Pseudoephedrine Swelling   Allegra  [Fexofenadine] Swelling    Tongue and face. Only with D product. Tolerates plain allegra at home.    Anoro Ellipta [Umeclidinium-Vilanterol]    Carvedilol Other (See Comments)    Light headed, heart racing, increase in blood pressure    Prednisone Other (See Comments)    Oral prednisone. Confusion. Mental status changes   Recent Results (from the past 2160 hour(s))  Resp Panel by RT-PCR (Flu A&B, Covid) Nasopharyngeal Swab     Status: None   Collection Time: 11/30/20 10:55 AM   Specimen:  Nasopharyngeal Swab; Nasopharyngeal(NP) swabs in vial transport medium  Result Value Ref Range   SARS Coronavirus 2 by RT PCR NEGATIVE NEGATIVE    Comment: (NOTE) SARS-CoV-2 target nucleic acids are NOT DETECTED.  The SARS-CoV-2 RNA is generally detectable in upper respiratory specimens during the acute phase of infection. The lowest concentration of SARS-CoV-2 viral copies this assay can detect is 138 copies/mL. A negative result does not preclude SARS-Cov-2 infection and should not be used as the sole basis for treatment or other patient management decisions. A negative result may occur with  improper specimen collection/handling, submission of specimen other than nasopharyngeal swab, presence of viral mutation(s) within the areas targeted by this assay, and inadequate number of viral copies(<138 copies/mL). A negative result must be combined with clinical observations, patient history, and epidemiological information. The expected result is Negative.  Fact Sheet for Patients:  EntrepreneurPulse.com.au  Fact Sheet for Healthcare Providers:  IncredibleEmployment.be  This test is no t yet approved or cleared by the [place] FDA and  has been authorized for detection and/or diagnosis of SARS-CoV-2 by FDA under an Emergency Use Authorization (EUA). This EUA will remain  in effect (meaning this test can be used) for the duration of the COVID-19 declaration under Section 564(b)(1) of the Act, 21 U.S.C.section 360bbb-3(b)(1), unless the authorization is terminated  or revoked sooner.       Influenza A by PCR NEGATIVE NEGATIVE   Influenza B by PCR NEGATIVE NEGATIVE    Comment: (NOTE) The Xpert Xpress SARS-CoV-2/FLU/RSV plus assay is intended as an aid in the diagnosis of influenza from Nasopharyngeal swab specimens and should not be used as a sole basis for treatment. Nasal washings and aspirates are unacceptable for Xpert Xpress  SARS-CoV-2/FLU/RSV testing.  Fact Sheet for Patients: EntrepreneurPulse.com.au  Fact Sheet for  Healthcare Providers: IncredibleEmployment.be  This test is not yet approved or cleared by the [place] and has been authorized for detection and/or diagnosis of SARS-CoV-2 by FDA under an Emergency Use Authorization (EUA). This EUA will remain in effect (meaning this test can be used) for the duration of the COVID-19 declaration under Section 564(b)(1) of the Act, 21 U.S.C. section 360bbb-3(b)(1), unless the authorization is terminated or revoked.  Performed at [facility], [address], [place],  [address]    Objective  Body mass index is 26.68 kg/m. Wt Readings from Last 3 Encounters:  02/19/21 219 lb 3.2 oz (99.4 kg)  02/11/21 225 lb (102.1 kg)  11/30/20 225 lb (102.1 kg)   Temp Readings from Last 3 Encounters:  02/19/21 98 F (36.7 C) (Oral)  02/11/21 (!) 97.5 F (36.4 C)  11/30/20 98.2 F (36.8 C) (Oral)   BP Readings from Last 3 Encounters:  02/19/21 126/66  02/11/21 (!) 116/58  11/30/20 (!) 125/97   Pulse Readings from Last 3 Encounters:  02/19/21 60  02/11/21 74  11/30/20 79    Physical Exam Vitals and nursing note reviewed.  Constitutional:      Appearance: Normal appearance. He is well-developed and well-groomed.  HENT:     Head: Normocephalic and atraumatic.  Eyes:     Conjunctiva/sclera: Conjunctivae normal.     Pupils: Pupils are equal, round, and reactive to light.  Cardiovascular:     Rate and Rhythm: Normal rate and regular rhythm.     Heart sounds: Normal heart sounds. No murmur heard. Pulmonary:     Effort: Pulmonary effort is normal.     Breath sounds: Normal breath sounds.  Skin:    General: Skin is warm and dry.  Neurological:     General: No focal deficit present.     Mental Status: He is alert and oriented to person, place, and time. Mental status is at baseline.      Gait: Gait normal.  Psychiatric:        Attention and Perception: Attention and perception normal.        Mood and Affect: Mood and affect normal.        Speech: Speech normal.        Behavior: Behavior normal. Behavior is cooperative.        Thought Content: Thought content normal.        Cognition and Memory: Cognition and memory normal.        Judgment: Judgment normal.    Assessment  Plan  Mild persistent asthma without complication vs copd exacerbation ct chest 09/2018 +emphysemia - Plan: DG Chest 2 View, fexofenadine (ALLEGRA) 180 MG tablet, fluticasone (FLOVENT HFA) 110 MCG/ACT inhaler, albuterol (PROAIR HFA) 108 (90 Base) MCG/ACT inhaler, spiriva With exacerbation Rx levaquin 750 mg Q48 x 5 doses  F/u pulm Dr. Raul Del if cough continues consider CT chest  Chronic bilateral low back pain with right-sided sciatica - Plan: DG Lumbar Spine Complete Tried otc tylenol and heat, exercises  Consider MRI in future   Prediabetes - Plan: Hemoglobin A1c  Bilateral leg edema - Plan: B Nat Peptide  COPD exacerbation (HCC) - Plan: levofloxacin (LEVAQUIN) 750 MG tablet, fluticasone (FLOVENT HFA) 110 MCG/ACT inhaler, albuterol (PROAIR HFA) 108 (90 Base) MCG/ACT inhaler  Essential hypertension controlled - Plan: hydrALAZINE (APRESOLINE) 50 MG tablet tid, lopressor 50 mg bid   Gastroesophageal reflux disease without esophagitis - Plan: pantoprazole (PROTONIX) 40 MG tablet   HM Flu shot utd pna 23  utd prevnar utd  Tdap utd 07/05/2018  Had 2/2 pfizer utd declines further Consider shingrix in future if not had    Hep C neg 10/07/13  Former smoker since age 47 y.o to 29 max 2ppd  cologaurd 07/12/18 neg PSA  normal 06/28/18 0.38      Cards Dr. Raliegh Ip  Renal Dr. Candiss Norse  Vascular Dr. Lucky Cowboy  pulm Dr. Francesca Oman [facility] derm  Neurology Dr. Melrose Nakayama (2017) AE Dr. Wallace Going Provider: Dr. Olivia Mackie McLean-Scocuzza-Internal Medicine

## 2021-03-01 ENCOUNTER — Other Ambulatory Visit (INDEPENDENT_AMBULATORY_CARE_PROVIDER_SITE_OTHER): Payer: Self-pay | Admitting: Nurse Practitioner

## 2021-03-13 ENCOUNTER — Other Ambulatory Visit: Payer: Self-pay

## 2021-03-13 ENCOUNTER — Other Ambulatory Visit (INDEPENDENT_AMBULATORY_CARE_PROVIDER_SITE_OTHER): Payer: Medicare Other

## 2021-03-13 DIAGNOSIS — I480 Paroxysmal atrial fibrillation: Secondary | ICD-10-CM | POA: Diagnosis not present

## 2021-03-13 DIAGNOSIS — R7303 Prediabetes: Secondary | ICD-10-CM | POA: Diagnosis not present

## 2021-03-13 DIAGNOSIS — Z13220 Encounter for screening for lipoid disorders: Secondary | ICD-10-CM | POA: Diagnosis not present

## 2021-03-13 DIAGNOSIS — R600 Localized edema: Secondary | ICD-10-CM

## 2021-03-13 DIAGNOSIS — Z1329 Encounter for screening for other suspected endocrine disorder: Secondary | ICD-10-CM

## 2021-03-13 LAB — BRAIN NATRIURETIC PEPTIDE: Pro B Natriuretic peptide (BNP): 302 pg/mL — ABNORMAL HIGH (ref 0.0–100.0)

## 2021-03-13 LAB — LIPID PANEL
Cholesterol: 92 mg/dL (ref 0–200)
HDL: 35.9 mg/dL — ABNORMAL LOW (ref >39.00)
LDL Cholesterol: 43 mg/dL (ref 0–99)
NonHDL: 56.1
Total CHOL/HDL Ratio: 3
Triglycerides: 68 mg/dL (ref 0.0–149.0)
VLDL: 13.6 mg/dL (ref 0.0–40.0)

## 2021-03-13 LAB — TSH: TSH: 2.31 u[IU]/mL (ref 0.35–5.50)

## 2021-03-13 LAB — HEMOGLOBIN A1C: Hgb A1c MFr Bld: 5.9 % (ref 4.6–6.5)

## 2021-03-13 NOTE — Addendum Note (Signed)
Addended by: Orland Mustard on: 03/13/2021 03:01 PM   Modules accepted: Orders

## 2021-03-14 DIAGNOSIS — J310 Chronic rhinitis: Secondary | ICD-10-CM | POA: Diagnosis not present

## 2021-03-14 DIAGNOSIS — R053 Chronic cough: Secondary | ICD-10-CM | POA: Diagnosis not present

## 2021-03-14 DIAGNOSIS — J439 Emphysema, unspecified: Secondary | ICD-10-CM | POA: Diagnosis not present

## 2021-04-09 ENCOUNTER — Encounter (INDEPENDENT_AMBULATORY_CARE_PROVIDER_SITE_OTHER): Payer: Self-pay | Admitting: Vascular Surgery

## 2021-04-09 ENCOUNTER — Ambulatory Visit (INDEPENDENT_AMBULATORY_CARE_PROVIDER_SITE_OTHER): Payer: Medicare Other | Admitting: Vascular Surgery

## 2021-04-09 ENCOUNTER — Ambulatory Visit (INDEPENDENT_AMBULATORY_CARE_PROVIDER_SITE_OTHER): Payer: Medicare Other

## 2021-04-09 ENCOUNTER — Other Ambulatory Visit: Payer: Self-pay

## 2021-04-09 VITALS — BP 116/58 | HR 67 | Resp 16 | Wt 216.0 lb

## 2021-04-09 DIAGNOSIS — N1831 Chronic kidney disease, stage 3a: Secondary | ICD-10-CM | POA: Diagnosis not present

## 2021-04-09 DIAGNOSIS — I10 Essential (primary) hypertension: Secondary | ICD-10-CM | POA: Diagnosis not present

## 2021-04-09 DIAGNOSIS — I6523 Occlusion and stenosis of bilateral carotid arteries: Secondary | ICD-10-CM | POA: Diagnosis not present

## 2021-04-09 DIAGNOSIS — E785 Hyperlipidemia, unspecified: Secondary | ICD-10-CM

## 2021-04-09 DIAGNOSIS — I701 Atherosclerosis of renal artery: Secondary | ICD-10-CM | POA: Diagnosis not present

## 2021-04-09 NOTE — Progress Notes (Signed)
MRN : [id number]  Keith Cruz is a 80 y.o. ([birth year]) male who presents with chief complaint of  Chief Complaint  Patient presents with   Follow-up    Follow up    .  History of Present Illness: Patient returns in follow-up of his carotid disease.  He is doing well today.  He denies any complaints or problems.  No focal neurologic symptoms.  He had a left carotid stent done earlier this year for recurrent stenosis.  He had a redo right carotid endarterectomy performed last year at another institution for recurrent stenosis. Carotid duplex today reveals widely patent carotid stent on the left was performed earlier this year and his redo right carotid endarterectomy from last year is widely patent as well.   Current Outpatient Medications  Medication Sig Dispense Refill   albuterol (PROAIR HFA) 108 (90 Base) MCG/ACT inhaler INHALE 1-2 PUFFS INTO THE LUNGS EVERY 4 (FOUR) HOURS AS NEEDED FOR WHEEZING OR SHORTNESS OF BREATH. 18 each 12   aspirin EC 81 MG tablet Take 81 mg by mouth daily.     atorvastatin (LIPITOR) 80 MG tablet Take 1 tablet (80 mg total) by mouth daily. 90 tablet 3   Cholecalciferol (VITAMIN D3) 2000 UNITS capsule Take 2,000 Units by mouth daily. am     cyclobenzaprine (FLEXERIL) 5 MG tablet Take 1-2 tablets (5-10 mg total) by mouth at bedtime as needed for muscle spasms. 60 tablet 2   ELIQUIS 5 MG TABS tablet TAKE ONE TABLET BY MOUTH TWICE DAILY 60 tablet 0   fexofenadine (ALLEGRA) 180 MG tablet Take 1 tablet (180 mg total) by mouth daily as needed for allergies or rhinitis. 90 tablet 3   fluticasone (FLOVENT HFA) 110 MCG/ACT inhaler Inhale 2 puffs into the lungs 2 (two) times daily. Rinse your mouth 12 each 12   folic acid (FOLVITE) 1 MG tablet Take 0.8 mg by mouth daily. am     glucose blood (ACCU-CHEK AVIVA PLUS) test strip USE TO CHECK BLOOD SUGAR ONCE DAILY (ICD-10: R73.03) 100 strip 5   hydrALAZINE (APRESOLINE) 50 MG tablet Take 1 tablet (50 mg total) by mouth 3  (three) times daily. 270 tablet 3   metoprolol tartrate (LOPRESSOR) 50 MG tablet Take 1 tablet (50 mg total) by mouth 2 (two) times daily. D/c coreg pt prefers metoprolol 180 tablet 3   Multiple Vitamins-Minerals (CENTRUM SILVER 50+MEN PO) Take by mouth.     pantoprazole (PROTONIX) 40 MG tablet Take 1 tablet (40 mg total) by mouth 2 (two) times daily before a meal. TRY TO TAKE ONLY 1X PER DAY INSTEAD OF 2 30 MIN BEFORE A MEAL 180 tablet 3   SPIRIVA HANDIHALER 18 MCG inhalation capsule 1 capsule daily.     tamsulosin (FLOMAX) 0.4 MG CAPS capsule Take 1 capsule by mouth daily.     Current Facility-Administered Medications  Medication Dose Route Frequency Provider Last Rate Last Admin   ipratropium-albuterol (DUONEB) 0.5-2.5 (3) MG/3ML nebulizer solution 3 mL  3 mL Nebulization Once Olin Hauser, DO        Past Medical History:  Diagnosis Date   Arthritis    back   Asthma    Atherosclerosis of aorta (HCC)    Atherosclerosis of aorta ([place])    Bronchitis 05/2017   chronic   CAD (coronary artery disease)    Carotid artery disease (HCC)    Carotid stenosis    Carotid stenosis, bilateral    Chronic bronchitis (HCC)    Chronic  cough    COPD (chronic obstructive pulmonary disease) (HCC)    Dysrhythmia    GERD (gastroesophageal reflux disease)    Heart murmur    HOH (hard of hearing)    hearing aids   Hypercholesteremia    Hypertension    Kidney disease    stent   Macular degeneration of left eye    Myocardial infarction ([place]) 2016   Pre-diabetes    Seasonal allergies    Seizure ([place])    Seizures ([place])    viral meningitis   Stroke [facility])    1991 and imaging 05/12/15 old strokes   TIA (transient ischemic attack) 1991   x2   Uses hearing aid    Viral meningitis 2017   Viral meningitis    Wears dentures    upper and lower    Past Surgical History:  Procedure Laterality Date   CAROTID ENDARTERECTOMY Left 2014   CAROTID ENDARTERECTOMY Right 1991   CAROTID  PTA/STENT INTERVENTION Left 05/31/2020   Procedure: CAROTID PTA/STENT INTERVENTIO;  Surgeon: Algernon Huxley, MD;  Location: [place] [facility];  Service: Cardiovascular;  Laterality: Left;   CATARACT EXTRACTION W/PHACO Left 06/24/2017   Procedure: CATARACT EXTRACTION PHACO AND INTRAOCULAR LENS PLACEMENT ([place]) LEFT BORDERLINE DIABETIC;  Surgeon: Leandrew Koyanagi, MD;  Location: [place];  Service: Ophthalmology;  Laterality: Left;   CATARACT EXTRACTION W/PHACO Right 07/22/2017   Procedure: CATARACT EXTRACTION PHACO AND INTRAOCULAR LENS PLACEMENT ([place]) RIGHT BORDERLINE DIABETIC;  Surgeon: Leandrew Koyanagi, MD;  Location: [place];  Service: Ophthalmology;  Laterality: Right;   CORONARY ARTERY BYPASS GRAFT     x5   SKIN LESION EXCISION Left    left ear   URETERAL STENT PLACEMENT       Social History   Tobacco Use   Smoking status: Former    Packs/day: 2.00    Years: 10.00    Pack years: 20.00    Types: Cigarettes    Quit date: 04/28/1970    Years since quitting: 50.9   Smokeless tobacco: Former  Scientific laboratory technician Use: Never used  Substance Use Topics   Alcohol use: No    Alcohol/week: 0.0 standard drinks    Comment: previous   Drug use: No      Family History  Problem Relation Age of Onset   Stroke Mother    Heart attack Mother    Dementia Mother    Stroke Father    Stroke Paternal Grandmother    Stroke Paternal Grandfather    Alzheimer's disease Paternal Grandfather    Stroke Sister    Alzheimer's disease Sister    Dementia Sister    Stroke Brother    Alzheimer's disease Maternal Grandmother    Dementia Maternal Grandmother    Dementia Sister      Allergies  Allergen Reactions   Fexofenadine-Pseudoephed Er Swelling    Tongue and face. Only with D product. Tolerates plain allegra at home.  .    Nifedipine Swelling    Tongue and face     Pseudoephedrine Swelling   Allegra  [Fexofenadine] Swelling    Tongue and face. Only with D  product. Tolerates plain allegra at home.    Anoro Ellipta [Umeclidinium-Vilanterol]    Carvedilol Other (See Comments)    Light headed, heart racing, increase in blood pressure    Prednisone Other (See Comments)    Oral prednisone. Confusion. Mental status changes    REVIEW OF SYSTEMS (Negative unless checked)   Constitutional: [] Weight  loss  [] Fever  [] Chills Cardiac: [] Chest pain   [] Chest pressure   [] Palpitations   [] Shortness of breath when laying flat   [] Shortness of breath at rest   [] Shortness of breath with exertion. Vascular:  [] Pain in legs with walking   [] Pain in legs at rest   [] Pain in legs when laying flat   [] Claudication   [] Pain in feet when walking  [] Pain in feet at rest  [] Pain in feet when laying flat   [] History of DVT   [] Phlebitis   [] Swelling in legs   [] Varicose veins   [] Non-healing ulcers Pulmonary:   [] Uses home oxygen   [] Productive cough   [] Hemoptysis   [] Wheeze  [] COPD   [] Asthma Neurologic:  [] Dizziness  [] Blackouts   [] Seizures   [x] History of stroke   [] History of TIA  [] Aphasia   [] Temporary blindness   [] Dysphagia   [] Weakness or numbness in arms   [] Weakness or numbness in legs Musculoskeletal:  [x] Arthritis   [] Joint swelling   [x] Joint pain   [] Low back pain Hematologic:  [] Easy bruising  [] Easy bleeding   [] Hypercoagulable state   [] Anemic   Gastrointestinal:  [] Blood in stool   [] Vomiting blood  [] Gastroesophageal reflux/heartburn   [] Abdominal pain Genitourinary:  [x] Chronic kidney disease   [] Difficult urination  [] Frequent urination  [] Burning with urination   [] Hematuria Skin:  [] Rashes   [] Ulcers   [] Wounds Psychological:  [] History of anxiety   []  History of major depression.  Physical Examination  Vitals:   04/09/21 0835  BP: (!) 116/58  Pulse: 67  Resp: 16  Weight: 216 lb (98 kg)   Body mass index is 26.29 kg/m. Gen:  WD/WN, NAD. Appears younger than stated age. Head: [place]/AT, No temporalis wasting. Ear/Nose/Throat: Hearing  grossly intact, nares w/o erythema or drainage, trachea midline Eyes: Conjunctiva clear. Sclera non-icteric Neck: Supple.  No bruit  Pulmonary:  Good air movement, equal and clear to auscultation bilaterally.  Cardiac: RRR, No JVD Vascular:  Vessel Right Left  Radial Palpable Palpable           Musculoskeletal: M/S 5/5 throughout.  No deformity or atrophy. No edema. Neurologic: CN 2-12 intact. Sensation grossly intact in extremities.  Symmetrical.  Speech is fluent. Motor exam as listed above. Psychiatric: Judgment intact, Mood & affect appropriate for pt's clinical situation. Dermatologic: No rashes or ulcers noted.  No cellulitis or open wounds.     CBC Lab Results  Component Value Date   WBC 8.9 06/01/2020   HGB 13.5 06/01/2020   HCT 40.6 06/01/2020   MCV 105.5 (H) 06/01/2020   PLT 184 06/01/2020    BMET    Component Value Date/Time   NA 138 06/01/2020 0619   NA 140 01/15/2018 0000   NA 140 10/04/2013 0418   K 4.4 06/01/2020 0619   K 4.0 10/04/2013 0418   CL 106 06/01/2020 0619   CL 106 10/04/2013 0418   CO2 27 06/01/2020 0619   CO2 30 10/04/2013 0418   GLUCOSE 118 (H) 06/01/2020 0619   GLUCOSE 108 (H) 10/04/2013 0418   BUN 16 06/01/2020 0619   BUN 22 (A) 01/15/2018 0000   BUN 10 10/04/2013 0418   CREATININE 1.45 (H) 06/01/2020 0619   CREATININE 1.42 (H) 06/29/2017 0946   CALCIUM 8.8 (L) 06/01/2020 0619   CALCIUM 8.6 10/04/2013 0418   GFRNONAA 49 (L) 06/01/2020 0619   GFRNONAA 47 (L) 06/29/2017 0946   GFRAA 55 (L) 06/29/2017 0946   CrCl cannot be calculated (  Patient's most recent lab result is older than the maximum 21 days allowed.).  COAG Lab Results  Component Value Date   INR 1.0 10/01/2013   INR 1.0 11/25/2012    Radiology No results found.   Assessment/Plan Benign essential HTN blood pressure control important in reducing the progression of atherosclerotic disease. On appropriate oral medications.     Renal artery stenosis (HCC) Will  check renal duplex at next visit.   Chronic kidney disease, stage III (moderate) Avoid contrast unless absolutely necessary   Hyperlipidemia lipid control important in reducing the progression of atherosclerotic disease. Continue statin therapy  Bilateral carotid artery stenosis Carotid duplex today reveals widely patent carotid stent on the left was performed earlier this year and his redo right carotid endarterectomy from last year is widely patent as well.  Continue current medical regimen including aspirin, Eliquis, and statin agent.  Recheck in 6 months.    Leotis Pain, MD  04/09/2021 9:36 AM    This note was created with Dragon medical transcription system.  Any errors from dictation are purely unintentional

## 2021-04-09 NOTE — Assessment & Plan Note (Signed)
Carotid duplex today reveals widely patent carotid stent on the left was performed earlier this year and his redo right carotid endarterectomy from last year is widely patent as well.  Continue current medical regimen including aspirin, Eliquis, and statin agent.  Recheck in 6 months.

## 2021-05-22 ENCOUNTER — Ambulatory Visit (INDEPENDENT_AMBULATORY_CARE_PROVIDER_SITE_OTHER): Payer: Medicare Other | Admitting: Internal Medicine

## 2021-05-22 ENCOUNTER — Other Ambulatory Visit: Payer: Self-pay

## 2021-05-22 ENCOUNTER — Encounter: Payer: Self-pay | Admitting: Internal Medicine

## 2021-05-22 VITALS — BP 120/64 | HR 65 | Temp 97.0°F | Ht 76.0 in | Wt 219.2 lb

## 2021-05-22 DIAGNOSIS — I6523 Occlusion and stenosis of bilateral carotid arteries: Secondary | ICD-10-CM

## 2021-05-22 DIAGNOSIS — E785 Hyperlipidemia, unspecified: Secondary | ICD-10-CM

## 2021-05-22 DIAGNOSIS — M5136 Other intervertebral disc degeneration, lumbar region: Secondary | ICD-10-CM

## 2021-05-22 DIAGNOSIS — J4530 Mild persistent asthma, uncomplicated: Secondary | ICD-10-CM

## 2021-05-22 DIAGNOSIS — I700 Atherosclerosis of aorta: Secondary | ICD-10-CM | POA: Diagnosis not present

## 2021-05-22 DIAGNOSIS — Z1283 Encounter for screening for malignant neoplasm of skin: Secondary | ICD-10-CM | POA: Diagnosis not present

## 2021-05-22 DIAGNOSIS — Z1211 Encounter for screening for malignant neoplasm of colon: Secondary | ICD-10-CM | POA: Diagnosis not present

## 2021-05-22 DIAGNOSIS — R413 Other amnesia: Secondary | ICD-10-CM | POA: Diagnosis not present

## 2021-05-22 DIAGNOSIS — J432 Centrilobular emphysema: Secondary | ICD-10-CM | POA: Diagnosis not present

## 2021-05-22 MED ORDER — FLUTICASONE PROPIONATE HFA 220 MCG/ACT IN AERO: 2.0000 | INHALATION_SPRAY | Freq: Two times a day (BID) | RESPIRATORY_TRACT | 11 refills | Status: DC

## 2021-05-22 NOTE — Patient Instructions (Addendum)
Let me know if referral desired Dr. Melrose Nakayama or Brigitte Pulse for memory loss Increase flovent to 220 2 puffs 2x per day rinse mouth   Dermatology [facility]   Phone Fax E-mail Address  [phone] Not available [address]   [place] [address]     Specialties        Management of Memory Problems  There are some general things you can do to help manage your memory problems.  Your memory may not in fact recover, but by using techniques and strategies you will be able to manage your memory difficulties better.  1)  Establish a routine. Try to establish and then stick to a regular routine.  By doing this, you will get used to what to expect and you will reduce the need to rely on your memory.  Also, try to do things at the same time of day, such as taking your medication or checking your calendar first thing in the morning. Think about think that you can do as a part of a regular routine and make a list.  Then enter them into a daily planner to remind you.  This will help you establish a routine.  2)  Organize your environment. Organize your environment so that it is uncluttered.  Decrease visual stimulation.  Place everyday items such as keys or cell phone in the same place every day (ie.  Basket next to front door) Use post it notes with a brief message to yourself (ie. Turn off light, lock the door) Use labels to indicate where things go (ie. Which cupboards are for food, dishes, etc.) Keep a notepad and pen by the telephone to take messages  3)  Memory Aids A diary or journal/notebook/daily planner Making a list (shopping list, chore list, to do list that needs to be done) Using an alarm as a reminder (kitchen timer or cell phone alarm) Using cell phone to store information (Notes, Calendar, Reminders) Calendar/White board placed in a prominent position Post-it notes  In order for memory aids to be useful, you need to have good habits.  It's no good  remembering to make a note in your journal if you don't remember to look in it.  Try setting aside a certain time of day to look in journal.  4)  Improving mood and managing fatigue. There may be other factors that contribute to memory difficulties.  Factors, such as anxiety, depression and tiredness can affect memory. Regular gentle exercise can help improve your mood and give you more energy. Simple relaxation techniques may help relieve symptoms of anxiety Try to get back to completing activities or hobbies you enjoyed doing in the past. Learn to pace yourself through activities to decrease fatigue. Find out about some local support groups where you can share experiences with others. Try and achieve 7-8 hours of sleep at night. Memory Compensation Strategies  Use "WARM" strategy.  W= write it down  A= associate it  R= repeat it  M= make a mental note  2.   You can keep a Social worker.  Use a 3-ring notebook with sections for the following: calendar, important names and phone numbers,  medications, doctors' names/phone numbers, lists/reminders, and a section to journal what you did  each day.   3.    Use a calendar to write appointments down.  4.    Write yourself a schedule for the day.  This can be placed on the calendar or in a separate section of  the [employer].  Keeping a  regular schedule can help memory.  5.    Use medication organizer with sections for each day or morning/evening pills.  You may need help loading it  6.    Keep a basket, or pegboard by the door.  Place items that you need to take out with you in the basket or on the pegboard.  You may also want to  include a message board for reminders.  7.    Use sticky notes.  Place sticky notes with reminders in a place where the task is performed.  For example: " turn off the  stove" placed by the stove, "lock the door" placed on the door at eye level, " take your medications" on  the bathroom mirror or by the  place where you normally take your medications.  8.    Use alarms/timers.  Use while cooking to remind yourself to check on food or as a reminder to take your medicine, or as a  reminder to make a call, or as a reminder to perform another task, etc.

## 2021-05-22 NOTE — Progress Notes (Signed)
Chief Complaint  Patient presents with   Follow-up   F/u with wife  1. Skin lesions consider dermatology f/u check insurance in network [facility] in [facility] past  2. Cad on eliquis 5 mg bid and lipitor 80  3. Copd/asthma on flovent 110 mcg not helping 2 pf bid and prn albuterol not using spiriva handihaler not helping est pulm Dr. Raul Del has chronic cough and wheezing and takes mucinex  Cxr 02/19/21 negative   Review of Systems  Constitutional:  Negative for weight loss.  HENT:  Negative for hearing loss.   Eyes:  Negative for blurred vision.  Respiratory:  Positive for cough. Negative for shortness of breath.   Cardiovascular:  Negative for chest pain.  Gastrointestinal:  Negative for abdominal pain and blood in stool.  Musculoskeletal:  Negative for back pain.  Skin:  Negative for rash.  Neurological:  Negative for headaches.  Psychiatric/Behavioral:  Negative for depression.   Past Medical History:  Diagnosis Date   Arthritis    back   Asthma    Atherosclerosis of aorta (HCC)    Atherosclerosis of aorta (HCC)    Bronchitis 05/2017   chronic   CAD (coronary artery disease)    Carotid artery disease (HCC)    Carotid stenosis    Carotid stenosis, bilateral    Chronic bronchitis (HCC)    Chronic cough    COPD (chronic obstructive pulmonary disease) (HCC)    Dysrhythmia    GERD (gastroesophageal reflux disease)    Heart murmur    HOH (hard of hearing)    hearing aids   Hypercholesteremia    Hypertension    Kidney disease    stent   Macular degeneration of left eye    Myocardial infarction ([place]) 2016   Pre-diabetes    Seasonal allergies    Seizure ([place])    Seizures ([place])    viral meningitis   Stroke [facility])    1991 and imaging 05/12/15 old strokes   TIA (transient ischemic attack) 1991   x2   Uses hearing aid    Viral meningitis 2017   Viral meningitis    Wears dentures    upper and lower   Past Surgical History:  Procedure Laterality Date   CAROTID  ENDARTERECTOMY Left 2014   CAROTID ENDARTERECTOMY Right 1991   CAROTID PTA/STENT INTERVENTION Left 05/31/2020   Procedure: CAROTID PTA/STENT INTERVENTIO;  Surgeon: Algernon Huxley, MD;  Location: [facility];  Service: Cardiovascular;  Laterality: Left;   CATARACT EXTRACTION W/PHACO Left 06/24/2017   Procedure: CATARACT EXTRACTION PHACO AND INTRAOCULAR LENS PLACEMENT ([place]) LEFT BORDERLINE DIABETIC;  Surgeon: Leandrew Koyanagi, MD;  Location: [facility];  Service: Ophthalmology;  Laterality: Left;   CATARACT EXTRACTION W/PHACO Right 07/22/2017   Procedure: CATARACT EXTRACTION PHACO AND INTRAOCULAR LENS PLACEMENT ([place]) RIGHT BORDERLINE DIABETIC;  Surgeon: Leandrew Koyanagi, MD;  Location: [facility];  Service: Ophthalmology;  Laterality: Right;   CORONARY ARTERY BYPASS GRAFT     x5   SKIN LESION EXCISION Left    left ear   URETERAL STENT PLACEMENT     Family History  Problem Relation Age of Onset   Stroke Mother    Heart attack Mother    Dementia Mother    Stroke Father    Stroke Paternal Grandmother    Stroke Paternal Grandfather    Alzheimer's disease Paternal Grandfather    Stroke Sister    Alzheimer's disease Sister    Dementia Sister    Stroke  Brother    Alzheimer's disease Maternal Grandmother    Dementia Maternal Grandmother    Dementia Sister    Social History   Socioeconomic History   Marital status: Married    Spouse name: Not on file   Number of children: Not on file   Years of education: Not on file   Highest education level: 10th grade  Occupational History   Not on file  Tobacco Use   Smoking status: Former    Packs/day: 2.00    Years: 10.00    Pack years: 20.00    Types: Cigarettes    Quit date: 04/28/1970    Years since quitting: 51.1   Smokeless tobacco: Former  Scientific laboratory technician Use: Never used  Substance and Sexual Activity   Alcohol use: No    Alcohol/week: 0.0 standard drinks    Comment: previous   Drug use: No    Sexual activity: Yes    Comment: wife   Other Topics Concern   Not on file  Social History Narrative   Re-married x 3 years    Retired    Chartered loss adjuster, wears seat belt, safe in relationship    2 sons and 1 daughter    Social Determinants of Radio broadcast assistant Strain: Low Risk    Difficulty of Paying Living Expenses: Not hard at all  Food Insecurity: No Food Insecurity   Worried About Charity fundraiser in the Last Year: Never true   Arboriculturist in the Last Year: Never true  Transportation Needs: No Transportation Needs   Lack of Transportation (Medical): No   Lack of Transportation (Non-Medical): No  Physical Activity: Not on file  Stress: No Stress Concern Present   Feeling of Stress : Not at all  Social Connections: Moderately Integrated   Frequency of Communication with Friends and Family: More than three times a week   Frequency of Social Gatherings with Friends and Family: More than three times a week   Attends Religious Services: More than 4 times per year   Active Member of [employer] or Organizations: No   Attends Archivist Meetings: Never   Marital Status: Married  Human resources officer Violence: Not At Risk   Fear of Current or Ex-Partner: No   Emotionally Abused: No   Physically Abused: No   Sexually Abused: No   Current Meds  Medication Sig   albuterol (PROAIR HFA) 108 (90 Base) MCG/ACT inhaler INHALE 1-2 PUFFS INTO THE LUNGS EVERY 4 (FOUR) HOURS AS NEEDED FOR WHEEZING OR SHORTNESS OF BREATH.   aspirin EC 81 MG tablet Take 81 mg by mouth daily.   atorvastatin (LIPITOR) 80 MG tablet Take 1 tablet (80 mg total) by mouth daily.   Cholecalciferol (VITAMIN D3) 2000 UNITS capsule Take 2,000 Units by mouth daily. am   ELIQUIS 5 MG TABS tablet TAKE ONE TABLET BY MOUTH TWICE DAILY   fexofenadine (ALLEGRA) 180 MG tablet Take 1 tablet (180 mg total) by mouth daily as needed for allergies or rhinitis.   fluticasone (FLOVENT HFA) 220 MCG/ACT inhaler Inhale 2  puffs into the lungs 2 (two) times daily. Rinse your mouth   folic acid (FOLVITE) 1 MG tablet Take 0.8 mg by mouth daily. am   glucose blood (ACCU-CHEK AVIVA PLUS) test strip USE TO CHECK BLOOD SUGAR ONCE DAILY (ICD-10: R73.03)   hydrALAZINE (APRESOLINE) 50 MG tablet Take 1 tablet (50 mg total) by mouth 3 (three) times daily.   metoprolol tartrate (  LOPRESSOR) 50 MG tablet Take 1 tablet (50 mg total) by mouth 2 (two) times daily. D/c coreg pt prefers metoprolol   Multiple Vitamins-Minerals (CENTRUM SILVER 50+MEN PO) Take by mouth.   pantoprazole (PROTONIX) 40 MG tablet Take 1 tablet (40 mg total) by mouth 2 (two) times daily before a meal. TRY TO TAKE ONLY 1X PER DAY INSTEAD OF 2 30 MIN BEFORE A MEAL   tamsulosin (FLOMAX) 0.4 MG CAPS capsule Take 1 capsule by mouth daily.   [DISCONTINUED] fluticasone (FLOVENT HFA) 110 MCG/ACT inhaler Inhale 2 puffs into the lungs 2 (two) times daily. Rinse your mouth   Current Facility-Administered Medications for the 05/22/21 encounter (Office Visit) with McLean-Scocuzza, Nino Glow, MD  Medication   ipratropium-albuterol (DUONEB) 0.5-2.5 (3) MG/3ML nebulizer solution 3 mL   Allergies  Allergen Reactions   Fexofenadine-Pseudoephed Er Swelling    Tongue and face. Only with D product. Tolerates plain allegra at home.  .    Nifedipine Swelling    Tongue and face     Pseudoephedrine Swelling   Allegra  [Fexofenadine] Swelling    Tongue and face. Only with D product. Tolerates plain allegra at home.    Anoro Ellipta [Umeclidinium-Vilanterol]    Carvedilol Other (See Comments)    Light headed, heart racing, increase in blood pressure    Prednisone Other (See Comments)    Oral prednisone. Confusion. Mental status changes   Recent Results (from the past 2160 hour(s))  B Nat Peptide     Status: Abnormal   Collection Time: 03/13/21  7:34 AM  Result Value Ref Range   Pro B Natriuretic peptide (BNP) 302.0 (H) 0.0 - 100.0 pg/mL  Hemoglobin A1c     Status: None    Collection Time: 03/13/21  7:34 AM  Result Value Ref Range   Hgb A1c MFr Bld 5.9 4.6 - 6.5 %    Comment: Glycemic Control Guidelines for People with Diabetes:Non Diabetic:  <6%Goal of Therapy: <7%Additional Action Suggested:  >8%   TSH     Status: None   Collection Time: 03/13/21  7:34 AM  Result Value Ref Range   TSH 2.31 0.35 - 5.50 uIU/mL  Lipid panel     Status: Abnormal   Collection Time: 03/13/21  7:34 AM  Result Value Ref Range   Cholesterol 92 0 - 200 mg/dL    Comment: ATP III Classification       Desirable:  < 200 mg/dL               Borderline High:  200 - 239 mg/dL          High:  > = 240 mg/dL   Triglycerides 68.0 0.0 - 149.0 mg/dL    Comment: Normal:  <150 mg/dLBorderline High:  150 - 199 mg/dL   HDL 35.90 (L) >39.00 mg/dL   VLDL 13.6 0.0 - 40.0 mg/dL   LDL Cholesterol 43 0 - 99 mg/dL   Total CHOL/HDL Ratio 3     Comment:                Men          Women1/2 Average Risk     3.4          3.3Average Risk          5.0          4.42X Average Risk          9.6          7.13X Average Risk  15.0          11.0                       NonHDL 56.10     Comment: NOTE:  Non-HDL goal should be 30 mg/dL higher than patient's LDL goal (i.e. LDL goal of < 70 mg/dL, would have non-HDL goal of < 100 mg/dL)   Objective  Body mass index is 26.68 kg/m. Wt Readings from Last 3 Encounters:  05/22/21 219 lb 3.2 oz (99.4 kg)  04/09/21 216 lb (98 kg)  02/19/21 219 lb 3.2 oz (99.4 kg)   Temp Readings from Last 3 Encounters:  05/22/21 (!) 97 F (36.1 C) (Temporal)  02/19/21 98 F (36.7 C) (Oral)  02/11/21 (!) 97.5 F (36.4 C)   BP Readings from Last 3 Encounters:  05/22/21 120/64  04/09/21 (!) 116/58  02/19/21 126/66   Pulse Readings from Last 3 Encounters:  05/22/21 65  04/09/21 67  02/19/21 60    Physical Exam Vitals and nursing note reviewed.  Constitutional:      Appearance: Normal appearance. He is well-developed and well-groomed.  HENT:     Head: Normocephalic and  atraumatic.  Eyes:     Conjunctiva/sclera: Conjunctivae normal.     Pupils: Pupils are equal, round, and reactive to light.  Cardiovascular:     Rate and Rhythm: Normal rate and regular rhythm.     Heart sounds: Normal heart sounds.  Pulmonary:     Effort: Pulmonary effort is normal. No respiratory distress.     Breath sounds: Wheezing present.  Abdominal:     Tenderness: There is no abdominal tenderness.  Skin:    General: Skin is warm and moist.  Neurological:     General: No focal deficit present.     Mental Status: He is alert and oriented to person, place, and time. Mental status is at baseline.     Sensory: Sensation is intact.     Motor: Motor function is intact.     Coordination: Coordination is intact.     Gait: Gait is intact. Gait normal.  Psychiatric:        Attention and Perception: Attention and perception normal.        Mood and Affect: Mood and affect normal.        Speech: Speech normal.        Behavior: Behavior normal. Behavior is cooperative.        Thought Content: Thought content normal.        Cognition and Memory: Cognition and memory normal.        Judgment: Judgment normal.    Assessment  Plan  Emphysema Mild persistent asthma without complication - Plan: fluticasone (FLOVENT HFA) 220 MCG/ACT inhaler Inc from 110 bid 2 puffs still has cough and wheezing  Did not find spiriva 18 handihaler helpful consider respimat in the future  Prn albuterol   DDD (degenerative disc disease), lumbar  Memory loss Let me know if referral wanted pulm  Bilateral carotid artery stenosis - Plan: Lipid panel Aortic atherosclerosis ([place]) - Plan: Lipid panel Hyperlipidemia, unspecified hyperlipidemia type - Plan: Lipid panel Lipitior 80  Will see if Dr. Candiss Norse can do lipid 05/2021 with other renal labs    HM Flu shot utd pna 23 utd prevnar utd  Tdap utd 07/05/2018  Had 2/2 pfizer utd declines further Consider shingrix in future if not had    Hep C neg 10/07/13   Former smoker since  age 77 y.o to 23 max 2ppd  cologaurd 07/12/18 neg PSA  normal 06/28/18 0.38  [facility]erred [facility] but [facility] to [place]   Cards Dr. Raliegh Ip  Renal Dr. Candiss Norse  Vascular Dr. Lucky Cowboy  pulm Dr. Francesca Oman [facility] derm  Neurology Dr. Melrose Nakayama (2017) AE Dr. Wallace Going Provider: Dr. Olivia Mackie McLean-Scocuzza-Internal Medicine

## 2021-06-11 DIAGNOSIS — I701 Atherosclerosis of renal artery: Secondary | ICD-10-CM | POA: Diagnosis not present

## 2021-06-11 DIAGNOSIS — I10 Essential (primary) hypertension: Secondary | ICD-10-CM | POA: Diagnosis not present

## 2021-06-11 DIAGNOSIS — N1831 Chronic kidney disease, stage 3a: Secondary | ICD-10-CM | POA: Diagnosis not present

## 2021-06-20 DIAGNOSIS — I10 Essential (primary) hypertension: Secondary | ICD-10-CM | POA: Diagnosis not present

## 2021-06-20 DIAGNOSIS — E785 Hyperlipidemia, unspecified: Secondary | ICD-10-CM | POA: Diagnosis not present

## 2021-06-20 DIAGNOSIS — N1832 Chronic kidney disease, stage 3b: Secondary | ICD-10-CM | POA: Diagnosis not present

## 2021-06-20 DIAGNOSIS — I701 Atherosclerosis of renal artery: Secondary | ICD-10-CM | POA: Diagnosis not present

## 2021-07-02 ENCOUNTER — Encounter (INDEPENDENT_AMBULATORY_CARE_PROVIDER_SITE_OTHER): Payer: Self-pay

## 2021-07-02 ENCOUNTER — Ambulatory Visit (INDEPENDENT_AMBULATORY_CARE_PROVIDER_SITE_OTHER): Payer: Medicare Other | Admitting: Vascular Surgery

## 2021-07-02 ENCOUNTER — Encounter (INDEPENDENT_AMBULATORY_CARE_PROVIDER_SITE_OTHER): Payer: Medicare Other

## 2021-07-08 ENCOUNTER — Other Ambulatory Visit (INDEPENDENT_AMBULATORY_CARE_PROVIDER_SITE_OTHER): Payer: Self-pay | Admitting: Vascular Surgery

## 2021-07-15 ENCOUNTER — Encounter: Payer: Self-pay | Admitting: Internal Medicine

## 2021-07-17 DIAGNOSIS — Z1211 Encounter for screening for malignant neoplasm of colon: Secondary | ICD-10-CM | POA: Diagnosis not present

## 2021-07-25 LAB — COLOGUARD: COLOGUARD: NEGATIVE

## 2021-09-04 DIAGNOSIS — I700 Atherosclerosis of aorta: Secondary | ICD-10-CM | POA: Diagnosis not present

## 2021-09-04 DIAGNOSIS — Z8679 Personal history of other diseases of the circulatory system: Secondary | ICD-10-CM | POA: Diagnosis not present

## 2021-09-04 DIAGNOSIS — I2510 Atherosclerotic heart disease of native coronary artery without angina pectoris: Secondary | ICD-10-CM | POA: Diagnosis not present

## 2021-09-04 DIAGNOSIS — N1831 Chronic kidney disease, stage 3a: Secondary | ICD-10-CM | POA: Diagnosis not present

## 2021-09-04 DIAGNOSIS — Z7901 Long term (current) use of anticoagulants: Secondary | ICD-10-CM | POA: Diagnosis not present

## 2021-09-04 DIAGNOSIS — I6523 Occlusion and stenosis of bilateral carotid arteries: Secondary | ICD-10-CM | POA: Diagnosis not present

## 2021-09-04 DIAGNOSIS — I10 Essential (primary) hypertension: Secondary | ICD-10-CM | POA: Diagnosis not present

## 2021-09-04 DIAGNOSIS — Z951 Presence of aortocoronary bypass graft: Secondary | ICD-10-CM | POA: Diagnosis not present

## 2021-09-04 DIAGNOSIS — E785 Hyperlipidemia, unspecified: Secondary | ICD-10-CM | POA: Diagnosis not present

## 2021-09-11 DIAGNOSIS — R053 Chronic cough: Secondary | ICD-10-CM | POA: Diagnosis not present

## 2021-09-11 DIAGNOSIS — J310 Chronic rhinitis: Secondary | ICD-10-CM | POA: Diagnosis not present

## 2021-09-11 DIAGNOSIS — R059 Cough, unspecified: Secondary | ICD-10-CM | POA: Diagnosis not present

## 2021-09-11 DIAGNOSIS — R0609 Other forms of dyspnea: Secondary | ICD-10-CM | POA: Diagnosis not present

## 2021-09-11 DIAGNOSIS — J439 Emphysema, unspecified: Secondary | ICD-10-CM | POA: Diagnosis not present

## 2021-10-08 ENCOUNTER — Ambulatory Visit (INDEPENDENT_AMBULATORY_CARE_PROVIDER_SITE_OTHER): Payer: Medicare Other

## 2021-10-08 ENCOUNTER — Ambulatory Visit (INDEPENDENT_AMBULATORY_CARE_PROVIDER_SITE_OTHER): Payer: Medicare Other | Admitting: Vascular Surgery

## 2021-10-08 ENCOUNTER — Encounter (INDEPENDENT_AMBULATORY_CARE_PROVIDER_SITE_OTHER): Payer: Self-pay | Admitting: Vascular Surgery

## 2021-10-08 VITALS — BP 148/67 | HR 62 | Resp 16 | Wt 211.8 lb

## 2021-10-08 DIAGNOSIS — I10 Essential (primary) hypertension: Secondary | ICD-10-CM | POA: Diagnosis not present

## 2021-10-08 DIAGNOSIS — E785 Hyperlipidemia, unspecified: Secondary | ICD-10-CM | POA: Diagnosis not present

## 2021-10-08 DIAGNOSIS — I701 Atherosclerosis of renal artery: Secondary | ICD-10-CM

## 2021-10-08 DIAGNOSIS — I6523 Occlusion and stenosis of bilateral carotid arteries: Secondary | ICD-10-CM | POA: Diagnosis not present

## 2021-10-08 DIAGNOSIS — N1831 Chronic kidney disease, stage 3a: Secondary | ICD-10-CM

## 2021-10-08 NOTE — Progress Notes (Signed)
MRN : [id number]  Keith Cruz is a 81 y.o. ([birth year]) male who presents with chief complaint of  Chief Complaint  Patient presents with   Follow-up    Ultrasound follow up  .  History of Present Illness: Patient returns today in follow up of multiple vascular issues.  He has chronic kidney disease and had a right renal artery intervention many years ago.  He follows with his nephrologist and primary care physician and his renal function has been stable.  Blood pressure fluctuates but is under reasonable control. Duplex today shows normal renal artery velocities bilaterally without significant stenosis.  Kidney sizes are stable.  He has had a little more leg swelling lately.  Not having significantly more pain.  No ulceration or infection. He is also followed for significant carotid disease.  He underwent a redo right carotid endarterectomy at another institution a couple of years ago and still has difficulty with swallowing after that. No focal neurologic symptoms. I did a carotid stent on the left for recurrent stenosis over a year ago.  Duplex today reveals 1 to 39% carotid stenosis by velocities bilaterally without significant recurrent stenosis in either carotid artery.  Current Outpatient Medications  Medication Sig Dispense Refill   albuterol (PROAIR HFA) 108 (90 Base) MCG/ACT inhaler INHALE 1-2 PUFFS INTO THE LUNGS EVERY 4 (FOUR) HOURS AS NEEDED FOR WHEEZING OR SHORTNESS OF BREATH. 18 each 12   aspirin EC 81 MG tablet Take 81 mg by mouth daily.     atorvastatin (LIPITOR) 80 MG tablet Take 1 tablet (80 mg total) by mouth daily. 90 tablet 3   Cholecalciferol (VITAMIN D3) 2000 UNITS capsule Take 2,000 Units by mouth daily. am     ELIQUIS 5 MG TABS tablet TAKE 1 TABLET BY MOUTH TWICE DAILY 60 tablet 0   fexofenadine (ALLEGRA) 180 MG tablet Take 1 tablet (180 mg total) by mouth daily as needed for allergies or rhinitis. 90 tablet 3   fluticasone (FLOVENT HFA) 220 MCG/ACT inhaler Inhale  2 puffs into the lungs 2 (two) times daily. Rinse your mouth 12 each 11   folic acid (FOLVITE) 1 MG tablet Take 0.8 mg by mouth daily. am     glucose blood (ACCU-CHEK AVIVA PLUS) test strip USE TO CHECK BLOOD SUGAR ONCE DAILY (ICD-10: R73.03) 100 strip 5   hydrALAZINE (APRESOLINE) 50 MG tablet Take 1 tablet (50 mg total) by mouth 3 (three) times daily. 270 tablet 3   metoprolol tartrate (LOPRESSOR) 50 MG tablet Take 1 tablet (50 mg total) by mouth 2 (two) times daily. D/c coreg pt prefers metoprolol 180 tablet 3   Multiple Vitamins-Minerals (CENTRUM SILVER 50+MEN PO) Take by mouth.     pantoprazole (PROTONIX) 40 MG tablet Take 1 tablet (40 mg total) by mouth 2 (two) times daily before a meal. TRY TO TAKE ONLY 1X PER DAY INSTEAD OF 2 30 MIN BEFORE A MEAL 180 tablet 3   tamsulosin (FLOMAX) 0.4 MG CAPS capsule Take 1 capsule by mouth daily.     Current Facility-Administered Medications  Medication Dose Route Frequency Provider Last Rate Last Admin   ipratropium-albuterol (DUONEB) 0.5-2.5 (3) MG/3ML nebulizer solution 3 mL  3 mL Nebulization Once Olin Hauser, DO        Past Medical History:  Diagnosis Date   Arthritis    back   Asthma    Atherosclerosis of aorta (HCC)    Atherosclerosis of aorta ([place])    Bronchitis 05/2017   chronic  CAD (coronary artery disease)    Carotid artery disease (HCC)    Carotid stenosis    Carotid stenosis, bilateral    Chronic bronchitis (HCC)    Chronic cough    COPD (chronic obstructive pulmonary disease) (HCC)    Dysrhythmia    GERD (gastroesophageal reflux disease)    Heart murmur    HOH (hard of hearing)    hearing aids   Hypercholesteremia    Hypertension    Kidney disease    stent   Macular degeneration of left eye    Myocardial infarction ([place]) 2016   Pre-diabetes    Seasonal allergies    Seizure ([place])    Seizures ([place])    viral meningitis   Stroke [facility])    1991 and imaging 05/12/15 old strokes   TIA (transient ischemic  attack) 1991   x2   Uses hearing aid    Viral meningitis 2017   Viral meningitis    Wears dentures    upper and lower    Past Surgical History:  Procedure Laterality Date   CAROTID ENDARTERECTOMY Left 2014   CAROTID ENDARTERECTOMY Right 1991   CAROTID PTA/STENT INTERVENTION Left 05/31/2020   Procedure: CAROTID PTA/STENT INTERVENTIO;  Surgeon: Algernon Huxley, MD;  Location: [facility];  Service: Cardiovascular;  Laterality: Left;   CATARACT EXTRACTION W/PHACO Left 06/24/2017   Procedure: CATARACT EXTRACTION PHACO AND INTRAOCULAR LENS PLACEMENT ([place]) LEFT BORDERLINE DIABETIC;  Surgeon: Leandrew Koyanagi, MD;  Location: [facility];  Service: Ophthalmology;  Laterality: Left;   CATARACT EXTRACTION W/PHACO Right 07/22/2017   Procedure: CATARACT EXTRACTION PHACO AND INTRAOCULAR LENS PLACEMENT ([place]) RIGHT BORDERLINE DIABETIC;  Surgeon: Leandrew Koyanagi, MD;  Location: [facility];  Service: Ophthalmology;  Laterality: Right;   CORONARY ARTERY BYPASS GRAFT     x5   SKIN LESION EXCISION Left    left ear   URETERAL STENT PLACEMENT       Social History   Tobacco Use   Smoking status: Former    Packs/day: 2.00    Years: 10.00    Total pack years: 20.00    Types: Cigarettes    Quit date: 04/28/1970    Years since quitting: 51.4   Smokeless tobacco: Former  Scientific laboratory technician Use: Never used  Substance Use Topics   Alcohol use: No    Alcohol/week: 0.0 standard drinks of alcohol    Comment: previous   Drug use: No      Family History  Problem Relation Age of Onset   Stroke Mother    Heart attack Mother    Dementia Mother    Stroke Father    Stroke Paternal Grandmother    Stroke Paternal Grandfather    Alzheimer's disease Paternal Grandfather    Stroke Sister    Alzheimer's disease Sister    Dementia Sister    Stroke Brother    Alzheimer's disease Maternal Grandmother    Dementia Maternal Grandmother    Dementia Sister      Allergies   Allergen Reactions   Fexofenadine-Pseudoephed Er Swelling    Tongue and face. Only with D product. Tolerates plain allegra at home.  .    Nifedipine Swelling    Tongue and face     Pseudoephedrine Swelling   Allegra  [Fexofenadine] Swelling    Tongue and face. Only with D product. Tolerates plain allegra at home.    Anoro Ellipta [Umeclidinium-Vilanterol]    Carvedilol Other (See Comments)    Light headed,  heart racing, increase in blood pressure    Prednisone Other (See Comments)    Oral prednisone. Confusion. Mental status changes    REVIEW OF SYSTEMS (Negative unless checked)   Constitutional: '[]'$ Weight loss  '[]'$ Fever  '[]'$ Chills Cardiac: '[]'$ Chest pain   '[]'$ Chest pressure   '[]'$ Palpitations   '[]'$ Shortness of breath when laying flat   '[]'$ Shortness of breath at rest   '[]'$ Shortness of breath with exertion. Vascular:  '[]'$ Pain in legs with walking   '[]'$ Pain in legs at rest   '[]'$ Pain in legs when laying flat   '[]'$ Claudication   '[]'$ Pain in feet when walking  '[]'$ Pain in feet at rest  '[]'$ Pain in feet when laying flat   '[]'$ History of DVT   '[]'$ Phlebitis   '[]'$ Swelling in legs   '[]'$ Varicose veins   '[]'$ Non-healing ulcers Pulmonary:   '[]'$ Uses home oxygen   '[]'$ Productive cough   '[]'$ Hemoptysis   '[]'$ Wheeze  '[]'$ COPD   '[]'$ Asthma Neurologic:  '[]'$ Dizziness  '[]'$ Blackouts   '[]'$ Seizures   '[x]'$ History of stroke   '[]'$ History of TIA  '[]'$ Aphasia   '[]'$ Temporary blindness   '[]'$ Dysphagia   '[]'$ Weakness or numbness in arms   '[]'$ Weakness or numbness in legs Musculoskeletal:  '[x]'$ Arthritis   '[]'$ Joint swelling   '[x]'$ Joint pain   '[]'$ Low back pain Hematologic:  '[]'$ Easy bruising  '[]'$ Easy bleeding   '[]'$ Hypercoagulable state   '[]'$ Anemic   Gastrointestinal:  '[]'$ Blood in stool   '[]'$ Vomiting blood  '[]'$ Gastroesophageal reflux/heartburn   '[]'$ Abdominal pain Genitourinary:  '[x]'$ Chronic kidney disease   '[]'$ Difficult urination  '[]'$ Frequent urination  '[]'$ Burning with urination   '[]'$ Hematuria Skin:  '[]'$ Rashes   '[]'$ Ulcers   '[]'$ Wounds Psychological:  '[]'$ History of anxiety   '[]'$  History of major  depression.  Physical Examination  BP (!) 148/67 (BP Location: Right Arm)   Pulse 62   Resp 16   Wt 211 lb 12.8 oz (96.1 kg)   BMI 25.78 kg/m  Gen:  WD/WN, NAD Head: [place]/AT, No temporalis wasting. Ear/Nose/Throat: Hearing grossly intact, nares w/o erythema or drainage Eyes: Conjunctiva clear. Sclera non-icteric Neck: Supple.  Trachea midline Pulmonary:  Good air movement, no use of accessory muscles.  Cardiac: irregular Vascular:  Vessel Right Left  Radial Palpable Palpable           Musculoskeletal: M/S 5/5 throughout.  No deformity or atrophy. Trace BLE edema. Neurologic: Sensation grossly intact in extremities.  Symmetrical.  Speech is fluent.  Psychiatric: Judgment intact, Mood & affect appropriate for pt's clinical situation. Dermatologic: No rashes or ulcers noted.  No cellulitis or open wounds.      Labs Recent Results (from the past 2160 hour(s))  Cologuard     Status: None   Collection Time: 07/17/21  7:45 PM  Result Value Ref Range   COLOGUARD Negative Negative    Comment:  NEGATIVE TEST RESULT. A negative Cologuard result indicates a low likelihood that a colorectal cancer (CRC) or advanced adenoma (adenomatous polyps with more advanced pre-malignant features)  is present. The chance that a person with a negative Cologuard test has a colorectal cancer is less than 1 in 1500 (negative predictive value >99.9%) or has an  advanced adenoma is less than  5.3% (negative predictive value 94.7%). These data are based on a prospective cross-sectional study of 10,000 individuals at average risk for colorectal cancer who were screened with both Cologuard and colonoscopy. (Imperiale T. et al, N Engl J Med 2014;370(14):1286-1297) The normal value (reference range) for this assay is negative.  COLOGUARD RE-SCREENING RECOMMENDATION: Periodic colorectal cancer screening is an important part  of preventive healthcare for asymptomatic individuals at average risk for colorectal cancer.   Following a negative Cologuard result, the [place] Task Force screening guidelines recommend a Cologuard re-screening interval of 3 years.  References: American Cancer Society Guideline for Colorectal Cancer Screening: https://www.cancer.org/cancer/colon-rectal-cancer/detection-diagnosis-staging/acs-recommendations.html.; Rex DK, Boland CR, Dominitz JK, Colorectal Cancer Screening: Recommendations for Physicians and Patients from the [place] Task Force on Colorectal Cancer Screening , Am J Gastroenterology 2017; 563:1497-0263.  TEST DESCRIPTION: Composite algorithmic analysis of stool DNA-biomarkers with hemoglobin immunoassay.   Quantitative values of individual biomarkers are not reportable and are not associated with individual biomarker result reference ranges. Cologuard is intended for colorectal cancer screening of adults of either sex, 15 years or older, who are at average-risk for colorectal cancer (CRC). Cologuard has been approved for use by the U.S. FDA. The performance of Cologuard was  established in a cross sectional study of average-risk adults aged 31-84. Cologuard performance in patients ages 42 to 89 years was estimated by sub-group analysis of near-age groups. Colonoscopies performed for a positive result may find as the most clinically significant lesion: colorectal cancer [4.0%], advanced adenoma (including sessile serrated polyps greater than or equal to 1cm diameter) [20%] or non- advanced adenoma [31%]; or no colorectal neoplasia [45%]. These estimates are derived from a prospective cross-sectional screening study of 10,000 individuals at average risk for colorectal cancer who were screened with both Cologuard and colonoscopy. (Imperiale T. et al, Alison Stalling J Med 2014;370(14):1286-1297.) Cologuard may produce a false negative or false positive result (no colorectal cancer or precancerous polyp present at colonoscopy follow up). A negative  Cologuard test result does not guarantee the absence of CRC or advanced adenoma (pre-cancer). The current Cologuard  screening interval is every 3 years. Paramedic and [employer] developer). Cologuard performance data in a 10,000 patient pivotal study using colonoscopy as the reference method can be accessed at the following location: www.exactlabs.com/results. Additional description of the Cologuard test process, warnings and precautions can be found at www.cologuard.com.     Radiology No results found.  Assessment/Plan Benign essential HTN blood pressure control important in reducing the progression of atherosclerotic disease. On appropriate oral medications.     Renal artery stenosis (HCC) Duplex today shows normal renal artery velocities bilaterally without significant stenosis.  Kidney sizes are stable.  Doing well.  Recheck in 1 year.  Continue current medical regimen.   Chronic kidney disease, stage III (moderate) Avoid contrast unless absolutely necessary.  Can contribute to lower extremity swelling   Hyperlipidemia lipid control important in reducing the progression of atherosclerotic disease. Continue statin therapy  Bilateral carotid artery stenosis Duplex today reveals 1 to 39% carotid stenosis by velocities bilaterally without significant recurrent stenosis in either carotid artery.  Doing continue current medical regimen.  Recheck in 1 year.    Leotis Pain, MD  10/08/2021 9:49 AM    This note was created with Dragon medical transcription system.  Any errors from dictation are purely unintentional

## 2021-10-08 NOTE — Assessment & Plan Note (Signed)
Duplex today reveals 1 to 39% carotid stenosis by velocities bilaterally without significant recurrent stenosis in either carotid artery.  Doing continue current medical regimen.  Recheck in 1 year.

## 2021-11-06 ENCOUNTER — Ambulatory Visit: Payer: Medicare Other | Admitting: Dermatology

## 2021-11-06 DIAGNOSIS — L821 Other seborrheic keratosis: Secondary | ICD-10-CM

## 2021-11-06 DIAGNOSIS — L918 Other hypertrophic disorders of the skin: Secondary | ICD-10-CM | POA: Diagnosis not present

## 2021-11-06 DIAGNOSIS — D229 Melanocytic nevi, unspecified: Secondary | ICD-10-CM | POA: Diagnosis not present

## 2021-11-06 DIAGNOSIS — L578 Other skin changes due to chronic exposure to nonionizing radiation: Secondary | ICD-10-CM | POA: Diagnosis not present

## 2021-11-06 DIAGNOSIS — L820 Inflamed seborrheic keratosis: Secondary | ICD-10-CM | POA: Diagnosis not present

## 2021-11-06 DIAGNOSIS — L814 Other melanin hyperpigmentation: Secondary | ICD-10-CM

## 2021-11-06 DIAGNOSIS — L570 Actinic keratosis: Secondary | ICD-10-CM

## 2021-11-06 DIAGNOSIS — Z1283 Encounter for screening for malignant neoplasm of skin: Secondary | ICD-10-CM | POA: Diagnosis not present

## 2021-11-06 DIAGNOSIS — D1800 Hemangioma unspecified site: Secondary | ICD-10-CM | POA: Diagnosis not present

## 2021-11-06 NOTE — Progress Notes (Signed)
New Patient Visit  Subjective  Keith Cruz is a 81 y.o. male who presents for the following: Other (New patient - Spot of left collarbone that gets irritated when rubbed by shirt and a couple of rough spots on his nose. The patient presents for Upper Body Skin Exam (UBSE) for skin cancer screening and mole check.  The patient has spots, moles and lesions to be evaluated, some may be new or changing and the patient has concerns that these could be cancer./).  Accompanied by wife.  The following portions of the chart were reviewed this encounter and updated as appropriate:   Tobacco  Allergies  Meds  Problems  Med Hx  Surg Hx  Fam Hx     Review of Systems:  No other skin or systemic complaints except as noted in HPI or Assessment and Plan.  Objective  Well appearing patient in no apparent distress; mood and affect are within normal limits.  All skin waist up examined.  Left neck Fleshy, skin-colored pedunculated papules.    Face/ears (7) Erythematous thin papules/macules with gritty scale.   right chest Erythematous stuck-on, waxy papule or plaque   Assessment & Plan   Lentigines - Scattered tan macules - Due to sun exposure - Benign-appearing, observe - Recommend daily broad spectrum sunscreen SPF 30+ to sun-exposed areas, reapply every 2 hours as needed. - Call for any changes  Seborrheic Keratoses - Stuck-on, waxy, tan-brown papules and/or plaques  - Benign-appearing - Discussed benign etiology and prognosis. - Observe - Call for any changes  Melanocytic Nevi - Tan-brown and/or pink-flesh-colored symmetric macules and papules - Benign appearing on exam today - Observation - Call clinic for new or changing moles - Recommend daily use of broad spectrum spf 30+ sunscreen to sun-exposed areas.   Hemangiomas - Red papules - Discussed benign nature - Observe - Call for any changes  Actinic Damage - Chronic condition, secondary to cumulative UV/sun  exposure - diffuse scaly erythematous macules with underlying dyspigmentation - Recommend daily broad spectrum sunscreen SPF 30+ to sun-exposed areas, reapply every 2 hours as needed.  - Staying in the shade or wearing long sleeves, sun glasses (UVA+UVB protection) and wide brim hats (4-inch brim around the entire circumference of the hat) are also recommended for sun protection.  - Call for new or changing lesions.  Skin cancer screening performed today.  Skin tag Left neck Symptomatic, irritating, patient would like treated.  Destruction of lesion - Left neck Complexity: simple   Destruction method: cryotherapy   Informed consent: discussed and consent obtained   Timeout:  patient name, date of birth, surgical site, and procedure verified Lesion destroyed using liquid nitrogen: Yes   Region frozen until ice ball extended beyond lesion: Yes   Outcome: patient tolerated procedure well with no complications   Post-procedure details: wound care instructions given    AK (actinic keratosis) (7) Face/ears  Destruction of lesion - Face/ears Complexity: simple   Destruction method: cryotherapy   Informed consent: discussed and consent obtained   Timeout:  patient name, date of birth, surgical site, and procedure verified Lesion destroyed using liquid nitrogen: Yes   Region frozen until ice ball extended beyond lesion: Yes   Outcome: patient tolerated procedure well with no complications   Post-procedure details: wound care instructions given    Inflamed seborrheic keratosis right chest  Destruction of lesion - right chest Complexity: simple   Destruction method: cryotherapy   Informed consent: discussed and consent obtained   Timeout:  patient name, date of birth, surgical site, and procedure verified Lesion destroyed using liquid nitrogen: Yes   Region frozen until ice ball extended beyond lesion: Yes   Outcome: patient tolerated procedure well with no complications    Post-procedure details: wound care instructions given    Return in about 1 year (around 11/07/2022).  I, Ashok Cordia, CMA, am acting as scribe for Sarina Ser, MD . Documentation: I have reviewed the above documentation for accuracy and completeness, and I agree with the above.  Sarina Ser, MD

## 2021-11-06 NOTE — Patient Instructions (Signed)
Cryotherapy Aftercare  Wash gently with soap and water everyday.   Apply Vaseline and Band-Aid daily until healed.     Due to recent changes in healthcare laws, you may see results of your pathology and/or laboratory studies on MyChart before the doctors have had a chance to review them. We understand that in some cases there may be results that are confusing or concerning to you. Please understand that not all results are received at the same time and often the doctors may need to interpret multiple results in order to provide you with the best plan of care or course of treatment. Therefore, we ask that you please give us 2 business days to thoroughly review all your results before contacting the office for clarification. Should we see a critical lab result, you will be contacted sooner.   If You Need Anything After Your Visit  If you have any questions or concerns for your doctor, please call our main line at [phone] and press option 4 to reach your doctor's medical assistant. If no one answers, please leave a voicemail as directed and we will return your call as soon as possible. Messages left after 4 pm will be answered the following business day.   You may also send us a message via MyChart. We typically respond to MyChart messages within 1-2 business days.  For prescription refills, please ask your pharmacy to contact our office. Our fax number is [phone].  If you have an urgent issue when the clinic is closed that cannot wait until the next business day, you can page your doctor at the number below.    Please note that while we do our best to be available for urgent issues outside of office hours, we are not available 24/7.   If you have an urgent issue and are unable to reach us, you may choose to seek medical care at your doctor's office, retail clinic, urgent care center, or emergency room.  If you have a medical emergency, please immediately call 911 or go to the  emergency department.  Pager Numbers  - Dr. Kowalski: [phone]  - Dr. Moye: [phone]  - Dr. Stewart: [phone]  In the event of inclement weather, please call our main line at [phone] for an update on the status of any delays or closures.  Dermatology Medication Tips: Please keep the boxes that topical medications come in in order to help keep track of the instructions about where and how to use these. Pharmacies typically print the medication instructions only on the boxes and not directly on the medication tubes.   If your medication is too expensive, please contact our office at [phone] option 4 or send us a message through MyChart.   We are unable to tell what your co-pay for medications will be in advance as this is different depending on your insurance coverage. However, we may be able to find a substitute medication at lower cost or fill out paperwork to get insurance to cover a needed medication.   If a prior authorization is required to get your medication covered by your insurance company, please allow us 1-2 business days to complete this process.  Drug prices often vary depending on where the prescription is filled and some pharmacies may offer cheaper prices.  The website www.goodrx.com contains coupons for medications through different pharmacies. The prices here do not account for what the cost may be with help from insurance (it may be cheaper with your insurance), but the website can   give you the price if you did not use any insurance.  - You can print the associated coupon and take it with your prescription to the pharmacy.  - You may also stop by our office during regular business hours and pick up a GoodRx coupon card.  - If you need your prescription sent electronically to a different pharmacy, notify our office through [facility] MyChart or by phone at [phone] option 4.     Si Usted Necesita Algo Despus de Su Visita  Tambin puede  enviarnos un mensaje a travs de MyChart. Por lo general respondemos a los mensajes de MyChart en el transcurso de 1 a 2 das hbiles.  Para renovar recetas, por favor pida a su farmacia que se ponga en contacto con nuestra oficina. Nuestro nmero de fax es el [phone].  Si tiene un asunto urgente cuando la clnica est cerrada y que no puede esperar hasta el siguiente da hbil, puede llamar/localizar a su doctor(a) al nmero que aparece a continuacin.   Por favor, tenga en cuenta que aunque hacemos todo lo posible para estar disponibles para asuntos urgentes fuera del horario de oficina, no estamos disponibles las 24 horas del da, los 7 das de la semana.   Si tiene un problema urgente y no puede comunicarse con nosotros, puede optar por buscar atencin mdica  en el consultorio de su doctor(a), en una clnica privada, en un centro de atencin urgente o en una sala de emergencias.  Si tiene una emergencia mdica, por favor llame inmediatamente al 911 o vaya a la sala de emergencias.  Nmeros de bper  - Dr. Kowalski: [phone]  - Dra. Moye: [phone]  - Dra. Stewart: [phone]  En caso de inclemencias del tiempo, por favor llame a nuestra lnea principal al [phone] para una actualizacin sobre el estado de cualquier retraso o cierre.  Consejos para la medicacin en dermatologa: Por favor, guarde las cajas en las que vienen los medicamentos de uso tpico para ayudarle a seguir las instrucciones sobre dnde y cmo usarlos. Las farmacias generalmente imprimen las instrucciones del medicamento slo en las cajas y no directamente en los tubos del medicamento.   Si su medicamento es muy caro, por favor, pngase en contacto con nuestra oficina llamando al [phone] y presione la opcin 4 o envenos un mensaje a travs de MyChart.   No podemos decirle cul ser su copago por los medicamentos por adelantado ya que esto es diferente dependiendo de la cobertura de su seguro.  Sin embargo, es posible que podamos encontrar un medicamento sustituto a menor costo o llenar un formulario para que el seguro cubra el medicamento que se considera necesario.   Si se requiere una autorizacin previa para que su compaa de seguros cubra su medicamento, por favor permtanos de 1 a 2 das hbiles para completar este proceso.  Los precios de los medicamentos varan con frecuencia dependiendo del lugar de dnde se surte la receta y alguna farmacias pueden ofrecer precios ms baratos.  El sitio web www.goodrx.com tiene cupones para medicamentos de diferentes farmacias. Los precios aqu no tienen en cuenta lo que podra costar con la ayuda del seguro (puede ser ms barato con su seguro), pero el sitio web puede darle el precio si no utiliz ningn seguro.  - Puede imprimir el cupn correspondiente y llevarlo con su receta a la farmacia.  - Tambin puede pasar por nuestra oficina durante el horario de atencin regular y recoger una tarjeta de cupones de GoodRx.  -   Si necesita que su receta se enve electrnicamente a una farmacia diferente, informe a nuestra oficina a travs de MyChart de [place] o por telfono llamando al [phone] y presione la opcin 4.  

## 2021-11-11 ENCOUNTER — Ambulatory Visit: Payer: Medicare Other | Admitting: Dermatology

## 2021-11-13 ENCOUNTER — Encounter: Payer: Self-pay | Admitting: Dermatology

## 2021-11-19 ENCOUNTER — Ambulatory Visit: Payer: Medicare Other | Admitting: Internal Medicine

## 2021-11-22 ENCOUNTER — Ambulatory Visit (INDEPENDENT_AMBULATORY_CARE_PROVIDER_SITE_OTHER): Payer: Medicare Other | Admitting: Internal Medicine

## 2021-11-22 ENCOUNTER — Encounter: Payer: Self-pay | Admitting: Internal Medicine

## 2021-11-22 VITALS — BP 116/50 | HR 67 | Temp 97.7°F | Ht 76.0 in | Wt 214.8 lb

## 2021-11-22 DIAGNOSIS — H9193 Unspecified hearing loss, bilateral: Secondary | ICD-10-CM

## 2021-11-22 DIAGNOSIS — K219 Gastro-esophageal reflux disease without esophagitis: Secondary | ICD-10-CM

## 2021-11-22 DIAGNOSIS — J441 Chronic obstructive pulmonary disease with (acute) exacerbation: Secondary | ICD-10-CM

## 2021-11-22 DIAGNOSIS — J4530 Mild persistent asthma, uncomplicated: Secondary | ICD-10-CM

## 2021-11-22 DIAGNOSIS — I10 Essential (primary) hypertension: Secondary | ICD-10-CM

## 2021-11-22 DIAGNOSIS — J209 Acute bronchitis, unspecified: Secondary | ICD-10-CM | POA: Diagnosis not present

## 2021-11-22 DIAGNOSIS — J449 Chronic obstructive pulmonary disease, unspecified: Secondary | ICD-10-CM

## 2021-11-22 DIAGNOSIS — J440 Chronic obstructive pulmonary disease with acute lower respiratory infection: Secondary | ICD-10-CM

## 2021-11-22 MED ORDER — ATORVASTATIN CALCIUM 80 MG PO TABS: 80.0000 mg | ORAL_TABLET | Freq: Every day | ORAL | 3 refills | Status: DC

## 2021-11-22 MED ORDER — METOPROLOL TARTRATE 50 MG PO TABS: 50.0000 mg | ORAL_TABLET | Freq: Two times a day (BID) | ORAL | 3 refills | Status: DC

## 2021-11-22 MED ORDER — PANTOPRAZOLE SODIUM 40 MG PO TBEC: 40.0000 mg | DELAYED_RELEASE_TABLET | Freq: Two times a day (BID) | ORAL | 3 refills | Status: DC

## 2021-11-22 MED ORDER — HYDRALAZINE HCL 50 MG PO TABS: 50.0000 mg | ORAL_TABLET | Freq: Three times a day (TID) | ORAL | 3 refills | Status: DC

## 2021-11-22 MED ORDER — FEXOFENADINE HCL 180 MG PO TABS: 180.0000 mg | ORAL_TABLET | Freq: Every day | ORAL | 3 refills | Status: AC | PRN

## 2021-11-22 MED ORDER — FLUTICASONE PROPIONATE HFA 220 MCG/ACT IN AERO: 2.0000 | INHALATION_SPRAY | Freq: Two times a day (BID) | RESPIRATORY_TRACT | 11 refills | Status: DC

## 2021-11-22 MED ORDER — ALBUTEROL SULFATE HFA 108 (90 BASE) MCG/ACT IN AERS: INHALATION_SPRAY | RESPIRATORY_TRACT | 12 refills | Status: AC

## 2021-11-22 NOTE — Patient Instructions (Addendum)
12/26/2021 Office Visit Nephrology Murlean Iba, MD   [facility]   [place], [place] [address]   [phone] (Work)   [phone] (Fax)      Call me back if you change your mid about the ear doctor closer to you   [facility] (48) Hearing aid store in [place], [facility] [place]--Closed  Reviews Address: [address], [place], [place] [address] Phone: [phone]   1) [place] [phone]   2) [place] [address] [phone]

## 2021-11-22 NOTE — Progress Notes (Signed)
Chief Complaint  Patient presents with   Follow-up    6 month f/u with concerns about hearing aids hurting his ears so he has stopped wearing them.   F/u with wife  1. Hearing loss and popping and pain in ears does not think aids work so has taken them out and declines f/u ENT/audiology for now  Wife states he talks over people He has been exposed to loud noises tractors, grenades, gun shots over the years    Review of Systems  Constitutional:  Negative for weight loss.  HENT:  Negative for hearing loss.   Eyes:  Negative for blurred vision.  Respiratory:  Negative for shortness of breath.   Cardiovascular:  Negative for chest pain.  Gastrointestinal:  Negative for abdominal pain and blood in stool.  Genitourinary:  Negative for dysuria.  Musculoskeletal:  Negative for back pain, falls and joint pain.  Skin:  Negative for rash.  Neurological:  Negative for headaches.  Psychiatric/Behavioral:  Negative for depression.    Past Medical History:  Diagnosis Date   Arthritis    back   Asthma    Atherosclerosis of aorta (HCC)    Atherosclerosis of aorta (HCC)    Bronchitis 05/2017   chronic   CAD (coronary artery disease)    Carotid artery disease (HCC)    Carotid stenosis    Carotid stenosis, bilateral    Chronic bronchitis (HCC)    Chronic cough    COPD (chronic obstructive pulmonary disease) (HCC)    Dysrhythmia    GERD (gastroesophageal reflux disease)    Heart murmur    HOH (hard of hearing)    hearing aids   Hypercholesteremia    Hypertension    Kidney disease    stent   Macular degeneration of left eye    Myocardial infarction ([place]) 2016   Pre-diabetes    Seasonal allergies    Seizure ([place])    Seizures ([place])    viral meningitis   Stroke [facility])    1991 and imaging 05/12/15 old strokes   TIA (transient ischemic attack) 1991   x2   Uses hearing aid    Viral meningitis 2017   Viral meningitis    Wears dentures    upper and lower   Past Surgical History:   Procedure Laterality Date   CAROTID ENDARTERECTOMY Left 2014   CAROTID ENDARTERECTOMY Right 1991   CAROTID PTA/STENT INTERVENTION Left 05/31/2020   Procedure: CAROTID PTA/STENT INTERVENTIO;  Surgeon: Algernon Huxley, MD;  Location: [facility];  Service: Cardiovascular;  Laterality: Left;   CATARACT EXTRACTION W/PHACO Left 06/24/2017   Procedure: CATARACT EXTRACTION PHACO AND INTRAOCULAR LENS PLACEMENT ([place]) LEFT BORDERLINE DIABETIC;  Surgeon: Leandrew Koyanagi, MD;  Location: [facility];  Service: Ophthalmology;  Laterality: Left;   CATARACT EXTRACTION W/PHACO Right 07/22/2017   Procedure: CATARACT EXTRACTION PHACO AND INTRAOCULAR LENS PLACEMENT ([place]) RIGHT BORDERLINE DIABETIC;  Surgeon: Leandrew Koyanagi, MD;  Location: [facility];  Service: Ophthalmology;  Laterality: Right;   CORONARY ARTERY BYPASS GRAFT     x5   SKIN LESION EXCISION Left    left ear   URETERAL STENT PLACEMENT     Family History  Problem Relation Age of Onset   Stroke Mother    Heart attack Mother    Dementia Mother    Stroke Father    Stroke Paternal Grandmother    Stroke Paternal Grandfather    Alzheimer's disease Paternal Grandfather    Stroke Sister    Alzheimer's  disease Sister    Dementia Sister    Stroke Brother    Alzheimer's disease Maternal Grandmother    Dementia Maternal Grandmother    Dementia Sister    Social History   Socioeconomic History   Marital status: Married    Spouse name: Not on file   Number of children: Not on file   Years of education: Not on file   Highest education level: 10th grade  Occupational History   Not on file  Tobacco Use   Smoking status: Former    Packs/day: 2.00    Years: 10.00    Total pack years: 20.00    Types: Cigarettes    Quit date: 04/28/1970    Years since quitting: 51.6   Smokeless tobacco: Former  Scientific laboratory technician Use: Never used  Substance and Sexual Activity   Alcohol use: No    Alcohol/week: 0.0 standard  drinks of alcohol    Comment: previous   Drug use: No   Sexual activity: Yes    Comment: wife   Other Topics Concern   Not on file  Social History Narrative   Re-married x 3 years    Retired    Owns guns, wears seat belt, safe in relationship    2 sons and 1 daughter    6 years [employer]    Social Determinants of Health   Financial Resource Strain: Low Risk  (02/11/2021)   Overall Financial Resource Strain (CARDIA)    Difficulty of Paying Living Expenses: Not hard at all  Food Insecurity: No Food Insecurity (02/11/2021)   Hunger Vital Sign    Worried About Running Out of Food in the Last Year: Never true    [place] in the Last Year: Never true  Transportation Needs: No Transportation Needs (02/11/2021)   PRAPARE - Hydrologist (Medical): No    Lack of Transportation (Non-Medical): No  Physical Activity: Unknown (02/09/2020)   Exercise Vital Sign    Days of Exercise per Week: 0 days    Minutes of Exercise per Session: Not on file  Stress: No Stress Concern Present (02/11/2021)   [place]    Feeling of Stress : Not at all  Social Connections: Moderately Integrated (02/11/2021)   Social Connection and Isolation Panel [NHANES]    Frequency of Communication with Friends and Family: More than three times a week    Frequency of Social Gatherings with Friends and Family: More than three times a week    Attends Religious Services: More than 4 times per year    Active Member of [employer] or Organizations: No    Attends Archivist Meetings: Never    Marital Status: Married  Human resources officer Violence: Not At Risk (02/11/2021)   Humiliation, Afraid, Rape, and Kick questionnaire    Fear of Current or Ex-Partner: No    Emotionally Abused: No    Physically Abused: No    Sexually Abused: No   Current Meds  Medication Sig   aspirin EC 81 MG tablet Take 81 mg by mouth  daily.   Cholecalciferol (VITAMIN D3) 2000 UNITS capsule Take 2,000 Units by mouth daily. am   ELIQUIS 5 MG TABS tablet TAKE 1 TABLET BY MOUTH TWICE DAILY   folic acid (FOLVITE) 1 MG tablet Take 0.8 mg by mouth daily. am   glucose blood (ACCU-CHEK AVIVA PLUS) test strip USE TO CHECK BLOOD SUGAR ONCE DAILY (  ICD-10: R73.03)   Multiple Vitamins-Minerals (CENTRUM SILVER 50+MEN PO) Take by mouth.   tamsulosin (FLOMAX) 0.4 MG CAPS capsule Take 1 capsule by mouth daily.   [DISCONTINUED] albuterol (PROAIR HFA) 108 (90 Base) MCG/ACT inhaler INHALE 1-2 PUFFS INTO THE LUNGS EVERY 4 (FOUR) HOURS AS NEEDED FOR WHEEZING OR SHORTNESS OF BREATH.   [DISCONTINUED] atorvastatin (LIPITOR) 80 MG tablet Take 1 tablet (80 mg total) by mouth daily.   [DISCONTINUED] fexofenadine (ALLEGRA) 180 MG tablet Take 1 tablet (180 mg total) by mouth daily as needed for allergies or rhinitis.   [DISCONTINUED] fluticasone (FLOVENT HFA) 220 MCG/ACT inhaler Inhale 2 puffs into the lungs 2 (two) times daily. Rinse your mouth   [DISCONTINUED] hydrALAZINE (APRESOLINE) 50 MG tablet Take 1 tablet (50 mg total) by mouth 3 (three) times daily.   [DISCONTINUED] metoprolol tartrate (LOPRESSOR) 50 MG tablet Take 1 tablet (50 mg total) by mouth 2 (two) times daily. D/c coreg pt prefers metoprolol   [DISCONTINUED] pantoprazole (PROTONIX) 40 MG tablet Take 1 tablet (40 mg total) by mouth 2 (two) times daily before a meal. TRY TO TAKE ONLY 1X PER DAY INSTEAD OF 2 30 MIN BEFORE A MEAL   Current Facility-Administered Medications for the 11/22/21 encounter (Office Visit) with McLean-Scocuzza, Nino Glow, MD  Medication   ipratropium-albuterol (DUONEB) 0.5-2.5 (3) MG/3ML nebulizer solution 3 mL   Allergies  Allergen Reactions   Fexofenadine-Pseudoephed Er Swelling    Tongue and face. Only with D product. Tolerates plain allegra at home.  .    Nifedipine Swelling    Tongue and face     Pseudoephedrine Swelling   Allegra  [Fexofenadine] Swelling     Tongue and face. Only with D product. Tolerates plain allegra at home.    Anoro Ellipta [Umeclidinium-Vilanterol]    Carvedilol Other (See Comments)    Light headed, heart racing, increase in blood pressure    Prednisone Other (See Comments)    Oral prednisone. Confusion. Mental status changes   No results found for this or any previous visit (from the past 2160 hour(s)). Objective  Body mass index is 26.15 kg/m. Wt Readings from Last 3 Encounters:  11/22/21 214 lb 12.8 oz (97.4 kg)  10/08/21 211 lb 12.8 oz (96.1 kg)  05/22/21 219 lb 3.2 oz (99.4 kg)   Temp Readings from Last 3 Encounters:  11/22/21 97.7 F (36.5 C) (Oral)  05/22/21 (!) 97 F (36.1 C) (Temporal)  02/19/21 98 F (36.7 C) (Oral)   BP Readings from Last 3 Encounters:  11/22/21 (!) 116/50  10/08/21 (!) 148/67  05/22/21 120/64   Pulse Readings from Last 3 Encounters:  11/22/21 67  10/08/21 62  05/22/21 65    Physical Exam Vitals and nursing note reviewed.  Constitutional:      Appearance: Normal appearance. He is well-developed and well-groomed.  HENT:     Head: Normocephalic and atraumatic.  Eyes:     Conjunctiva/sclera: Conjunctivae normal.     Pupils: Pupils are equal, round, and reactive to light.  Cardiovascular:     Rate and Rhythm: Normal rate and regular rhythm.     Heart sounds: Normal heart sounds.  Pulmonary:     Effort: Pulmonary effort is normal. No respiratory distress.     Breath sounds: Normal breath sounds.  Abdominal:     Tenderness: There is no abdominal tenderness.  Skin:    General: Skin is warm and moist.  Neurological:     General: No focal deficit present.     Mental  Status: He is alert and oriented to person, place, and time. Mental status is at baseline.     Sensory: Sensation is intact.     Motor: Motor function is intact.     Coordination: Coordination is intact.     Gait: Gait is intact. Gait normal.  Psychiatric:        Attention and Perception: Attention and  perception normal.        Mood and Affect: Mood and affect normal.        Speech: Speech normal.        Behavior: Behavior normal. Behavior is cooperative.        Thought Content: Thought content normal.        Cognition and Memory: Cognition and memory normal.        Judgment: Judgment normal.     Assessment  Plan  Bilateral hearing loss, unspecified hearing loss type Declines ent for now  Call me back if you change your mid about the ear doctor closer to you   [facility] 5.0 (48) Hearing aid store in [place], [place]--Closed  Reviews Address: [address], [place], [place] [address] Phone: [phone]   1) [place] [phone]   2) [place] [address] [phone]  Mild persistent asthma without complication/copd- Plan: albuterol (PROAIR HFA) 108 (90 Base) MCG/ACT inhaler, fexofenadine (ALLEGRA) 180 MG tablet, fluticasone (FLOVENT HFA) 220 MCG/ACT inhaler  Chronic obstructive pulmonary disease, unspecified COPD type ([place]) - Plan: albuterol (PROAIR HFA) 108 (90 Base) MCG/ACT inhaler  Essential hypertension controlled- Plan: hydrALAZINE (APRESOLINE) 50 MG tablet tid  Benign essential HTN - Plan: metoprolol tartrate (LOPRESSOR) 50 MG tablet  Bid  Gastroesophageal reflux disease without esophagitis - Plan: pantoprazole (PROTONIX) 40 MG tablet    HM Flu shot utd pna 23 utd prevnar utd  Tdap utd 07/05/2018  Had 2/2 pfizer utd declines further Consider shingrix in future if not had    Hep C neg 10/07/13  Former smoker since age 3 y.o to 33 max 2ppd  cologaurd 07/17/21 negative PSA  normal 06/28/18 0.38  Referred asc prev est [facility] but they moved to [place]   Cards Dr. Raliegh Ip  Renal Dr. Candiss Norse  Vascular Dr. Lucky Cowboy  pulm Dr. Francesca Oman [facility] derm  Neurology Dr. Melrose Nakayama (2017) AE Dr. Wallace Going an-Scocuzza-Internal Medicine

## 2021-11-27 ENCOUNTER — Encounter: Payer: Self-pay | Admitting: Internal Medicine

## 2022-02-04 ENCOUNTER — Telehealth: Payer: Self-pay | Admitting: Pharmacy Technician

## 2022-02-04 DIAGNOSIS — Z596 Low income: Secondary | ICD-10-CM

## 2022-02-04 NOTE — Progress Notes (Signed)
[facility])                                            [place] Team    02/04/2022  Keith Cruz 06/22/1940 [id number]                                      Medication Assistance Referral  Referral From: [facility]   Medication/Company: Eliquis / BMS Patient application portion:  Mailed Provider application portion: Faxed  to Dr. Carollee Leitz Provider address/fax verified via: Office website    [name] [name], [place]  [phone]

## 2022-02-05 ENCOUNTER — Other Ambulatory Visit: Payer: Self-pay | Admitting: Internal Medicine

## 2022-02-05 DIAGNOSIS — R7303 Prediabetes: Secondary | ICD-10-CM

## 2022-02-07 ENCOUNTER — Telehealth: Payer: Self-pay | Admitting: Internal Medicine

## 2022-02-07 NOTE — Telephone Encounter (Signed)
Copied from [place] [id number]. Topic: Medicare AWV >> Feb 07, 2022 11:04 AM Jae Dire wrote: Reason for CRM:  Left message for patient to call back and schedule Medicare Annual Wellness Visit (AWV) in office.   If unable to come into the office for AWV,  please offer to do virtually or by telephone.  Last AWV: 02/11/2021  Please schedule at anytime with [place].  30 minute appointment for Virtual or phone 45 minute appointment for in office or Initial virtual/phone  Any questions, please contact me at [phone]

## 2022-02-14 ENCOUNTER — Ambulatory Visit (INDEPENDENT_AMBULATORY_CARE_PROVIDER_SITE_OTHER): Payer: Medicare Other

## 2022-02-14 ENCOUNTER — Other Ambulatory Visit: Payer: Self-pay | Admitting: Family Medicine

## 2022-02-14 VITALS — Ht 76.0 in | Wt 214.0 lb

## 2022-02-14 DIAGNOSIS — Z0000 Encounter for general adult medical examination without abnormal findings: Secondary | ICD-10-CM

## 2022-02-14 NOTE — Patient Instructions (Addendum)
Keith Cruz , Thank you for taking time to come for your Medicare Wellness Visit. I appreciate your ongoing commitment to your health goals. Please review the following plan we discussed and let me know if I can assist you in the future.   These are the goals we discussed:  Goals      Follow up with Primary Care Provider     As needed Snack healthy Stay active         This is a list of the screening recommended for you and due dates:  Health Maintenance  Topic Date Due   Zoster (Shingles) Vaccine (1 of 2) Never done   COVID-19 Vaccine (3 - Pfizer risk series) 05/29/2022*   Tetanus Vaccine  07/04/2028   Pneumonia Vaccine  Completed   Flu Shot  Completed   HPV Vaccine  Aged Out  *Topic was postponed. The date shown is not the original due date.    Advanced directives: End of life planning; Advance aging; Advanced directives discussed.  Copy of current HCPOA/Living Will requested.    Next appointment: Follow up in one year for your annual wellness visit.   Preventive Care 81 Years and Older, Male  Preventive care refers to lifestyle choices and visits with your health care provider that can promote health and wellness. What does preventive care include? A yearly physical exam. This is also called an annual well check. Dental exams once or twice a year. Routine eye exams. Ask your health care provider how often you should have your eyes checked. Personal lifestyle choices, including: Daily care of your teeth and gums. Regular physical activity. Eating a healthy diet. Avoiding tobacco and drug use. Limiting alcohol use. Practicing safe sex. Taking low doses of aspirin every day. Taking vitamin and mineral supplements as recommended by your health care provider. What happens during an annual well check? The services and screenings done by your health care provider during your annual well check will depend on your age, overall health, lifestyle risk factors, and family history  of disease. Counseling  Your health care provider may ask you questions about your: Alcohol use. Tobacco use. Drug use. Emotional well-being. Home and relationship well-being. Sexual activity. Eating habits. History of falls. Memory and ability to understand (cognition). Work and work Statistician. Screening  You may have the following tests or measurements: Height, weight, and BMI. Blood pressure. Lipid and cholesterol levels. These may be checked every 5 years, or more frequently if you are over 33 years old. Skin check. Lung cancer screening. You may have this screening every year starting at age 89 if you have a 30-pack-year history of smoking and currently smoke or have quit within the past 15 years. Fecal occult blood test (FOBT) of the stool. You may have this test every year starting at age 47. Flexible sigmoidoscopy or colonoscopy. You may have a sigmoidoscopy every 5 years or a colonoscopy every 10 years starting at age 16. Prostate cancer screening. Recommendations will vary depending on your family history and other risks. Hepatitis C blood test. Hepatitis B blood test. Sexually transmitted disease (STD) testing. Diabetes screening. This is done by checking your blood sugar (glucose) after you have not eaten for a while (fasting). You may have this done every 1-3 years. Abdominal aortic aneurysm (AAA) screening. You may need this if you are a current or former smoker. Osteoporosis. You may be screened starting at age 85 if you are at high risk. Talk with your health care provider about your  test results, treatment options, and if necessary, the need for more tests. Vaccines  Your health care provider may recommend certain vaccines, such as: Influenza vaccine. This is recommended every year. Tetanus, diphtheria, and acellular pertussis (Tdap, Td) vaccine. You may need a Td booster every 10 years. Zoster vaccine. You may need this after age 56. Pneumococcal 13-valent  conjugate (PCV13) vaccine. One dose is recommended after age 27. Pneumococcal polysaccharide (PPSV23) vaccine. One dose is recommended after age 59. Talk to your health care provider about which screenings and vaccines you need and how often you need them. This information is not intended to replace advice given to you by your health care provider. Make sure you discuss any questions you have with your health care provider. Document Released: 05/11/2015 Document Revised: 01/02/2016 Document Reviewed: 02/13/2015 Elsevier Interactive Patient Education  2017 [employer] Prevention in the Home Falls can cause injuries. They can happen to people of all ages. There are many things you can do to make your home safe and to help prevent falls. What can I do on the outside of my home? Regularly fix the edges of walkways and driveways and fix any cracks. Remove anything that might make you trip as you walk through a door, such as a raised step or threshold. Trim any bushes or trees on the path to your home. Use bright outdoor lighting. Clear any walking paths of anything that might make someone trip, such as rocks or tools. Regularly check to see if handrails are loose or broken. Make sure that both sides of any steps have handrails. Any raised decks and porches should have guardrails on the edges. Have any leaves, snow, or ice cleared regularly. Use sand or salt on walking paths during winter. Clean up any spills in your garage right away. This includes oil or grease spills. What can I do in the bathroom? Use night lights. Install grab bars by the toilet and in the tub and shower. Do not use towel bars as grab bars. Use non-skid mats or decals in the tub or shower. If you need to sit down in the shower, use a plastic, non-slip stool. Keep the floor dry. Clean up any water that spills on the floor as soon as it happens. Remove soap buildup in the tub or shower regularly. Attach bath mats  securely with double-sided non-slip rug tape. Do not have throw rugs and other things on the floor that can make you trip. What can I do in the bedroom? Use night lights. Make sure that you have a light by your bed that is easy to reach. Do not use any sheets or blankets that are too big for your bed. They should not hang down onto the floor. Have a firm chair that has side arms. You can use this for support while you get dressed. Do not have throw rugs and other things on the floor that can make you trip. What can I do in the kitchen? Clean up any spills right away. Avoid walking on wet floors. Keep items that you use a lot in easy-to-reach places. If you need to reach something above you, use a strong step stool that has a grab bar. Keep electrical cords out of the way. Do not use floor polish or wax that makes floors slippery. If you must use wax, use non-skid floor wax. Do not have throw rugs and other things on the floor that can make you trip. What can I do with my  stairs? Do not leave any items on the stairs. Make sure that there are handrails on both sides of the stairs and use them. Fix handrails that are broken or loose. Make sure that handrails are as long as the stairways. Check any carpeting to make sure that it is firmly attached to the stairs. Fix any carpet that is loose or worn. Avoid having throw rugs at the top or bottom of the stairs. If you do have throw rugs, attach them to the floor with carpet tape. Make sure that you have a light switch at the top of the stairs and the bottom of the stairs. If you do not have them, ask someone to add them for you. What else can I do to help prevent falls? Wear shoes that: Do not have high heels. Have rubber bottoms. Are comfortable and fit you well. Are closed at the toe. Do not wear sandals. If you use a stepladder: Make sure that it is fully opened. Do not climb a closed stepladder. Make sure that both sides of the stepladder  are locked into place. Ask someone to hold it for you, if possible. Clearly mark and make sure that you can see: Any grab bars or handrails. First and last steps. Where the edge of each step is. Use tools that help you move around (mobility aids) if they are needed. These include: Canes. Walkers. Scooters. Crutches. Turn on the lights when you go into a dark area. Replace any light bulbs as soon as they burn out. Set up your furniture so you have a clear path. Avoid moving your furniture around. If any of your floors are uneven, fix them. If there are any pets around you, be aware of where they are. Review your medicines with your doctor. Some medicines can make you feel dizzy. This can increase your chance of falling. Ask your doctor what other things that you can do to help prevent falls. This information is not intended to replace advice given to you by your health care provider. Make sure you discuss any questions you have with your health care provider. Document Released: 02/08/2009 Document Revised: 09/20/2015 Document Reviewed: 05/19/2014 Elsevier Interactive Patient Education  2017 [employer].

## 2022-02-14 NOTE — Addendum Note (Signed)
Addended by: Leta Jungling on: 02/14/2022 04:54 PM   Modules accepted: Orders

## 2022-02-14 NOTE — Progress Notes (Signed)
   Subjective:   Keith Cruz is a 81 y.o. male who presents for Medicare Annual/Subsequent preventive examination.  Second note to reflect updated diabetic information.    Diabetes: No (Pre-diabetic. Followed by PCP)  Patient is not classified as diabetic. Prediabetic only. Monitors his BS daily according to previous primary care provider, not by the order of Dr. Linus Orn Mclean-Scocuzza.  Patient reports no longer receiving diabetic device/strips due to no longer making per pharmacy. New Rx required to continue monitoring daily.  Patient is scheduled for transfer of care with Dr. Carollee Leitz in January and okay to address next in office visit.      Patient Care Team: McLean-Scocuzza, Nino Glow, MD as PCP - General (Internal Medicine)      Leta Jungling, LPN   31/03/1623

## 2022-02-14 NOTE — Progress Notes (Signed)
Subjective:   Keith Cruz is a 81 y.o. male who presents for Medicare Annual/Subsequent preventive examination.  Review of Systems    No ROS.  Medicare Wellness Virtual Visit.  Visual/audio telehealth visit, UTA vital signs.   See social history for additional risk factors.   Cardiac Risk Factors include: advanced age (>66mn, >>15women);male gender;diabetes mellitus     Objective:    Today's Vitals   02/14/22 1120  Weight: 214 lb (97.1 kg)  Height: '6\' 4"'$  (1.93 m)   Body mass index is 26.05 kg/m.     02/14/2022   11:30 AM 02/11/2021    9:14 AM 05/31/2020    4:18 PM 02/09/2020    9:21 AM 07/17/2019   11:22 AM 02/08/2019    9:30 AM 01/19/2018   10:48 AM  Advanced Directives  Does Patient Have a Medical Advance Directive? Yes Yes No Yes No Yes Yes  Type of AParamedicof [place] will [place] will  [place]  [place] will Living will;Healthcare Power of Attorney  Does patient want to make changes to medical advance directive? No - Patient declined No - Patient declined  No - Patient declined  No - Patient declined   Copy of [place] Chart? No - copy requested No - copy requested  No - copy requested  No - copy requested No - copy requested  Would patient like information on creating a medical advance directive?   No - Patient declined        Current Medications (verified) Outpatient Encounter Medications as of 02/14/2022  Medication Sig   albuterol (PROAIR HFA) 108 (90 Base) MCG/ACT inhaler INHALE 1-2 PUFFS INTO THE LUNGS EVERY 4 (FOUR) HOURS AS NEEDED FOR WHEEZING OR SHORTNESS OF BREATH.   aspirin EC 81 MG tablet Take 81 mg by mouth daily.   atorvastatin (LIPITOR) 80 MG tablet Take 1 tablet (80 mg total) by mouth daily.   Cholecalciferol (VITAMIN D3) 2000 UNITS capsule Take 2,000 Units by mouth daily. am   ELIQUIS 5 MG TABS tablet  TAKE 1 TABLET BY MOUTH TWICE DAILY   fexofenadine (ALLEGRA) 180 MG tablet Take 1 tablet (180 mg total) by mouth daily as needed for allergies or rhinitis.   fluticasone (FLOVENT HFA) 220 MCG/ACT inhaler Inhale 2 puffs into the lungs 2 (two) times daily. Rinse your mouth   folic acid (FOLVITE) 1 MG tablet Take 0.8 mg by mouth daily. am   glucose blood (ACCU-CHEK AVIVA PLUS) test strip USE TO CHECK BLOOD SUGAR ONCE DAILY   hydrALAZINE (APRESOLINE) 50 MG tablet Take 1 tablet (50 mg total) by mouth 3 (three) times daily.   metoprolol tartrate (LOPRESSOR) 50 MG tablet Take 1 tablet (50 mg total) by mouth 2 (two) times daily. D/c coreg pt prefers metoprolol   Multiple Vitamins-Minerals (CENTRUM SILVER 50+MEN PO) Take by mouth.   pantoprazole (PROTONIX) 40 MG tablet Take 1 tablet (40 mg total) by mouth 2 (two) times daily before a meal. TRY TO TAKE ONLY 1X PER DAY INSTEAD OF 2 30 MIN BEFORE A MEAL   tamsulosin (FLOMAX) 0.4 MG CAPS capsule Take 1 capsule by mouth daily.   Facility-Administered Encounter Medications as of 02/14/2022  Medication   ipratropium-albuterol (DUONEB) 0.5-2.5 (3) MG/3ML nebulizer solution 3 mL    Allergies (verified) Fexofenadine-pseudoephed er, Nifedipine, Pseudoephedrine, Allegra  [fexofenadine], Anoro ellipta [umeclidinium-vilanterol], Carvedilol, and Prednisone   History: Past Medical History:  Diagnosis  Date   Arthritis    back   Asthma    Atherosclerosis of aorta (HCC)    Atherosclerosis of aorta ([place])    Bronchitis 05/2017   chronic   CAD (coronary artery disease)    Carotid artery disease (HCC)    Carotid stenosis    Carotid stenosis, bilateral    Chronic bronchitis (HCC)    Chronic cough    COPD (chronic obstructive pulmonary disease) (HCC)    Dysrhythmia    GERD (gastroesophageal reflux disease)    Heart murmur    HOH (hard of hearing)    hearing aids   Hypercholesteremia    Hypertension    Kidney disease    stent   Macular degeneration of left  eye    Myocardial infarction ([place]) 2016   Pre-diabetes    Seasonal allergies    Seizure ([place])    Seizures ([place])    viral meningitis   Stroke [facility])    1991 and imaging 05/12/15 old strokes   TIA (transient ischemic attack) 1991   x2   Uses hearing aid    Viral meningitis 2017   Viral meningitis    Wears dentures    upper and lower   Past Surgical History:  Procedure Laterality Date   CAROTID ENDARTERECTOMY Left 2014   CAROTID ENDARTERECTOMY Right 1991   CAROTID PTA/STENT INTERVENTION Left 05/31/2020   Procedure: CAROTID PTA/STENT INTERVENTIO;  Surgeon: Algernon Huxley, MD;  Location: [place] [facility];  Service: Cardiovascular;  Laterality: Left;   CATARACT EXTRACTION W/PHACO Left 06/24/2017   Procedure: CATARACT EXTRACTION PHACO AND INTRAOCULAR LENS PLACEMENT ([place]) LEFT BORDERLINE DIABETIC;  Surgeon: Leandrew Koyanagi, MD;  Location: [place];  Service: Ophthalmology;  Laterality: Left;   CATARACT EXTRACTION W/PHACO Right 07/22/2017   Procedure: CATARACT EXTRACTION PHACO AND INTRAOCULAR LENS PLACEMENT ([place]) RIGHT BORDERLINE DIABETIC;  Surgeon: Leandrew Koyanagi, MD;  Location: [place];  Service: Ophthalmology;  Laterality: Right;   CORONARY ARTERY BYPASS GRAFT     x5   SKIN LESION EXCISION Left    left ear   URETERAL STENT PLACEMENT     Family History  Problem Relation Age of Onset   Stroke Mother    Heart attack Mother    Dementia Mother    Stroke Father    Stroke Paternal Grandmother    Stroke Paternal Grandfather    Alzheimer's disease Paternal Grandfather    Stroke Sister    Alzheimer's disease Sister    Dementia Sister    Stroke Brother    Alzheimer's disease Maternal Grandmother    Dementia Maternal Grandmother    Dementia Sister    Social History   Socioeconomic History   Marital status: Married    Spouse name: Not on file   Number of children: Not on file   Years of education: Not on file   Highest education level: 10th grade   Occupational History   Not on file  Tobacco Use   Smoking status: Former    Packs/day: 2.00    Years: 10.00    Total pack years: 20.00    Types: Cigarettes    Quit date: 04/28/1970    Years since quitting: 51.8   Smokeless tobacco: Former  Scientific laboratory technician Use: Never used  Substance and Sexual Activity   Alcohol use: No    Alcohol/week: 0.0 standard drinks of alcohol    Comment: previous   Drug use: No   Sexual activity: Yes    Comment:  wife   Other Topics Concern   Not on file  Social History Narrative   Re-married x 3 years    Retired    Owns guns, wears seat belt, safe in relationship    2 sons and 1 daughter    6 years [employer]    Social Determinants of Health   Financial Resource Strain: Low Risk  (02/14/2022)   Overall Financial Resource Strain (CARDIA)    Difficulty of Paying Living Expenses: Not hard at all  Food Insecurity: No Food Insecurity (02/14/2022)   Hunger Vital Sign    Worried About Running Out of Food in the Last Year: Never true    [place] in the Last Year: Never true  Transportation Needs: No Transportation Needs (02/14/2022)   PRAPARE - Hydrologist (Medical): No    Lack of Transportation (Non-Medical): No  Physical Activity: Insufficiently Active (02/14/2022)   Exercise Vital Sign    Days of Exercise per Week: 2 days    Minutes of Exercise per Session: 20 min  Stress: No Stress Concern Present (02/14/2022)   [place]    Feeling of Stress : Not at all  Social Connections: Moderately Integrated (02/14/2022)   Social Connection and Isolation Panel [NHANES]    Frequency of Communication with Friends and Family: More than three times a week    Frequency of Social Gatherings with Friends and Family: More than three times a week    Attends Religious Services: More than 4 times per year    Active Member of [employer] or Organizations: No     Attends Archivist Meetings: Never    Marital Status: Married    Tobacco Counseling Counseling given: Not Answered   Clinical Intake:  Pre-visit preparation completed: Yes        Diabetes: Yes (Followed by PCP)  Nutrition Risk Assessment: Does the patient have any non-healing wounds?  No  Has the patient had any unintentional weight loss or weight gain?  No   Diabetes: Is the patient diabetic?  Yes  If diabetic, was a CBG obtained today?  No . Checked yesterday @ 120. Did the patient bring in their glucometer from home?  No  How often do you monitor your CBG's? daily.   Financial Strains and Diabetes Management: Are you having any financial strains with the device, your supplies or your medication? No  diabetic financial strain with device, supplies, medication.  Needs new Rx for device and strips sent to pharmacy.  Patient reports diabetic device and strips are no longer made via pharmacy. Patient currently has no more strips to use. Deferred to DOD for follow up.   Does the patient want to be seen by Chronic Care Management for management of their diabetes?  No  Would the patient like to be referred to a Nutritionist or for Diabetic Management?  No     How often do you need to have someone help you when you read instructions, pamphlets, or other written materials from your doctor or pharmacy?: 1 - Never    Interpreter Needed?: No      Activities of Daily Living    02/14/2022   11:25 AM  In your present state of health, do you have any difficulty performing the following activities:  Hearing? 0  Vision? 0  Difficulty concentrating or making decisions? 0  Walking or climbing stairs? 0  Dressing or bathing? 0  Doing  errands, shopping? 0  Preparing Food and eating ? N  Using the Toilet? N  In the past six months, have you accidently leaked urine? N  Do you have problems with loss of bowel control? N  Managing your Medications? N  Managing your  Finances? Y  Comment Son Writer or managing your Housekeeping? N    Patient Care Team: McLean-Scocuzza, Nino Glow, MD as PCP - General (Internal Medicine)  Indicate any recent Medical Services you may have received from other than [facility] providers in the past year (date may be approximate).     Assessment:   This is a routine wellness examination for [place].  I connected with  Allyne Gee on 02/14/22 by a audio enabled telemedicine application and verified that I am speaking with the correct person using two identifiers.  Patient Location: Home  Provider Location: Office/Clinic  I discussed the limitations of evaluation and management by telemedicine. The patient expressed understanding and agreed to proceed.   Hearing/Vision screen Hearing Screening - Comments:: Hearing aid, bilateral  Does not wear them often Vision Screening - Comments:: Wears corrective lenses They have seen their ophthalmologist in the last 12 months.  Dry Macular degeneration   Dietary issues and exercise activities discussed: Current Exercise Habits: Home exercise routine, Type of exercise: walking;calisthenics (Walking 1 mile daily), Time (Minutes): 15, Frequency (Times/Week): 2, Weekly Exercise (Minutes/Week): 30, Intensity: Mild   Goals Addressed             This Visit's Progress    Follow up with Primary Care Provider   On track    As needed Snack healthy Stay active        Depression Screen    02/14/2022   11:25 AM 11/22/2021    1:38 PM 02/11/2021    9:16 AM 02/09/2020    9:14 AM 09/02/2019    2:04 PM 06/01/2019    1:17 PM 02/08/2019    9:14 AM  PHQ 2/9 Scores  PHQ - 2 Score 0 4 0 0 0 0 0  PHQ- 9 Score  13    0     Fall Risk    02/14/2022   11:24 AM 11/22/2021    1:36 PM 02/19/2021   11:35 AM 02/11/2021    9:15 AM 02/09/2020    9:13 AM  Fall Risk   Falls in the past year? 1 1 0 0 0  Number falls in past yr: 0 0 0 0 0  Comment Fell on L knee. Did not seek  medical attention.      Injury with Fall? 0 1 0    Risk for fall due to :   No Fall Risks    Follow up Falls evaluation completed  Falls evaluation completed Falls evaluation completed Falls evaluation completed    [place]: Home free of loose throw rugs in walkways, pet beds, electrical cords, etc? Yes  Adequate lighting in your home to reduce risk of falls? Yes   ASSISTIVE DEVICES UTILIZED TO PREVENT FALLS: Life alert? No  Use of a cane, walker or w/c? No  Grab bars in the bathroom? No  Shower chair or bench in shower? No  Elevated toilet seat or a handicapped toilet? No   TIMED UP AND GO: Was the test performed? No .   Cognitive Function:        02/14/2022   11:39 AM 02/11/2021   10:37 AM 02/08/2019    9:26 AM 01/19/2018  10:50 AM 12/09/2016   10:53 AM  6CIT Screen  What Year? 0 points 0 points 0 points 0 points 0 points  What month? 0 points 0 points 0 points 0 points 0 points  What time? 0 points 0 points 0 points 0 points 0 points  Count back from 20 0 points  0 points 0 points 0 points  Months in reverse 0 points 0 points 0 points 0 points 0 points  Repeat phrase 0 points  0 points 2 points 2 points  Total Score 0 points  0 points 2 points 2 points    Immunizations Immunization History  Administered Date(s) Administered   Fluad Quad(high Dose 65+) 12/27/2021   Influenza, High Dose Seasonal PF 01/22/2015, 12/31/2017, 12/15/2018, 12/15/2019, 12/27/2020   Influenza-Unspecified 02/23/2015, 01/03/2017, 12/31/2017, 12/15/2018   PFIZER(Purple Top)SARS-COV-2 Vaccination 06/27/2019, 07/18/2019   Pneumococcal Conjugate-13 12/10/2015   Pneumococcal Polysaccharide-23 05/11/2018   Respiratory Syncytial Virus Vaccine,Recomb Aduvanted(Arexvy) 12/27/2021   Tdap 07/05/2018   Flu Vaccine status: Due, Education has been provided regarding the importance of this vaccine. Advised may receive this vaccine at local pharmacy or Health Dept. Aware  to provide a copy of the vaccination record if obtained from local pharmacy or Health Dept. Verbalized acceptance and understanding.  Shingrix Completed?: No.    Education has been provided regarding the importance of this vaccine. Patient has been advised to call insurance company to determine out of pocket expense if they have not yet received this vaccine. Advised may also receive vaccine at local pharmacy or Health Dept. Verbalized acceptance and understanding.  Screening Tests Health Maintenance  Topic Date Due   Zoster Vaccines- Shingrix (1 of 2) Never done   COVID-19 Vaccine (3 - Pfizer risk series) 05/29/2022 (Originally 08/15/2019)   TETANUS/TDAP  07/04/2028   Pneumonia Vaccine 93+ Years old  Completed   INFLUENZA VACCINE  Completed   HPV VACCINES  Aged Out    Health Maintenance Health Maintenance Due  Topic Date Due   Zoster Vaccines- Shingrix (1 of 2) Never done   Lung Cancer Screening: (Low Dose CT Chest recommended if Age 59-80 years, 30 pack-year currently smoking OR have quit w/in 15years.) does not qualify.   Hepatitis C Screening: does not qualify.  Vision Screening: Recommended annual ophthalmology exams for early detection of glaucoma and other disorders of the eye.  Dental Screening: Recommended annual dental exams for proper oral hygiene  Community Resource Referral / Chronic Care Management: CRR required this visit?  No   CCM required this visit?  No      Plan:     I have personally reviewed and noted the following in the patient's chart:   Medical and social history Use of alcohol, tobacco or illicit drugs  Current medications and supplements including opioid prescriptions. Patient is not currently taking opioid prescriptions. Functional ability and status Nutritional status Physical activity Advanced directives List of other physicians Hospitalizations, surgeries, and ER visits in previous 12 months Vitals Screenings to include cognitive,  depression, and falls Referrals and appointments  In addition, I have reviewed and discussed with patient certain preventive protocols, quality metrics, and best practice recommendations. A written personalized care plan for preventive services as well as general preventive health recommendations were provided to patient.     Leta Jungling, LPN   75/17/0017

## 2022-02-24 ENCOUNTER — Encounter (INDEPENDENT_AMBULATORY_CARE_PROVIDER_SITE_OTHER): Payer: Self-pay

## 2022-03-12 DIAGNOSIS — J302 Other seasonal allergic rhinitis: Secondary | ICD-10-CM | POA: Diagnosis not present

## 2022-03-12 DIAGNOSIS — R058 Other specified cough: Secondary | ICD-10-CM | POA: Diagnosis not present

## 2022-03-12 DIAGNOSIS — J439 Emphysema, unspecified: Secondary | ICD-10-CM | POA: Diagnosis not present

## 2022-03-31 ENCOUNTER — Other Ambulatory Visit (INDEPENDENT_AMBULATORY_CARE_PROVIDER_SITE_OTHER): Payer: Self-pay | Admitting: Vascular Surgery

## 2022-04-02 ENCOUNTER — Telehealth: Payer: Self-pay | Admitting: Pharmacy Technician

## 2022-04-02 DIAGNOSIS — Z596 Low income: Secondary | ICD-10-CM

## 2022-04-02 NOTE — Progress Notes (Signed)
[facility])                                            [place] Team    04/02/2022  Keith Cruz 17-Mar-1941 [id number]  Received both patient and provider portion(s) of patient assistance application(s) for Eliquis. Faxed completed application and required documents into BMS.  [name] P. [name], [place]  [phone]

## 2022-04-04 ENCOUNTER — Telehealth: Payer: Self-pay | Admitting: Internal Medicine

## 2022-04-04 NOTE — Telephone Encounter (Signed)
Ben from [employer] called stating he need clarity on directions for [facility] for the pt Fax [phone]

## 2022-04-07 ENCOUNTER — Encounter: Payer: Self-pay | Admitting: Pharmacy Technician

## 2022-04-07 DIAGNOSIS — Z596 Low income: Secondary | ICD-10-CM

## 2022-04-07 NOTE — Progress Notes (Addendum)
[facility])                                            [place] Team    04/07/2022  Keith Cruz [birth year] [id number]  Care coordination call placed to [employer] in regard to Eliquis application.   Spoke to [employer] who informs patient is APPROVED 04/03/22-04/27/22. She informs a 90 supply of medication will be delivered to his home.  Deanne Bedgood P. Omar Gayden, [place]  [phone]

## 2022-04-07 NOTE — Telephone Encounter (Signed)
The medication with the the sig has been faxed to [phone].

## 2022-04-30 ENCOUNTER — Other Ambulatory Visit (INDEPENDENT_AMBULATORY_CARE_PROVIDER_SITE_OTHER): Payer: Self-pay | Admitting: Vascular Surgery

## 2022-05-04 DIAGNOSIS — M545 Low back pain, unspecified: Secondary | ICD-10-CM | POA: Diagnosis not present

## 2022-05-09 DIAGNOSIS — M545 Low back pain, unspecified: Secondary | ICD-10-CM | POA: Insufficient documentation

## 2022-05-13 ENCOUNTER — Ambulatory Visit (INDEPENDENT_AMBULATORY_CARE_PROVIDER_SITE_OTHER): Payer: Medicare Other | Admitting: Nurse Practitioner

## 2022-05-13 ENCOUNTER — Encounter: Payer: Self-pay | Admitting: Nurse Practitioner

## 2022-05-13 VITALS — BP 118/60 | HR 66 | Temp 97.5°F | Ht 76.0 in | Wt 210.2 lb

## 2022-05-13 DIAGNOSIS — N1831 Chronic kidney disease, stage 3a: Secondary | ICD-10-CM | POA: Diagnosis not present

## 2022-05-13 DIAGNOSIS — R7303 Prediabetes: Secondary | ICD-10-CM | POA: Diagnosis not present

## 2022-05-13 DIAGNOSIS — Z1329 Encounter for screening for other suspected endocrine disorder: Secondary | ICD-10-CM

## 2022-05-13 DIAGNOSIS — Z125 Encounter for screening for malignant neoplasm of prostate: Secondary | ICD-10-CM

## 2022-05-13 DIAGNOSIS — E559 Vitamin D deficiency, unspecified: Secondary | ICD-10-CM | POA: Diagnosis not present

## 2022-05-13 DIAGNOSIS — I10 Essential (primary) hypertension: Secondary | ICD-10-CM

## 2022-05-13 DIAGNOSIS — E785 Hyperlipidemia, unspecified: Secondary | ICD-10-CM

## 2022-05-13 DIAGNOSIS — L259 Unspecified contact dermatitis, unspecified cause: Secondary | ICD-10-CM | POA: Insufficient documentation

## 2022-05-13 LAB — CBC WITH DIFFERENTIAL/PLATELET
Basophils Absolute: 0 10*3/uL (ref 0.0–0.1)
Basophils Relative: 0.6 % (ref 0.0–3.0)
Eosinophils Absolute: 0.1 10*3/uL (ref 0.0–0.7)
Eosinophils Relative: 2 % (ref 0.0–5.0)
HCT: 36.8 % — ABNORMAL LOW (ref 39.0–52.0)
Hemoglobin: 12.5 g/dL — ABNORMAL LOW (ref 13.0–17.0)
Lymphocytes Relative: 19.1 % (ref 12.0–46.0)
Lymphs Abs: 1.3 10*3/uL (ref 0.7–4.0)
MCHC: 33.9 g/dL (ref 30.0–36.0)
MCV: 110.9 fl — ABNORMAL HIGH (ref 78.0–100.0)
Monocytes Absolute: 0.5 10*3/uL (ref 0.1–1.0)
Monocytes Relative: 6.8 % (ref 3.0–12.0)
Neutro Abs: 5 10*3/uL (ref 1.4–7.7)
Neutrophils Relative %: 71.5 % (ref 43.0–77.0)
Platelets: 198 10*3/uL (ref 150.0–400.0)
RBC: 3.32 Mil/uL — ABNORMAL LOW (ref 4.22–5.81)
RDW: 14.5 % (ref 11.5–15.5)
WBC: 7 10*3/uL (ref 4.0–10.5)

## 2022-05-13 LAB — COMPREHENSIVE METABOLIC PANEL
ALT: 19 U/L (ref 0–53)
AST: 25 U/L (ref 0–37)
Albumin: 4.2 g/dL (ref 3.5–5.2)
Alkaline Phosphatase: 45 U/L (ref 39–117)
BUN: 26 mg/dL — ABNORMAL HIGH (ref 6–23)
CO2: 28 mEq/L (ref 19–32)
Calcium: 9.2 mg/dL (ref 8.4–10.5)
Chloride: 104 mEq/L (ref 96–112)
Creatinine, Ser: 1.61 mg/dL — ABNORMAL HIGH (ref 0.40–1.50)
GFR: 39.74 mL/min — ABNORMAL LOW (ref >60.00)
Glucose, Bld: 142 mg/dL — ABNORMAL HIGH (ref 70–99)
Potassium: 4.6 mEq/L (ref 3.5–5.1)
Sodium: 140 mEq/L (ref 135–145)
Total Bilirubin: 0.4 mg/dL (ref 0.2–1.2)
Total Protein: 7.1 g/dL (ref 6.0–8.3)

## 2022-05-13 LAB — TSH: TSH: 2.31 u[IU]/mL (ref 0.35–5.50)

## 2022-05-13 LAB — VITAMIN D 25 HYDROXY (VIT D DEFICIENCY, FRACTURES): VITD: 55.36 ng/mL (ref 30.00–100.00)

## 2022-05-13 LAB — LIPID PANEL
Cholesterol: 101 mg/dL (ref 0–200)
HDL: 34.3 mg/dL — ABNORMAL LOW (ref >39.00)
LDL Cholesterol: 39 mg/dL (ref 0–99)
NonHDL: 66.4
Total CHOL/HDL Ratio: 3
Triglycerides: 136 mg/dL (ref 0.0–149.0)
VLDL: 27.2 mg/dL (ref 0.0–40.0)

## 2022-05-13 LAB — HEMOGLOBIN A1C: Hgb A1c MFr Bld: 6.1 % (ref 4.6–6.5)

## 2022-05-13 LAB — PSA: PSA: 0.32 ng/mL (ref 0.10–4.00)

## 2022-05-13 MED ORDER — TRIAMCINOLONE ACETONIDE 0.1 % EX CREA: 1.0000 | TOPICAL_CREAM | Freq: Two times a day (BID) | CUTANEOUS | 0 refills | Status: DC

## 2022-05-13 MED ORDER — BLOOD GLUCOSE METER KIT: PACK | 0 refills | Status: DC

## 2022-05-13 NOTE — Progress Notes (Signed)
Tomasita Morrow, NP-C Phone: [phone]  Keith Cruz is a 82 y.o. male who presents today for transfer of care. He is concerned about a dry patch of skin located on his left upper inner arm. He reports it has been there x 2 weeks and is itchy. He states it has not changed in shape or size. The itching comes and goes. Denies any new changes in soaps, lotions, detergents.   HYPERTENSION Disease Monitoring: Blood pressure range-Not regularly checking Chest pain- No      Dyspnea- No Medications: Compliance- Lopressor and Hydralazine Lightheadedness- No   Edema- No  PREDIABETES Disease Monitoring: Blood Sugar ranges-Not checking, needs new meter Polyuria/phagia/dipsia- No      Optho- Due Medications: Compliance- Diet controlled Hypoglycemic symptoms- No  HYPERLIPIDEMIA Disease Monitoring: See symptoms for Hypertension Medications: Compliance- Lipitor Right upper quadrant pain- No  Muscle aches- No  COPD: Medication compliance- Flovent and Proair  Rescue inhaler use- Not often Dyspnea- No  Wheezing- No  Cough- No  Productive- No   Social History   Tobacco Use  Smoking Status Former   Packs/day: 2.00   Years: 10.00   Total pack years: 20.00   Types: Cigarettes   Quit date: 04/28/1970   Years since quitting: 52.0  Smokeless Tobacco Former    Current Outpatient Medications on File Prior to Visit  Medication Sig Dispense Refill   albuterol (PROAIR HFA) 108 (90 Base) MCG/ACT inhaler INHALE 1-2 PUFFS INTO THE LUNGS EVERY 4 (FOUR) HOURS AS NEEDED FOR WHEEZING OR SHORTNESS OF BREATH. 18 each 12   apixaban (ELIQUIS) 5 MG TABS tablet TAKE 1 TABLET BY MOUTH TWICE DAILY 60 tablet 5   aspirin EC 81 MG tablet Take 81 mg by mouth daily.     atorvastatin (LIPITOR) 80 MG tablet Take 1 tablet (80 mg total) by mouth daily. 90 tablet 3   Cholecalciferol (VITAMIN D3) 2000 UNITS capsule Take 2,000 Units by mouth daily. am     fexofenadine (ALLEGRA) 180 MG tablet Take 1 tablet (180 mg total) by  mouth daily as needed for allergies or rhinitis. 90 tablet 3   fluticasone (FLOVENT HFA) 220 MCG/ACT inhaler Inhale 2 puffs into the lungs 2 (two) times daily. Rinse your mouth 12 each 11   folic acid (FOLVITE) 1 MG tablet Take 0.8 mg by mouth daily. am     hydrALAZINE (APRESOLINE) 50 MG tablet Take 1 tablet (50 mg total) by mouth 3 (three) times daily. 270 tablet 3   metoprolol tartrate (LOPRESSOR) 50 MG tablet Take 1 tablet (50 mg total) by mouth 2 (two) times daily. D/c coreg pt prefers metoprolol 180 tablet 3   Multiple Vitamins-Minerals (CENTRUM SILVER 50+MEN PO) Take by mouth.     pantoprazole (PROTONIX) 40 MG tablet Take 1 tablet (40 mg total) by mouth 2 (two) times daily before a meal. TRY TO TAKE ONLY 1X PER DAY INSTEAD OF 2 30 MIN BEFORE A MEAL 180 tablet 3   tamsulosin (FLOMAX) 0.4 MG CAPS capsule Take 1 capsule by mouth daily.     tiZANidine HCl (ZANAFLEX PO) Take 1 tablet by mouth at bedtime as needed (muscle spasms).     Current Facility-Administered Medications on File Prior to Visit  Medication Dose Route Frequency Provider Last Rate Last Admin   ipratropium-albuterol (DUONEB) 0.5-2.5 (3) MG/3ML nebulizer solution 3 mL  3 mL Nebulization Once Karamalegos, Alexander J, DO         ROS see history of present illness  Objective  Physical Exam Vitals:  05/13/22 1301  BP: 118/60  Pulse: 66  Temp: (!) 97.5 F (36.4 C)  SpO2: 96%    BP Readings from Last 3 Encounters:  05/13/22 118/60  11/22/21 (!) 116/50  10/08/21 (!) 148/67   Wt Readings from Last 3 Encounters:  05/13/22 210 lb 3.2 oz (95.3 kg)  02/14/22 214 lb (97.1 kg)  11/22/21 214 lb 12.8 oz (97.4 kg)    Physical Exam Constitutional:      General: He is not in acute distress.    Appearance: Normal appearance.  HENT:     Head: Normocephalic.  Cardiovascular:     Rate and Rhythm: Normal rate and regular rhythm.     Heart sounds: Normal heart sounds.  Pulmonary:     Effort: Pulmonary effort is normal.      Breath sounds: Normal breath sounds.  Abdominal:     General: Abdomen is flat. Bowel sounds are normal.     Palpations: Abdomen is soft.  Skin:    General: Skin is warm and dry.     Findings: Lesion (small dry patch x 1 noted on left upper inner arm.) present. No erythema.  Neurological:     Mental Status: He is alert.  Psychiatric:        Mood and Affect: Mood normal.        Behavior: Behavior normal.    Assessment/Plan: Please see individual problem list.  Contact dermatitis, unspecified contact dermatitis type, unspecified trigger Assessment & Plan: Small dry patch of skin noted on exam. Will trial Kenalog cream. Encouraged patient to follow up with Derm if area persists.   Orders: -     Triamcinolone Acetonide; Apply 1 Application topically 2 (two) times daily.  Dispense: 30 g; Refill: 0  Essential hypertension Assessment & Plan: Chronic. Stable on Lopressor 50 mg BID and Hydralazine 50 mg TID. Continue. Labs as outlined. Follow up with Cardiology.   Orders: -     CBC with Differential/Platelet -     Comprehensive metabolic panel  Hyperlipidemia, unspecified hyperlipidemia type Assessment & Plan: Chronic. Stable on Lipitor 80 mg daily. Continue. Will check lipids today.  Orders: -     Lipid panel  Pre-diabetes Assessment & Plan: Diet controlled. Will check A1c today. Encouraged healthy diet and exercise. Has been unable to check blood sugars daily due to broken meter, will send Rx for new supplies.   Orders: -     Hemoglobin A1c -     blood glucose meter kit and supplies; Dispense based on patient and insurance preference. Use as directed to check blood glucose once daily.  Dispense: 1 each; Refill: 0  Stage 3a chronic kidney disease ([place]) Assessment & Plan: Will check CMP today. Follow up with Nephrology.   Orders: -     Comprehensive metabolic panel  Vitamin D deficiency -     VITAMIN D 25 Hydroxy (Vit-D Deficiency, Fractures)  Screening PSA (prostate  specific antigen) -     PSA  Thyroid disorder screen -     TSH   Return in about 6 months (around 11/11/2022).   Tomasita Morrow, NP-C [place]

## 2022-05-13 NOTE — Assessment & Plan Note (Addendum)
Chronic. Stable on Lopressor 50 mg BID and Hydralazine 50 mg TID. Continue. Labs as outlined. Follow up with Cardiology.

## 2022-05-13 NOTE — Assessment & Plan Note (Signed)
Diet controlled. Will check A1c today. Encouraged healthy diet and exercise. Has been unable to check blood sugars daily due to broken meter, will send Rx for new supplies.

## 2022-05-13 NOTE — Assessment & Plan Note (Signed)
Will check CMP today. Follow up with Nephrology.

## 2022-05-13 NOTE — Assessment & Plan Note (Signed)
Chronic. Stable on Lipitor 80 mg daily. Continue. Will check lipids today.

## 2022-05-13 NOTE — Assessment & Plan Note (Addendum)
Small dry patch of skin noted on exam. Will trial Kenalog cream. Encouraged patient to follow up with Derm if area persists.

## 2022-05-14 ENCOUNTER — Other Ambulatory Visit: Payer: Self-pay

## 2022-05-14 ENCOUNTER — Other Ambulatory Visit: Payer: Self-pay | Admitting: Nurse Practitioner

## 2022-05-14 ENCOUNTER — Other Ambulatory Visit (INDEPENDENT_AMBULATORY_CARE_PROVIDER_SITE_OTHER): Payer: Medicare Other

## 2022-05-14 DIAGNOSIS — R718 Other abnormality of red blood cells: Secondary | ICD-10-CM

## 2022-05-14 DIAGNOSIS — R7303 Prediabetes: Secondary | ICD-10-CM

## 2022-05-14 LAB — B12 AND FOLATE PANEL
Folate: >23.8 ng/mL (ref >5.9)
Vitamin B-12: 353 pg/mL (ref 211–911)

## 2022-05-14 MED ORDER — ACCU-CHEK SOFTCLIX LANCETS MISC: 12 refills | Status: DC

## 2022-05-14 MED ORDER — GLUCOSE BLOOD VI STRP: ORAL_STRIP | 12 refills | Status: DC

## 2022-05-15 ENCOUNTER — Other Ambulatory Visit: Payer: Self-pay | Admitting: Nurse Practitioner

## 2022-05-15 ENCOUNTER — Telehealth: Payer: Self-pay

## 2022-05-15 NOTE — Telephone Encounter (Signed)
Called to speak with wife to see if she was able to pick up his glucose meter kit, she confirmed she just picked it up and that she wanted me to tell Tomasita Morrow , NP that she would like for his referral to hematologist to be in [place] not [place] and she does not want him to see Dr. Nada Libman at the cancer center.

## 2022-05-16 ENCOUNTER — Telehealth: Payer: Self-pay | Admitting: *Deleted

## 2022-05-16 NOTE — Telephone Encounter (Signed)
Nurse placed call to patient to review appointment details for upcoming new patient consultation visit. Patient answered the phone and appointment relayed to patient by nurse. Patient seemed confused about appointment and states he will have his wife call back if she has any questions.

## 2022-05-18 DIAGNOSIS — J189 Pneumonia, unspecified organism: Secondary | ICD-10-CM | POA: Insufficient documentation

## 2022-05-19 ENCOUNTER — Inpatient Hospital Stay: Payer: Medicare Other

## 2022-05-19 ENCOUNTER — Inpatient Hospital Stay: Payer: Medicare Other | Admitting: Internal Medicine

## 2022-05-19 DIAGNOSIS — J9691 Respiratory failure, unspecified with hypoxia: Secondary | ICD-10-CM | POA: Diagnosis not present

## 2022-05-19 DIAGNOSIS — R9431 Abnormal electrocardiogram [ECG] [EKG]: Secondary | ICD-10-CM | POA: Diagnosis not present

## 2022-05-19 DIAGNOSIS — E785 Hyperlipidemia, unspecified: Secondary | ICD-10-CM | POA: Diagnosis not present

## 2022-05-19 DIAGNOSIS — I447 Left bundle-branch block, unspecified: Secondary | ICD-10-CM | POA: Diagnosis not present

## 2022-05-19 DIAGNOSIS — I2510 Atherosclerotic heart disease of native coronary artery without angina pectoris: Secondary | ICD-10-CM | POA: Diagnosis not present

## 2022-05-19 DIAGNOSIS — N183 Chronic kidney disease, stage 3 unspecified: Secondary | ICD-10-CM | POA: Diagnosis not present

## 2022-05-19 DIAGNOSIS — R059 Cough, unspecified: Secondary | ICD-10-CM | POA: Diagnosis not present

## 2022-05-19 DIAGNOSIS — Z7902 Long term (current) use of antithrombotics/antiplatelets: Secondary | ICD-10-CM | POA: Diagnosis not present

## 2022-05-19 DIAGNOSIS — R918 Other nonspecific abnormal finding of lung field: Secondary | ICD-10-CM | POA: Diagnosis not present

## 2022-05-19 DIAGNOSIS — I129 Hypertensive chronic kidney disease with stage 1 through stage 4 chronic kidney disease, or unspecified chronic kidney disease: Secondary | ICD-10-CM | POA: Diagnosis not present

## 2022-05-19 DIAGNOSIS — J449 Chronic obstructive pulmonary disease, unspecified: Secondary | ICD-10-CM | POA: Diagnosis not present

## 2022-05-19 DIAGNOSIS — D631 Anemia in chronic kidney disease: Secondary | ICD-10-CM | POA: Diagnosis not present

## 2022-05-19 DIAGNOSIS — Z20822 Contact with and (suspected) exposure to covid-19: Secondary | ICD-10-CM | POA: Diagnosis not present

## 2022-05-19 DIAGNOSIS — R7303 Prediabetes: Secondary | ICD-10-CM | POA: Diagnosis not present

## 2022-05-19 DIAGNOSIS — Z792 Long term (current) use of antibiotics: Secondary | ICD-10-CM | POA: Diagnosis not present

## 2022-05-19 DIAGNOSIS — N189 Chronic kidney disease, unspecified: Secondary | ICD-10-CM | POA: Diagnosis not present

## 2022-05-19 DIAGNOSIS — I4891 Unspecified atrial fibrillation: Secondary | ICD-10-CM | POA: Diagnosis not present

## 2022-05-19 DIAGNOSIS — I1310 Hypertensive heart and chronic kidney disease without heart failure, with stage 1 through stage 4 chronic kidney disease, or unspecified chronic kidney disease: Secondary | ICD-10-CM | POA: Diagnosis not present

## 2022-05-19 DIAGNOSIS — Z79899 Other long term (current) drug therapy: Secondary | ICD-10-CM | POA: Diagnosis not present

## 2022-05-19 DIAGNOSIS — Z7982 Long term (current) use of aspirin: Secondary | ICD-10-CM | POA: Diagnosis not present

## 2022-05-26 ENCOUNTER — Encounter: Payer: Medicare Other | Admitting: Family Medicine

## 2022-05-26 DIAGNOSIS — H35372 Puckering of macula, left eye: Secondary | ICD-10-CM | POA: Diagnosis not present

## 2022-05-26 DIAGNOSIS — H26492 Other secondary cataract, left eye: Secondary | ICD-10-CM | POA: Diagnosis not present

## 2022-05-26 DIAGNOSIS — H35342 Macular cyst, hole, or pseudohole, left eye: Secondary | ICD-10-CM | POA: Diagnosis not present

## 2022-05-26 DIAGNOSIS — E119 Type 2 diabetes mellitus without complications: Secondary | ICD-10-CM | POA: Diagnosis not present

## 2022-05-26 LAB — HM DIABETES EYE EXAM

## 2022-05-27 DIAGNOSIS — N1831 Chronic kidney disease, stage 3a: Secondary | ICD-10-CM | POA: Diagnosis not present

## 2022-05-27 DIAGNOSIS — I10 Essential (primary) hypertension: Secondary | ICD-10-CM | POA: Diagnosis not present

## 2022-05-27 DIAGNOSIS — I129 Hypertensive chronic kidney disease with stage 1 through stage 4 chronic kidney disease, or unspecified chronic kidney disease: Secondary | ICD-10-CM | POA: Diagnosis not present

## 2022-05-27 DIAGNOSIS — I701 Atherosclerosis of renal artery: Secondary | ICD-10-CM | POA: Diagnosis not present

## 2022-05-28 ENCOUNTER — Inpatient Hospital Stay: Payer: Medicare Other

## 2022-05-28 ENCOUNTER — Encounter: Payer: Self-pay | Admitting: Internal Medicine

## 2022-05-28 ENCOUNTER — Inpatient Hospital Stay: Payer: Medicare Other | Attending: Internal Medicine | Admitting: Internal Medicine

## 2022-05-28 VITALS — BP 119/56 | HR 64 | Temp 97.6°F | Resp 18 | Ht 76.0 in | Wt 207.0 lb

## 2022-05-28 DIAGNOSIS — J449 Chronic obstructive pulmonary disease, unspecified: Secondary | ICD-10-CM | POA: Diagnosis not present

## 2022-05-28 DIAGNOSIS — I4891 Unspecified atrial fibrillation: Secondary | ICD-10-CM | POA: Diagnosis not present

## 2022-05-28 DIAGNOSIS — Z7982 Long term (current) use of aspirin: Secondary | ICD-10-CM | POA: Diagnosis not present

## 2022-05-28 DIAGNOSIS — D649 Anemia, unspecified: Secondary | ICD-10-CM | POA: Diagnosis not present

## 2022-05-28 DIAGNOSIS — R634 Abnormal weight loss: Secondary | ICD-10-CM | POA: Diagnosis not present

## 2022-05-28 DIAGNOSIS — E1122 Type 2 diabetes mellitus with diabetic chronic kidney disease: Secondary | ICD-10-CM | POA: Insufficient documentation

## 2022-05-28 DIAGNOSIS — Z87891 Personal history of nicotine dependence: Secondary | ICD-10-CM | POA: Insufficient documentation

## 2022-05-28 DIAGNOSIS — Z7901 Long term (current) use of anticoagulants: Secondary | ICD-10-CM | POA: Diagnosis not present

## 2022-05-28 DIAGNOSIS — N183 Chronic kidney disease, stage 3 unspecified: Secondary | ICD-10-CM | POA: Insufficient documentation

## 2022-05-28 DIAGNOSIS — I129 Hypertensive chronic kidney disease with stage 1 through stage 4 chronic kidney disease, or unspecified chronic kidney disease: Secondary | ICD-10-CM | POA: Insufficient documentation

## 2022-05-28 DIAGNOSIS — Z7951 Long term (current) use of inhaled steroids: Secondary | ICD-10-CM | POA: Insufficient documentation

## 2022-05-28 DIAGNOSIS — I2510 Atherosclerotic heart disease of native coronary artery without angina pectoris: Secondary | ICD-10-CM | POA: Insufficient documentation

## 2022-05-28 DIAGNOSIS — Z79899 Other long term (current) drug therapy: Secondary | ICD-10-CM | POA: Insufficient documentation

## 2022-05-28 DIAGNOSIS — D539 Nutritional anemia, unspecified: Secondary | ICD-10-CM | POA: Insufficient documentation

## 2022-05-28 DIAGNOSIS — E7800 Pure hypercholesterolemia, unspecified: Secondary | ICD-10-CM | POA: Diagnosis not present

## 2022-05-28 DIAGNOSIS — I252 Old myocardial infarction: Secondary | ICD-10-CM | POA: Insufficient documentation

## 2022-05-28 LAB — CBC WITH DIFFERENTIAL/PLATELET
Abs Immature Granulocytes: 0 10*3/uL (ref 0.00–0.07)
Band Neutrophils: 0 %
Basophils Absolute: 0.1 10*3/uL (ref 0.0–0.1)
Basophils Relative: 1 %
Blasts: 0 %
Eosinophils Absolute: 0.1 10*3/uL (ref 0.0–0.5)
Eosinophils Relative: 1 %
HCT: 35.5 % — ABNORMAL LOW (ref 39.0–52.0)
Hemoglobin: 11.7 g/dL — ABNORMAL LOW (ref 13.0–17.0)
Lymphocytes Relative: 25 %
Lymphs Abs: 1.5 10*3/uL (ref 0.7–4.0)
MCH: 36.7 pg — ABNORMAL HIGH (ref 26.0–34.0)
MCHC: 33 g/dL (ref 30.0–36.0)
MCV: 111.3 fL — ABNORMAL HIGH (ref 80.0–100.0)
Metamyelocytes Relative: 0 %
Monocytes Absolute: 0.5 10*3/uL (ref 0.1–1.0)
Monocytes Relative: 8 %
Myelocytes: 0 %
Neutro Abs: 3.8 10*3/uL (ref 1.7–7.7)
Neutrophils Relative %: 65 %
Other: 0 %
Platelets: 253 10*3/uL (ref 150–400)
Promyelocytes Relative: 0 %
RBC: 3.19 MIL/uL — ABNORMAL LOW (ref 4.22–5.81)
RDW: 14.1 % (ref 11.5–15.5)
WBC: 6 10*3/uL (ref 4.0–10.5)
nRBC: 0 % (ref 0.0–0.2)
nRBC: 0 /100 WBC

## 2022-05-28 LAB — TECHNOLOGIST SMEAR REVIEW: Plt Morphology: NORMAL

## 2022-05-28 LAB — COMPREHENSIVE METABOLIC PANEL
ALT: 22 U/L (ref 0–44)
AST: 30 U/L (ref 15–41)
Albumin: 3.7 g/dL (ref 3.5–5.0)
Alkaline Phosphatase: 50 U/L (ref 38–126)
Anion gap: 11 (ref 5–15)
BUN: 24 mg/dL — ABNORMAL HIGH (ref 8–23)
CO2: 24 mmol/L (ref 22–32)
Calcium: 8.9 mg/dL (ref 8.9–10.3)
Chloride: 102 mmol/L (ref 98–111)
Creatinine, Ser: 1.35 mg/dL — ABNORMAL HIGH (ref 0.61–1.24)
GFR, Estimated: 53 mL/min — ABNORMAL LOW (ref >60)
Glucose, Bld: 129 mg/dL — ABNORMAL HIGH (ref 70–99)
Potassium: 4.3 mmol/L (ref 3.5–5.1)
Sodium: 137 mmol/L (ref 135–145)
Total Bilirubin: 0.3 mg/dL (ref 0.3–1.2)
Total Protein: 7.7 g/dL (ref 6.5–8.1)

## 2022-05-28 LAB — HEPATITIS C ANTIBODY: HCV Ab: NONREACTIVE

## 2022-05-28 LAB — RETICULOCYTES
Immature Retic Fract: 16.9 % — ABNORMAL HIGH (ref 2.3–15.9)
RBC.: 3.12 MIL/uL — ABNORMAL LOW (ref 4.22–5.81)
Retic Count, Absolute: 85.5 10*3/uL (ref 19.0–186.0)
Retic Ct Pct: 2.7 % (ref 0.4–3.1)

## 2022-05-28 LAB — HEPATITIS B SURFACE ANTIGEN: Hepatitis B Surface Ag: NONREACTIVE

## 2022-05-28 LAB — LACTATE DEHYDROGENASE: LDH: 145 U/L (ref 98–192)

## 2022-05-28 LAB — HEPATITIS B CORE ANTIBODY, IGM: Hep B C IgM: NONREACTIVE

## 2022-05-28 LAB — HIV ANTIBODY (ROUTINE TESTING W REFLEX): HIV Screen 4th Generation wRfx: NONREACTIVE

## 2022-05-28 NOTE — Assessment & Plan Note (Addendum)
Anemia: Hemoglobin:[[name] 2023] 12 MCV-110; normal white count/platelets.  Patient is symptomatic from anemia. [name] 3582-P18 folic acid normal [PCP]  I had a long discussion with patient regarding multiple etiologies of macrocytic anemia-including nutritional; malabsorption; hemolysis; liver disease and primary bone marrow disorders-including myelodysplastic syndrome.  Patient not on any cytotoxic therapy.   #I would recommend CBC CMP LDH peripheral smear; haptoglobin; erythropoietin; F84 folic acid; reticulocyte count; myeloma panel kappa lambda light chain ratio.  Also recommend a CT scan noncontrast to rule out hepatosplenomegaly.  If no obvious cause noted would recommend further work-up including a bone marrow biopsy.  However pending above work-up hold off a bone marrow biopsy at this time.  # Unintentional weight loss-check CT scan abdomen pelvis noncontrast.  See above.  # Chronic kidney disease stage III-[Dr.Singh]  # Chronic back pain-stable.  # A.fib/CAD [Dr.Kowalski/[facility]] - on eliquis   Thank you Ms.Wynetta Emery, NP  for allowing me to participate in the care of your pleasant patient. Please do not hesitate to contact me with questions or concerns in the interim.  # DISPOSITION: # labs today # follow up in 2-3 weeks- MD: NO labs- CT AP prior-  Dr.B

## 2022-05-28 NOTE — Progress Notes (Signed)
[place] NOTE  Patient Care Team: Tomasita Morrow, NP as PCP - General (Nurse Practitioner)  CHIEF COMPLAINTS/PURPOSE OF CONSULTATION: ANEMIA   HEMATOLOGY HISTORY  # ANEMIA[Hb; MCV-platelets- WBC; Iron sat; ferritin;  GFR- CT/US; EGD/colonoscopy-  HISTORY OF PRESENTING ILLNESS: HOH; ambulating independently.  Accompanied by his wife.  SY SAINTJEAN 82 y.o.  male pleasant patient was been referred to [place] for further evaluation of macrocytic anemia.  Patient noted to have macrocytosis for the last 2 to 3 years.  Progressively getting worse.  Noted to have anemia around 12.    Patient was recently admitted to hospital for at [facility] for pneumonia.    Patient denies any night sweats.  Denies any lumps or bumps.  Colonoscopy >> years;  cologard- 2023- WNL. EDG-? None.  Blood in stools: none Blood in urine:none Difficulty swallowing: chronic- neck surgery.  Prior blood transfusion: none Liver disease:  none; Hx of hepatitis as boy.  Alcohol: quit 1972 [Hx of heavy alcohol]  Prior evaluation with hematology: none Prior bone marrow biopsy: none    Review of Systems  Constitutional:  Positive for malaise/fatigue and weight loss. Negative for chills and diaphoresis.  HENT:  Negative for nosebleeds and sore throat.   Eyes:  Negative for double vision.  Respiratory:  Negative for cough, hemoptysis, sputum production, shortness of breath and wheezing.   Cardiovascular:  Negative for chest pain, palpitations, orthopnea and leg swelling.  Gastrointestinal:  Negative for abdominal pain, blood in stool, constipation, diarrhea, heartburn, melena, nausea and vomiting.  Genitourinary:  Negative for dysuria, frequency and urgency.  Musculoskeletal:  Positive for back pain and joint pain.  Skin: Negative.  Negative for itching and rash.  Neurological:  Negative for dizziness, tingling, focal weakness, weakness and headaches.  Endo/Heme/Allergies:  Does not bruise/bleed  easily.  Psychiatric/Behavioral:  Negative for depression. The patient is not nervous/anxious and does not have insomnia.      MEDICAL HISTORY:  Past Medical History:  Diagnosis Date   Arthritis    back   Asthma    Atherosclerosis of aorta (HCC)    Atherosclerosis of aorta (HCC)    Bronchitis 05/2017   chronic   CAD (coronary artery disease)    Carotid artery disease (HCC)    Carotid stenosis    Carotid stenosis, bilateral    Chronic bronchitis (HCC)    Chronic cough    COPD (chronic obstructive pulmonary disease) (HCC)    Dysrhythmia    GERD (gastroesophageal reflux disease)    Heart murmur    HOH (hard of hearing)    hearing aids   Hypercholesteremia    Hypertension    Kidney disease    stent   Macular degeneration of left eye    Myocardial infarction ([place]) 2016   Pre-diabetes    Seasonal allergies    Seizure ([place])    Seizures ([place])    viral meningitis   Stroke [facility])    1991 and imaging 05/12/15 old strokes   TIA (transient ischemic attack) 1991   x2   Uses hearing aid    Viral meningitis 2017   Viral meningitis    Wears dentures    upper and lower    SURGICAL HISTORY: Past Surgical History:  Procedure Laterality Date   CAROTID ENDARTERECTOMY Left 2014   CAROTID ENDARTERECTOMY Right 1991   CAROTID PTA/STENT INTERVENTION Left 05/31/2020   Procedure: CAROTID PTA/STENT INTERVENTIO;  Surgeon: Algernon Huxley, MD;  Location: [facility];  Service: Cardiovascular;  Laterality: Left;   CATARACT EXTRACTION W/PHACO Left 06/24/2017   Procedure: CATARACT EXTRACTION PHACO AND INTRAOCULAR LENS PLACEMENT ([place]) LEFT BORDERLINE DIABETIC;  Surgeon: Leandrew Koyanagi, MD;  Location: [facility];  Service: Ophthalmology;  Laterality: Left;   CATARACT EXTRACTION W/PHACO Right 07/22/2017   Procedure: CATARACT EXTRACTION PHACO AND INTRAOCULAR LENS PLACEMENT ([place]) RIGHT BORDERLINE DIABETIC;  Surgeon: Leandrew Koyanagi, MD;  Location: [place] [facility];  Service:  Ophthalmology;  Laterality: Right;   CORONARY ARTERY BYPASS GRAFT     x5   SKIN LESION EXCISION Left    left ear   URETERAL STENT PLACEMENT      SOCIAL HISTORY: Social History   Socioeconomic History   Marital status: Married    Spouse name: Not on file   Number of children: Not on file   Years of education: Not on file   Highest education level: 10th grade  Occupational History   Not on file  Tobacco Use   Smoking status: Former    Packs/day: 2.00    Years: 10.00    Total pack years: 20.00    Types: Cigarettes    Quit date: 04/28/1970    Years since quitting: 52.1   Smokeless tobacco: Former  Scientific laboratory technician Use: Never used  Substance and Sexual Activity   Alcohol use: No    Alcohol/week: 0.0 standard drinks of alcohol    Comment: previous   Drug use: No   Sexual activity: Yes    Comment: wife   Other Topics Concern   Not on file  Social History Narrative   Re-married x 3 years    Retired    Owns guns, wears seat belt, safe in relationship    2 sons and 1 daughter    6 years [employer]    Social Determinants of Health   Financial Resource Strain: Low Risk  (02/14/2022)   Overall Financial Resource Strain (CARDIA)    Difficulty of Paying Living Expenses: Not hard at all  Food Insecurity: No Food Insecurity (05/28/2022)   Hunger Vital Sign    Worried About Running Out of Food in the Last Year: Never true    [place] in the Last Year: Never true  Transportation Needs: No Transportation Needs (05/28/2022)   PRAPARE - Hydrologist (Medical): No    Lack of Transportation (Non-Medical): No  Physical Activity: Insufficiently Active (02/14/2022)   Exercise Vital Sign    Days of Exercise per Week: 2 days    Minutes of Exercise per Session: 20 min  Stress: No Stress Concern Present (02/14/2022)   [place]    Feeling of Stress : Not at all  Social  Connections: Moderately Integrated (02/14/2022)   Social Connection and Isolation Panel [NHANES]    Frequency of Communication with Friends and Family: More than three times a week    Frequency of Social Gatherings with Friends and Family: More than three times a week    Attends Religious Services: More than 4 times per year    Active Member of [employer] or Organizations: No    Attends Archivist Meetings: Never    Marital Status: Married  Human resources officer Violence: Not At Risk (05/28/2022)   Humiliation, Afraid, Rape, and Kick questionnaire    Fear of Current or Ex-Partner: No    Emotionally Abused: No    Physically Abused: No    Sexually Abused: No  FAMILY HISTORY: Family History  Problem Relation Age of Onset   Stroke Mother    Heart attack Mother    Dementia Mother    Stroke Father    Stroke Sister    Alzheimer's disease Sister    Dementia Sister    Dementia Sister    Stroke Brother    Alzheimer's disease Maternal Grandmother    Dementia Maternal Grandmother    Stroke Paternal Grandmother    Stroke Paternal Grandfather    Alzheimer's disease Paternal Grandfather    Cancer Neg Hx     ALLERGIES:  is allergic to fexofenadine-pseudoephed er, nifedipine, pseudoephedrine, allegra  [fexofenadine], anoro ellipta [umeclidinium-vilanterol], carvedilol, doxycycline hyclate, and prednisone.  MEDICATIONS:  Current Outpatient Medications  Medication Sig Dispense Refill   Accu-Chek Softclix Lancets lancets Test blood glucose once daily 100 each 12   acetaminophen (TYLENOL) 500 MG tablet Take 500 mg by mouth every 6 (six) hours as needed.     albuterol (PROAIR HFA) 108 (90 Base) MCG/ACT inhaler INHALE 1-2 PUFFS INTO THE LUNGS EVERY 4 (FOUR) HOURS AS NEEDED FOR WHEEZING OR SHORTNESS OF BREATH. 18 each 12   apixaban (ELIQUIS) 5 MG TABS tablet TAKE 1 TABLET BY MOUTH TWICE DAILY 60 tablet 5   aspirin EC 81 MG tablet Take 81 mg by mouth daily.     atorvastatin (LIPITOR) 80 MG  tablet Take 1 tablet (80 mg total) by mouth daily. 90 tablet 3   blood glucose meter kit and supplies Dispense based on patient and insurance preference. Use as directed to check blood glucose once daily. 1 each 0   Cholecalciferol (VITAMIN D3) 2000 UNITS capsule Take 2,000 Units by mouth daily. am     cyclobenzaprine (FLEXERIL) 5 MG tablet 5 mg 3 (three) times daily as needed.     docusate sodium (COLACE) 50 MG capsule Take 50 mg by mouth daily as needed.     fexofenadine (ALLEGRA) 180 MG tablet Take 1 tablet (180 mg total) by mouth daily as needed for allergies or rhinitis. 90 tablet 3   fluticasone (FLOVENT HFA) 220 MCG/ACT inhaler Inhale 2 puffs into the lungs 2 (two) times daily. Rinse your mouth 12 each 11   folic acid (FOLVITE) 1 MG tablet Take 0.8 mg by mouth daily. am     glucose blood test strip Test blood glucose once daily 100 each 12   hydrALAZINE (APRESOLINE) 50 MG tablet Take 1 tablet (50 mg total) by mouth 3 (three) times daily. 270 tablet 3   lidocaine (LIDODERM) 5 % APPLY 1 PATCH BY TOPICAL ROUTE ONCE DAILY (MAY WEAR UP TO 12HOURS.)     metoprolol tartrate (LOPRESSOR) 50 MG tablet Take 1 tablet (50 mg total) by mouth 2 (two) times daily. D/c coreg pt prefers metoprolol 180 tablet 3   Multiple Vitamins-Minerals (CENTRUM SILVER 50+MEN PO) Take by mouth.     pantoprazole (PROTONIX) 40 MG tablet Take 1 tablet (40 mg total) by mouth 2 (two) times daily before a meal. TRY TO TAKE ONLY 1X PER DAY INSTEAD OF 2 30 MIN BEFORE A MEAL 180 tablet 3   tamsulosin (FLOMAX) 0.4 MG CAPS capsule Take 1 capsule by mouth daily.     tiZANidine HCl (ZANAFLEX PO) Take 1 tablet by mouth at bedtime as needed (muscle spasms).     triamcinolone cream (KENALOG) 0.1 % Apply 1 Application topically 2 (two) times daily. 30 g 0   Current Facility-Administered Medications  Medication Dose Route Frequency Provider Last Rate Last Admin  ipratropium-albuterol (DUONEB) 0.5-2.5 (3) MG/3ML nebulizer solution 3 mL  3  mL Nebulization Once Karamalegos, Alexander J, DO         .  PHYSICAL EXAMINATION:   Vitals:   05/28/22 1100  BP: (!) 119/56  Pulse: 64  Resp: 18  Temp: 97.6 F (36.4 C)   Filed Weights   05/28/22 1100  Weight: 207 lb (93.9 kg)    Physical Exam Vitals and nursing note reviewed.  HENT:     Head: Normocephalic and atraumatic.     Mouth/Throat:     Pharynx: Oropharynx is clear.  Eyes:     Extraocular Movements: Extraocular movements intact.     Pupils: Pupils are equal, round, and reactive to light.  Cardiovascular:     Rate and Rhythm: Normal rate and regular rhythm.  Pulmonary:     Comments: Decreased breath sounds bilaterally.  Abdominal:     Palpations: Abdomen is soft.  Musculoskeletal:        General: Normal range of motion.     Cervical back: Normal range of motion.  Skin:    General: Skin is warm.  Neurological:     General: No focal deficit present.     Mental Status: He is alert and oriented to person, place, and time.  Psychiatric:        Behavior: Behavior normal.        Judgment: Judgment normal.      LABORATORY DATA:  I have reviewed the data as listed Lab Results  Component Value Date   WBC 6.0 05/28/2022   HGB 11.7 (L) 05/28/2022   HCT 35.5 (L) 05/28/2022   MCV 111.3 (H) 05/28/2022   PLT 253 05/28/2022   Recent Labs    05/13/22 1324 05/28/22 1221  NA 140 137  K 4.6 4.3  CL 104 102  CO2 28 24  GLUCOSE 142* 129*  BUN 26* 24*  CREATININE 1.61* 1.35*  CALCIUM 9.2 8.9  GFRNONAA  --  53*  PROT 7.1 7.7  ALBUMIN 4.2 3.7  AST 25 30  ALT 19 22  ALKPHOS 45 50  BILITOT 0.4 0.3     No results found.  ASSESSMENT & PLAN:   Symptomatic anemia Anemia: Hemoglobin:[[name] 1937] 90 MCV-110; normal white count/platelets.  Patient is symptomatic from anemia. [name] 2409-B35 folic acid normal [PCP]  I had a long discussion with patient regarding multiple etiologies of macrocytic anemia-including nutritional; malabsorption; hemolysis; liver  disease and primary bone marrow disorders-including myelodysplastic syndrome.  Patient not on any cytotoxic therapy.   #I would recommend CBC CMP LDH peripheral smear; haptoglobin; erythropoietin; H29 folic acid; reticulocyte count; myeloma panel kappa lambda light chain ratio.  Also recommend a CT scan noncontrast to rule out hepatosplenomegaly.  If no obvious cause noted would recommend further work-up including a bone marrow biopsy.  However pending above work-up hold off a bone marrow biopsy at this time.  # Unintentional weight loss-check CT scan abdomen pelvis noncontrast.  See above.  # Chronic kidney disease stage III-[Dr.Singh]  # Chronic back pain-stable.  # A.fib/CAD [Dr.Kowalski/[facility]] - on eliquis   Thank you Ms.Wynetta Emery, NP  for allowing me to participate in the care of your pleasant patient. Please do not hesitate to contact me with questions or concerns in the interim.  # DISPOSITION: # labs today # follow up in 2-3 weeks- MD: NO labs- CT AP prior-  Dr.B    All questions were answered. The patient knows to call the clinic with any problems, questions  or concerns.    Cammie Sickle, MD 05/28/2022 12:55 PM

## 2022-05-28 NOTE — Progress Notes (Signed)
New patient evaluation with no acute problems /concerns  Patient does exhibit  several days of feeling down and depression, not interested in referral to csw.

## 2022-05-29 LAB — KAPPA/LAMBDA LIGHT CHAINS
Kappa free light chain: 52.7 mg/L — ABNORMAL HIGH (ref 3.3–19.4)
Kappa, lambda light chain ratio: 2.72 — ABNORMAL HIGH (ref 0.26–1.65)
Lambda free light chains: 19.4 mg/L (ref 5.7–26.3)

## 2022-05-30 LAB — HAPTOGLOBIN: Haptoglobin: 267 mg/dL (ref 38–329)

## 2022-05-30 LAB — ERYTHROPOIETIN: Erythropoietin: 44.3 m[IU]/mL — ABNORMAL HIGH (ref 2.6–18.5)

## 2022-06-02 LAB — MULTIPLE MYELOMA PANEL, SERUM
Albumin SerPl Elph-Mcnc: 3.6 g/dL (ref 2.9–4.4)
Albumin/Glob SerPl: 1.1 (ref 0.7–1.7)
Alpha 1: 0.3 g/dL (ref 0.0–0.4)
Alpha2 Glob SerPl Elph-Mcnc: 0.9 g/dL (ref 0.4–1.0)
B-Globulin SerPl Elph-Mcnc: 1.6 g/dL — ABNORMAL HIGH (ref 0.7–1.3)
Gamma Glob SerPl Elph-Mcnc: 0.9 g/dL (ref 0.4–1.8)
Globulin, Total: 3.6 g/dL (ref 2.2–3.9)
IgA: 821 mg/dL — ABNORMAL HIGH (ref 61–437)
IgG (Immunoglobin G), Serum: 1066 mg/dL (ref 603–1613)
IgM (Immunoglobulin M), Srm: 58 mg/dL (ref 15–143)
Total Protein ELP: 7.2 g/dL (ref 6.0–8.5)

## 2022-06-03 DIAGNOSIS — M545 Low back pain, unspecified: Secondary | ICD-10-CM | POA: Diagnosis not present

## 2022-06-04 ENCOUNTER — Ambulatory Visit
Admission: RE | Admit: 2022-06-04 | Discharge: 2022-06-04 | Disposition: A | Discharge status: Home / Self Care | Payer: Medicare Other | Source: Ambulatory Visit | Attending: Internal Medicine | Admitting: Internal Medicine

## 2022-06-04 DIAGNOSIS — Z9889 Other specified postprocedural states: Secondary | ICD-10-CM | POA: Diagnosis not present

## 2022-06-04 DIAGNOSIS — Z8679 Personal history of other diseases of the circulatory system: Secondary | ICD-10-CM | POA: Diagnosis not present

## 2022-06-04 DIAGNOSIS — Z951 Presence of aortocoronary bypass graft: Secondary | ICD-10-CM | POA: Diagnosis not present

## 2022-06-04 DIAGNOSIS — R634 Abnormal weight loss: Secondary | ICD-10-CM | POA: Diagnosis not present

## 2022-06-04 DIAGNOSIS — N1831 Chronic kidney disease, stage 3a: Secondary | ICD-10-CM | POA: Diagnosis not present

## 2022-06-04 DIAGNOSIS — D649 Anemia, unspecified: Secondary | ICD-10-CM | POA: Diagnosis not present

## 2022-06-04 DIAGNOSIS — I6523 Occlusion and stenosis of bilateral carotid arteries: Secondary | ICD-10-CM | POA: Diagnosis not present

## 2022-06-04 DIAGNOSIS — J439 Emphysema, unspecified: Secondary | ICD-10-CM | POA: Diagnosis not present

## 2022-06-04 DIAGNOSIS — E785 Hyperlipidemia, unspecified: Secondary | ICD-10-CM | POA: Diagnosis not present

## 2022-06-04 DIAGNOSIS — I10 Essential (primary) hypertension: Secondary | ICD-10-CM | POA: Diagnosis not present

## 2022-06-04 DIAGNOSIS — K4090 Unilateral inguinal hernia, without obstruction or gangrene, not specified as recurrent: Secondary | ICD-10-CM | POA: Diagnosis not present

## 2022-06-04 DIAGNOSIS — I700 Atherosclerosis of aorta: Secondary | ICD-10-CM | POA: Diagnosis not present

## 2022-06-04 DIAGNOSIS — I2510 Atherosclerotic heart disease of native coronary artery without angina pectoris: Secondary | ICD-10-CM | POA: Diagnosis not present

## 2022-06-04 DIAGNOSIS — I480 Paroxysmal atrial fibrillation: Secondary | ICD-10-CM | POA: Diagnosis not present

## 2022-06-10 DIAGNOSIS — M545 Low back pain, unspecified: Secondary | ICD-10-CM | POA: Diagnosis not present

## 2022-06-12 ENCOUNTER — Inpatient Hospital Stay: Payer: Medicare Other | Attending: Internal Medicine | Admitting: Internal Medicine

## 2022-06-12 ENCOUNTER — Encounter: Payer: Self-pay | Admitting: Internal Medicine

## 2022-06-12 ENCOUNTER — Telehealth: Payer: Self-pay

## 2022-06-12 VITALS — BP 146/60 | HR 56 | Temp 97.2°F | Resp 17 | Wt 207.6 lb

## 2022-06-12 DIAGNOSIS — I252 Old myocardial infarction: Secondary | ICD-10-CM | POA: Diagnosis not present

## 2022-06-12 DIAGNOSIS — K449 Diaphragmatic hernia without obstruction or gangrene: Secondary | ICD-10-CM | POA: Insufficient documentation

## 2022-06-12 DIAGNOSIS — I4891 Unspecified atrial fibrillation: Secondary | ICD-10-CM | POA: Insufficient documentation

## 2022-06-12 DIAGNOSIS — J4489 Other specified chronic obstructive pulmonary disease: Secondary | ICD-10-CM | POA: Insufficient documentation

## 2022-06-12 DIAGNOSIS — J449 Chronic obstructive pulmonary disease, unspecified: Secondary | ICD-10-CM | POA: Diagnosis not present

## 2022-06-12 DIAGNOSIS — R011 Cardiac murmur, unspecified: Secondary | ICD-10-CM | POA: Insufficient documentation

## 2022-06-12 DIAGNOSIS — E1122 Type 2 diabetes mellitus with diabetic chronic kidney disease: Secondary | ICD-10-CM | POA: Insufficient documentation

## 2022-06-12 DIAGNOSIS — I700 Atherosclerosis of aorta: Secondary | ICD-10-CM | POA: Diagnosis not present

## 2022-06-12 DIAGNOSIS — D472 Monoclonal gammopathy: Secondary | ICD-10-CM | POA: Insufficient documentation

## 2022-06-12 DIAGNOSIS — D539 Nutritional anemia, unspecified: Secondary | ICD-10-CM | POA: Insufficient documentation

## 2022-06-12 DIAGNOSIS — I10 Essential (primary) hypertension: Secondary | ICD-10-CM | POA: Insufficient documentation

## 2022-06-12 DIAGNOSIS — Z7982 Long term (current) use of aspirin: Secondary | ICD-10-CM | POA: Diagnosis not present

## 2022-06-12 DIAGNOSIS — R634 Abnormal weight loss: Secondary | ICD-10-CM | POA: Insufficient documentation

## 2022-06-12 DIAGNOSIS — Z87891 Personal history of nicotine dependence: Secondary | ICD-10-CM | POA: Diagnosis not present

## 2022-06-12 DIAGNOSIS — E7800 Pure hypercholesterolemia, unspecified: Secondary | ICD-10-CM | POA: Insufficient documentation

## 2022-06-12 DIAGNOSIS — Z79899 Other long term (current) drug therapy: Secondary | ICD-10-CM | POA: Insufficient documentation

## 2022-06-12 DIAGNOSIS — M129 Arthropathy, unspecified: Secondary | ICD-10-CM | POA: Insufficient documentation

## 2022-06-12 DIAGNOSIS — I129 Hypertensive chronic kidney disease with stage 1 through stage 4 chronic kidney disease, or unspecified chronic kidney disease: Secondary | ICD-10-CM | POA: Diagnosis not present

## 2022-06-12 DIAGNOSIS — I2510 Atherosclerotic heart disease of native coronary artery without angina pectoris: Secondary | ICD-10-CM | POA: Insufficient documentation

## 2022-06-12 DIAGNOSIS — D649 Anemia, unspecified: Secondary | ICD-10-CM | POA: Diagnosis not present

## 2022-06-12 DIAGNOSIS — G8929 Other chronic pain: Secondary | ICD-10-CM | POA: Diagnosis not present

## 2022-06-12 DIAGNOSIS — K219 Gastro-esophageal reflux disease without esophagitis: Secondary | ICD-10-CM | POA: Diagnosis not present

## 2022-06-12 DIAGNOSIS — Z7951 Long term (current) use of inhaled steroids: Secondary | ICD-10-CM | POA: Insufficient documentation

## 2022-06-12 DIAGNOSIS — Z8673 Personal history of transient ischemic attack (TIA), and cerebral infarction without residual deficits: Secondary | ICD-10-CM | POA: Insufficient documentation

## 2022-06-12 DIAGNOSIS — N183 Chronic kidney disease, stage 3 unspecified: Secondary | ICD-10-CM | POA: Diagnosis not present

## 2022-06-12 DIAGNOSIS — Z7901 Long term (current) use of anticoagulants: Secondary | ICD-10-CM | POA: Insufficient documentation

## 2022-06-12 NOTE — Progress Notes (Signed)
[place] NOTE  Patient Care Team: Tomasita Morrow, NP as PCP - General (Nurse Practitioner)  CHIEF COMPLAINTS/PURPOSE OF CONSULTATION: ANEMIA   HEMATOLOGY HISTORY  # ANEMIA[Hb; MCV-platelets- WBC; Iron sat; ferritin;  GFR- CT/US; EGD/colonoscopy-  HISTORY OF PRESENTING ILLNESS: HOH; ambulating independently.  Accompanied by his wife.  Keith Cruz 82 y.o.  male pleasant patient is for follow-up to review the results of his blood work ordered for macrocytic anemia; and also CT scan.  Having MRI next week [facility]. Pain in back. Weak. Does have dizziness and lightheadedness. No visible blood in stool or urine. Appetite is lower, eats 1 good meal a day.   Patient noted to have macrocytosis for the last 2 to 3 years.  Progressively getting worse.  Noted to have anemia around 12.    Patient denies any night sweats.  Denies any lumps or bumps.     Review of Systems  Constitutional:  Positive for malaise/fatigue and weight loss. Negative for chills and diaphoresis.  HENT:  Negative for nosebleeds and sore throat.   Eyes:  Negative for double vision.  Respiratory:  Negative for cough, hemoptysis, sputum production, shortness of breath and wheezing.   Cardiovascular:  Negative for chest pain, palpitations, orthopnea and leg swelling.  Gastrointestinal:  Negative for abdominal pain, blood in stool, constipation, diarrhea, heartburn, melena, nausea and vomiting.  Genitourinary:  Negative for dysuria, frequency and urgency.  Musculoskeletal:  Positive for back pain and joint pain.  Skin: Negative.  Negative for itching and rash.  Neurological:  Negative for dizziness, tingling, focal weakness, weakness and headaches.  Endo/Heme/Allergies:  Does not bruise/bleed easily.  Psychiatric/Behavioral:  Negative for depression. The patient is not nervous/anxious and does not have insomnia.      MEDICAL HISTORY:  Past Medical History:  Diagnosis Date   Arthritis     back   Asthma    Atherosclerosis of aorta (HCC)    Atherosclerosis of aorta (HCC)    Bronchitis 05/2017   chronic   CAD (coronary artery disease)    Carotid artery disease (HCC)    Carotid stenosis    Carotid stenosis, bilateral    Chronic bronchitis (HCC)    Chronic cough    COPD (chronic obstructive pulmonary disease) (HCC)    Dysrhythmia    GERD (gastroesophageal reflux disease)    Heart murmur    HOH (hard of hearing)    hearing aids   Hypercholesteremia    Hypertension    Kidney disease    stent   Macular degeneration of left eye    Myocardial infarction ([place]) 2016   Pre-diabetes    Seasonal allergies    Seizure ([place])    Seizures ([place])    viral meningitis   Stroke [facility])    1991 and imaging 05/12/15 old strokes   TIA (transient ischemic attack) 1991   x2   Uses hearing aid    Viral meningitis 2017   Viral meningitis    Wears dentures    upper and lower    SURGICAL HISTORY: Past Surgical History:  Procedure Laterality Date   CAROTID ENDARTERECTOMY Left 2014   CAROTID ENDARTERECTOMY Right 1991   CAROTID PTA/STENT INTERVENTION Left 05/31/2020   Procedure: CAROTID PTA/STENT INTERVENTIO;  Surgeon: Algernon Huxley, MD;  Location: [place] [facility];  Service: Cardiovascular;  Laterality: Left;   CATARACT EXTRACTION W/PHACO Left 06/24/2017   Procedure: CATARACT EXTRACTION PHACO AND INTRAOCULAR LENS PLACEMENT ([place]) LEFT BORDERLINE DIABETIC;  Surgeon: Leandrew Koyanagi,  MD;  Location: [place];  Service: Ophthalmology;  Laterality: Left;   CATARACT EXTRACTION W/PHACO Right 07/22/2017   Procedure: CATARACT EXTRACTION PHACO AND INTRAOCULAR LENS PLACEMENT ([place]) RIGHT BORDERLINE DIABETIC;  Surgeon: Leandrew Koyanagi, MD;  Location: [place];  Service: Ophthalmology;  Laterality: Right;   CORONARY ARTERY BYPASS GRAFT     x5   SKIN LESION EXCISION Left    left ear   URETERAL STENT PLACEMENT      SOCIAL HISTORY: Social History   Socioeconomic  History   Marital status: Married    Spouse name: Not on file   Number of children: Not on file   Years of education: Not on file   Highest education level: 10th grade  Occupational History   Not on file  Tobacco Use   Smoking status: Former    Packs/day: 2.00    Years: 10.00    Total pack years: 20.00    Types: Cigarettes    Quit date: 04/28/1970    Years since quitting: 52.1   Smokeless tobacco: Former  Scientific laboratory technician Use: Never used  Substance and Sexual Activity   Alcohol use: No    Alcohol/week: 0.0 standard drinks of alcohol    Comment: previous   Drug use: No   Sexual activity: Yes    Comment: wife   Other Topics Concern   Not on file  Social History Narrative   Re-married x 3 years    Retired    Owns guns, wears seat belt, safe in relationship    2 sons and 1 daughter    6 years [employer]    Social Determinants of Health   Financial Resource Strain: Low Risk  (02/14/2022)   Overall Financial Resource Strain (CARDIA)    Difficulty of Paying Living Expenses: Not hard at all  Food Insecurity: No Food Insecurity (05/28/2022)   Hunger Vital Sign    Worried About Running Out of Food in the Last Year: Never true    [place] in the Last Year: Never true  Transportation Needs: No Transportation Needs (05/28/2022)   PRAPARE - Hydrologist (Medical): No    Lack of Transportation (Non-Medical): No  Physical Activity: Insufficiently Active (02/14/2022)   Exercise Vital Sign    Days of Exercise per Week: 2 days    Minutes of Exercise per Session: 20 min  Stress: No Stress Concern Present (02/14/2022)   [place]    Feeling of Stress : Not at all  Social Connections: Moderately Integrated (02/14/2022)   Social Connection and Isolation Panel [NHANES]    Frequency of Communication with Friends and Family: More than three times a week    Frequency of Social  Gatherings with Friends and Family: More than three times a week    Attends Religious Services: More than 4 times per year    Active Member of [employer] or Organizations: No    Attends Archivist Meetings: Never    Marital Status: Married  Human resources officer Violence: Not At Risk (05/28/2022)   Humiliation, Afraid, Rape, and Kick questionnaire    Fear of Current or Ex-Partner: No    Emotionally Abused: No    Physically Abused: No    Sexually Abused: No    FAMILY HISTORY: Family History  Problem Relation Age of Onset   Stroke Mother    Heart attack Mother    Dementia Mother  Stroke Father    Stroke Sister    Alzheimer's disease Sister    Dementia Sister    Dementia Sister    Stroke Brother    Alzheimer's disease Maternal Grandmother    Dementia Maternal Grandmother    Stroke Paternal Grandmother    Stroke Paternal Grandfather    Alzheimer's disease Paternal Grandfather    Cancer Neg Hx     ALLERGIES:  is allergic to fexofenadine-pseudoephed er, nifedipine, pseudoephedrine, allegra  [fexofenadine], anoro ellipta [umeclidinium-vilanterol], carvedilol, doxycycline hyclate, and prednisone.  MEDICATIONS:  Current Outpatient Medications  Medication Sig Dispense Refill   Accu-Chek Softclix Lancets lancets Test blood glucose once daily 100 each 12   acetaminophen (TYLENOL) 500 MG tablet Take 500 mg by mouth every 6 (six) hours as needed.     albuterol (PROAIR HFA) 108 (90 Base) MCG/ACT inhaler INHALE 1-2 PUFFS INTO THE LUNGS EVERY 4 (FOUR) HOURS AS NEEDED FOR WHEEZING OR SHORTNESS OF BREATH. 18 each 12   apixaban (ELIQUIS) 5 MG TABS tablet TAKE 1 TABLET BY MOUTH TWICE DAILY 60 tablet 5   aspirin EC 81 MG tablet Take 81 mg by mouth daily.     atorvastatin (LIPITOR) 80 MG tablet Take 1 tablet (80 mg total) by mouth daily. 90 tablet 3   blood glucose meter kit and supplies Dispense based on patient and insurance preference. Use as directed to check blood glucose once daily. 1  each 0   Cholecalciferol (VITAMIN D3) 2000 UNITS capsule Take 2,000 Units by mouth daily. am     cyclobenzaprine (FLEXERIL) 5 MG tablet 5 mg 3 (three) times daily as needed.     docusate sodium (COLACE) 50 MG capsule Take 50 mg by mouth daily as needed.     fexofenadine (ALLEGRA) 180 MG tablet Take 1 tablet (180 mg total) by mouth daily as needed for allergies or rhinitis. 90 tablet 3   fluticasone (FLOVENT HFA) 220 MCG/ACT inhaler Inhale 2 puffs into the lungs 2 (two) times daily. Rinse your mouth 12 each 11   folic acid (FOLVITE) 1 MG tablet Take 0.8 mg by mouth daily. am     glucose blood test strip Test blood glucose once daily 100 each 12   hydrALAZINE (APRESOLINE) 50 MG tablet Take 1 tablet (50 mg total) by mouth 3 (three) times daily. 270 tablet 3   metoprolol tartrate (LOPRESSOR) 50 MG tablet Take 1 tablet (50 mg total) by mouth 2 (two) times daily. D/c coreg pt prefers metoprolol 180 tablet 3   Multiple Vitamins-Minerals (CENTRUM SILVER 50+MEN PO) Take by mouth.     pantoprazole (PROTONIX) 40 MG tablet Take 1 tablet (40 mg total) by mouth 2 (two) times daily before a meal. TRY TO TAKE ONLY 1X PER DAY INSTEAD OF 2 30 MIN BEFORE A MEAL 180 tablet 3   tamsulosin (FLOMAX) 0.4 MG CAPS capsule Take 1 capsule by mouth daily.     tiZANidine HCl (ZANAFLEX PO) Take 1 tablet by mouth at bedtime as needed (muscle spasms).     triamcinolone cream (KENALOG) 0.1 % Apply 1 Application topically 2 (two) times daily. 30 g 0   lidocaine (LIDODERM) 5 % APPLY 1 PATCH BY TOPICAL ROUTE ONCE DAILY (MAY WEAR UP TO 12HOURS.) (Patient not taking: Reported on 06/12/2022)     Current Facility-Administered Medications  Medication Dose Route Frequency Provider Last Rate Last Admin   ipratropium-albuterol (DUONEB) 0.5-2.5 (3) MG/3ML nebulizer solution 3 mL  3 mL Nebulization Once Olin Hauser, DO         .  PHYSICAL EXAMINATION:   Vitals:   06/12/22 1053  BP: (!) 146/60  Pulse: (!) 56  Resp: 17   Temp: (!) 97.2 F (36.2 C)  SpO2: 98%   Filed Weights   06/12/22 1053  Weight: 207 lb 9.6 oz (94.2 kg)    Physical Exam Vitals and nursing note reviewed.  HENT:     Head: Normocephalic and atraumatic.     Mouth/Throat:     Pharynx: Oropharynx is clear.  Eyes:     Extraocular Movements: Extraocular movements intact.     Pupils: Pupils are equal, round, and reactive to light.  Cardiovascular:     Rate and Rhythm: Normal rate and regular rhythm.  Pulmonary:     Comments: Decreased breath sounds bilaterally.  Abdominal:     Palpations: Abdomen is soft.  Musculoskeletal:        General: Normal range of motion.     Cervical back: Normal range of motion.  Skin:    General: Skin is warm.  Neurological:     General: No focal deficit present.     Mental Status: He is alert and oriented to person, place, and time.  Psychiatric:        Behavior: Behavior normal.        Judgment: Judgment normal.      LABORATORY DATA:  I have reviewed the data as listed Lab Results  Component Value Date   WBC 6.0 05/28/2022   HGB 11.7 (L) 05/28/2022   HCT 35.5 (L) 05/28/2022   MCV 111.3 (H) 05/28/2022   PLT 253 05/28/2022   Recent Labs    05/13/22 1324 05/28/22 1221  NA 140 137  K 4.6 4.3  CL 104 102  CO2 28 24  GLUCOSE 142* 129*  BUN 26* 24*  CREATININE 1.61* 1.35*  CALCIUM 9.2 8.9  GFRNONAA  --  53*  PROT 7.1 7.7  ALBUMIN 4.2 3.7  AST 25 30  ALT 19 22  ALKPHOS 45 50  BILITOT 0.4 0.3     CT ABDOMEN PELVIS WO CONTRAST  Result Date: 06/04/2022 CLINICAL DATA:  Unintentional 20 lb weight loss over past year. EXAM: CT ABDOMEN AND PELVIS WITHOUT CONTRAST TECHNIQUE: Multidetector CT imaging of the abdomen and pelvis was performed following the standard protocol without IV contrast. RADIATION DOSE REDUCTION: This exam was performed according to the departmental dose-optimization program which includes automated exposure control, adjustment of the mA and/or kV according to patient  size and/or use of iterative reconstruction technique. COMPARISON:  07/17/2005 FINDINGS: Lower chest: Chronic bibasilar interstitial lung disease is new since 2007. Hepatobiliary: No mass visualized on this unenhanced exam. Gallbladder is unremarkable. No evidence of biliary ductal dilatation. Pancreas: No mass or inflammatory process visualized on this unenhanced exam. Spleen:  Within normal limits in size. Adrenals/Urinary tract: No evidence of urolithiasis or hydronephrosis. Unremarkable unopacified urinary bladder. Stomach/Bowel: New small hiatal hernia. No evidence of obstruction, inflammatory process, or abnormal fluid collections. Normal appendix visualized. Vascular/Lymphatic: No pathologically enlarged lymph nodes identified. No evidence of abdominal aortic aneurysm. Aortic atherosclerotic calcification incidentally noted. Reproductive:  No mass or other significant abnormality. Other: Stable small bilateral inguinal hernias, which contain only fat. Musculoskeletal:  No suspicious bone lesions identified. IMPRESSION: No evidence of abdominal or pelvic neoplasm. New small hiatal hernia. Stable small bilateral inguinal hernias, which contain only fat. Chronic bibasilar interstitial lung disease, new since 2007. Aortic Atherosclerosis (ICD10-I70.0). Electronically Signed   By: Marlaine Hind M.D.   On: 06/04/2022 13:24    ASSESSMENT &  PLAN:   Symptomatic anemia # Anemia: Hemoglobin:[[name] [id number] MCV-110; normal white count/platelets.  Patient is symptomatic from anemia. [name] [id number] folic acid normal [PCP]; FEB 7th, CT scan AP-negative for hepatosplenomegaly or cirrhosis. No evidence of abdominal or pelvic neoplasm.  # Extensive workup-including HIV hepatitis negative.  No evidence of hemolysis [normal haptoglobin/LDH].  I discussed the possibility of MDS high in the differential.  Clinically this is not suggestive of myeloma.  See below discussed the treatment options of MDS include growth factor  injections, tablets versus chemotherapy.  Await bone marrow biopsy prior to make any recommendations.  # Discussed with the patient the bone marrow biopsy and aspiration indication and procedure at length.  Given significant discomfort involved-I would recommend under anesthesia/with radiology in the hospital. I discussed the potential complications include-bleeding/trauma and risk of infection; which are fortunately very rare.  Patient is in agreement. Patient will sign the consent prior to the procedure. Bone marrow biopsy/aspiration is ordered.   # ?  MGUS: immunofixation shows IgA monoclonal protein with kappa light chain specificity. Monoclonal bands detected are faint.  Kappa lambda light chain ratio= 2.72.  Unlikely myeloma.  Await bone marrow biopsy ordered for possible MDS.   # Unintentional weight loss-CT scan abdomen pelvis non-contrast- negative.  Consider evaluation with nutrition; declines.   # COPD - Dr.Fleming-   # Chronic kidney disease stage III-[Dr.Singh]- stable.  Likely unrelated to-possible MGUS as noted above.  # Chronic back pain-awaiting MRI with orthopedics.  # A.fib/CAD [Dr.Kowalski/[facility]-Cards] - on eliquis   #Incidental findings on Imaging  CT FEB, 2024: Atherosclerosis; chronic interstitial lung disease I reviewed/discussed/counseled the patient.   # DISPOSITION: # bone marrow Biopsy- # follow up in 2 weeks after the Bone marrow Biopsy- MD; no labs- Dr.B    All questions were answered. The patient knows to call the clinic with any problems, questions or concerns.    Cammie Sickle, MD 06/12/2022 12:28 PM

## 2022-06-12 NOTE — Telephone Encounter (Signed)
Scheduling request for ASAP bone marrow bx request faxed to specialty scheduling.

## 2022-06-12 NOTE — Assessment & Plan Note (Addendum)
#   Anemia: Hemoglobin:[[name] 2023] 12 MCV-110; normal white count/platelets.  Patient is symptomatic from anemia. [name] [id number] folic acid normal [PCP]; FEB 7th, CT scan AP-negative for hepatosplenomegaly or cirrhosis. No evidence of abdominal or pelvic neoplasm.  # Extensive workup-including HIV hepatitis negative.  No evidence of hemolysis [normal haptoglobin/LDH].  I discussed the possibility of MDS high in the differential.  Clinically this is not suggestive of myeloma.  See below discussed the treatment options of MDS include growth factor injections, tablets versus chemotherapy.  Await bone marrow biopsy prior to make any recommendations.  # Discussed with the patient the bone marrow biopsy and aspiration indication and procedure at length.  Given significant discomfort involved-I would recommend under anesthesia/with radiology in the hospital. I discussed the potential complications include-bleeding/trauma and risk of infection; which are fortunately very rare.  Patient is in agreement. Patient will sign the consent prior to the procedure. Bone marrow biopsy/aspiration is ordered.   # ?  MGUS: immunofixation shows IgA monoclonal protein with kappa light chain specificity. Monoclonal bands detected are faint.  Kappa lambda light chain ratio= 2.72.  Unlikely myeloma.  Await bone marrow biopsy ordered for possible MDS.   # Unintentional weight loss-CT scan abdomen pelvis non-contrast- negative.  Consider evaluation with nutrition; declines.   # COPD - Dr.Fleming-   # Chronic kidney disease stage III-[Dr.Singh]- stable.  Likely unrelated to-possible MGUS as noted above.  # Chronic back pain-awaiting MRI with orthopedics.  # A.fib/CAD [Dr.Kowalski/[facility]-Cards] - on eliquis   #Incidental findings on Imaging  CT FEB, 2024: Atherosclerosis; chronic interstitial lung disease I reviewed/discussed/counseled the patient.   # DISPOSITION: # bone marrow Biopsy- # follow up in 2 weeks after the Bone marrow  Biopsy- MD; no labs- Dr.B

## 2022-06-12 NOTE — Progress Notes (Signed)
Having MRI next week [facility]. Pain in back. Weak.  Does have dizziness and lightheadedness. No visible blood in stool or urine.Appetite is lower, eats 1 good meal a day.

## 2022-06-13 ENCOUNTER — Telehealth: Payer: Self-pay | Admitting: Internal Medicine

## 2022-06-13 NOTE — Telephone Encounter (Signed)
Vm left with patient's spouse with appointment information for biopsy follow-up.

## 2022-06-13 NOTE — Telephone Encounter (Signed)
First available for bone marrow bx is Wed 2/28 at 8:30 am with arrival of 7:30 am (first available at [facility], [place], and [facility] locations).  Patient wife informed and agrees with appt date.  Please schedule follow up appointment with Dr. B 2 weeks after BMB and inform pt/wife of appt details.

## 2022-06-17 DIAGNOSIS — M545 Low back pain, unspecified: Secondary | ICD-10-CM | POA: Diagnosis not present

## 2022-06-20 NOTE — H&P (Addendum)
Chief Complaint: Patient was seen in consultation today for microcytic anemia at the request of Brahmanday,Govinda R  Referring Physician(s): Cammie Sickle  Supervising Physician: Michaelle Birks  Patient Status: [facility] - Out-pt  History of Present Illness: Keith Cruz is a 82 y.o. male referred to oncology for management of microcytic anemia.  He has known history of macrocytosis over the past 2 to 3 years that has progressively been getting worse.  Patient is symptomatic complaining of malaise, weight loss, back pain and joint pain. Oncology workup shows IgA monoclonal protein with kappa light chains specificity.  Patient has been referred to IR for bone marrow biopsy and aspiration to rule out MDS.  Past Medical History:  Diagnosis Date   Arthritis    back   Asthma    Atherosclerosis of aorta (HCC)    Atherosclerosis of aorta (HCC)    Bronchitis 05/2017   chronic   CAD (coronary artery disease)    Carotid artery disease (HCC)    Carotid stenosis    Carotid stenosis, bilateral    Chronic bronchitis (HCC)    Chronic cough    COPD (chronic obstructive pulmonary disease) (HCC)    Dysrhythmia    GERD (gastroesophageal reflux disease)    Heart murmur    HOH (hard of hearing)    hearing aids   Hypercholesteremia    Hypertension    Kidney disease    stent   Macular degeneration of left eye    Myocardial infarction ([place]) 2016   Pre-diabetes    Seasonal allergies    Seizure ([place])    Seizures ([place])    viral meningitis   Stroke  [facility])    1991 and imaging 05/12/15 old strokes   TIA (transient ischemic attack) 1991   x2   Uses hearing aid    Viral meningitis 2017   Viral meningitis    Wears dentures    upper and lower    Past Surgical History:  Procedure Laterality Date   CAROTID ENDARTERECTOMY Left 2014   CAROTID ENDARTERECTOMY Right 1991   CAROTID PTA/STENT INTERVENTION Left 05/31/2020   Procedure: CAROTID PTA/STENT INTERVENTIO;  Surgeon: Algernon Huxley, MD;   Location: [place] [facility];  Service: Cardiovascular;  Laterality: Left;   CATARACT EXTRACTION W/PHACO Left 06/24/2017   Procedure: CATARACT EXTRACTION PHACO AND INTRAOCULAR LENS PLACEMENT ([place]) LEFT BORDERLINE DIABETIC;  Surgeon: Leandrew Koyanagi, MD;  Location: [place];  Service: Ophthalmology;  Laterality: Left;   CATARACT EXTRACTION W/PHACO Right 07/22/2017   Procedure: CATARACT EXTRACTION PHACO AND INTRAOCULAR LENS PLACEMENT ([place]) RIGHT BORDERLINE DIABETIC;  Surgeon: Leandrew Koyanagi, MD;  Location: [place];  Service: Ophthalmology;  Laterality: Right;   CORONARY ARTERY BYPASS GRAFT     x5   SKIN LESION EXCISION Left    left ear   URETERAL STENT PLACEMENT      Allergies: Fexofenadine-pseudoephed er, Nifedipine, Pseudoephedrine, Allegra  [fexofenadine], Anoro ellipta [umeclidinium-vilanterol], Carvedilol, Doxycycline hyclate, and Prednisone  Medications: Prior to Admission medications   Medication Sig Start Date End Date Taking? Authorizing Provider  Accu-Chek Softclix Lancets lancets Test blood glucose once daily 05/14/22   Tomasita Morrow, NP  acetaminophen (TYLENOL) 500 MG tablet Take 500 mg by mouth every 6 (six) hours as needed.    [provider]  albuterol (PROAIR HFA) 108 (90 Base) MCG/ACT inhaler INHALE 1-2 PUFFS INTO THE LUNGS EVERY 4 (FOUR) HOURS AS NEEDED FOR WHEEZING OR SHORTNESS OF BREATH. 11/22/21   McLean-Scocuzza, Nino Glow, MD  apixaban [place]) 5  MG TABS tablet TAKE 1 TABLET BY MOUTH TWICE DAILY 04/30/22   Algernon Huxley, MD  aspirin EC 81 MG tablet Take 81 mg by mouth daily.    [provider]  atorvastatin (LIPITOR) 80 MG tablet Take 1 tablet (80 mg total) by mouth daily. 11/22/21   McLean-Scocuzza, Nino Glow, MD  blood glucose meter kit and supplies Dispense based on patient and insurance preference. Use as directed to check blood glucose once daily. 05/13/22   Tomasita Morrow, NP  Cholecalciferol (VITAMIN D3) 2000 UNITS capsule  Take 2,000 Units by mouth daily. am    [provider]  cyclobenzaprine (FLEXERIL) 5 MG tablet 5 mg 3 (three) times daily as needed.    [provider]  docusate sodium (COLACE) 50 MG capsule Take 50 mg by mouth daily as needed.    [provider]  fexofenadine (ALLEGRA) 180 MG tablet Take 1 tablet (180 mg total) by mouth daily as needed for allergies or rhinitis. 11/22/21   McLean-Scocuzza, Nino Glow, MD  fluticasone (FLOVENT HFA) 220 MCG/ACT inhaler Inhale 2 puffs into the lungs 2 (two) times daily. Rinse your mouth 11/22/21   McLean-Scocuzza, Nino Glow, MD  folic acid (FOLVITE) 1 MG tablet Take 0.8 mg by mouth daily. am    [provider]  glucose blood test strip Test blood glucose once daily 05/14/22   Tomasita Morrow, NP  hydrALAZINE (APRESOLINE) 50 MG tablet Take 1 tablet (50 mg total) by mouth 3 (three) times daily. 11/22/21   McLean-Scocuzza, Nino Glow, MD  lidocaine (LIDODERM) 5 % APPLY 1 PATCH BY TOPICAL ROUTE ONCE DAILY (MAY WEAR UP TO 12HOURS.) Patient not taking: Reported on 06/12/2022 10/21/19   [provider]  metoprolol tartrate (LOPRESSOR) 50 MG tablet Take 1 tablet (50 mg total) by mouth 2 (two) times daily. D/c coreg pt prefers metoprolol 11/22/21   McLean-Scocuzza, Nino Glow, MD  Multiple Vitamins-Minerals (CENTRUM SILVER 50+MEN PO) Take by mouth.    [provider]  pantoprazole (PROTONIX) 40 MG tablet Take 1 tablet (40 mg total) by mouth 2 (two) times daily before a meal. TRY TO TAKE ONLY 1X PER DAY INSTEAD OF 2 30 MIN BEFORE A MEAL 11/22/21   McLean-Scocuzza, Nino Glow, MD  tamsulosin (FLOMAX) 0.4 MG CAPS capsule Take 1 capsule by mouth daily. 12/17/20   [provider]  tiZANidine HCl (ZANAFLEX PO) Take 1 tablet by mouth at bedtime as needed (muscle spasms). 05/04/22   [provider]  triamcinolone cream (KENALOG) 0.1 % Apply 1 Application topically 2 (two) times daily. 05/13/22   Tomasita Morrow, NP     Family History  Problem  Relation Age of Onset   Stroke Mother    Heart attack Mother    Dementia Mother    Stroke Father    Stroke Sister    Alzheimer's disease Sister    Dementia Sister    Dementia Sister    Stroke Brother    Alzheimer's disease Maternal Grandmother    Dementia Maternal Grandmother    Stroke Paternal Grandmother    Stroke Paternal Grandfather    Alzheimer's disease Paternal Grandfather    Cancer Neg Hx     Social History   Socioeconomic History   Marital status: Married    Spouse name: Not on file   Number of children: Not on file   Years of education: Not on file   Highest education level: 10th grade  Occupational History   Not on file  Tobacco Use  Smoking status: Former    Packs/day: 2.00    Years: 10.00    Total pack years: 20.00    Types: Cigarettes    Quit date: 04/28/1970    Years since quitting: 52.1   Smokeless tobacco: Former  Scientific laboratory technician Use: Never used  Substance and Sexual Activity   Alcohol use: No    Alcohol/week: 0.0 standard drinks of alcohol    Comment: previous   Drug use: No   Sexual activity: Yes    Comment: wife   Other Topics Concern   Not on file  Social History Narrative   Re-married x 3 years    Retired    Owns guns, wears seat belt, safe in relationship    2 sons and 1 daughter    6 years [employer]    Social Determinants of Health   Financial Resource Strain: Low Risk  (02/14/2022)   Overall Financial Resource Strain (CARDIA)    Difficulty of Paying Living Expenses: Not hard at all  Food Insecurity: No Food Insecurity (05/28/2022)   Hunger Vital Sign    Worried About Running Out of Food in the Last Year: Never true    [place] in the Last Year: Never true  Transportation Needs: No Transportation Needs (05/28/2022)   PRAPARE - Hydrologist (Medical): No    Lack of Transportation (Non-Medical): No  Physical Activity: Insufficiently Active (02/14/2022)   Exercise Vital Sign    Days of  Exercise per Week: 2 days    Minutes of Exercise per Session: 20 min  Stress: No Stress Concern Present (02/14/2022)   [place]    Feeling of Stress : Not at all  Social Connections: Moderately Integrated (02/14/2022)   Social Connection and Isolation Panel [NHANES]    Frequency of Communication with Friends and Family: More than three times a week    Frequency of Social Gatherings with Friends and Family: More than three times a week    Attends Religious Services: More than 4 times per year    Active Member of [employer] or Organizations: No    Attends Archivist Meetings: Never    Marital Status: Married    Review of Systems: A 12 point ROS discussed and pertinent positives are indicated in the HPI above.  All other systems are negative.  Review of Systems  Vital Signs: There were no vitals taken for this visit.  Advance Care Plan: {Advance Care [id number]    Physical Exam  Imaging: CT ABDOMEN PELVIS WO CONTRAST  Result Date: 06/04/2022 CLINICAL DATA:  Unintentional 20 lb weight loss over past year. EXAM: CT ABDOMEN AND PELVIS WITHOUT CONTRAST TECHNIQUE: Multidetector CT imaging of the abdomen and pelvis was performed following the standard protocol without IV contrast. RADIATION DOSE REDUCTION: This exam was performed according to the departmental dose-optimization program which includes automated exposure control, adjustment of the mA and/or kV according to patient size and/or use of iterative reconstruction technique. COMPARISON:  07/17/2005 FINDINGS: Lower chest: Chronic bibasilar interstitial lung disease is new since 2007. Hepatobiliary: No mass visualized on this unenhanced exam. Gallbladder is unremarkable. No evidence of biliary ductal dilatation. Pancreas: No mass or inflammatory process visualized on this unenhanced exam. Spleen:  Within normal limits in size. Adrenals/Urinary tract: No evidence of  urolithiasis or hydronephrosis. Unremarkable unopacified urinary bladder. Stomach/Bowel: New small hiatal hernia. No evidence of obstruction, inflammatory process, or abnormal fluid collections.  Normal appendix visualized. Vascular/Lymphatic: No pathologically enlarged lymph nodes identified. No evidence of abdominal aortic aneurysm. Aortic atherosclerotic calcification incidentally noted. Reproductive:  No mass or other significant abnormality. Other: Stable small bilateral inguinal hernias, which contain only fat. Musculoskeletal:  No suspicious bone lesions identified. IMPRESSION: No evidence of abdominal or pelvic neoplasm. New small hiatal hernia. Stable small bilateral inguinal hernias, which contain only fat. Chronic bibasilar interstitial lung disease, new since 2007. Aortic Atherosclerosis (ICD10-I70.0). Electronically Signed   By: Marlaine Hind M.D.   On: 06/04/2022 13:24    Labs:  CBC: Recent Labs    05/13/22 1324 05/28/22 1221  WBC 7.0 6.0  HGB 12.5* 11.7*  HCT 36.8* 35.5*  PLT 198.0 253    COAGS: No results for input(s): "INR", "APTT" in the last 8760 hours.  BMP: Recent Labs    05/13/22 1324 05/28/22 1221  NA 140 137  K 4.6 4.3  CL 104 102  CO2 28 24  GLUCOSE 142* 129*  BUN 26* 24*  CALCIUM 9.2 8.9  CREATININE 1.61* 1.35*  GFRNONAA  --  53*    LIVER FUNCTION TESTS: Recent Labs    05/13/22 1324 05/28/22 1221  BILITOT 0.4 0.3  AST 25 30  ALT 19 22  ALKPHOS 45 50  PROT 7.1 7.7  ALBUMIN 4.2 3.7    TUMOR MARKERS: No results for input(s): "AFPTM", "CEA", "CA199", "CHROMGRNA" in the last 8760 hours.  Assessment and Plan:  82 year old male with PMHx of CAD, chronic cough, COPD, GERD, heart murmur, HTN left eye macular degeneration, MI, prediabetes, seizure and CVA presents to IR for bone marrow biopsy and aspiration to rule out MDS.  Risks and benefits of bone marrow biopsy and aspiration with moderate sedation was discussed with the patient and/or patient's  family including, but not limited to bleeding, infection, damage to adjacent structures or low yield requiring additional tests.  All of the questions were answered and there is agreement to proceed.  Consent signed and in chart.  Thank you for this interesting consult.  I greatly enjoyed meeting Keith Cruz and look forward to participating in their care.  A copy of this report was sent to the requesting provider on this date.  Electronically Signed: Tyson Alias, NP 06/20/2022, 2:23 PM   I spent a total of {New [id number] {New Out-Pt:[id number]}  {Established Out-Pt:[id number]} in face to face in clinical consultation, greater than 50% of which was counseling/coordinating care for microcytic anemia.

## 2022-06-24 ENCOUNTER — Other Ambulatory Visit: Payer: Self-pay | Admitting: Internal Medicine

## 2022-06-24 DIAGNOSIS — D649 Anemia, unspecified: Secondary | ICD-10-CM

## 2022-06-24 DIAGNOSIS — M545 Low back pain, unspecified: Secondary | ICD-10-CM | POA: Diagnosis not present

## 2022-06-24 NOTE — Progress Notes (Signed)
Patient for Bone Marrow Biopsy on Wed 06/25/2022, I called and spoke with the patient on the  phone and gave pre-procedure instructions. Pt was made aware to be here at 7:30a, NPO after MN prior to procedure as well as driver post procedure/recovery/discharge. Pt stated understanding.  Called 06/24/2022

## 2022-06-25 ENCOUNTER — Ambulatory Visit
Admission: RE | Admit: 2022-06-25 | Discharge: 2022-06-25 | Disposition: A | Discharge status: Home / Self Care | Payer: Medicare Other | Source: Ambulatory Visit | Attending: Internal Medicine | Admitting: Internal Medicine

## 2022-06-25 ENCOUNTER — Other Ambulatory Visit: Payer: Self-pay

## 2022-06-25 DIAGNOSIS — D72822 Plasmacytosis: Secondary | ICD-10-CM | POA: Diagnosis not present

## 2022-06-25 DIAGNOSIS — J449 Chronic obstructive pulmonary disease, unspecified: Secondary | ICD-10-CM | POA: Insufficient documentation

## 2022-06-25 DIAGNOSIS — D649 Anemia, unspecified: Secondary | ICD-10-CM

## 2022-06-25 DIAGNOSIS — R7303 Prediabetes: Secondary | ICD-10-CM | POA: Diagnosis not present

## 2022-06-25 DIAGNOSIS — D539 Nutritional anemia, unspecified: Secondary | ICD-10-CM | POA: Insufficient documentation

## 2022-06-25 DIAGNOSIS — I10 Essential (primary) hypertension: Secondary | ICD-10-CM | POA: Diagnosis not present

## 2022-06-25 DIAGNOSIS — I2510 Atherosclerotic heart disease of native coronary artery without angina pectoris: Secondary | ICD-10-CM | POA: Insufficient documentation

## 2022-06-25 DIAGNOSIS — Z8673 Personal history of transient ischemic attack (TIA), and cerebral infarction without residual deficits: Secondary | ICD-10-CM | POA: Insufficient documentation

## 2022-06-25 DIAGNOSIS — Z87891 Personal history of nicotine dependence: Secondary | ICD-10-CM | POA: Insufficient documentation

## 2022-06-25 DIAGNOSIS — K219 Gastro-esophageal reflux disease without esophagitis: Secondary | ICD-10-CM | POA: Insufficient documentation

## 2022-06-25 DIAGNOSIS — Z1379 Encounter for other screening for genetic and chromosomal anomalies: Secondary | ICD-10-CM | POA: Diagnosis not present

## 2022-06-25 DIAGNOSIS — I252 Old myocardial infarction: Secondary | ICD-10-CM | POA: Diagnosis not present

## 2022-06-25 DIAGNOSIS — H3530 Unspecified macular degeneration: Secondary | ICD-10-CM | POA: Diagnosis not present

## 2022-06-25 LAB — CBC WITH DIFFERENTIAL/PLATELET
Abs Immature Granulocytes: 0.04 10*3/uL (ref 0.00–0.07)
Basophils Absolute: 0.1 10*3/uL (ref 0.0–0.1)
Basophils Relative: 1 %
Eosinophils Absolute: 0.1 10*3/uL (ref 0.0–0.5)
Eosinophils Relative: 1 %
HCT: 36.4 % — ABNORMAL LOW (ref 39.0–52.0)
Hemoglobin: 11.9 g/dL — ABNORMAL LOW (ref 13.0–17.0)
Immature Granulocytes: 1 %
Lymphocytes Relative: 20 %
Lymphs Abs: 1.6 10*3/uL (ref 0.7–4.0)
MCH: 36.4 pg — ABNORMAL HIGH (ref 26.0–34.0)
MCHC: 32.7 g/dL (ref 30.0–36.0)
MCV: 111.3 fL — ABNORMAL HIGH (ref 80.0–100.0)
Monocytes Absolute: 0.7 10*3/uL (ref 0.1–1.0)
Monocytes Relative: 8 %
Neutro Abs: 5.3 10*3/uL (ref 1.7–7.7)
Neutrophils Relative %: 69 %
Platelets: 191 10*3/uL (ref 150–400)
RBC: 3.27 MIL/uL — ABNORMAL LOW (ref 4.22–5.81)
RDW: 14.6 % (ref 11.5–15.5)
Smear Review: NORMAL
WBC: 7.7 10*3/uL (ref 4.0–10.5)
nRBC: 0 % (ref 0.0–0.2)

## 2022-06-25 MED ORDER — FENTANYL CITRATE (PF) 100 MCG/2ML IJ SOLN
INTRAMUSCULAR | Status: AC | PRN
  Administered 2022-06-25 (×2): 50 ug via INTRAVENOUS

## 2022-06-25 MED ORDER — LIDOCAINE HCL (PF) 1 % IJ SOLN
10.0000 mL | Freq: Once | INTRAMUSCULAR | Status: AC
  Administered 2022-06-25: 10 mL

## 2022-06-25 MED ORDER — HEPARIN SOD (PORK) LOCK FLUSH 100 UNIT/ML IV SOLN
INTRAVENOUS | Status: AC
  Filled 2022-06-25: qty 5

## 2022-06-25 MED ORDER — MIDAZOLAM HCL 2 MG/2ML IJ SOLN
INTRAMUSCULAR | Status: AC
  Filled 2022-06-25: qty 2

## 2022-06-25 MED ORDER — FENTANYL CITRATE (PF) 100 MCG/2ML IJ SOLN
INTRAMUSCULAR | Status: AC
  Filled 2022-06-25: qty 2

## 2022-06-25 MED ORDER — MIDAZOLAM HCL 5 MG/5ML IJ SOLN
INTRAMUSCULAR | Status: AC | PRN
  Administered 2022-06-25: 1 mg via INTRAVENOUS

## 2022-06-25 MED ORDER — SODIUM CHLORIDE 0.9 % IV SOLN: INTRAVENOUS | Status: DC

## 2022-06-25 NOTE — Progress Notes (Signed)
Patient clinically stable post BMB per Dr Maryelizabeth Kaufmann, tolerated well. Vitals stable pre and post procedure. Received Versed 1 mg along with Fentanyl 100 mcg IV for procedure. Awake/alert and oriented post procedure. Report given to [place] post procedure/332

## 2022-06-25 NOTE — Procedures (Signed)
Vascular and Interventional Radiology Procedure Note  Patient: Keith Cruz DOB: [birth year] Medical Record Number: [id number] Note Date/Time: 06/25/22 8:52 AM   Performing Physician: Michaelle Birks, MD Assistant(s): None  Diagnosis: Anemia   Procedure: BONE MARROW ASPIRATION and BIOPSY  Anesthesia: Conscious Sedation Complications: None Estimated Blood Loss: Minimal Specimens: Sent for Pathology  Findings:  Successful CT-guided bone marrow aspiration and biopsy A total of 2 cores were obtained. Hemostasis of the tract was achieved using Manual Pressure.  Plan: Bed rest for 1 hours.  See detailed procedure note with images in PACS. The patient tolerated the procedure well without incident or complication and was returned to Recovery in stable condition.    Michaelle Birks, MD [facility] Radiology   Pager. [employer]

## 2022-06-27 LAB — SURGICAL PATHOLOGY

## 2022-06-30 ENCOUNTER — Encounter: Payer: Self-pay | Admitting: Internal Medicine

## 2022-07-02 ENCOUNTER — Encounter (HOSPITAL_COMMUNITY): Payer: Self-pay | Admitting: Internal Medicine

## 2022-07-03 ENCOUNTER — Encounter (HOSPITAL_COMMUNITY): Payer: Self-pay | Admitting: Internal Medicine

## 2022-07-03 DIAGNOSIS — H26492 Other secondary cataract, left eye: Secondary | ICD-10-CM | POA: Diagnosis not present

## 2022-07-09 ENCOUNTER — Inpatient Hospital Stay: Payer: Medicare Other | Attending: Internal Medicine | Admitting: Internal Medicine

## 2022-07-09 ENCOUNTER — Encounter: Payer: Self-pay | Admitting: Internal Medicine

## 2022-07-09 VITALS — BP 149/53 | HR 68 | Temp 97.5°F | Resp 18 | Wt 209.4 lb

## 2022-07-09 DIAGNOSIS — G8929 Other chronic pain: Secondary | ICD-10-CM | POA: Diagnosis not present

## 2022-07-09 DIAGNOSIS — I129 Hypertensive chronic kidney disease with stage 1 through stage 4 chronic kidney disease, or unspecified chronic kidney disease: Secondary | ICD-10-CM | POA: Diagnosis not present

## 2022-07-09 DIAGNOSIS — I2510 Atherosclerotic heart disease of native coronary artery without angina pectoris: Secondary | ICD-10-CM | POA: Diagnosis not present

## 2022-07-09 DIAGNOSIS — D649 Anemia, unspecified: Secondary | ICD-10-CM | POA: Diagnosis not present

## 2022-07-09 DIAGNOSIS — D4620 Refractory anemia with excess of blasts, unspecified: Secondary | ICD-10-CM | POA: Diagnosis not present

## 2022-07-09 DIAGNOSIS — I4891 Unspecified atrial fibrillation: Secondary | ICD-10-CM | POA: Diagnosis not present

## 2022-07-09 DIAGNOSIS — J449 Chronic obstructive pulmonary disease, unspecified: Secondary | ICD-10-CM | POA: Diagnosis not present

## 2022-07-09 DIAGNOSIS — Z87891 Personal history of nicotine dependence: Secondary | ICD-10-CM | POA: Diagnosis not present

## 2022-07-09 DIAGNOSIS — D472 Monoclonal gammopathy: Secondary | ICD-10-CM | POA: Insufficient documentation

## 2022-07-09 DIAGNOSIS — N183 Chronic kidney disease, stage 3 unspecified: Secondary | ICD-10-CM | POA: Diagnosis not present

## 2022-07-09 NOTE — Progress Notes (Signed)
Left back/hip pain getting worse.  Seen by [facility] and got MRI yesterday.

## 2022-07-09 NOTE — Assessment & Plan Note (Addendum)
#   Anemia: Hemoglobin:[[name] 2023] 12 MCV-110; normal white count/platelets.   [name] 1157-W62 folic acid normal [PCP]; FEB 7th, CT scan AP-negative for hepatosplenomegaly or cirrhosis.  # MARCH 2024-low-grade MDS - bone marrow biopsy hypercellular; clonal plasma cells noted; myeloma panel FISH panel negative.  Cytogenetics normal.  Recommend foundation 1 heme-MDS panel.  # I reviewed the pathophysiology of low-grade MDS the patient and his wife.  I also again reviewed the treatment options of MDS include growth factor injections, tablets versus chemotherapy.  Await foundation 1 hem results.  Again given no obvious clinical symptoms from underlying MDS-I would recommend surveillance.   # MGUS: immunofixation shows IgA monoclonal protein with kappa light chain specificity.  Kappa lambda light chain ratio= 2.72.  Bone marrow biopsy- March 2024-6% plasma cells.  Again no role for any therapy at this time.  Continue surveillance every 6 months  or so labs.   # COPD /cough-defer to pulmonary/Dr.Fleming-for further recommendations.  # Chronic kidney disease stage III-[Dr.Singh]- stable.   # Chronic back pain-unrelated to MDS/MGUS; recommend follow-up with orthopedics.   # A.fib/CAD [Dr.Kowalski/[facility]] - on eliquis   # DISPOSITION: # follow up in 3 month MD;labs- cbc/cmp; LDH; Foundation one hem- re: MDS-  Dr.B

## 2022-07-09 NOTE — Progress Notes (Signed)
[facility] NOTE  Patient Care Team: Tomasita Morrow, NP as PCP - General (Nurse Practitioner)  CHIEF COMPLAINTS/PURPOSE OF CONSULTATION: ANEMIA   HEMATOLOGY HISTORY  # ANEMIA[Hb; MCV-platelets- WBC; Iron sat; ferritin;  GFR- CT/US; EGD/colonoscopy-  # Anemia: Hemoglobin:[[name] [id number] 12 MCV-110; normal white count/platelets.   [name] [id number] folic acid normal [PCP]; FEB 7th, CT scan AP-negative for hepatosplenomegaly or cirrhosis.  MARCH 2024-bone marrow biopsy hypercellular; clonal plasma cells noted 6%; M-protein negative; kappa lambda light chain ratio= slightly abnormal;  myeloma panel FISH panel negative.  Cytogenetics normal.  MARCH 2024- foundation 1 heme-MDS panel.  HISTORY OF PRESENTING ILLNESS: HOH; ambulating independently.  Accompanied by his wife.  Keith Cruz 82 y.o.  male patient with multiple medical problems including COPD/A-fib on Eliquis CKD stage III and chronic macrocytic anemia-is here to review the results of his bone marrow biopsy.  Bone marrow biopsy uneventful.   However noted to have chronic/acute worsening of left back/hip pain .  Seen by [facility] and got MRI yesterday.  Patient's chronic cough slightly worse.  Patient noted to have macrocytosis for the last 2 to 3 years.  Progressively getting worse.  Noted to have anemia around 12.    Patient denies any night sweats.  Denies any lumps or bumps.     Review of Systems  Constitutional:  Positive for malaise/fatigue and weight loss. Negative for chills and diaphoresis.  HENT:  Negative for nosebleeds and sore throat.   Eyes:  Negative for double vision.  Respiratory:  Negative for cough, hemoptysis, sputum production, shortness of breath and wheezing.   Cardiovascular:  Negative for chest pain, palpitations, orthopnea and leg swelling.  Gastrointestinal:  Negative for abdominal pain, blood in stool, constipation, diarrhea, heartburn, melena, nausea and vomiting.  Genitourinary:   Negative for dysuria, frequency and urgency.  Musculoskeletal:  Positive for back pain and joint pain.  Skin: Negative.  Negative for itching and rash.  Neurological:  Negative for dizziness, tingling, focal weakness, weakness and headaches.  Endo/Heme/Allergies:  Does not bruise/bleed easily.  Psychiatric/Behavioral:  Negative for depression. The patient is not nervous/anxious and does not have insomnia.      MEDICAL HISTORY:  Past Medical History:  Diagnosis Date   Arthritis    back   Asthma    Atherosclerosis of aorta (HCC)    Atherosclerosis of aorta (HCC)    Bronchitis 05/2017   chronic   CAD (coronary artery disease)    Carotid artery disease (HCC)    Carotid stenosis    Carotid stenosis, bilateral    Chronic bronchitis (HCC)    Chronic cough    COPD (chronic obstructive pulmonary disease) (HCC)    Dysrhythmia    GERD (gastroesophageal reflux disease)    Heart murmur    HOH (hard of hearing)    hearing aids   Hypercholesteremia    Hypertension    Kidney disease    stent   Macular degeneration of left eye    Myocardial infarction ([place]) 2016   Pre-diabetes    Seasonal allergies    Seizure ([place])    Seizures ([place])    viral meningitis   Stroke [facility])    1991 and imaging 05/12/15 old strokes   TIA (transient ischemic attack) 1991   x2   Uses hearing aid    Viral meningitis 2017   Viral meningitis    Wears dentures    upper and lower    SURGICAL HISTORY: Past Surgical History:  Procedure Laterality Date  CAROTID ENDARTERECTOMY Left 2014   CAROTID ENDARTERECTOMY Right 1991   CAROTID PTA/STENT INTERVENTION Left 05/31/2020   Procedure: CAROTID PTA/STENT INTERVENTIO;  Surgeon: Algernon Huxley, MD;  Location: [facility];  Service: Cardiovascular;  Laterality: Left;   CATARACT EXTRACTION W/PHACO Left 06/24/2017   Procedure: CATARACT EXTRACTION PHACO AND INTRAOCULAR LENS PLACEMENT ([place]) LEFT BORDERLINE DIABETIC;  Surgeon: Leandrew Koyanagi, MD;  Location:  [place];  Service: Ophthalmology;  Laterality: Left;   CATARACT EXTRACTION W/PHACO Right 07/22/2017   Procedure: CATARACT EXTRACTION PHACO AND INTRAOCULAR LENS PLACEMENT ([place]) RIGHT BORDERLINE DIABETIC;  Surgeon: Leandrew Koyanagi, MD;  Location: [place];  Service: Ophthalmology;  Laterality: Right;   CORONARY ARTERY BYPASS GRAFT     x5   SKIN LESION EXCISION Left    left ear   URETERAL STENT PLACEMENT      SOCIAL HISTORY: Social History   Socioeconomic History   Marital status: Married    Spouse name: Not on file   Number of children: Not on file   Years of education: Not on file   Highest education level: 10th grade  Occupational History   Not on file  Tobacco Use   Smoking status: Former    Packs/day: 2.00    Years: 10.00    Total pack years: 20.00    Types: Cigarettes    Quit date: 04/28/1970    Years since quitting: 52.2   Smokeless tobacco: Former  Scientific laboratory technician Use: Never used  Substance and Sexual Activity   Alcohol use: No    Alcohol/week: 0.0 standard drinks of alcohol    Comment: previous   Drug use: No   Sexual activity: Yes    Comment: wife   Other Topics Concern   Not on file  Social History Narrative   Re-married x 3 years wife named Psychologist, forensic pets, 1 dog.    Retired    Chartered loss adjuster, wears seat belt, safe in relationship    2 sons and 1 daughter    6 years [employer]    Social Determinants of Health   Financial Resource Strain: Low Risk  (02/14/2022)   Overall Financial Resource Strain (CARDIA)    Difficulty of Paying Living Expenses: Not hard at all  Food Insecurity: No Food Insecurity (05/28/2022)   Hunger Vital Sign    Worried About Running Out of Food in the Last Year: Never true    Ran Out of Food in the Last Year: Never true  Transportation Needs: No Transportation Needs (05/28/2022)   PRAPARE - Hydrologist (Medical): No    Lack of Transportation (Non-Medical): No   Physical Activity: Insufficiently Active (02/14/2022)   Exercise Vital Sign    Days of Exercise per Week: 2 days    Minutes of Exercise per Session: 20 min  Stress: No Stress Concern Present (02/14/2022)   College    Feeling of Stress : Not at all  Social Connections: Moderately Integrated (02/14/2022)   Social Connection and Isolation Panel [NHANES]    Frequency of Communication with Friends and Family: More than three times a week    Frequency of Social Gatherings with Friends and Family: More than three times a week    Attends Religious Services: More than 4 times per year    Active Member of [employer] or Organizations: No    Attends Archivist Meetings: Never    Marital  Status: Married  Human resources officer Violence: Not At Risk (05/28/2022)   Humiliation, Afraid, Rape, and Kick questionnaire    Fear of Current or Ex-Partner: No    Emotionally Abused: No    Physically Abused: No    Sexually Abused: No    FAMILY HISTORY: Family History  Problem Relation Age of Onset   Stroke Mother    Heart attack Mother    Dementia Mother    Stroke Father    Stroke Sister    Alzheimer's disease Sister    Dementia Sister    Dementia Sister    Stroke Brother    Alzheimer's disease Maternal Grandmother    Dementia Maternal Grandmother    Stroke Paternal Grandmother    Stroke Paternal Grandfather    Alzheimer's disease Paternal Grandfather    Cancer Neg Hx     ALLERGIES:  is allergic to fexofenadine-pseudoephed er, nifedipine, pseudoephedrine, allegra  [fexofenadine], anoro ellipta [umeclidinium-vilanterol], carvedilol, doxycycline hyclate, and prednisone.  MEDICATIONS:  Current Outpatient Medications  Medication Sig Dispense Refill   Accu-Chek Softclix Lancets lancets Test blood glucose once daily 100 each 12   acetaminophen (TYLENOL) 500 MG tablet Take 500 mg by mouth every 6 (six) hours as needed.      albuterol (PROAIR HFA) 108 (90 Base) MCG/ACT inhaler INHALE 1-2 PUFFS INTO THE LUNGS EVERY 4 (FOUR) HOURS AS NEEDED FOR WHEEZING OR SHORTNESS OF BREATH. 18 each 12   apixaban (ELIQUIS) 5 MG TABS tablet TAKE 1 TABLET BY MOUTH TWICE DAILY 60 tablet 5   aspirin EC 81 MG tablet Take 81 mg by mouth daily.     atorvastatin (LIPITOR) 80 MG tablet Take 1 tablet (80 mg total) by mouth daily. 90 tablet 3   blood glucose meter kit and supplies Dispense based on patient and insurance preference. Use as directed to check blood glucose once daily. 1 each 0   Cholecalciferol (VITAMIN D3) 2000 UNITS capsule Take 2,000 Units by mouth daily. am     docusate sodium (COLACE) 50 MG capsule Take 50 mg by mouth daily as needed.     fexofenadine (ALLEGRA) 180 MG tablet Take 1 tablet (180 mg total) by mouth daily as needed for allergies or rhinitis. 90 tablet 3   fluticasone (FLOVENT HFA) 220 MCG/ACT inhaler Inhale 2 puffs into the lungs 2 (two) times daily. Rinse your mouth 12 each 11   folic acid (FOLVITE) 1 MG tablet Take 0.8 mg by mouth daily. am     glucose blood test strip Test blood glucose once daily 100 each 12   hydrALAZINE (APRESOLINE) 50 MG tablet Take 1 tablet (50 mg total) by mouth 3 (three) times daily. 270 tablet 3   metoprolol tartrate (LOPRESSOR) 50 MG tablet Take 1 tablet (50 mg total) by mouth 2 (two) times daily. D/c coreg pt prefers metoprolol 180 tablet 3   Multiple Vitamins-Minerals (CENTRUM SILVER 50+MEN PO) Take by mouth.     pantoprazole (PROTONIX) 40 MG tablet Take 1 tablet (40 mg total) by mouth 2 (two) times daily before a meal. TRY TO TAKE ONLY 1X PER DAY INSTEAD OF 2 30 MIN BEFORE A MEAL 180 tablet 3   tamsulosin (FLOMAX) 0.4 MG CAPS capsule Take 1 capsule by mouth daily.     triamcinolone cream (KENALOG) 0.1 % Apply 1 Application topically 2 (two) times daily. 30 g 0   cyclobenzaprine (FLEXERIL) 5 MG tablet 5 mg 3 (three) times daily as needed. (Patient not taking: Reported on 07/09/2022)  lidocaine (LIDODERM) 5 % APPLY 1 PATCH BY TOPICAL ROUTE ONCE DAILY (MAY WEAR UP TO 12HOURS.) (Patient not taking: Reported on 06/12/2022)     tiZANidine HCl (ZANAFLEX PO) Take 1 tablet by mouth at bedtime as needed (muscle spasms). (Patient not taking: Reported on 07/09/2022)     Current Facility-Administered Medications  Medication Dose Route Frequency Provider Last Rate Last Admin   ipratropium-albuterol (DUONEB) 0.5-2.5 (3) MG/3ML nebulizer solution 3 mL  3 mL Nebulization Once Karamalegos, Alexander J, DO         .  PHYSICAL EXAMINATION:   Vitals:   07/09/22 1500  BP: (!) 149/53  Pulse: 68  Resp: 18  Temp: (!) 97.5 F (36.4 C)  SpO2: 95%   Filed Weights   07/09/22 1500  Weight: 209 lb 6.4 oz (95 kg)    Physical Exam Vitals and nursing note reviewed.  HENT:     Head: Normocephalic and atraumatic.     Mouth/Throat:     Pharynx: Oropharynx is clear.  Eyes:     Extraocular Movements: Extraocular movements intact.     Pupils: Pupils are equal, round, and reactive to light.  Cardiovascular:     Rate and Rhythm: Normal rate and regular rhythm.  Pulmonary:     Comments: Decreased breath sounds bilaterally.  Abdominal:     Palpations: Abdomen is soft.  Musculoskeletal:        General: Normal range of motion.     Cervical back: Normal range of motion.  Skin:    General: Skin is warm.  Neurological:     General: No focal deficit present.     Mental Status: He is alert and oriented to person, place, and time.  Psychiatric:        Behavior: Behavior normal.        Judgment: Judgment normal.      LABORATORY DATA:  I have reviewed the data as listed Lab Results  Component Value Date   WBC 7.7 06/25/2022   HGB 11.9 (L) 06/25/2022   HCT 36.4 (L) 06/25/2022   MCV 111.3 (H) 06/25/2022   PLT 191 06/25/2022   Recent Labs    05/13/22 1324 05/28/22 1221  NA 140 137  K 4.6 4.3  CL 104 102  CO2 28 24  GLUCOSE 142* 129*  BUN 26* 24*  CREATININE 1.61* 1.35*   CALCIUM 9.2 8.9  GFRNONAA  --  53*  PROT 7.1 7.7  ALBUMIN 4.2 3.7  AST 25 30  ALT 19 22  ALKPHOS 45 50  BILITOT 0.4 0.3     CT BONE MARROW BIOPSY & ASPIRATION  Result Date: 06/25/2022 INDICATION: macrocytic anemia EXAM: CT GUIDED BONE MARROW ASPIRATION AND CORE BIOPSY MEDICATIONS: None. ANESTHESIA/SEDATION: Moderate (conscious) sedation was employed during this procedure. A total of Versed 1 mg and Fentanyl 100 mcg was administered intravenously. Moderate Sedation Time: 18 minutes. The patient's level of consciousness and vital signs were monitored continuously by radiology nursing throughout the procedure under my direct supervision. FLUOROSCOPY TIME:  CT dose in mGy was not provided. COMPLICATIONS: None immediate. Estimated blood loss: <5 mL PROCEDURE: RADIATION DOSE REDUCTION: This exam was performed according to the departmental dose-optimization program which includes automated exposure control, adjustment of the mA and/or kV according to patient size and/or use of iterative reconstruction technique. Informed written consent was obtained from the the patient and/or patient's representative after a thorough discussion of the procedural risks, benefits and alternatives. All questions were addressed. Maximal Sterile Barrier Technique was utilized including  caps, mask, sterile gowns, sterile gloves, sterile drape, hand hygiene and skin antiseptic. A timeout was performed prior to the initiation of the procedure. The patient was positioned prone and non-contrast localization CT was performed of the pelvis to demonstrate the iliac marrow spaces. Maximal barrier sterile technique utilized including caps, mask, sterile gowns, sterile gloves, large sterile drape, hand hygiene, and chlorhexidine prep. Under sterile conditions and local anesthesia, an 11 gauge coaxial bone biopsy needle was advanced into the RIGHT iliac marrow space. Needle position was confirmed with CT imaging. Initially, bone marrow  aspiration was performed. Next, the 11 gauge outer cannula was utilized to obtain a 2 iliac bone marrow core biopsy. Needle was removed. Hemostasis was obtained with compression. The patient tolerated the procedure well. Samples were prepared with the cytotechnologist. IMPRESSION: Successful CT-guided bone marrow aspiration and biopsy. Michaelle Birks, MD [facility] Radiology Electronically Signed   By: Michaelle Birks M.D.   On: 06/25/2022 10:33    ASSESSMENT & PLAN:   Symptomatic anemia # Anemia: Hemoglobin:[[name] [id number] 12 MCV-110; normal white count/platelets.   [name] 99991111 folic acid normal [PCP]; FEB 7th, CT scan AP-negative for hepatosplenomegaly or cirrhosis.  # MARCH 2024-low-grade MDS - bone marrow biopsy hypercellular; clonal plasma cells noted; myeloma panel FISH panel negative.  Cytogenetics normal.  Recommend foundation 1 heme-MDS panel.  # I reviewed the pathophysiology of low-grade MDS the patient and his wife.  I also again reviewed the treatment options of MDS include growth factor injections, tablets versus chemotherapy.  Await foundation 1 hem results.  Again given no obvious clinical symptoms from underlying MDS-I would recommend surveillance.   # MGUS: immunofixation shows IgA monoclonal protein with kappa light chain specificity.  Kappa lambda light chain ratio= 2.72.  Bone marrow biopsy- March 2024-6% plasma cells.  Again no role for any therapy at this time.  Continue surveillance every 6 months  or so labs.   # COPD /cough-defer to pulmonary/Dr.Fleming-for further recommendations.  # Chronic kidney disease stage III-[Dr.Singh]- stable.   # Chronic back pain-unrelated to MDS/MGUS; recommend follow-up with orthopedics.   # A.fib/CAD [Dr.Kowalski/[facility]] - on eliquis   # DISPOSITION: # follow up in 3 month MD;labs- cbc/cmp; LDH; Foundation one hem- re: MDS-  Dr.B   All questions were answered. The patient knows to call the  clinic with any problems, questions or concerns.   Cammie Sickle, MD 07/09/2022 4:53 PM

## 2022-07-10 DIAGNOSIS — M51369 Other intervertebral disc degeneration, lumbar region without mention of lumbar back pain or lower extremity pain: Secondary | ICD-10-CM | POA: Insufficient documentation

## 2022-07-10 DIAGNOSIS — M5451 Vertebrogenic low back pain: Secondary | ICD-10-CM | POA: Diagnosis not present

## 2022-07-10 DIAGNOSIS — M5136 Other intervertebral disc degeneration, lumbar region: Secondary | ICD-10-CM | POA: Diagnosis not present

## 2022-07-28 DIAGNOSIS — H26492 Other secondary cataract, left eye: Secondary | ICD-10-CM | POA: Diagnosis not present

## 2022-08-18 ENCOUNTER — Telehealth: Payer: Self-pay

## 2022-08-18 NOTE — Telephone Encounter (Signed)
Crystal is calling from [facility] to state she is following-up on a medical clearance for patient.  Crystal states they faxed it to [place] on 07/30/2022 and 08/06/2022.  Crystal states she spoke with Morrie Sheldon on 08/06/2022 and was told the medical clearance was pending.  I spoke with Donavan Foil, CMA, and she states she has not seen the medical clearance for patient.  I asked Crystal to please fax it to [place] again.  I did let Crystal know that [name], NP, is out of the office until 08/21/2022.

## 2022-08-21 NOTE — Telephone Encounter (Signed)
Upon further research we  discovered that on 4/9/245 we faxed over this request to Dr. Festus Barren [phone]  who is in charge of pts Eliquis.   [name] does not want to advise against taking the Eliquis per the request question she wanted Dr. Festus Barren to advise, upon several attempts to contact Dr. Driscilla Grammes office a voicemail was left on the nurse's line to call back to see if they received the fax so Dr. Wyn Quaker can advise.

## 2022-08-21 NOTE — Telephone Encounter (Signed)
Received an incoming call from Dr. Kristeen Mans office and spoke with Ms. Cordelia Pen,   I explained to her the forms that I have faxed over, and she stated Dr. Wyn Quaker just signed off on some forms today and that she will look out for the pts name.   She asked me to re fax the form as well attn to her and I had her confirm that I have the correct fax number for them which is [phone]  New form has been faxed over.

## 2022-08-25 NOTE — Telephone Encounter (Signed)
Dr. Kristeen Mans office has returned the form completed and signed and I have faxed it to [employer] to fax number [phone] with a completed transmission log.

## 2022-09-10 ENCOUNTER — Other Ambulatory Visit: Payer: Self-pay | Admitting: Nurse Practitioner

## 2022-09-10 DIAGNOSIS — L259 Unspecified contact dermatitis, unspecified cause: Secondary | ICD-10-CM

## 2022-09-16 DIAGNOSIS — Z01818 Encounter for other preprocedural examination: Secondary | ICD-10-CM | POA: Diagnosis not present

## 2022-09-16 DIAGNOSIS — J439 Emphysema, unspecified: Secondary | ICD-10-CM | POA: Diagnosis not present

## 2022-09-16 DIAGNOSIS — R0609 Other forms of dyspnea: Secondary | ICD-10-CM | POA: Diagnosis not present

## 2022-10-07 ENCOUNTER — Ambulatory Visit (INDEPENDENT_AMBULATORY_CARE_PROVIDER_SITE_OTHER): Payer: Medicare Other | Admitting: Vascular Surgery

## 2022-10-07 ENCOUNTER — Ambulatory Visit (INDEPENDENT_AMBULATORY_CARE_PROVIDER_SITE_OTHER): Payer: Medicare Other

## 2022-10-07 ENCOUNTER — Encounter (INDEPENDENT_AMBULATORY_CARE_PROVIDER_SITE_OTHER): Payer: Self-pay | Admitting: Vascular Surgery

## 2022-10-07 VITALS — BP 124/65 | HR 81 | Resp 16 | Wt 208.6 lb

## 2022-10-07 DIAGNOSIS — E785 Hyperlipidemia, unspecified: Secondary | ICD-10-CM | POA: Diagnosis not present

## 2022-10-07 DIAGNOSIS — I10 Essential (primary) hypertension: Secondary | ICD-10-CM | POA: Diagnosis not present

## 2022-10-07 DIAGNOSIS — I701 Atherosclerosis of renal artery: Secondary | ICD-10-CM

## 2022-10-07 DIAGNOSIS — I6523 Occlusion and stenosis of bilateral carotid arteries: Secondary | ICD-10-CM

## 2022-10-07 DIAGNOSIS — N183 Chronic kidney disease, stage 3 unspecified: Secondary | ICD-10-CM | POA: Diagnosis not present

## 2022-10-07 NOTE — Assessment & Plan Note (Signed)
Duplex today show 1 to 39% stenosis by velocity criteria bilaterally with a patent right carotid endarterectomy and a patent left carotid stent.  Doing well.  Continue current medical regimen.  Continue to follow on an annual basis.

## 2022-10-07 NOTE — Assessment & Plan Note (Signed)
Duplex today shows a widely patent right renal artery stent and no hemodynamically significant left renal artery stenosis.  Blood pressure control seems stable.  No change in medications.  Recheck in 1 year.

## 2022-10-07 NOTE — Progress Notes (Signed)
MRN : [id number]  Keith Cruz is a 82 y.o. (25-Jun-1940) male who presents with chief complaint of  Chief Complaint  Patient presents with   Follow-up    Ultrasound follow up  .  History of Present Illness: Patient returns today in follow up of multiple vascular issues.  He has been diagnosed with multiple myeloma but is trying to keep his spirits up.  No focal neurologic symptoms since his last visit.  His most recent carotid intervention was a left carotid stent for recurrent carotid stenosis about 2-1/2 years ago.  Duplex today show 1 to 39% stenosis by velocity criteria bilaterally with a patent right carotid endarterectomy and a patent left carotid stent. He is also followed for renal artery stenosis.  He is many years status post right renal artery stent placement for renovascular hypertension and chronic kidney disease.  His blood pressure has been stable.  He is more concerned about his kidney function now with myeloma which is appropriate.  Duplex today shows a widely patent right renal artery stent and no hemodynamically significant left renal artery stenosis.   Current Outpatient Medications  Medication Sig Dispense Refill   Accu-Chek Softclix Lancets lancets Test blood glucose once daily 100 each 12   acetaminophen (TYLENOL) 500 MG tablet Take 500 mg by mouth every 6 (six) hours as needed.     albuterol (PROAIR HFA) 108 (90 Base) MCG/ACT inhaler INHALE 1-2 PUFFS INTO THE LUNGS EVERY 4 (FOUR) HOURS AS NEEDED FOR WHEEZING OR SHORTNESS OF BREATH. 18 each 12   apixaban (ELIQUIS) 5 MG TABS tablet TAKE 1 TABLET BY MOUTH TWICE DAILY 60 tablet 5   aspirin EC 81 MG tablet Take 81 mg by mouth daily.     atorvastatin (LIPITOR) 80 MG tablet Take 1 tablet (80 mg total) by mouth daily. 90 tablet 3   blood glucose meter kit and supplies Dispense based on patient and insurance preference. Use as directed to check blood glucose once daily. 1 each 0   Cholecalciferol (VITAMIN D3) 2000 UNITS  capsule Take 2,000 Units by mouth daily. am     fexofenadine (ALLEGRA) 180 MG tablet Take 1 tablet (180 mg total) by mouth daily as needed for allergies or rhinitis. 90 tablet 3   folic acid (FOLVITE) 1 MG tablet Take 0.8 mg by mouth daily. am     glucose blood test strip Test blood glucose once daily 100 each 12   hydrALAZINE (APRESOLINE) 50 MG tablet Take 1 tablet (50 mg total) by mouth 3 (three) times daily. 270 tablet 3   metoprolol tartrate (LOPRESSOR) 50 MG tablet Take 1 tablet (50 mg total) by mouth 2 (two) times daily. D/c coreg pt prefers metoprolol 180 tablet 3   Multiple Vitamins-Minerals (CENTRUM SILVER 50+MEN PO) Take by mouth.     pantoprazole (PROTONIX) 40 MG tablet Take 1 tablet (40 mg total) by mouth 2 (two) times daily before a meal. TRY TO TAKE ONLY 1X PER DAY INSTEAD OF 2 30 MIN BEFORE A MEAL 180 tablet 3   tamsulosin (FLOMAX) 0.4 MG CAPS capsule Take 1 capsule by mouth daily.     triamcinolone cream (KENALOG) 0.1 % APPLY TO AFFECTED AREA TWICE A DAY 30 g 0   cyclobenzaprine (FLEXERIL) 5 MG tablet 5 mg 3 (three) times daily as needed. (Patient not taking: Reported on 07/09/2022)     docusate sodium (COLACE) 50 MG capsule Take 50 mg by mouth daily as needed.     fluticasone (FLOVENT HFA)  220 MCG/ACT inhaler Inhale 2 puffs into the lungs 2 (two) times daily. Rinse your mouth 12 each 11   lidocaine (LIDODERM) 5 %      tiZANidine HCl (ZANAFLEX PO) Take 1 tablet by mouth at bedtime as needed (muscle spasms). (Patient not taking: Reported on 07/09/2022)     Current Facility-Administered Medications  Medication Dose Route Frequency Provider Last Rate Last Admin   ipratropium-albuterol (DUONEB) 0.5-2.5 (3) MG/3ML nebulizer solution 3 mL  3 mL Nebulization Once Smitty Cords, DO        Past Medical History:  Diagnosis Date   Arthritis    back   Asthma    Atherosclerosis of aorta (HCC)    Atherosclerosis of aorta (HCC)    Bronchitis 05/2017   chronic   CAD (coronary  artery disease)    Carotid artery disease (HCC)    Carotid stenosis    Carotid stenosis, bilateral    Chronic bronchitis (HCC)    Chronic cough    COPD (chronic obstructive pulmonary disease) (HCC)    Dysrhythmia    GERD (gastroesophageal reflux disease)    Heart murmur    HOH (hard of hearing)    hearing aids   Hypercholesteremia    Hypertension    Kidney disease    stent   Macular degeneration of left eye    Myocardial infarction (HCC) 2016   Pre-diabetes    Seasonal allergies    Seizure (HCC)    Seizures (HCC)    viral meningitis   Stroke [facility])    1991 and imaging 05/12/15 old strokes   TIA (transient ischemic attack) 1991   x2   Uses hearing aid    Viral meningitis 2017   Viral meningitis    Wears dentures    upper and lower    Past Surgical History:  Procedure Laterality Date   CAROTID ENDARTERECTOMY Left 2014   CAROTID ENDARTERECTOMY Right 1991   CAROTID PTA/STENT INTERVENTION Left 05/31/2020   Procedure: CAROTID PTA/STENT INTERVENTIO;  Surgeon: Annice Needy, MD;  Location: [facility] INVASIVE CV LAB;  Service: Cardiovascular;  Laterality: Left;   CATARACT EXTRACTION W/PHACO Left 06/24/2017   Procedure: CATARACT EXTRACTION PHACO AND INTRAOCULAR LENS PLACEMENT (IOC) LEFT BORDERLINE DIABETIC;  Surgeon: Lockie Mola, MD;  Location: [facility];  Service: Ophthalmology;  Laterality: Left;   CATARACT EXTRACTION W/PHACO Right 07/22/2017   Procedure: CATARACT EXTRACTION PHACO AND INTRAOCULAR LENS PLACEMENT (IOC) RIGHT BORDERLINE DIABETIC;  Surgeon: Lockie Mola, MD;  Location: [facility];  Service: Ophthalmology;  Laterality: Right;   CORONARY ARTERY BYPASS GRAFT     x5   SKIN LESION EXCISION Left    left ear   URETERAL STENT PLACEMENT       Social History   Tobacco Use   Smoking status: Former    Packs/day: 2.00    Years: 10.00    Additional pack years: 0.00    Total pack years: 20.00    Types: Cigarettes    Quit date: 04/28/1970     Years since quitting: 52.4   Smokeless tobacco: Former  Building services engineer Use: Never used  Substance Use Topics   Alcohol use: No    Alcohol/week: 0.0 standard drinks of alcohol    Comment: previous   Drug use: No       Family History  Problem Relation Age of Onset   Stroke Mother    Heart attack Mother    Dementia Mother    Stroke Father  Stroke Sister    Alzheimer's disease Sister    Dementia Sister    Dementia Sister    Stroke Brother    Alzheimer's disease Maternal Grandmother    Dementia Maternal Grandmother    Stroke Paternal Grandmother    Stroke Paternal Grandfather    Alzheimer's disease Paternal Grandfather    Cancer Neg Hx      Allergies  Allergen Reactions   Fexofenadine-Pseudoephed Er Swelling    Tongue and face. Only with D product. Tolerates plain allegra at home.  .    Nifedipine Swelling    Tongue and face     Pseudoephedrine Swelling   Allegra  [Fexofenadine] Swelling    Tongue and face. Only with D product. Tolerates plain allegra at home.    Anoro Ellipta [Umeclidinium-Vilanterol]    Carvedilol Other (See Comments)    Light headed, heart racing, increase in blood pressure    Doxycycline Hyclate Other (See Comments)   Prednisone Other (See Comments)    Oral prednisone. Confusion. Mental status changes    REVIEW OF SYSTEMS (Negative unless checked)   Constitutional: [x] Weight loss  [] Fever  [] Chills Cardiac: [] Chest pain   [] Chest pressure   [] Palpitations   [] Shortness of breath when laying flat   [] Shortness of breath at rest   [] Shortness of breath with exertion. Vascular:  [] Pain in legs with walking   [] Pain in legs at rest   [] Pain in legs when laying flat   [] Claudication   [] Pain in feet when walking  [] Pain in feet at rest  [] Pain in feet when laying flat   [] History of DVT   [] Phlebitis   [x] Swelling in legs   [] Varicose veins   [] Non-healing ulcers Pulmonary:   [] Uses home oxygen   [] Productive cough   [] Hemoptysis   [] Wheeze   [] COPD   [] Asthma Neurologic:  [] Dizziness  [] Blackouts   [] Seizures   [x] History of stroke   [] History of TIA  [] Aphasia   [] Temporary blindness   [] Dysphagia   [] Weakness or numbness in arms   [] Weakness or numbness in legs Musculoskeletal:  [x] Arthritis   [] Joint swelling   [x] Joint pain   [] Low back pain Hematologic:  [] Easy bruising  [] Easy bleeding   [] Hypercoagulable state   [] Anemic   Gastrointestinal:  [] Blood in stool   [] Vomiting blood  [x] Gastroesophageal reflux/heartburn   [] Abdominal pain Genitourinary:  [x] Chronic kidney disease   [] Difficult urination  [] Frequent urination  [] Burning with urination   [] Hematuria Skin:  [] Rashes   [] Ulcers   [] Wounds Psychological:  [] History of anxiety   []  History of major depression.  Physical Examination  BP 124/65 (BP Location: Right Arm)   Pulse 81   Resp 16   Wt 208 lb 9.6 oz (94.6 kg)   BMI 25.39 kg/m  Gen:  WD/WN, NAD Head: [place]/AT, No temporalis wasting. Ear/Nose/Throat: Hearing grossly intact, nares w/o erythema or drainage Eyes: Conjunctiva clear. Sclera non-icteric Neck: Supple.  Trachea midline Pulmonary:  Good air movement, no use of accessory muscles.  Cardiac: RRR, no JVD Vascular:  Vessel Right Left  Radial Palpable Palpable               Musculoskeletal: M/S 5/5 throughout.  No deformity or atrophy.  No lower extremity edema. Neurologic: Sensation grossly intact in extremities.  Symmetrical.  Speech is fluent.  Psychiatric: Judgment intact, Mood & affect appropriate for pt's clinical situation. Dermatologic: No rashes or ulcers noted.  No cellulitis or open wounds.      Labs No  results found for this or any previous visit (from the past 2160 hour(s)).  Radiology No results found.  Assessment/Plan Benign essential HTN blood pressure control important in reducing the progression of atherosclerotic disease. On appropriate oral medications.   Chronic kidney disease, stage III (moderate) Avoid contrast  unless absolutely necessary.  Especially now with myeloma diagnosis.   Hyperlipidemia lipid control important in reducing the progression of atherosclerotic disease. Continue statin therapy  Bilateral carotid artery stenosis Duplex today show 1 to 39% stenosis by velocity criteria bilaterally with a patent right carotid endarterectomy and a patent left carotid stent.  Doing well.  Continue current medical regimen.  Continue to follow on an annual basis.  Renal artery stenosis (HCC) Duplex today shows a widely patent right renal artery stent and no hemodynamically significant left renal artery stenosis.  Blood pressure control seems stable.  No change in medications.  Recheck in 1 year.    Festus Barren, MD  10/07/2022 9:29 AM    This note was created with Dragon medical transcription system.  Any errors from dictation are purely unintentional

## 2022-10-08 ENCOUNTER — Other Ambulatory Visit: Payer: Self-pay | Admitting: *Deleted

## 2022-10-08 DIAGNOSIS — D649 Anemia, unspecified: Secondary | ICD-10-CM

## 2022-10-08 DIAGNOSIS — J439 Emphysema, unspecified: Secondary | ICD-10-CM | POA: Diagnosis not present

## 2022-10-08 DIAGNOSIS — I6523 Occlusion and stenosis of bilateral carotid arteries: Secondary | ICD-10-CM | POA: Diagnosis not present

## 2022-10-08 DIAGNOSIS — Z01818 Encounter for other preprocedural examination: Secondary | ICD-10-CM | POA: Diagnosis not present

## 2022-10-08 DIAGNOSIS — I10 Essential (primary) hypertension: Secondary | ICD-10-CM | POA: Diagnosis not present

## 2022-10-08 DIAGNOSIS — Z9889 Other specified postprocedural states: Secondary | ICD-10-CM | POA: Diagnosis not present

## 2022-10-08 DIAGNOSIS — Z8679 Personal history of other diseases of the circulatory system: Secondary | ICD-10-CM | POA: Diagnosis not present

## 2022-10-08 DIAGNOSIS — I2510 Atherosclerotic heart disease of native coronary artery without angina pectoris: Secondary | ICD-10-CM | POA: Diagnosis not present

## 2022-10-08 DIAGNOSIS — N1831 Chronic kidney disease, stage 3a: Secondary | ICD-10-CM | POA: Diagnosis not present

## 2022-10-08 DIAGNOSIS — Z951 Presence of aortocoronary bypass graft: Secondary | ICD-10-CM | POA: Diagnosis not present

## 2022-10-09 ENCOUNTER — Inpatient Hospital Stay (HOSPITAL_BASED_OUTPATIENT_CLINIC_OR_DEPARTMENT_OTHER): Payer: Medicare Other | Admitting: Internal Medicine

## 2022-10-09 ENCOUNTER — Inpatient Hospital Stay: Payer: Medicare Other | Attending: Internal Medicine

## 2022-10-09 ENCOUNTER — Encounter: Payer: Self-pay | Admitting: Internal Medicine

## 2022-10-09 VITALS — BP 103/60 | HR 74 | Temp 98.6°F | Ht 76.0 in | Wt 206.0 lb

## 2022-10-09 DIAGNOSIS — I2510 Atherosclerotic heart disease of native coronary artery without angina pectoris: Secondary | ICD-10-CM | POA: Insufficient documentation

## 2022-10-09 DIAGNOSIS — Z7901 Long term (current) use of anticoagulants: Secondary | ICD-10-CM | POA: Insufficient documentation

## 2022-10-09 DIAGNOSIS — Z87891 Personal history of nicotine dependence: Secondary | ICD-10-CM | POA: Insufficient documentation

## 2022-10-09 DIAGNOSIS — D539 Nutritional anemia, unspecified: Secondary | ICD-10-CM | POA: Insufficient documentation

## 2022-10-09 DIAGNOSIS — E7800 Pure hypercholesterolemia, unspecified: Secondary | ICD-10-CM | POA: Diagnosis not present

## 2022-10-09 DIAGNOSIS — E1122 Type 2 diabetes mellitus with diabetic chronic kidney disease: Secondary | ICD-10-CM | POA: Insufficient documentation

## 2022-10-09 DIAGNOSIS — M549 Dorsalgia, unspecified: Secondary | ICD-10-CM | POA: Diagnosis not present

## 2022-10-09 DIAGNOSIS — M25552 Pain in left hip: Secondary | ICD-10-CM | POA: Insufficient documentation

## 2022-10-09 DIAGNOSIS — K219 Gastro-esophageal reflux disease without esophagitis: Secondary | ICD-10-CM | POA: Insufficient documentation

## 2022-10-09 DIAGNOSIS — Z8673 Personal history of transient ischemic attack (TIA), and cerebral infarction without residual deficits: Secondary | ICD-10-CM | POA: Diagnosis not present

## 2022-10-09 DIAGNOSIS — N183 Chronic kidney disease, stage 3 unspecified: Secondary | ICD-10-CM

## 2022-10-09 DIAGNOSIS — I4891 Unspecified atrial fibrillation: Secondary | ICD-10-CM | POA: Insufficient documentation

## 2022-10-09 DIAGNOSIS — D46Z Other myelodysplastic syndromes: Secondary | ICD-10-CM | POA: Diagnosis not present

## 2022-10-09 DIAGNOSIS — J4489 Other specified chronic obstructive pulmonary disease: Secondary | ICD-10-CM | POA: Diagnosis not present

## 2022-10-09 DIAGNOSIS — D649 Anemia, unspecified: Secondary | ICD-10-CM

## 2022-10-09 DIAGNOSIS — I700 Atherosclerosis of aorta: Secondary | ICD-10-CM | POA: Insufficient documentation

## 2022-10-09 DIAGNOSIS — Z79899 Other long term (current) drug therapy: Secondary | ICD-10-CM | POA: Insufficient documentation

## 2022-10-09 DIAGNOSIS — D4620 Refractory anemia with excess of blasts, unspecified: Secondary | ICD-10-CM | POA: Insufficient documentation

## 2022-10-09 DIAGNOSIS — Z7951 Long term (current) use of inhaled steroids: Secondary | ICD-10-CM | POA: Insufficient documentation

## 2022-10-09 DIAGNOSIS — Z7982 Long term (current) use of aspirin: Secondary | ICD-10-CM | POA: Insufficient documentation

## 2022-10-09 DIAGNOSIS — I129 Hypertensive chronic kidney disease with stage 1 through stage 4 chronic kidney disease, or unspecified chronic kidney disease: Secondary | ICD-10-CM | POA: Diagnosis not present

## 2022-10-09 DIAGNOSIS — I252 Old myocardial infarction: Secondary | ICD-10-CM | POA: Diagnosis not present

## 2022-10-09 LAB — CBC WITH DIFFERENTIAL (CANCER CENTER ONLY)
Abs Immature Granulocytes: 0.04 10*3/uL (ref 0.00–0.07)
Basophils Absolute: 0 10*3/uL (ref 0.0–0.1)
Basophils Relative: 1 %
Eosinophils Absolute: 0.1 10*3/uL (ref 0.0–0.5)
Eosinophils Relative: 1 %
HCT: 35.1 % — ABNORMAL LOW (ref 39.0–52.0)
Hemoglobin: 11.5 g/dL — ABNORMAL LOW (ref 13.0–17.0)
Immature Granulocytes: 1 %
Lymphocytes Relative: 20 %
Lymphs Abs: 1.4 10*3/uL (ref 0.7–4.0)
MCH: 36.6 pg — ABNORMAL HIGH (ref 26.0–34.0)
MCHC: 32.8 g/dL (ref 30.0–36.0)
MCV: 111.8 fL — ABNORMAL HIGH (ref 80.0–100.0)
Monocytes Absolute: 0.5 10*3/uL (ref 0.1–1.0)
Monocytes Relative: 8 %
Neutro Abs: 4.8 10*3/uL (ref 1.7–7.7)
Neutrophils Relative %: 69 %
Platelet Count: 184 10*3/uL (ref 150–400)
RBC: 3.14 MIL/uL — ABNORMAL LOW (ref 4.22–5.81)
RDW: 15 % (ref 11.5–15.5)
WBC Count: 6.9 10*3/uL (ref 4.0–10.5)
nRBC: 0 % (ref 0.0–0.2)

## 2022-10-09 LAB — CMP (CANCER CENTER ONLY)
ALT: 21 U/L (ref 0–44)
AST: 27 U/L (ref 15–41)
Albumin: 3.7 g/dL (ref 3.5–5.0)
Alkaline Phosphatase: 54 U/L (ref 38–126)
Anion gap: 10 (ref 5–15)
BUN: 21 mg/dL (ref 8–23)
CO2: 24 mmol/L (ref 22–32)
Calcium: 9 mg/dL (ref 8.9–10.3)
Chloride: 106 mmol/L (ref 98–111)
Creatinine: 1.5 mg/dL — ABNORMAL HIGH (ref 0.61–1.24)
GFR, Estimated: 46 mL/min — ABNORMAL LOW (ref >60)
Glucose, Bld: 105 mg/dL — ABNORMAL HIGH (ref 70–99)
Potassium: 4.7 mmol/L (ref 3.5–5.1)
Sodium: 140 mmol/L (ref 135–145)
Total Bilirubin: 0.3 mg/dL (ref 0.3–1.2)
Total Protein: 7.7 g/dL (ref 6.5–8.1)

## 2022-10-09 LAB — LACTATE DEHYDROGENASE: LDH: 145 U/L (ref 98–192)

## 2022-10-09 NOTE — Assessment & Plan Note (Signed)
#   Anemia: Hemoglobin:[[name] 2023] 12 MCV-110; normal white count/platelets.   [name] 2024-B12 folic acid normal [PCP]; FEB 7th, CT scan AP-negative for hepatosplenomegaly or cirrhosis.  # MARCH 2024-low-grade MDS - bone marrow biopsy hypercellular; clonal plasma cells noted; myeloma panel FISH panel negative.  Cytogenetics normal.  Recommend foundation 1 heme-MDS panel.  # I reviewed the pathophysiology of low-grade MDS the patient and his wife.  I also again reviewed the treatment options of MDS include growth factor injections, tablets versus chemotherapy.  Pending today foundation 1 hem results.  Again given no obvious clinical symptoms from underlying MDS-I would recommend surveillance.   # MGUS: immunofixation shows IgA monoclonal protein with kappa light chain specificity.  Kappa lambda light chain ratio= 2.72.  Bone marrow biopsy- March 2024-6% plasma cells.  Again no role for any therapy at this time.  Continue surveillance every 6 months  or so labs. Stable.   # COPD /cough-defer to pulmonary/Dr.Fleming-for further recommendations. Stable.   # Chronic kidney disease stage III-[Dr.Singh]-Stable.   # Chronic back pain-unrelated to MDS/MGUS; awaiting ablation with [facility]. Stable.   # A.fib/CAD [Dr.Kowalski/[facility]] - on eliquis- stable.   # DISPOSITION: # follow up in 6 month MD; 2 weeks PRIOR- labs- cbc/cmp; LDH; MM panel; K/l light chains -  Dr.B

## 2022-10-09 NOTE — Progress Notes (Signed)
Having some dizziness.  Having back nerve  ablation tomorrow.

## 2022-10-09 NOTE — Progress Notes (Signed)
[facility] CONSULT NOTE  Patient Care Team: [name], NP as PCP - General (Nurse Practitioner)  CHIEF COMPLAINTS/PURPOSE OF CONSULTATION: ANEMIA  HEMATOLOGY HISTORY  # ANEMIA[Hb; MCV-platelets- WBC; Iron sat; ferritin;  GFR- CT/US; EGD/colonoscopy-  # Anemia: Hemoglobin:[[name] 2023] 12 MCV-110; normal white count/platelets.   [name] 2024-B12 folic acid normal [PCP]; FEB 7th, CT scan AP-negative for hepatosplenomegaly or cirrhosis.  MARCH 2024-bone marrow biopsy hypercellular; clonal plasma cells noted 6%; M-protein negative; kappa lambda light chain ratio= slightly abnormal;  myeloma panel FISH panel negative.  Cytogenetics normal.  MARCH 2024- foundation 1 heme-MDS panel.  HISTORY OF PRESENTING ILLNESS: HOH; inability because of back pain.  Accompanied by his wife.  Keith Cruz 82 y.o.  male patient with multiple medical problems including COPD/A-fib on Eliquis CKD stage III and chronic macrocytic anemia-is here to review the results  blood work.   Patient continues to have chronic/acute worsening of left back/hip pain; awaiting ablation tomorrow.   Patient denies any night sweats.  Denies any lumps or bumps.   Review of Systems  Constitutional:  Positive for malaise/fatigue and weight loss. Negative for chills and diaphoresis.  HENT:  Negative for nosebleeds and sore throat.   Eyes:  Negative for double vision.  Respiratory:  Negative for cough, hemoptysis, sputum production, shortness of breath and wheezing.   Cardiovascular:  Negative for chest pain, palpitations, orthopnea and leg swelling.  Gastrointestinal:  Negative for abdominal pain, blood in stool, constipation, diarrhea, heartburn, melena, nausea and vomiting.  Genitourinary:  Negative for dysuria, frequency and urgency.  Musculoskeletal:  Positive for back pain and joint pain.  Skin: Negative.  Negative for itching and rash.  Neurological:  Negative for dizziness, tingling, focal weakness, weakness and  headaches.  Endo/Heme/Allergies:  Does not bruise/bleed easily.  Psychiatric/Behavioral:  Negative for depression. The patient is not nervous/anxious and does not have insomnia.      MEDICAL HISTORY:  Past Medical History:  Diagnosis Date   Arthritis    back   Asthma    Atherosclerosis of aorta (HCC)    Atherosclerosis of aorta (HCC)    Bronchitis 05/2017   chronic   CAD (coronary artery disease)    Carotid artery disease (HCC)    Carotid stenosis    Carotid stenosis, bilateral    Chronic bronchitis (HCC)    Chronic cough    COPD (chronic obstructive pulmonary disease) (HCC)    Dysrhythmia    GERD (gastroesophageal reflux disease)    Heart murmur    HOH (hard of hearing)    hearing aids   Hypercholesteremia    Hypertension    Kidney disease    stent   Macular degeneration of left eye    Myocardial infarction (HCC) 2016   Pre-diabetes    Seasonal allergies    Seizure (HCC)    Seizures (HCC)    viral meningitis   Stroke [facility])    1991 and imaging 05/12/15 old strokes   TIA (transient ischemic attack) 1991   x2   Uses hearing aid    Viral meningitis 2017   Viral meningitis    Wears dentures    upper and lower    SURGICAL HISTORY: Past Surgical History:  Procedure Laterality Date   CAROTID ENDARTERECTOMY Left 2014   CAROTID ENDARTERECTOMY Right 1991   CAROTID PTA/STENT INTERVENTION Left 05/31/2020   Procedure: CAROTID PTA/STENT INTERVENTIO;  Surgeon: Annice Needy, MD;  Location: [facility] INVASIVE CV LAB;  Service: Cardiovascular;  Laterality: Left;  CATARACT EXTRACTION W/PHACO Left 06/24/2017   Procedure: CATARACT EXTRACTION PHACO AND INTRAOCULAR LENS PLACEMENT (IOC) LEFT BORDERLINE DIABETIC;  Surgeon: Lockie Mola, MD;  Location: [facility];  Service: Ophthalmology;  Laterality: Left;   CATARACT EXTRACTION W/PHACO Right 07/22/2017   Procedure: CATARACT EXTRACTION PHACO AND INTRAOCULAR LENS PLACEMENT (IOC) RIGHT BORDERLINE DIABETIC;  Surgeon:  Lockie Mola, MD;  Location: [facility];  Service: Ophthalmology;  Laterality: Right;   CORONARY ARTERY BYPASS GRAFT     x5   SKIN LESION EXCISION Left    left ear   URETERAL STENT PLACEMENT      SOCIAL HISTORY: Social History   Socioeconomic History   Marital status: Married    Spouse name: Not on file   Number of children: Not on file   Years of education: Not on file   Highest education level: 10th grade  Occupational History   Not on file  Tobacco Use   Smoking status: Former    Packs/day: 2.00    Years: 10.00    Additional pack years: 0.00    Total pack years: 20.00    Types: Cigarettes    Quit date: 04/28/1970    Years since quitting: 52.4   Smokeless tobacco: Former  Building services engineer Use: Never used  Substance and Sexual Activity   Alcohol use: No    Alcohol/week: 0.0 standard drinks of alcohol    Comment: previous   Drug use: No   Sexual activity: Yes    Comment: wife   Other Topics Concern   Not on file  Social History Narrative   Re-married x 3 years wife named Audiological scientist pets, 1 dog.    Retired    Investment banker, operational, wears seat belt, safe in relationship    2 sons and 1 daughter    6 years [employer]    Social Determinants of Health   Financial Resource Strain: Low Risk  (02/14/2022)   Overall Financial Resource Strain (CARDIA)    Difficulty of Paying Living Expenses: Not hard at all  Food Insecurity: No Food Insecurity (05/28/2022)   Hunger Vital Sign    Worried About Running Out of Food in the Last Year: Never true    Ran Out of Food in the Last Year: Never true  Transportation Needs: No Transportation Needs (05/28/2022)   PRAPARE - Administrator, Civil Service (Medical): No    Lack of Transportation (Non-Medical): No  Physical Activity: Insufficiently Active (02/14/2022)   Exercise Vital Sign    Days of Exercise per Week: 2 days    Minutes of Exercise per Session: 20 min  Stress: No Stress Concern Present  (02/14/2022)   [employer] of Occupational Health - Occupational Stress Questionnaire    Feeling of Stress : Not at all  Social Connections: Moderately Integrated (02/14/2022)   Social Connection and Isolation Panel [NHANES]    Frequency of Communication with Friends and Family: More than three times a week    Frequency of Social Gatherings with Friends and Family: More than three times a week    Attends Religious Services: More than 4 times per year    Active Member of [employer] or Organizations: No    Attends Banker Meetings: Never    Marital Status: Married  Catering manager Violence: Not At Risk (05/28/2022)   Humiliation, Afraid, Rape, and Kick questionnaire    Fear of Current or Ex-Partner: No    Emotionally Abused: No  Physically Abused: No    Sexually Abused: No    FAMILY HISTORY: Family History  Problem Relation Age of Onset   Stroke Mother    Heart attack Mother    Dementia Mother    Stroke Father    Stroke Sister    Alzheimer's disease Sister    Dementia Sister    Dementia Sister    Stroke Brother    Alzheimer's disease Maternal Grandmother    Dementia Maternal Grandmother    Stroke Paternal Grandmother    Stroke Paternal Grandfather    Alzheimer's disease Paternal Grandfather    Cancer Neg Hx     ALLERGIES:  is allergic to fexofenadine-pseudoephed er, nifedipine, pseudoephedrine, allegra  [fexofenadine], anoro ellipta [umeclidinium-vilanterol], carvedilol, doxycycline hyclate, and prednisone.  MEDICATIONS:  Current Outpatient Medications  Medication Sig Dispense Refill   Accu-Chek Softclix Lancets lancets Test blood glucose once daily 100 each 12   acetaminophen (TYLENOL) 500 MG tablet Take 500 mg by mouth every 6 (six) hours as needed.     albuterol (PROAIR HFA) 108 (90 Base) MCG/ACT inhaler INHALE 1-2 PUFFS INTO THE LUNGS EVERY 4 (FOUR) HOURS AS NEEDED FOR WHEEZING OR SHORTNESS OF BREATH. 18 each 12   apixaban (ELIQUIS) 5 MG TABS tablet  TAKE 1 TABLET BY MOUTH TWICE DAILY 60 tablet 5   aspirin EC 81 MG tablet Take 81 mg by mouth daily.     atorvastatin (LIPITOR) 80 MG tablet Take 1 tablet (80 mg total) by mouth daily. 90 tablet 3   blood glucose meter kit and supplies Dispense based on patient and insurance preference. Use as directed to check blood glucose once daily. 1 each 0   Cholecalciferol (VITAMIN D3) 2000 UNITS capsule Take 2,000 Units by mouth daily. am     cyclobenzaprine (FLEXERIL) 5 MG tablet 5 mg 3 (three) times daily as needed. (Patient not taking: Reported on 07/09/2022)     docusate sodium (COLACE) 50 MG capsule Take 50 mg by mouth daily as needed.     fexofenadine (ALLEGRA) 180 MG tablet Take 1 tablet (180 mg total) by mouth daily as needed for allergies or rhinitis. 90 tablet 3   fluticasone (FLOVENT HFA) 220 MCG/ACT inhaler Inhale 2 puffs into the lungs 2 (two) times daily. Rinse your mouth 12 each 11   folic acid (FOLVITE) 1 MG tablet Take 0.8 mg by mouth daily. am     glucose blood test strip Test blood glucose once daily 100 each 12   hydrALAZINE (APRESOLINE) 50 MG tablet Take 1 tablet (50 mg total) by mouth 3 (three) times daily. 270 tablet 3   lidocaine (LIDODERM) 5 %      metoprolol tartrate (LOPRESSOR) 50 MG tablet Take 1 tablet (50 mg total) by mouth 2 (two) times daily. D/c coreg pt prefers metoprolol 180 tablet 3   Multiple Vitamins-Minerals (CENTRUM SILVER 50+MEN PO) Take by mouth.     pantoprazole (PROTONIX) 40 MG tablet Take 1 tablet (40 mg total) by mouth 2 (two) times daily before a meal. TRY TO TAKE ONLY 1X PER DAY INSTEAD OF 2 30 MIN BEFORE A MEAL 180 tablet 3   tamsulosin (FLOMAX) 0.4 MG CAPS capsule Take 1 capsule by mouth daily.     tiZANidine HCl (ZANAFLEX PO) Take 1 tablet by mouth at bedtime as needed (muscle spasms). (Patient not taking: Reported on 07/09/2022)     triamcinolone cream (KENALOG) 0.1 % APPLY TO AFFECTED AREA TWICE A DAY 30 g 0   Current Facility-Administered Medications  Medication Dose Route Frequency Provider Last Rate Last Admin   ipratropium-albuterol (DUONEB) 0.5-2.5 (3) MG/3ML nebulizer solution 3 mL  3 mL Nebulization Once Karamalegos, Alexander J, DO         .  PHYSICAL EXAMINATION:   Vitals:   10/09/22 1027  BP: 103/60  Pulse: 74  Temp: 98.6 F (37 C)  SpO2: 98%   Filed Weights   10/09/22 1027  Weight: 206 lb (93.4 kg)    Physical Exam Vitals and nursing note reviewed.  HENT:     Head: Normocephalic and atraumatic.     Mouth/Throat:     Pharynx: Oropharynx is clear.  Eyes:     Extraocular Movements: Extraocular movements intact.     Pupils: Pupils are equal, round, and reactive to light.  Cardiovascular:     Rate and Rhythm: Normal rate and regular rhythm.  Pulmonary:     Comments: Decreased breath sounds bilaterally.  Abdominal:     Palpations: Abdomen is soft.  Musculoskeletal:        General: Normal range of motion.     Cervical back: Normal range of motion.  Skin:    General: Skin is warm.  Neurological:     General: No focal deficit present.     Mental Status: He is alert and oriented to person, place, and time.  Psychiatric:        Behavior: Behavior normal.        Judgment: Judgment normal.      LABORATORY DATA:  I have reviewed the data as listed Lab Results  Component Value Date   WBC 6.9 10/09/2022   HGB 11.5 (L) 10/09/2022   HCT 35.1 (L) 10/09/2022   MCV 111.8 (H) 10/09/2022   PLT 184 10/09/2022   Recent Labs    05/13/22 1324 05/28/22 1221 10/09/22 1026  NA 140 137 140  K 4.6 4.3 4.7  CL 104 102 106  CO2 28 24 24   GLUCOSE 142* 129* 105*  BUN 26* 24* 21  CREATININE 1.61* 1.35* 1.50*  CALCIUM 9.2 8.9 9.0  GFRNONAA  --  53* 46*  PROT 7.1 7.7 7.7  ALBUMIN 4.2 3.7 3.7  AST 25 30 27   ALT 19 22 21   ALKPHOS 45 50 54  BILITOT 0.4 0.3 0.3     VAS US CAROTID  Result Date: 10/09/2022 Carotid Arterial Duplex Study Patient Name:  ROGERIO ANGLEY  Date of Exam:   10/07/2022 Medical Rec #:  [id number]       Accession #:    [id number] Date of Birth: 02/09/1941        Patient Gender: M Patient Age:   83 years Exam Location:  [facility] Procedure:      VAS US CAROTID Referring Phys: Festus Barren --------------------------------------------------------------------------------  Other Factors:     05/2020 new Lt ICA stent                    H/O bilateral carotid endarterectomies;                    S/P redo right carotid endarterectomy at [facility] in 2021;. Comparison Study:  10/08/2021 Performing Technologist: Salvadore Farber RVT  Examination Guidelines: A complete evaluation includes B-mode imaging, spectral Doppler, color Doppler, and power Doppler as needed of all accessible portions of each vessel. Bilateral testing is considered an integral part of a complete examination. Limited examinations for reoccurring indications may be performed as noted.  Right Carotid Findings: +----------+--------+--------+--------+------------------+--------+  PSV cm/sEDV cm/sStenosisPlaque DescriptionComments +----------+--------+--------+--------+------------------+--------+ CCA Prox  99      12                                         +----------+--------+--------+--------+------------------+--------+ CCA Mid   68      14                                         +----------+--------+--------+--------+------------------+--------+ CCA Distal76      12                                         +----------+--------+--------+--------+------------------+--------+ ICA Prox  68      15                                         +----------+--------+--------+--------+------------------+--------+ ICA Mid   95      21      1-39%                              +----------+--------+--------+--------+------------------+--------+ ICA Distal58      14                                         +----------+--------+--------+--------+------------------+--------+ ECA       322     3        >50%                               +----------+--------+--------+--------+------------------+--------+ +----------+--------+-------+---------+-------------------+           PSV cm/sEDV cmsDescribe Arm Pressure (mmHG) +----------+--------+-------+---------+-------------------+ Subclavian185            Turbulent                    +----------+--------+-------+---------+-------------------+ +---------+--------+--+--------+--+---------+ VertebralPSV cm/s85EDV cm/s18Antegrade +---------+--------+--+--------+--+---------+  Left Carotid Findings: +----------+--------+--------+--------+------------------+--------+           PSV cm/sEDV cm/sStenosisPlaque DescriptionComments +----------+--------+--------+--------+------------------+--------+ CCA Prox  49      10                                         +----------+--------+--------+--------+------------------+--------+ CCA Mid   80      19                                         +----------+--------+--------+--------+------------------+--------+ CCA Distal75      20                                         +----------+--------+--------+--------+------------------+--------+ ICA Prox  52      14                                         +----------+--------+--------+--------+------------------+--------+  ICA Mid   81      17      1-39%                     stent    +----------+--------+--------+--------+------------------+--------+ ICA Distal54      15                                         +----------+--------+--------+--------+------------------+--------+ ECA       86      1                                          +----------+--------+--------+--------+------------------+--------+ +----------+--------+--------+----------------+-------------------+           PSV cm/sEDV cm/sDescribe        Arm Pressure (mmHG) +----------+--------+--------+----------------+-------------------+ [id number]               Multiphasic, WNL                    +----------+--------+--------+----------------+-------------------+ +---------+--------+--+--------+--+---------+ VertebralPSV cm/s69EDV cm/s16Antegrade +---------+--------+--+--------+--+---------+   Summary: Right Carotid: Velocities in the right ICA are consistent with a 1-39% stenosis.                Non-hemodynamically significant plaque <50% noted in the CCA. The                ECA appears >50% stenosed. Widely patent ICA s/p CEA. Left Carotid: The ECA appears <50% stenosed. Patent ICA with no evidence of               significant stenosis. Vertebrals:  Bilateral vertebral arteries demonstrate antegrade flow. Subclavians: Right subclavian artery flow was disturbed. Normal flow              hemodynamics were seen in the left subclavian artery. *See table(s) above for measurements and observations.  Electronically signed by Festus Barren MD on 10/09/2022 at 8:44:44 AM.    Final    VAS US RENAL ARTERY DUPLEX  Result Date: 10/09/2022 ABDOMINAL VISCERAL Patient Name:  PRECIOUS DERMAN  Date of Exam:   10/07/2022 Medical Rec #: [id number]       Accession #:    [id number] Date of Birth: 01-16-1941        Patient Gender: M Patient Age:   85 years Exam Location:  [facility] Procedure:      VAS US RENAL ARTERY DUPLEX Referring Phys: [id number] Marlow Baars DEW -------------------------------------------------------------------------------- Indications: Right renal artery stent 2011              CKD High Risk Factors: Hypertension. Performing Technologist: Salvadore Farber RVT  Examination Guidelines: A complete evaluation includes B-mode imaging, spectral Doppler, color Doppler, and power Doppler as needed of all accessible portions of each vessel. Bilateral testing is considered an integral part of a complete examination. Limited examinations for reoccurring indications may be performed as noted.  Duplex Findings: +----------+--------+--------+------+--------+  MesentericPSV cm/sEDV cm/sPlaqueComments +----------+--------+--------+------+--------+ Aorta Mid   103                          +----------+--------+--------+------+--------+    +------------------+--------+--------+-------+ Right Renal ArteryPSV cm/sEDV cm/sComment +------------------+--------+--------+-------+ Proximal             62                   +------------------+--------+--------+-------+  Mid                  75                   +------------------+--------+--------+-------+ Distal               52                   +------------------+--------+--------+-------+ +-----------------+--------+--------+-------+ Left Renal ArteryPSV cm/sEDV cm/sComment +-----------------+--------+--------+-------+ Proximal            45                   +-----------------+--------+--------+-------+ Mid                 43                   +-----------------+--------+--------+-------+ Distal              20                   +-----------------+--------+--------+-------+ +------------+--------+--------+----+-----------+--------+--------+----+ Right KidneyPSV cm/sEDV cm/sRI  Left KidneyPSV cm/sEDV cm/sRI   +------------+--------+--------+----+-----------+--------+--------+----+ Upper Pole                      Upper Pole                      +------------+--------+--------+----+-----------+--------+--------+----+ Mid         21      6       0.        30      9       0.70 +------------+--------+--------+----+-----------+--------+--------+----+ Lower Pole                      Lower Pole                      +------------+--------+--------+----+-----------+--------+--------+----+ Hilar                           Hilar                           +------------+--------+--------+----+-----------+--------+--------+----+ +------------------+-----+------------------+-----+ Right Kidney           Left Kidney              +------------------+-----+------------------+-----+ RAR                    RAR                     +------------------+-----+------------------+-----+ RAR (manual)      .73  RAR (manual)      .44   +------------------+-----+------------------+-----+ Cortex                 Cortex                  +------------------+-----+------------------+-----+ Cortex thickness       Corex thickness         +------------------+-----+------------------+-----+ Kidney length (cm)10.39Kidney length (cm)10.72 +------------------+-----+------------------+-----+  Summary: Renal:  Right: Normal size right kidney. Normal right Resisitive Index.        Normal cortical thickness of right kidney. 1-59% stenosis of        the right renal artery. RRV flow present. Evidence of right        RA stent patent. Left:  Normal size of left kidney. Normal left Resistive Index.        Normal cortical thickness of the left kidney. 1-59% stenosis        of the left renal artery. LRV flow present.  *See table(s) above for measurements and observations.  Diagnosing physician: Festus Barren MD  Electronically signed by Festus Barren MD on 10/09/2022 at 8:44:21 AM.    Final     ASSESSMENT & PLAN:   Symptomatic anemia    MDS (myelodysplastic syndrome), low grade (HCC) # Anemia: Hemoglobin:[[name] 2023] 12 MCV-110; normal white count/platelets.   [name] 2024-B12 folic acid normal [PCP]; FEB 7th, CT scan AP-negative for hepatosplenomegaly or cirrhosis.  # MARCH 2024-low-grade MDS - bone marrow biopsy hypercellular; clonal plasma cells noted; myeloma panel FISH panel negative.  Cytogenetics normal.  Recommend foundation 1 heme-MDS panel.  # I reviewed the pathophysiology of low-grade MDS the patient and his wife.  I also again reviewed the treatment options of MDS include growth factor injections, tablets versus chemotherapy.  Pending today foundation 1 hem results.  Again given no obvious clinical symptoms from underlying MDS-I would  recommend surveillance.   # MGUS: immunofixation shows IgA monoclonal protein with kappa light chain specificity.  Kappa lambda light chain ratio= 2.72.  Bone marrow biopsy- March 2024-6% plasma cells.  Again no role for any therapy at this time.  Continue surveillance every 6 months  or so labs. Stable.   # COPD /cough-defer to pulmonary/Dr.Fleming-for further recommendations. Stable.   # Chronic kidney disease stage III-[Dr.Singh]-Stable.   # Chronic back pain-unrelated to MDS/MGUS; awaiting ablation with [facility]. Stable.   # A.fib/CAD [Dr.Kowalski/[facility]] - on eliquis- stable.   # DISPOSITION: # follow up in 6 month MD; 2 weeks PRIOR- labs- cbc/cmp; LDH; MM panel; K/l light chains -  Dr.B   All questions were answered. The patient knows to call the clinic with any problems, questions or concerns.   Earna Coder, MD 10/09/2022 11:37 AM

## 2022-10-10 DIAGNOSIS — M5451 Vertebrogenic low back pain: Secondary | ICD-10-CM | POA: Diagnosis not present

## 2022-10-14 ENCOUNTER — Telehealth: Payer: Self-pay

## 2022-10-14 DIAGNOSIS — R7303 Prediabetes: Secondary | ICD-10-CM

## 2022-10-14 MED ORDER — GLUCOSE BLOOD VI STRP: ORAL_STRIP | 12 refills | Status: DC

## 2022-10-14 MED ORDER — ACCU-CHEK SOFTCLIX LANCETS MISC: 12 refills | Status: DC

## 2022-10-14 MED ORDER — BLOOD GLUCOSE METER KIT: PACK | 0 refills | Status: DC

## 2022-10-14 MED ORDER — BLOOD GLUCOSE METER KIT: PACK | 0 refills | Status: AC

## 2022-10-14 NOTE — Addendum Note (Signed)
Addended by: Donavan Foil on: 10/14/2022 01:37 PM   Modules accepted: Orders

## 2022-10-14 NOTE — Telephone Encounter (Signed)
Due to [employer] rejecting order being sent electronically. [name], NP has signed the printed order and it has been faxed to [facility] in [place] at [phone] with a completed transmission log

## 2022-10-14 NOTE — Telephone Encounter (Signed)
Called and pts wife answered to inform pt that we received a recall on the accu check guide BGM that he has, he came back as a  flagged member that may have received that machine.    A new machine will be sent in for pt and pts wife has been advised to have him stop using that machine as numbers could be lower than normal on that machine.

## 2022-11-12 ENCOUNTER — Ambulatory Visit: Payer: Medicare Other | Admitting: Nurse Practitioner

## 2022-11-12 ENCOUNTER — Encounter: Payer: Self-pay | Admitting: Nurse Practitioner

## 2022-11-12 ENCOUNTER — Ambulatory Visit: Payer: Medicare Other | Admitting: Dermatology

## 2022-11-12 ENCOUNTER — Ambulatory Visit (INDEPENDENT_AMBULATORY_CARE_PROVIDER_SITE_OTHER): Payer: Medicare Other | Admitting: Nurse Practitioner

## 2022-11-12 VITALS — BP 116/60 | HR 57 | Temp 97.9°F | Ht 73.0 in | Wt 209.2 lb

## 2022-11-12 DIAGNOSIS — I10 Essential (primary) hypertension: Secondary | ICD-10-CM

## 2022-11-12 DIAGNOSIS — I480 Paroxysmal atrial fibrillation: Secondary | ICD-10-CM

## 2022-11-12 DIAGNOSIS — E785 Hyperlipidemia, unspecified: Secondary | ICD-10-CM | POA: Diagnosis not present

## 2022-11-12 DIAGNOSIS — D46Z Other myelodysplastic syndromes: Secondary | ICD-10-CM | POA: Diagnosis not present

## 2022-11-12 DIAGNOSIS — Z1329 Encounter for screening for other suspected endocrine disorder: Secondary | ICD-10-CM

## 2022-11-12 DIAGNOSIS — J432 Centrilobular emphysema: Secondary | ICD-10-CM

## 2022-11-12 DIAGNOSIS — R7303 Prediabetes: Secondary | ICD-10-CM | POA: Diagnosis not present

## 2022-11-12 DIAGNOSIS — N1831 Chronic kidney disease, stage 3a: Secondary | ICD-10-CM | POA: Diagnosis not present

## 2022-11-12 LAB — CBC WITH DIFFERENTIAL/PLATELET
Basophils Absolute: 0 10*3/uL (ref 0.0–0.1)
Basophils Relative: 0.6 % (ref 0.0–3.0)
Eosinophils Absolute: 0.1 10*3/uL (ref 0.0–0.7)
Eosinophils Relative: 1.3 % (ref 0.0–5.0)
HCT: 35 % — ABNORMAL LOW (ref 39.0–52.0)
Hemoglobin: 11.5 g/dL — ABNORMAL LOW (ref 13.0–17.0)
Lymphocytes Relative: 15 % (ref 12.0–46.0)
Lymphs Abs: 1.1 10*3/uL (ref 0.7–4.0)
MCHC: 32.7 g/dL (ref 30.0–36.0)
MCV: 113 fl — ABNORMAL HIGH (ref 78.0–100.0)
Monocytes Absolute: 0.6 10*3/uL (ref 0.1–1.0)
Monocytes Relative: 7.8 % (ref 3.0–12.0)
Neutro Abs: 5.6 10*3/uL (ref 1.4–7.7)
Neutrophils Relative %: 75.3 % (ref 43.0–77.0)
Platelets: 189 10*3/uL (ref 150.0–400.0)
RBC: 3.1 Mil/uL — ABNORMAL LOW (ref 4.22–5.81)
RDW: 15.4 % (ref 11.5–15.5)
WBC: 7.5 10*3/uL (ref 4.0–10.5)

## 2022-11-12 LAB — COMPREHENSIVE METABOLIC PANEL
ALT: 17 U/L (ref 0–53)
AST: 23 U/L (ref 0–37)
Albumin: 4.1 g/dL (ref 3.5–5.2)
Alkaline Phosphatase: 51 U/L (ref 39–117)
BUN: 19 mg/dL (ref 6–23)
CO2: 29 mEq/L (ref 19–32)
Calcium: 9.5 mg/dL (ref 8.4–10.5)
Chloride: 104 mEq/L (ref 96–112)
Creatinine, Ser: 1.37 mg/dL (ref 0.40–1.50)
GFR: 48.06 mL/min — ABNORMAL LOW (ref >60.00)
Glucose, Bld: 114 mg/dL — ABNORMAL HIGH (ref 70–99)
Potassium: 4.6 mEq/L (ref 3.5–5.1)
Sodium: 140 mEq/L (ref 135–145)
Total Bilirubin: 0.6 mg/dL (ref 0.2–1.2)
Total Protein: 7.4 g/dL (ref 6.0–8.3)

## 2022-11-12 LAB — TSH: TSH: 2.74 u[IU]/mL (ref 0.35–5.50)

## 2022-11-12 LAB — LIPID PANEL
Cholesterol: 89 mg/dL (ref 0–200)
HDL: 38 mg/dL — ABNORMAL LOW (ref >39.00)
LDL Cholesterol: 37 mg/dL (ref 0–99)
NonHDL: 51.47
Total CHOL/HDL Ratio: 2
Triglycerides: 71 mg/dL (ref 0.0–149.0)
VLDL: 14.2 mg/dL (ref 0.0–40.0)

## 2022-11-12 LAB — HEMOGLOBIN A1C: Hgb A1c MFr Bld: 5.9 % (ref 4.6–6.5)

## 2022-11-12 NOTE — Progress Notes (Signed)
Keith Dicker, NP-C Phone: [phone]  Keith Cruz is a 82 y.o. male who presents today for follow up. He has no complaints or new concerns today. He is doing well on all of his medications. He is followed by multiple specialities.   HYPERTENSION Disease Monitoring: Blood pressure range- 120s/70s Chest pain- No      Dyspnea- No Medications: Compliance- Lopressor and Hydralazine Lightheadedness- Yes, chronic issue   Edema- No  Lab Results  Component Value Date   NA 140 11/12/2022   K 4.6 11/12/2022   CO2 29 11/12/2022   GLUCOSE 114 (H) 11/12/2022   BUN 19 11/12/2022   CREATININE 1.37 11/12/2022   CALCIUM 9.5 11/12/2022   GFRNONAA 46 (L) 10/09/2022   PREDIABETES Disease Monitoring: Blood Sugar ranges- Not checking Polyuria/phagia/dipsia- No      Optho- Yes Medications: Compliance- Diet controlled Hypoglycemic symptoms- No  Lab Results  Component Value Date   HGBA1C 5.9 11/12/2022     HYPERLIPIDEMIA Disease Monitoring: See symptoms for Hypertension Medications: Compliance- Lipitor Right upper quadrant pain- No  Muscle aches- No  Lab Results  Component Value Date   CHOL 89 11/12/2022   HDL 38.00 (L) 11/12/2022   LDLCALC 37 11/12/2022   TRIG 71.0 11/12/2022   CHOLHDL 2 11/12/2022    COPD: Medication compliance- Arnuity daily  Rescue inhaler use- Using approx. Once daily Dyspnea- No  Wheezing- No  Cough- Yes  Productive- Yes   Social History   Tobacco Use  Smoking Status Former   Current packs/day: 0.00   Average packs/day: 2.0 packs/day for 10.0 years (20.0 ttl pk-yrs)   Types: Cigarettes   Start date: 04/28/1960   Quit date: 04/28/1970   Years since quitting: 52.5  Smokeless Tobacco Former    Current Outpatient Medications on File Prior to Visit  Medication Sig Dispense Refill   ARNUITY ELLIPTA 100 MCG/ACT AEPB Inhale 1 puff into the lungs daily.     Accu-Chek Softclix Lancets lancets Test blood glucose once daily 100 each 12   acetaminophen (TYLENOL)  500 MG tablet Take 500 mg by mouth every 6 (six) hours as needed.     albuterol (PROAIR HFA) 108 (90 Base) MCG/ACT inhaler INHALE 1-2 PUFFS INTO THE LUNGS EVERY 4 (FOUR) HOURS AS NEEDED FOR WHEEZING OR SHORTNESS OF BREATH. 18 each 12   apixaban (ELIQUIS) 5 MG TABS tablet TAKE 1 TABLET BY MOUTH TWICE DAILY 60 tablet 5   aspirin EC 81 MG tablet Take 81 mg by mouth daily.     atorvastatin (LIPITOR) 80 MG tablet Take 1 tablet (80 mg total) by mouth daily. 90 tablet 3   blood glucose meter kit and supplies Dispense based on patient and insurance preference. Use as directed to check blood glucose once daily. 1 each 0   Cholecalciferol (VITAMIN D3) 2000 UNITS capsule Take 2,000 Units by mouth daily. am     fexofenadine (ALLEGRA) 180 MG tablet Take 1 tablet (180 mg total) by mouth daily as needed for allergies or rhinitis. 90 tablet 3   folic acid (FOLVITE) 1 MG tablet Take 0.8 mg by mouth daily. am     glucose blood test strip Test blood glucose once daily 100 each 12   hydrALAZINE (APRESOLINE) 50 MG tablet Take 1 tablet (50 mg total) by mouth 3 (three) times daily. 270 tablet 3   metoprolol tartrate (LOPRESSOR) 50 MG tablet Take 1 tablet (50 mg total) by mouth 2 (two) times daily. D/c coreg pt prefers metoprolol 180 tablet 3  Multiple Vitamins-Minerals (CENTRUM SILVER 50+MEN PO) Take by mouth.     pantoprazole (PROTONIX) 40 MG tablet Take 1 tablet (40 mg total) by mouth 2 (two) times daily before a meal. TRY TO TAKE ONLY 1X PER DAY INSTEAD OF 2 30 MIN BEFORE A MEAL 180 tablet 3   tamsulosin (FLOMAX) 0.4 MG CAPS capsule Take 1 capsule by mouth daily.     tiZANidine HCl (ZANAFLEX PO) Take 1 tablet by mouth at bedtime as needed (muscle spasms). (Patient not taking: Reported on 07/09/2022)     triamcinolone cream (KENALOG) 0.1 % APPLY TO AFFECTED AREA TWICE A DAY 30 g 0   Current Facility-Administered Medications on File Prior to Visit  Medication Dose Route Frequency Provider Last Rate Last Admin    ipratropium-albuterol (DUONEB) 0.5-2.5 (3) MG/3ML nebulizer solution 3 mL  3 mL Nebulization Once Smitty Cords, DO        ROS see history of present illness  Objective  Physical Exam Vitals:   11/12/22 0851  BP: 116/60  Pulse: (!) 57  Temp: 97.9 F (36.6 C)  SpO2: 96%    BP Readings from Last 3 Encounters:  11/12/22 116/60  10/09/22 103/60  10/07/22 124/65   Wt Readings from Last 3 Encounters:  11/12/22 209 lb 3.2 oz (94.9 kg)  10/09/22 206 lb (93.4 kg)  10/07/22 208 lb 9.6 oz (94.6 kg)    Physical Exam Constitutional:      General: He is not in acute distress.    Appearance: Normal appearance.  HENT:     Head: Normocephalic.  Cardiovascular:     Rate and Rhythm: Normal rate and regular rhythm.     Heart sounds: Normal heart sounds.  Pulmonary:     Effort: Pulmonary effort is normal.     Breath sounds: Normal breath sounds. No wheezing or rhonchi.  Skin:    General: Skin is warm and dry.  Neurological:     General: No focal deficit present.     Mental Status: He is alert.  Psychiatric:        Mood and Affect: Mood normal.        Behavior: Behavior normal.    Assessment/Plan: Please see individual problem list.  Essential hypertension Assessment & Plan: Chronic. Stable on Lopressor 50 mg BID and Hydralazine 50 mg TID. Continue. Follow up with Cardiology.    Prediabetes Assessment & Plan: Stable with diet control. Last A1c- 6.1. Will check A1c today. Encouraged healthy diet and exercise.   Orders: -     Hemoglobin A1c  Hyperlipidemia, unspecified hyperlipidemia type Assessment & Plan: Chronic. Stable on Lipitor 80 mg daily. Continue. Will check lipids today.  Orders: -     Lipid panel  MDS (myelodysplastic syndrome), low grade (HCC) Assessment & Plan: Managed by Hematology/Oncology. Had bone marrow biopsy in February. Current plan of surveillance for 6 months then will repeat lab work. Follow up as scheduled.   Orders: -     CBC  with Differential/Platelet  Stage 3a chronic kidney disease (HCC) Assessment & Plan: Will check CMP today. Follow up with Nephrology.   Orders: -     Comprehensive metabolic panel  Centrilobular emphysema (HCC) Assessment & Plan: Stable on Arnuity daily and Albuterol PRN. Continue. Follow up with Pulmonology as scheduled.    Paroxysmal atrial fibrillation (HCC) Assessment & Plan: Stable on Eliquis 5 mg BID. Continue. Follow up with Cardiology and Vascular as scheduled.    Thyroid disorder screen -     TSH  Return in about 6 months (around 05/15/2023) for Follow up.   Keith Dicker, NP-C [facility] - [employer]

## 2022-11-13 ENCOUNTER — Encounter: Payer: Self-pay | Admitting: Nurse Practitioner

## 2022-11-13 NOTE — Assessment & Plan Note (Signed)
Stable with diet control. Last A1c- 6.1. Will check A1c today. Encouraged healthy diet and exercise.

## 2022-11-13 NOTE — Assessment & Plan Note (Signed)
Will check CMP today. Follow up with Nephrology.

## 2022-11-13 NOTE — Assessment & Plan Note (Signed)
Stable on Eliquis 5 mg BID. Continue. Follow up with Cardiology and Vascular as scheduled.

## 2022-11-13 NOTE — Assessment & Plan Note (Signed)
Chronic. Stable on Lipitor 80 mg daily. Continue. Will check lipids today.

## 2022-11-13 NOTE — Assessment & Plan Note (Signed)
Stable on Arnuity daily and Albuterol PRN. Continue. Follow up with Pulmonology as scheduled.

## 2022-11-13 NOTE — Assessment & Plan Note (Addendum)
Managed by Hematology/Oncology. Had bone marrow biopsy in February. Current plan of surveillance for 6 months then will repeat lab work. Follow up as scheduled.

## 2022-11-13 NOTE — Assessment & Plan Note (Signed)
Chronic. Stable on Lopressor 50 mg BID and Hydralazine 50 mg TID. Continue. Follow up with Cardiology.

## 2022-11-25 DIAGNOSIS — N1831 Chronic kidney disease, stage 3a: Secondary | ICD-10-CM | POA: Diagnosis not present

## 2022-11-25 DIAGNOSIS — I129 Hypertensive chronic kidney disease with stage 1 through stage 4 chronic kidney disease, or unspecified chronic kidney disease: Secondary | ICD-10-CM | POA: Diagnosis not present

## 2022-11-25 DIAGNOSIS — D631 Anemia in chronic kidney disease: Secondary | ICD-10-CM | POA: Diagnosis not present

## 2022-12-01 ENCOUNTER — Ambulatory Visit: Payer: Medicare Other | Admitting: Dermatology

## 2022-12-02 ENCOUNTER — Ambulatory Visit: Payer: Medicare Other | Admitting: Dermatology

## 2022-12-09 ENCOUNTER — Other Ambulatory Visit: Payer: Self-pay

## 2022-12-09 ENCOUNTER — Observation Stay: Payer: Medicare Other

## 2022-12-09 ENCOUNTER — Observation Stay
Admission: EM | Admit: 2022-12-09 | Discharge: 2022-12-10 | Disposition: A | Discharge status: Home Health | Payer: Medicare Other | Attending: Student in an Organized Health Care Education/Training Program | Admitting: Student in an Organized Health Care Education/Training Program

## 2022-12-09 ENCOUNTER — Emergency Department: Payer: Medicare Other

## 2022-12-09 DIAGNOSIS — R55 Syncope and collapse: Secondary | ICD-10-CM | POA: Diagnosis not present

## 2022-12-09 DIAGNOSIS — N1831 Chronic kidney disease, stage 3a: Secondary | ICD-10-CM | POA: Diagnosis not present

## 2022-12-09 DIAGNOSIS — D509 Iron deficiency anemia, unspecified: Secondary | ICD-10-CM | POA: Diagnosis not present

## 2022-12-09 DIAGNOSIS — I4891 Unspecified atrial fibrillation: Secondary | ICD-10-CM | POA: Diagnosis present

## 2022-12-09 DIAGNOSIS — Z951 Presence of aortocoronary bypass graft: Secondary | ICD-10-CM | POA: Diagnosis not present

## 2022-12-09 DIAGNOSIS — J441 Chronic obstructive pulmonary disease with (acute) exacerbation: Secondary | ICD-10-CM | POA: Diagnosis not present

## 2022-12-09 DIAGNOSIS — R531 Weakness: Secondary | ICD-10-CM

## 2022-12-09 DIAGNOSIS — I2510 Atherosclerotic heart disease of native coronary artery without angina pectoris: Secondary | ICD-10-CM | POA: Insufficient documentation

## 2022-12-09 DIAGNOSIS — Z7901 Long term (current) use of anticoagulants: Secondary | ICD-10-CM | POA: Diagnosis not present

## 2022-12-09 DIAGNOSIS — I480 Paroxysmal atrial fibrillation: Secondary | ICD-10-CM | POA: Diagnosis not present

## 2022-12-09 DIAGNOSIS — Z87891 Personal history of nicotine dependence: Secondary | ICD-10-CM | POA: Diagnosis not present

## 2022-12-09 DIAGNOSIS — I129 Hypertensive chronic kidney disease with stage 1 through stage 4 chronic kidney disease, or unspecified chronic kidney disease: Secondary | ICD-10-CM | POA: Diagnosis not present

## 2022-12-09 DIAGNOSIS — Z1152 Encounter for screening for COVID-19: Secondary | ICD-10-CM | POA: Diagnosis not present

## 2022-12-09 DIAGNOSIS — R6889 Other general symptoms and signs: Secondary | ICD-10-CM | POA: Diagnosis not present

## 2022-12-09 DIAGNOSIS — Z8673 Personal history of transient ischemic attack (TIA), and cerebral infarction without residual deficits: Secondary | ICD-10-CM | POA: Diagnosis not present

## 2022-12-09 DIAGNOSIS — I447 Left bundle-branch block, unspecified: Secondary | ICD-10-CM | POA: Diagnosis not present

## 2022-12-09 DIAGNOSIS — R29818 Other symptoms and signs involving the nervous system: Secondary | ICD-10-CM | POA: Diagnosis not present

## 2022-12-09 DIAGNOSIS — Z7982 Long term (current) use of aspirin: Secondary | ICD-10-CM | POA: Insufficient documentation

## 2022-12-09 DIAGNOSIS — I6782 Cerebral ischemia: Secondary | ICD-10-CM | POA: Diagnosis not present

## 2022-12-09 DIAGNOSIS — R1310 Dysphagia, unspecified: Secondary | ICD-10-CM | POA: Diagnosis not present

## 2022-12-09 DIAGNOSIS — I6529 Occlusion and stenosis of unspecified carotid artery: Secondary | ICD-10-CM | POA: Diagnosis present

## 2022-12-09 LAB — CBC WITH DIFFERENTIAL/PLATELET
Abs Immature Granulocytes: 0.05 10*3/uL (ref 0.00–0.07)
Basophils Absolute: 0.1 10*3/uL (ref 0.0–0.1)
Basophils Relative: 1 %
Eosinophils Absolute: 0.1 10*3/uL (ref 0.0–0.5)
Eosinophils Relative: 1 %
HCT: 36.7 % — ABNORMAL LOW (ref 39.0–52.0)
Hemoglobin: 11.9 g/dL — ABNORMAL LOW (ref 13.0–17.0)
Immature Granulocytes: 1 %
Lymphocytes Relative: 20 %
Lymphs Abs: 1.4 10*3/uL (ref 0.7–4.0)
MCH: 36.6 pg — ABNORMAL HIGH (ref 26.0–34.0)
MCHC: 32.4 g/dL (ref 30.0–36.0)
MCV: 112.9 fL — ABNORMAL HIGH (ref 80.0–100.0)
Monocytes Absolute: 0.5 10*3/uL (ref 0.1–1.0)
Monocytes Relative: 7 %
Neutro Abs: 5 10*3/uL (ref 1.7–7.7)
Neutrophils Relative %: 70 %
Platelets: 176 10*3/uL (ref 150–400)
RBC: 3.25 MIL/uL — ABNORMAL LOW (ref 4.22–5.81)
RDW: 15.1 % (ref 11.5–15.5)
Smear Review: NORMAL
WBC: 7 10*3/uL (ref 4.0–10.5)
nRBC: 0 % (ref 0.0–0.2)

## 2022-12-09 LAB — URINALYSIS, ROUTINE W REFLEX MICROSCOPIC
Bilirubin Urine: NEGATIVE
Glucose, UA: NEGATIVE mg/dL
Hgb urine dipstick: NEGATIVE
Ketones, ur: NEGATIVE mg/dL
Leukocytes,Ua: NEGATIVE
Nitrite: NEGATIVE
Protein, ur: NEGATIVE mg/dL
Specific Gravity, Urine: 1.006 (ref 1.005–1.030)
pH: 5 (ref 5.0–8.0)

## 2022-12-09 LAB — COMPREHENSIVE METABOLIC PANEL
ALT: 20 U/L (ref 0–44)
AST: 29 U/L (ref 15–41)
Albumin: 4.2 g/dL (ref 3.5–5.0)
Alkaline Phosphatase: 49 U/L (ref 38–126)
Anion gap: 9 (ref 5–15)
BUN: 22 mg/dL (ref 8–23)
CO2: 22 mmol/L (ref 22–32)
Calcium: 8.9 mg/dL (ref 8.9–10.3)
Chloride: 105 mmol/L (ref 98–111)
Creatinine, Ser: 1.27 mg/dL — ABNORMAL HIGH (ref 0.61–1.24)
GFR, Estimated: 56 mL/min — ABNORMAL LOW (ref >60)
Glucose, Bld: 136 mg/dL — ABNORMAL HIGH (ref 70–99)
Potassium: 3.9 mmol/L (ref 3.5–5.1)
Sodium: 136 mmol/L (ref 135–145)
Total Bilirubin: 0.7 mg/dL (ref 0.3–1.2)
Total Protein: 7.8 g/dL (ref 6.5–8.1)

## 2022-12-09 LAB — SARS CORONAVIRUS 2 BY RT PCR: SARS Coronavirus 2 by RT PCR: NEGATIVE

## 2022-12-09 LAB — TROPONIN I (HIGH SENSITIVITY)
Troponin I (High Sensitivity): 9 ng/L (ref <18)
Troponin I (High Sensitivity): 9 ng/L (ref <18)

## 2022-12-09 LAB — TSH: TSH: 1.986 u[IU]/mL (ref 0.350–4.500)

## 2022-12-09 MED ORDER — HYDRALAZINE HCL 50 MG PO TABS
50.0000 mg | ORAL_TABLET | Freq: Once | ORAL | Status: AC
  Administered 2022-12-09: 50 mg via ORAL
  Filled 2022-12-09: qty 1

## 2022-12-09 MED ORDER — ALBUTEROL SULFATE (2.5 MG/3ML) 0.083% IN NEBU: 3.0000 mL | INHALATION_SOLUTION | Freq: Four times a day (QID) | RESPIRATORY_TRACT | Status: DC | PRN

## 2022-12-09 MED ORDER — ACETAMINOPHEN 500 MG PO TABS
500.0000 mg | ORAL_TABLET | Freq: Four times a day (QID) | ORAL | Status: DC | PRN
  Administered 2022-12-10: 500 mg via ORAL
  Filled 2022-12-09: qty 1

## 2022-12-09 MED ORDER — HYDRALAZINE HCL 50 MG PO TABS
50.0000 mg | ORAL_TABLET | Freq: Three times a day (TID) | ORAL | Status: DC
  Administered 2022-12-09 – 2022-12-10 (×3): 50 mg via ORAL
  Filled 2022-12-09 (×3): qty 1

## 2022-12-09 MED ORDER — IPRATROPIUM-ALBUTEROL 0.5-2.5 (3) MG/3ML IN SOLN
3.0000 mL | Freq: Once | RESPIRATORY_TRACT | Status: AC
  Administered 2022-12-09: 3 mL via RESPIRATORY_TRACT
  Filled 2022-12-09: qty 3

## 2022-12-09 MED ORDER — TAMSULOSIN HCL 0.4 MG PO CAPS: 0.4000 mg | ORAL_CAPSULE | Freq: Every day | ORAL | Status: DC

## 2022-12-09 MED ORDER — SODIUM CHLORIDE 0.9% FLUSH
3.0000 mL | Freq: Two times a day (BID) | INTRAVENOUS | Status: DC
  Administered 2022-12-09 – 2022-12-10 (×3): 3 mL via INTRAVENOUS

## 2022-12-09 MED ORDER — FLUTICASONE PROPIONATE 50 MCG/ACT NA SUSP
1.0000 | Freq: Every day | NASAL | Status: DC
  Administered 2022-12-10: 1 via NASAL
  Filled 2022-12-09: qty 16

## 2022-12-09 MED ORDER — ATORVASTATIN CALCIUM 20 MG PO TABS: 80.0000 mg | ORAL_TABLET | Freq: Every day | ORAL | Status: DC

## 2022-12-09 MED ORDER — METOPROLOL TARTRATE 50 MG PO TABS
50.0000 mg | ORAL_TABLET | Freq: Two times a day (BID) | ORAL | Status: DC
  Administered 2022-12-10: 50 mg via ORAL
  Filled 2022-12-09: qty 1

## 2022-12-09 MED ORDER — BUDESONIDE 0.25 MG/2ML IN SUSP
0.2500 mg | Freq: Two times a day (BID) | RESPIRATORY_TRACT | Status: DC
  Administered 2022-12-10: 0.25 mg via RESPIRATORY_TRACT
  Filled 2022-12-09: qty 2

## 2022-12-09 MED ORDER — SODIUM CHLORIDE 0.9 % IV BOLUS
1000.0000 mL | Freq: Once | INTRAVENOUS | Status: AC
  Administered 2022-12-09: 1000 mL via INTRAVENOUS

## 2022-12-09 MED ORDER — LORATADINE 10 MG PO TABS
10.0000 mg | ORAL_TABLET | Freq: Every day | ORAL | Status: DC
  Administered 2022-12-10: 10 mg via ORAL
  Filled 2022-12-09: qty 1

## 2022-12-09 MED ORDER — FLUTICASONE FUROATE 100 MCG/ACT IN AEPB: 1.0000 | INHALATION_SPRAY | Freq: Every day | RESPIRATORY_TRACT | Status: DC

## 2022-12-09 MED ORDER — ASPIRIN 81 MG PO TBEC
81.0000 mg | DELAYED_RELEASE_TABLET | Freq: Every day | ORAL | Status: DC
  Administered 2022-12-10: 81 mg via ORAL
  Filled 2022-12-09: qty 1

## 2022-12-09 MED ORDER — APIXABAN 5 MG PO TABS
5.0000 mg | ORAL_TABLET | Freq: Two times a day (BID) | ORAL | Status: DC
  Administered 2022-12-09 – 2022-12-10 (×2): 5 mg via ORAL
  Filled 2022-12-09 (×2): qty 1

## 2022-12-09 MED ORDER — ENSURE ENLIVE PO LIQD
237.0000 mL | Freq: Two times a day (BID) | ORAL | Status: DC
  Administered 2022-12-10: 237 mL via ORAL

## 2022-12-09 MED ORDER — PANTOPRAZOLE SODIUM 40 MG PO TBEC
40.0000 mg | DELAYED_RELEASE_TABLET | Freq: Two times a day (BID) | ORAL | Status: DC
  Administered 2022-12-10: 40 mg via ORAL
  Filled 2022-12-09: qty 1

## 2022-12-09 MED ORDER — FOLIC ACID 1 MG PO TABS
1.0000 mg | ORAL_TABLET | Freq: Every day | ORAL | Status: DC
  Administered 2022-12-10: 1 mg via ORAL
  Filled 2022-12-09: qty 1

## 2022-12-09 NOTE — ED Provider Notes (Signed)
[facility] Provider Note    Event Date/Time   First MD Initiated Contact with Patient 12/09/22 1056     (approximate)   History   Weakness   HPI  Keith Cruz is a 82 y.o. male with a history of hypertension, prediabetes, hyperlipidemia, and COPD who presents with neurolyse weakness, acute onset around 9 AM when he was walking the dog.  The patient states that when he first got up and was walking around in his house he felt normal.  While walking his dog he felt very weak and lightheaded and had to crouch down and hold himself up.  He did not lose consciousness.  He started to have a sensation that everything was spinning.  Since that time he has been feeling weak and unable to stand or walk.  He denies any worsened weakness on one side or the other.  He has no headache.  He currently has no vision changes although does report that over the last month he has had episodes in which his vision goes white when he first goes outside into the sunlight.  He has also had episodes of almost blacking out.  He denies any chest pain but does have some pressure on the left side of his chest.  He has no shortness of breath.  He denies any vomiting or diarrhea.  He denies any prior episodes like what he is having today.  I reviewed the past medical records.  The patient was seen by primary care on 7/17 for follow-up of his chronic conditions.  He has no recent ED visits or hospitalizations.   Physical Exam   Triage Vital Signs: ED Triage Vitals  Encounter Vitals Group     BP 12/09/22 1100 (!) 159/67     Systolic BP Percentile --      Diastolic BP Percentile --      Pulse Rate 12/09/22 1057 84     Resp 12/09/22 1057 18     Temp 12/09/22 1057 97.9 F (36.6 C)     Temp Source 12/09/22 1057 Oral     SpO2 12/09/22 1057 99 %     Weight 12/09/22 1055 210 lb (95.3 kg)     Height 12/09/22 1055 6\' 4"  (1.93 m)     Head Circumference --      Peak Flow --      Pain Score  12/09/22 1055 0     Pain Loc --      Pain Education --      Exclude from Growth Chart --     Most recent vital signs: Vitals:   12/09/22 1617 12/09/22 1619  BP: 112/69 119/68  Pulse: 88 93  Resp:    Temp:    SpO2:       General: Alert and oriented, weak appearing but in no acute distress. CV:  Good peripheral perfusion.  Resp:  Normal effort.  Lungs CTAB. Abd:  No distention.  Other:  EOMI.  PERRLA.  No nystagmus.  No photophobia.  No facial droop.  Normal speech and language.  4/5 motor strength diffusely but no drift.  Tremor to bilateral upper extremities, possible mild ataxia bilaterally.   ED Results / Procedures / Treatments   Labs (all labs ordered are listed, but only abnormal results are displayed) Labs Reviewed  COMPREHENSIVE METABOLIC PANEL - Abnormal; Notable for the following components:      Result Value   Glucose, Bld 136 (*)    Creatinine, Ser 1.27 (*)  GFR, Estimated 56 (*)    All other components within normal limits  CBC WITH DIFFERENTIAL/PLATELET - Abnormal; Notable for the following components:   RBC 3.25 (*)    Hemoglobin 11.9 (*)    HCT 36.7 (*)    MCV 112.9 (*)    MCH 36.6 (*)    All other components within normal limits  URINALYSIS, ROUTINE W REFLEX MICROSCOPIC - Abnormal; Notable for the following components:   Color, Urine YELLOW (*)    APPearance CLEAR (*)    All other components within normal limits  SARS CORONAVIRUS 2 BY RT PCR  TSH  SAMPLE TO BLOOD BANK  TROPONIN I (HIGH SENSITIVITY)  TROPONIN I (HIGH SENSITIVITY)     EKG  ED ECG REPORT I, Dionne Bucy, the attending physician, personally viewed and interpreted this ECG.  Date: 12/09/2022 EKG Time: 1059 Rate: 83 Rhythm: normal sinus rhythm QRS Axis: normal Intervals: LBBB ST/T Wave abnormalities: normal Narrative Interpretation: no evidence of acute ischemia; no recent prior EKG available for comparison    RADIOLOGY  CT head: I independently viewed and  interpreted the images; there is no ICH.  Radiology report indicates no acute intracranial findings   PROCEDURES:  Critical Care performed: No  Procedures   MEDICATIONS ORDERED IN ED: Medications  acetaminophen (TYLENOL) tablet 500 mg (has no administration in time range)  aspirin EC tablet 81 mg (has no administration in time range)  atorvastatin (LIPITOR) tablet 80 mg (has no administration in time range)  hydrALAZINE (APRESOLINE) tablet 50 mg (has no administration in time range)  metoprolol tartrate (LOPRESSOR) tablet 50 mg (has no administration in time range)  pantoprazole (PROTONIX) EC tablet 40 mg (has no administration in time range)  tamsulosin (FLOMAX) capsule 0.4 mg (has no administration in time range)  apixaban (ELIQUIS) tablet 5 mg (has no administration in time range)  folic acid (FOLVITE) tablet 1 mg (has no administration in time range)  albuterol (PROVENTIL) (2.5 MG/3ML) 0.083% nebulizer solution 3 mL (has no administration in time range)  Fluticasone Furoate AEPB 1 puff (has no administration in time range)  loratadine (CLARITIN) tablet 10 mg (has no administration in time range)  ipratropium-albuterol (DUONEB) 0.5-2.5 (3) MG/3ML nebulizer solution 3 mL (has no administration in time range)  sodium chloride flush (NS) 0.9 % injection 3 mL (has no administration in time range)  fluticasone (FLONASE) 50 MCG/ACT nasal spray 1 spray (has no administration in time range)  sodium chloride 0.9 % bolus 1,000 mL (0 mLs Intravenous Stopped 12/09/22 1334)  hydrALAZINE (APRESOLINE) tablet 50 mg (50 mg Oral Given 12/09/22 1417)     IMPRESSION / MDM / ASSESSMENT AND PLAN / ED COURSE  I reviewed the triage vital signs and the nursing notes.  82 year old male with PMH as noted above presents with acute onset of generalized weakness while walking the dog today associated with vertigo type dizziness as well as near syncope.  On exam currently, the patient is hypertensive with  otherwise normal vital signs.  He does have bilateral upper extremity tremors which are new and possible very mild ataxia but no focal weakness or numbness.  He appears diffusely slightly weak in all extremities and seeks he cannot stand or walk, but has no focal deficits.  Differential diagnosis includes, but is not limited to, vasovagal episode, dehydration/hypovolemia, electrolyte abnormality, other metabolic disturbance, acute MI, other cardiac cause, UTI, COVID, or other infection.  The patient has no focal or unilateral neurologic findings to suggest acute stroke.  We will  obtain CT head, lab workup, give fluids, and reassess.  Patient's presentation is most consistent with acute presentation with potential threat to life or bodily function.  The patient is on the cardiac monitor to evaluate for evidence of arrhythmia and/or significant heart rate changes.   ----------------------------------------- 3:12 PM on 12/09/2022 -----------------------------------------  CT shows no concerning acute findings except for possible sinusitis.  Lab workup is unremarkable with no leukocytosis or significant anemia.  Electrolytes are normal.  Urinalysis is negative.  COVID is negative.  On reassessment after the fluids the patient is somewhat less tremulous.  He is able to stand up now although still gets quite lightheaded when standing and is unable to really ambulate.  He appears orthostatic.  I recommended inpatient admission for further workup and management.  I consulted Dr. Chipper Herb from the hospitalist service; based on our discussion he agrees to evaluate the patient for admission.   FINAL CLINICAL IMPRESSION(S) / ED DIAGNOSES   Final diagnoses:  Generalized weakness     Rx / DC Orders   ED Discharge Orders     None        Note:  This document was prepared using Dragon voice recognition software and may include unintentional dictation errors.    Dionne Bucy, MD 12/09/22  [phone]

## 2022-12-09 NOTE — H&P (Signed)
History and Physical    Keith Cruz WUJ:[id number] DOB: 1940/08/01 DOA: 12/09/2022  PCP: [name], NP (Confirm with patient/family/NH records and if not entered, this has to be entered at [facility] point of entry) Patient coming from: Home  I have personally briefly reviewed patient's old medical records in [facility]  Chief Complaint: Feeling light headed, almost passed out  HPI: Keith Cruz is a 82 y.o. male with medical history significant of CAD s/p CABG, bilateral carotid artery stenosis status post stenting on left side and endarterectomy on right side, multiple myeloma on active surveillance, COPD, CKD stage IIIa, PAF on Eliquis, presented with recurrent of near syncope's.  Symptoms started 2 to 3 weeks ago.  Frequent episodes of near syncope which patient described that " every time when I go outside see bright sunlight started feeling lightheaded and feeling about blackout" and patient had to sit down or lie down for few minutes to let episode pass and occasionally he does feel palpitations vs "drop one heart beat" but he is not sure whether those feeling in his chest overlapping with near syncope periods.  He denies any chest pain shortness of breath peripheral swelling.  No eating problems.  Patient family also reported the patient developed dysphagia after carotic artery surgery this year and only able to swallow soft food, but this morning at breakfast patient had trouble to swallow his regular soft food. No new medications recently.  ED Course: Afebrile, none tachycardia blood pressure borderline elevated 150/60, no hypoxia.  CT head negative for acute findings, blood work showed creatinine 1.2, K3.9, hemoglobin 11.9.  EKG showed sinus rhythm with LBBB  Review of Systems: As per HPI otherwise 14 point review of systems negative.    Past Medical History:  Diagnosis Date   Arthritis    back   Asthma    Atherosclerosis of aorta (HCC)    Atherosclerosis of aorta (HCC)     Bronchitis 05/2017   chronic   CAD (coronary artery disease)    Carotid artery disease (HCC)    Carotid stenosis    Carotid stenosis, bilateral    Chronic bronchitis (HCC)    Chronic cough    COPD (chronic obstructive pulmonary disease) (HCC)    Dysrhythmia    GERD (gastroesophageal reflux disease)    Heart murmur    HOH (hard of hearing)    hearing aids   Hypercholesteremia    Hypertension    Kidney disease    stent   Macular degeneration of left eye    Myocardial infarction (HCC) 2016   Pre-diabetes    Seasonal allergies    Seizure (HCC)    Seizures (HCC)    viral meningitis   Stroke [facility])    1991 and imaging 05/12/15 old strokes   TIA (transient ischemic attack) 1991   x2   Uses hearing aid    Viral meningitis 2017   Viral meningitis    Wears dentures    upper and lower    Past Surgical History:  Procedure Laterality Date   CAROTID ENDARTERECTOMY Left 2014   CAROTID ENDARTERECTOMY Right 1991   CAROTID PTA/STENT INTERVENTION Left 05/31/2020   Procedure: CAROTID PTA/STENT INTERVENTIO;  Surgeon: Annice Needy, MD;  Location: [facility] INVASIVE CV LAB;  Service: Cardiovascular;  Laterality: Left;   CATARACT EXTRACTION W/PHACO Left 06/24/2017   Procedure: CATARACT EXTRACTION PHACO AND INTRAOCULAR LENS PLACEMENT (IOC) LEFT BORDERLINE DIABETIC;  Surgeon: Lockie Mola, MD;  Location: [facility];  Service: Ophthalmology;  Laterality: Left;   CATARACT EXTRACTION W/PHACO Right 07/22/2017   Procedure: CATARACT EXTRACTION PHACO AND INTRAOCULAR LENS PLACEMENT (IOC) RIGHT BORDERLINE DIABETIC;  Surgeon: Lockie Mola, MD;  Location: [facility];  Service: Ophthalmology;  Laterality: Right;   CORONARY ARTERY BYPASS GRAFT     x5   SKIN LESION EXCISION Left    left ear   URETERAL STENT PLACEMENT       reports that he quit smoking about 52 years ago. His smoking use included cigarettes. He started smoking about 62 years ago. He has a 20 pack-year smoking  history. He has quit using smokeless tobacco. He reports that he does not drink alcohol and does not use drugs.  Allergies  Allergen Reactions   Fexofenadine-Pseudoephed Er Swelling    Tongue and face. Only with D product. Tolerates plain allegra at home.  .    Nifedipine Swelling    Tongue and face     Pseudoephedrine Swelling   Allegra  [Fexofenadine] Swelling    Tongue and face. Only with D product. Tolerates plain allegra at home.    Anoro Ellipta [Umeclidinium-Vilanterol]    Carvedilol Other (See Comments)    Light headed, heart racing, increase in blood pressure    Doxycycline Hyclate Other (See Comments)   Prednisone Other (See Comments)    Oral prednisone. Confusion. Mental status changes    Family History  Problem Relation Age of Onset   Stroke Mother    Heart attack Mother    Dementia Mother    Stroke Father    Stroke Sister    Alzheimer's disease Sister    Dementia Sister    Dementia Sister    Stroke Brother    Alzheimer's disease Maternal Grandmother    Dementia Maternal Grandmother    Stroke Paternal Grandmother    Stroke Paternal Grandfather    Alzheimer's disease Paternal Grandfather    Cancer Neg Hx      Prior to Admission medications   Medication Sig Start Date End Date Taking? Authorizing Provider  Accu-Chek Softclix Lancets lancets Test blood glucose once daily 10/14/22   [name], NP  acetaminophen (TYLENOL) 500 MG tablet Take 500 mg by mouth every 6 (six) hours as needed.    [provider]  albuterol (PROAIR HFA) 108 (90 Base) MCG/ACT inhaler INHALE 1-2 PUFFS INTO THE LUNGS EVERY 4 (FOUR) HOURS AS NEEDED FOR WHEEZING OR SHORTNESS OF BREATH. 11/22/21   McLean-Scocuzza, Pasty Spillers, MD  apixaban (ELIQUIS) 5 MG TABS tablet TAKE 1 TABLET BY MOUTH TWICE DAILY 04/30/22   Dew, Marlow Baars, MD  ARNUITY ELLIPTA 100 MCG/ACT AEPB Inhale 1 puff into the lungs daily. 10/08/22   [provider]  aspirin EC 81 MG tablet Take 81 mg by mouth daily.     [provider]  atorvastatin (LIPITOR) 80 MG tablet Take 1 tablet (80 mg total) by mouth daily. 11/22/21   McLean-Scocuzza, Pasty Spillers, MD  blood glucose meter kit and supplies Dispense based on patient and insurance preference. Use as directed to check blood glucose once daily. 10/14/22   [name], NP  Cholecalciferol (VITAMIN D3) 2000 UNITS capsule Take 2,000 Units by mouth daily. am    [provider]  fexofenadine (ALLEGRA) 180 MG tablet Take 1 tablet (180 mg total) by mouth daily as needed for allergies or rhinitis. 11/22/21   McLean-Scocuzza, Pasty Spillers, MD  folic acid (FOLVITE) 1 MG tablet Take 0.8 mg by mouth daily. am    [provider]  glucose  blood test strip Test blood glucose once daily 10/14/22   [name], NP  hydrALAZINE (APRESOLINE) 50 MG tablet Take 1 tablet (50 mg total) by mouth 3 (three) times daily. 11/22/21   McLean-Scocuzza, Pasty Spillers, MD  metoprolol tartrate (LOPRESSOR) 50 MG tablet Take 1 tablet (50 mg total) by mouth 2 (two) times daily. D/c coreg pt prefers metoprolol 11/22/21   McLean-Scocuzza, Pasty Spillers, MD  Multiple Vitamins-Minerals (CENTRUM SILVER 50+MEN PO) Take by mouth.    [provider]  pantoprazole (PROTONIX) 40 MG tablet Take 1 tablet (40 mg total) by mouth 2 (two) times daily before a meal. TRY TO TAKE ONLY 1X PER DAY INSTEAD OF 2 30 MIN BEFORE A MEAL 11/22/21   McLean-Scocuzza, Pasty Spillers, MD  tamsulosin (FLOMAX) 0.4 MG CAPS capsule Take 1 capsule by mouth daily. 12/17/20   [provider]  tiZANidine HCl (ZANAFLEX PO) Take 1 tablet by mouth at bedtime as needed (muscle spasms). Patient not taking: Reported on 07/09/2022 05/04/22   [provider]  triamcinolone cream (KENALOG) 0.1 % APPLY TO AFFECTED AREA TWICE A DAY 09/16/22   [name], NP    Physical Exam: Vitals:   12/09/22 1400 12/09/22 1430 12/09/22 1500 12/09/22 1530  BP: (!) 157/56 (!) 175/68 (!) 151/63 (!) 145/66  Pulse: 73 75 77 80  Resp: 16 15 15 20    Temp:      TempSrc:      SpO2: 98% 97% 96% 99%  Weight:      Height:        Constitutional: NAD, calm, comfortable Vitals:   12/09/22 1400 12/09/22 1430 12/09/22 1500 12/09/22 1530  BP: (!) 157/56 (!) 175/68 (!) 151/63 (!) 145/66  Pulse: 73 75 77 80  Resp: 16 15 15 20   Temp:      TempSrc:      SpO2: 98% 97% 96% 99%  Weight:      Height:       Eyes: PERRL, lids and conjunctivae normal ENMT: Mucous membranes are moist. Posterior pharynx clear of any exudate or lesions.Normal dentition.  Neck: normal, supple, no masses, no thyromegaly Respiratory: clear to auscultation bilaterally, no wheezing, no crackles. Normal respiratory effort. No accessory muscle use.  Cardiovascular: Regular rate and rhythm, no murmurs / rubs / gallops. No extremity edema. 2+ pedal pulses. No carotid bruits.  Abdomen: no tenderness, no masses palpated. No hepatosplenomegaly. Bowel sounds positive.  Musculoskeletal: no clubbing / cyanosis. No joint deformity upper and lower extremities. Good ROM, no contractures. Normal muscle tone.  Bilateral fingertips tremors 3 Hz Skin: no rashes, lesions, ulcers. No induration Neurologic: CN 2-12 grossly intact. Sensation intact, DTR normal. Strength 5/5 in all 4.  Psychiatric: Normal judgment and insight. Alert and oriented x 3. Normal mood.     Labs on Admission: I have personally reviewed following labs and imaging studies  CBC: Recent Labs  Lab 12/09/22 1058  WBC 7.0  NEUTROABS 5.0  HGB 11.9*  HCT 36.7*  MCV 112.9*  PLT 176   Basic Metabolic Panel: Recent Labs  Lab 12/09/22 1058  NA 136  K 3.9  CL 105  CO2 22  GLUCOSE 136*  BUN 22  CREATININE 1.27*  CALCIUM 8.9   GFR: Estimated Creatinine Clearance: 55.1 mL/min (A) (by C-G formula based on SCr of 1.27 mg/dL (H)). Liver Function Tests: Recent Labs  Lab 12/09/22 1058  AST 29  ALT 20  ALKPHOS 49  BILITOT 0.7  PROT 7.8  ALBUMIN 4.2   No results  for input(s): "LIPASE", "AMYLASE" in the  last 168 hours. No results for input(s): "AMMONIA" in the last 168 hours. Coagulation Profile: No results for input(s): "INR", "PROTIME" in the last 168 hours. Cardiac Enzymes: No results for input(s): "CKTOTAL", "CKMB", "CKMBINDEX", "TROPONINI" in the last 168 hours. BNP (last 3 results) No results for input(s): "PROBNP" in the last 8760 hours. HbA1C: No results for input(s): "HGBA1C" in the last 72 hours. CBG: No results for input(s): "GLUCAP" in the last 168 hours. Lipid Profile: No results for input(s): "CHOL", "HDL", "LDLCALC", "TRIG", "CHOLHDL", "LDLDIRECT" in the last 72 hours. Thyroid Function Tests: No results for input(s): "TSH", "T4TOTAL", "FREET4", "T3FREE", "THYROIDAB" in the last 72 hours. Anemia Panel: No results for input(s): "VITAMINB12", "FOLATE", "FERRITIN", "TIBC", "IRON", "RETICCTPCT" in the last 72 hours. Urine analysis:    Component Value Date/Time   COLORURINE YELLOW (A) 12/09/2022 1228   APPEARANCEUR CLEAR (A) 12/09/2022 1228   APPEARANCEUR Clear 06/28/2018 0758   LABSPEC 1.006 12/09/2022 1228   PHURINE 5.0 12/09/2022 1228   GLUCOSEU NEGATIVE 12/09/2022 1228   HGBUR NEGATIVE 12/09/2022 1228   BILIRUBINUR NEGATIVE 12/09/2022 1228   BILIRUBINUR Negative 06/28/2018 0758   KETONESUR NEGATIVE 12/09/2022 1228   PROTEINUR NEGATIVE 12/09/2022 1228   NITRITE NEGATIVE 12/09/2022 1228   LEUKOCYTESUR NEGATIVE 12/09/2022 1228    Radiological Exams on Admission: CT Head Wo Contrast  Result Date: 12/09/2022 CLINICAL DATA:  Neuro deficit, acute, stroke suspected EXAM: CT HEAD WITHOUT CONTRAST TECHNIQUE: Contiguous axial images were obtained from the base of the skull through the vertex without intravenous contrast. RADIATION DOSE REDUCTION: This exam was performed according to the departmental dose-optimization program which includes automated exposure control, adjustment of the mA and/or kV according to patient size and/or use of iterative reconstruction technique.  COMPARISON:  CT Head 05/11/18 FINDINGS: Brain: No hemorrhage. No hydrocephalus. No extra-axial fluid collection. Sequela of moderate chronic microvascular ischemic change. Chronic infarct in the right parietal lobe. No mass effect. No mass lesion. Vascular: No hyperdense vessel or unexpected calcification. Skull: Normal. Negative for fracture or focal lesion. Sinuses/Orbits: Moderate bilateral mastoid effusions. Small left middle ear effusion. Pansinus mucosal thickening with frothy secretions, which can be seen in the setting of acute sinusitis. Bilateral lens replacement. Orbits are otherwise unremarkable. Other: None. IMPRESSION: 1. No acute intracranial abnormality. 2. Moderate bilateral mastoid effusions. Small left middle ear effusion. 3. Pansinus mucosal thickening with frothy secretions, which can be seen in the setting of acute sinusitis. Electronically Signed   By: Lorenza Cambridge M.D.   On: 12/09/2022 14:20    EKG: Independently reviewed. LBBB, sinus, frequent PVCs  Assessment/Plan Principal Problem:   Near syncope Active Problems:   Atrial fibrillation (HCC)   Carotid artery narrowing  (please populate well all problems here in Problem List. (For example, if patient is on BP meds at home and you resume or decide to hold them, it is a problem that needs to be her. Same for CAD, COPD, HLD and so on)  Near syncope, recurrent -Vasovagal versus cardiac -Clinically patient appears to be euvolemic, will check orthostatic vital signs twice daily -Continue telemonitoring x 24 hours -Arrhythmia cannot be ruled out.  Case was discussed with [facility] cardiology PA Tang, compared to most recent EKG done in June this year showed LBBB is chronic.  No significant record of ventricular arrhythmia at [facility] record.  [facility] cardiology will see patient tomorrow morning -Repeat echocardiogram -Other DDx, patient on chronic Eliquis therapy and status post bilateral carotid stenosis surgery with recent  normal carotid  Doppler, and no focal neurodeficit on physical exam, low suspicion for TIA/stroke  Tremors -No clear etiology, will check TSH -Outpatient with PCP/neurology to rule out new onset Parkinson's  Chronic dysphagia -Likely related to bilateral carotic intervention -Swallow appeared to be back to his baseline, continue soft diet, consult speech evaluation for a.m.  Hx of MM with chronic macrocytic anemia -H/H stable, has been under active surveillance, no Hx of chemo  PAF -In sinus rhythm -Continue beta-blocker, continue Eliquis  COPD -Stable, no acute concerns.    DVT prophylaxis: Eliquis Code Status: Full code Family Communication: Wife at bedside Disposition Plan: Expect less than 2 midnight hospital stay Consults called: [facility] cardiology Admission status: Tele obs   Emeline General MD [facility] Pager [phone]  12/09/2022, 3:45 PM

## 2022-12-09 NOTE — ED Triage Notes (Signed)
Patient states he woke up this morning and felt he was at his baseline, walked his dog outside and began feeling weak all over body; denies sick contacts and all other symptoms.

## 2022-12-09 NOTE — ED Notes (Signed)
Patient unable to stand for orthostatic vital signs, MD Siadecki made aware

## 2022-12-09 NOTE — Plan of Care (Signed)
  Problem: Education: Goal: Knowledge of condition and prescribed therapy will improve Outcome: Progressing   Problem: Cardiac: Goal: Will achieve and/or maintain adequate cardiac output Outcome: Progressing   Problem: Education: Goal: Knowledge of General Education information will improve Description: Including pain rating scale, medication(s)/side effects and non-pharmacologic comfort measures Outcome: Progressing   Problem: Health Behavior/Discharge Planning: Goal: Ability to manage health-related needs will improve Outcome: Progressing

## 2022-12-10 ENCOUNTER — Observation Stay
Admit: 2022-12-10 | Discharge: 2022-12-10 | Disposition: A | Discharge status: Home / Self Care | Payer: Medicare Other | Attending: Cardiology | Admitting: Cardiology

## 2022-12-10 ENCOUNTER — Observation Stay: Admit: 2022-12-10 | Payer: Medicare Other

## 2022-12-10 DIAGNOSIS — R55 Syncope and collapse: Secondary | ICD-10-CM | POA: Diagnosis not present

## 2022-12-10 DIAGNOSIS — R531 Weakness: Secondary | ICD-10-CM

## 2022-12-10 DIAGNOSIS — J441 Chronic obstructive pulmonary disease with (acute) exacerbation: Secondary | ICD-10-CM

## 2022-12-10 DIAGNOSIS — I480 Paroxysmal atrial fibrillation: Secondary | ICD-10-CM | POA: Diagnosis not present

## 2022-12-10 DIAGNOSIS — I2510 Atherosclerotic heart disease of native coronary artery without angina pectoris: Secondary | ICD-10-CM | POA: Diagnosis not present

## 2022-12-10 MED ORDER — TRAZODONE HCL 50 MG PO TABS
25.0000 mg | ORAL_TABLET | Freq: Every evening | ORAL | Status: DC | PRN
  Administered 2022-12-10: 25 mg via ORAL
  Filled 2022-12-10: qty 1

## 2022-12-10 MED ORDER — DM-GUAIFENESIN ER 30-600 MG PO TB12
1.0000 | ORAL_TABLET | Freq: Two times a day (BID) | ORAL | Status: DC
  Administered 2022-12-10: 1 via ORAL
  Filled 2022-12-10: qty 1

## 2022-12-10 MED ORDER — FLUTICASONE FUROATE-VILANTEROL 100-25 MCG/ACT IN AEPB
1.0000 | INHALATION_SPRAY | Freq: Every day | RESPIRATORY_TRACT | Status: DC
  Filled 2022-12-10: qty 28

## 2022-12-10 MED ORDER — FLUTICASONE PROPIONATE 50 MCG/ACT NA SUSP: 1.0000 | Freq: Every day | NASAL | 0 refills | Status: DC

## 2022-12-10 MED ORDER — ENSURE ENLIVE PO LIQD
237.0000 mL | Freq: Three times a day (TID) | ORAL | Status: DC
  Administered 2022-12-10: 237 mL via ORAL

## 2022-12-10 MED ORDER — ADULT MULTIVITAMIN W/MINERALS CH
1.0000 | ORAL_TABLET | Freq: Every day | ORAL | Status: DC
  Administered 2022-12-10: 1 via ORAL
  Filled 2022-12-10: qty 1

## 2022-12-10 NOTE — Progress Notes (Addendum)
Initial Nutrition Assessment  DOCUMENTATION CODES:   Not applicable  INTERVENTION:   -Ensure Enlive po TID, each supplement provides 350 kcal and 20 grams of protein -MVI with minerals daily  NUTRITION DIAGNOSIS:   Increased nutrient needs related to chronic illness (COPD) as evidenced by estimated needs.  GOAL:   Patient will meet greater than or equal to 90% of their needs  MONITOR:   PO intake, Supplement acceptance  REASON FOR ASSESSMENT:   Malnutrition Screening Tool    ASSESSMENT:   Pt with medical history significant of CAD s/p CABG, bilateral carotid artery stenosis status post stenting on left side and endarterectomy on right side, multiple myeloma on active surveillance, COPD, CKD stage IIIa, PAF on Eliquis, presented with recurrent of near syncope  Pt admitted with recurrent near syncope (vasovagal vs cardiac).  Reviewed I/O's: -1.2 L x 24 hours  UOP: 2.4 L x 24 hours  Pt unavailable at time of visit. Attempted to speak with pt via call to hospital room phone, however, unable to reach. RD unable to obtain further nutrition-related history or complete nutrition-focused physical exam at this time.     Per H&P, pt has had dysphagia after carotid artery surgery this year and usually eats softer textured foods at home, however, had difficulty swallowing his breakfast this yesterday morning (12/09/22). SLP consult pending. Pt also with tremors; plan outpatient follow-up with neurology to rule out Parkinson's.   Reviewed wt hx; pt has experienced a 1.1% wt loss over the past 6 months, which is not significant for time frame.    Medications reviewed and include folic acid.   Lab Results  Component Value Date   HGBA1C 5.9 11/12/2022   PTA DM medications are none.   Labs reviewed: CBGS: 123 (inpatient orders for glycemic control are none).    Diet Order:   Diet Order             DIET DYS 3 Room service appropriate? Yes; Fluid consistency: Thin  Diet effective  now                   EDUCATION NEEDS:   No education needs have been identified at this time  Skin:  Skin Assessment: Reviewed RN Assessment  Last BM:  12/09/22  Height:   Ht Readings from Last 1 Encounters:  12/09/22 6\' 4"  (1.93 m)    Weight:   Wt Readings from Last 1 Encounters:  12/10/22 93.2 kg    Ideal Body Weight:  91.8 kg  BMI:  Body mass index is 25.01 kg/m.  Estimated Nutritional Needs:   Kcal:  2100-2300  Protein:  105-120 grams  Fluid:  > 2 L    Levada Schilling, RD, LDN, CDCES Registered Dietitian II Certified Diabetes Care and Education Specialist Please refer to [facility] for RD and/or RD on-call/weekend/after hours pager

## 2022-12-10 NOTE — Progress Notes (Addendum)
Patient ID: Keith Cruz, male   DOB: July 15, 1940, 82 y.o.   MRN: [id number]  Brief cardiology consult note Patient seen this morning with reported episode of syncope lightheaded dizziness he complains of generalized weakness denied any significant chest pain palpitations or tachycardia. Patient states he feels much better now and feels well enough to go home EKGs of essentially unchanged with left bundle branch block found on previous EKGs  Cardiac enzymes are unremarkable Telemetry is unremarkable  Echo preliminary borderline low normal at 50% paradoxical septal motion suggestive of interventricular conduction delay  Cardiac exam relatively benign    Impression Possibly vasovagal syncope Doubt cardiac etiology of syncope History of known coronary artery disease Peripheral vascular disease Obesity History of coronary bypass surgery Hypertension COPD Multiple myeloma Bilateral carotid stenosis  chronic renal insufficiency Atrial fibrillation  Plan Recommend conservative management Maintain adequate hydration Consider support stockings Do not recommend any invasive procedures at this stage Consider follow-up with cardiology as an outpatient 1 to 2 weeks

## 2022-12-10 NOTE — Discharge Instructions (Signed)
Continue your medications as prior to admission.  I recommend to continue the steroid nasal spray (flonase) to help improve your sinuses which appear to be congested on your imaging.  Stay well hydrated. Follow up with cardiology in 1-2 weeks.  Return if your symptoms return.

## 2022-12-10 NOTE — Discharge Summary (Signed)
Physician Discharge Summary  Patient: Keith Cruz ZOX:[id number] DOB: Jun 06, 1940   Code Status: Full Code Admit date: 12/09/2022 Discharge date: 12/10/2022 Disposition: Home health, PT PCP: [name], NP  Recommendations for Outpatient Follow-up:  Follow up with PCP within 1-2 weeks Regarding general hospital follow up and preventative care Recommend    Discharge Diagnoses:  Principal Problem:   Near syncope Active Problems:   Atrial fibrillation (HCC)   Carotid artery narrowing   Generalized weakness   Acute exacerbation of chronic obstructive pulmonary disease (COPD) [facility])  Brief Hospital Course Summary: Keith Cruz is a 82 y.o. male with medical history significant of CAD s/p CABG, bilateral carotid artery stenosis status post stenting on left side and endarterectomy on right side, multiple myeloma on active surveillance, COPD, CKD stage IIIa, PAF on Eliquis, presented with recurrent of near syncope's.   Symptoms started 2 to 3 weeks ago.  Frequent episodes of near syncope which patient described that " every time when I go outside see bright sunlight started feeling lightheaded and feeling about blackout" and patient had to sit down or lie down for few minutes to let episode pass and occasionally he does feel palpitations vs "drop one heart beat" but he is not sure whether those feeling in his chest overlapping with near syncope periods.  He denies any chest pain shortness of breath peripheral swelling.  No eating problems.  Patient family also reported the patient developed dysphagia after carotic artery surgery this year and only able to swallow soft food, but this morning at breakfast patient had trouble to swallow his regular soft food. No new medications recently.   ED Course: Afebrile, none tachycardia blood pressure borderline elevated 150/60, no hypoxia.  CT head negative for acute findings, blood work showed creatinine 1.2, K3.9, hemoglobin 11.9.  EKG showed sinus  rhythm with LBBB.  Cardiology was consulted and evaluated the LBBB and compared it to prior ECG as unchanged. No acute cardiac injury was seen on ECG and troponins were negative. Recent carotid US negative for stenosis. Echo this admission showed EF 40-45%.   He quickly returned to his baseline mentation and physical function overnight. He was eating well and strongly pushed to go home.  His symptoms were consistent with a vasovagal response and cardiology recommended conservative management including compression stockings, hydration, and outpatient follow up.  Discharge Condition: Good, improved Recommended discharge diet: Regular healthy diet  Consultations: Cardiology   Procedures/Studies: Echo   Allergies as of 12/10/2022       Reactions   Fexofenadine-pseudoephed Er Swelling   Tongue and face. Only with D product. Tolerates plain allegra at home.  .    Nifedipine Swelling   Tongue and face   Pseudoephedrine Swelling   Anoro Ellipta [umeclidinium-vilanterol]    Carvedilol Other (See Comments)   Light headed, heart racing, increase in blood pressure    Doxycycline Hyclate Other (See Comments)   Prednisone Other (See Comments)   Oral prednisone. Confusion. Mental status changes        Medication List     STOP taking these medications    ZANAFLEX PO       TAKE these medications    Accu-Chek Softclix Lancets lancets Test blood glucose once daily   acetaminophen 500 MG tablet Commonly known as: TYLENOL Take 500 mg by mouth every 6 (six) hours as needed.   albuterol 108 (90 Base) MCG/ACT inhaler Commonly known as: ProAir HFA INHALE 1-2 PUFFS INTO THE LUNGS EVERY 4 (FOUR)  HOURS AS NEEDED FOR WHEEZING OR SHORTNESS OF BREATH.   Arnuity Ellipta 100 MCG/ACT Aepb Generic drug: Fluticasone Furoate Inhale 1 puff into the lungs daily.   aspirin EC 81 MG tablet Take 81 mg by mouth daily.   atorvastatin 80 MG tablet Commonly known as: LIPITOR Take 1 tablet (80 mg  total) by mouth daily.   blood glucose meter kit and supplies Dispense based on patient and insurance preference. Use as directed to check blood glucose once daily.   CENTRUM SILVER 50+MEN PO Take by mouth.   Eliquis 5 MG Tabs tablet Generic drug: apixaban TAKE 1 TABLET BY MOUTH TWICE DAILY What changed: how much to take   fexofenadine 180 MG tablet Commonly known as: ALLEGRA Take 1 tablet (180 mg total) by mouth daily as needed for allergies or rhinitis.   fluticasone 50 MCG/ACT nasal spray Commonly known as: FLONASE Place 1 spray into both nostrils daily. Start taking on: December 11, 2022   folic acid 1 MG tablet Commonly known as: FOLVITE Take 1 mg by mouth daily. am   glucose blood test strip Test blood glucose once daily   hydrALAZINE 50 MG tablet Commonly known as: APRESOLINE Take 1 tablet (50 mg total) by mouth 3 (three) times daily.   metoprolol tartrate 50 MG tablet Commonly known as: LOPRESSOR Take 1 tablet (50 mg total) by mouth 2 (two) times daily. D/c coreg pt prefers metoprolol   pantoprazole 40 MG tablet Commonly known as: PROTONIX Take 1 tablet (40 mg total) by mouth 2 (two) times daily before a meal. TRY TO TAKE ONLY 1X PER DAY INSTEAD OF 2 30 MIN BEFORE A MEAL   tamsulosin 0.4 MG Caps capsule Commonly known as: FLOMAX Take 0.4 mg by mouth daily.   triamcinolone cream 0.1 % Commonly known as: KENALOG APPLY TO AFFECTED AREA TWICE A DAY   Vitamin D3 50 MCG (2000 UT) capsule Take 2,000 Units by mouth daily. am        Follow-up Information     Dorothyann Peng D, MD. Schedule an appointment as soon as possible for a visit in 2 week(s).   Specialties: Cardiology, Internal Medicine Why: Your appointment has been made for August 27th at 10:30am. Thanks! Contact information: [address] [place] [address] [phone]                 Subjective   Pt reports feeling well. Has no difficulty with swallowing or eating. Has  been up walking around the hallways without syncopal symptoms, chest pain, SOB. Denies LE swelling.No complaints. He wants to go home right now.   All questions and concerns were addressed at time of discharge.  Objective  Blood pressure 132/71, pulse 76, temperature 98 F (36.7 C), resp. rate 18, height 6\' 4"  (1.93 m), weight 93.2 kg, SpO2 96%.   General: Pt is alert, awake, not in acute distress Cardiovascular: RRR, S1/S2 +, no rubs, no gallops Respiratory: CTA bilaterally, no wheezing, no rhonchi Abdominal: Soft, NT, ND, bowel sounds + Extremities: no edema, no cyanosis  The results of significant diagnostics from this hospitalization (including imaging, microbiology, ancillary and laboratory) are listed below for reference.   Imaging studies: X-ray chest PA and lateral  Result Date: 12/09/2022 CLINICAL DATA:  Near-syncope EXAM: CHEST - 2 VIEW COMPARISON:  X-ray 02/19/2021 FINDINGS: Sternal wires. Hyperinflation. No consolidation, pneumothorax or effusion. No edema overlapping cardiac leads. Normal cardiopericardial silhouette IMPRESSION: Postop chest with hyperinflation.  No acute cardiopulmonary disease Electronically Signed  By: Karen Kays M.D.   On: 12/09/2022 16:42   CT Head Wo Contrast  Result Date: 12/09/2022 CLINICAL DATA:  Neuro deficit, acute, stroke suspected EXAM: CT HEAD WITHOUT CONTRAST TECHNIQUE: Contiguous axial images were obtained from the base of the skull through the vertex without intravenous contrast. RADIATION DOSE REDUCTION: This exam was performed according to the departmental dose-optimization program which includes automated exposure control, adjustment of the mA and/or kV according to patient size and/or use of iterative reconstruction technique. COMPARISON:  CT Head 05/11/18 FINDINGS: Brain: No hemorrhage. No hydrocephalus. No extra-axial fluid collection. Sequela of moderate chronic microvascular ischemic change. Chronic infarct in the right parietal lobe. No  mass effect. No mass lesion. Vascular: No hyperdense vessel or unexpected calcification. Skull: Normal. Negative for fracture or focal lesion. Sinuses/Orbits: Moderate bilateral mastoid effusions. Small left middle ear effusion. Pansinus mucosal thickening with frothy secretions, which can be seen in the setting of acute sinusitis. Bilateral lens replacement. Orbits are otherwise unremarkable. Other: None. IMPRESSION: 1. No acute intracranial abnormality. 2. Moderate bilateral mastoid effusions. Small left middle ear effusion. 3. Pansinus mucosal thickening with frothy secretions, which can be seen in the setting of acute sinusitis. Electronically Signed   By: Lorenza Cambridge M.D.   On: 12/09/2022 14:20    Labs: Basic Metabolic Panel: Recent Labs  Lab 12/09/22 1058 12/10/22 0308  NA 136 137  K 3.9 3.7  CL 105 106  CO2 22 23  GLUCOSE 136* 115*  BUN 22 18  CREATININE 1.27* 1.23  CALCIUM 8.9 8.5*   CBC: Recent Labs  Lab 12/09/22 1058  WBC 7.0  NEUTROABS 5.0  HGB 11.9*  HCT 36.7*  MCV 112.9*  PLT 176   Microbiology: Results for orders placed or performed during the hospital encounter of 12/09/22  SARS Coronavirus 2 by RT PCR (hospital order, performed in [facility] hospital lab) *cepheid single result test* Anterior Nasal Swab     Status: None   Collection Time: 12/09/22 12:28 PM   Specimen: Anterior Nasal Swab  Result Value Ref Range Status   SARS Coronavirus 2 by RT PCR NEGATIVE NEGATIVE Final    Comment: (NOTE) SARS-CoV-2 target nucleic acids are NOT DETECTED.  The SARS-CoV-2 RNA is generally detectable in upper and lower respiratory specimens during the acute phase of infection. The lowest concentration of SARS-CoV-2 viral copies this assay can detect is 250 copies / mL. A negative result does not preclude SARS-CoV-2 infection and should not be used as the sole basis for treatment or other patient management decisions.  A negative result may occur with improper specimen  collection / handling, submission of specimen other than nasopharyngeal swab, presence of viral mutation(s) within the areas targeted by this assay, and inadequate number of viral copies (<250 copies / mL). A negative result must be combined with clinical observations, patient history, and epidemiological information.  Fact Sheet for Patients:   RoadLapTop.co.za  Fact Sheet for Healthcare Providers: http://kim-miller.com/  This test is not yet approved or  cleared by the [place] FDA and has been authorized for detection and/or diagnosis of SARS-CoV-2 by FDA under an Emergency Use Authorization (EUA).  This EUA will remain in effect (meaning this test can be used) for the duration of the COVID-19 declaration under Section 564(b)(1) of the Act, 21 U.S.C. section 360bbb-3(b)(1), unless the authorization is terminated or revoked sooner.  Performed at [facility], [address] [place], [place] [address]    Time coordinating discharge: Over 30 minutes  Leeroy Bock, MD  [facility] 12/10/2022, 3:18 PM

## 2022-12-10 NOTE — TOC Transition Note (Signed)
Transition of Care [facility]) - CM/SW Discharge Note   Patient Details  Name: Keith Cruz MRN: [id number] Date of Birth: 1940/06/26  Transition [facility]) CM/SW Contact:  Darolyn Rua, LCSW Phone Number: 12/10/2022, 2:45 PM   Clinical Narrative:     Patient to dc home today, agreeable to [facility] PT, no preference of agency. Confirmed [name], NP is PCP, pharmacy is [facility].   CSW has reached out to [place] with [facility] for [facility] referral.   No other dc needs at this time.   Final next level of care: Home w Home Health Services Barriers to Discharge: No Barriers Identified   Patient Goals and CMS Choice CMS Medicare.gov Compare Post Acute Care list provided to:: Patient Choice offered to / list presented to : Patient  Discharge Placement                         Discharge Plan and Services Additional resources added to the After Visit Summary for                              [facility] Agency: [facility] Date [facility] Agency Contacted: 12/10/22   Representative spoke with at [facility] Agency: Kandee Keen  Social Determinants of Health (SDOH) Interventions SDOH Screenings   Food Insecurity: No Food Insecurity (12/09/2022)  Housing: Low Risk  (12/09/2022)  Transportation Needs: No Transportation Needs (12/09/2022)  Utilities: Not At Risk (12/09/2022)  Depression (PHQ2-9): Low Risk  (05/28/2022)  Financial Resource Strain: Low Risk  (05/19/2022)   Received from [facility], [facility]  Physical Activity: Insufficiently Active (02/14/2022)  Social Connections: Moderately Integrated (02/14/2022)  Stress: No Stress Concern Present (02/14/2022)  Tobacco Use: Medium Risk (12/09/2022)     Readmission Risk Interventions     No data to display

## 2022-12-10 NOTE — Evaluation (Signed)
Clinical/Bedside Swallow Evaluation Patient Details  Name: Keith Cruz MRN: [id number] Date of Birth: [birth year]  Today's Date: 12/10/2022 Time: SLP Start Time (ACUTE ONLY): 0940 SLP Stop Time (ACUTE ONLY): 1035 SLP Time Calculation (min) (ACUTE ONLY): 55 min  Past Medical History:  Past Medical History:  Diagnosis Date   Arthritis    back   Asthma    Atherosclerosis of aorta (HCC)    Atherosclerosis of aorta (HCC)    Bronchitis 05/2017   chronic   CAD (coronary artery disease)    Carotid artery disease (HCC)    Carotid stenosis    Carotid stenosis, bilateral    Chronic bronchitis (HCC)    Chronic cough    COPD (chronic obstructive pulmonary disease) (HCC)    Dysrhythmia    GERD (gastroesophageal reflux disease)    Heart murmur    HOH (hard of hearing)    hearing aids   Hypercholesteremia    Hypertension    Kidney disease    stent   Macular degeneration of left eye    Myocardial infarction (HCC) 2016   Pre-diabetes    Seasonal allergies    Seizure (HCC)    Seizures (HCC)    viral meningitis   Stroke [facility])    1991 and imaging 05/12/15 old strokes   TIA (transient ischemic attack) 1991   x2   Uses hearing aid    Viral meningitis 2017   Viral meningitis    Wears dentures    upper and lower   Past Surgical History:  Past Surgical History:  Procedure Laterality Date   CAROTID ENDARTERECTOMY Left 2014   CAROTID ENDARTERECTOMY Right 1991   CAROTID PTA/STENT INTERVENTION Left 05/31/2020   Procedure: CAROTID PTA/STENT INTERVENTIO;  Surgeon: Annice Needy, MD;  Location: [facility] INVASIVE CV LAB;  Service: Cardiovascular;  Laterality: Left;   CATARACT EXTRACTION W/PHACO Left 06/24/2017   Procedure: CATARACT EXTRACTION PHACO AND INTRAOCULAR LENS PLACEMENT (IOC) LEFT BORDERLINE DIABETIC;  Surgeon: Lockie Mola, MD;  Location: [facility];  Service: Ophthalmology;  Laterality: Left;   CATARACT EXTRACTION W/PHACO Right 07/22/2017   Procedure: CATARACT EXTRACTION  PHACO AND INTRAOCULAR LENS PLACEMENT (IOC) RIGHT BORDERLINE DIABETIC;  Surgeon: Lockie Mola, MD;  Location: [facility];  Service: Ophthalmology;  Laterality: Right;   CORONARY ARTERY BYPASS GRAFT     x5   SKIN LESION EXCISION Left    left ear   URETERAL STENT PLACEMENT     HPI:  Pt is a 82 y.o. male with a history of Multiple medical issues including CKD,  multiple vascular issues including CAD and carotid stenosis, GERD, seizures, viral meningitis, stroke in 1991, multiple myeloma, anemia, MDS, HOH has HAs but not wearing often, and COPD who presents with neurolyse weakness, acute onset around 9 AM when he was walking the dog.  The patient states that when he first got up and was walking around in his house he felt normal.  While walking his dog he felt very weak and lightheaded and had to crouch down and hold himself up.  He did not lose consciousness.  He started to have a sensation that everything was spinning.  Since that time he has been feeling weak and unable to stand or walk.  He denies any worsened weakness on one side or the other.  He has no headache.  He currently has no vision changes although does report that over the last month he has had episodes in which his vision goes white when he first goes  outside into the sunlight.  He has also had episodes of almost blacking out. Per Family report, concern for swallowing certain "foods" and "only able to swallow soft food, but this morning at breakfast patient had trouble to swallow his regular soft food after coming from walking the dog" -- pt stated this occurred during the midst of "not feeling well at all".   Head CT: No acute abnormality; Sequela of moderate chronic microvascular ischemic  change. Chronic infarct in the right parietal lobe. No mass effect. Pansinus mucosal thickening with frothy secretions, which can be  seen in the setting of acute sinusitis.   CXR: No acute cardiopulmonary disease.    Assessment / Plan /  Recommendation  Clinical Impression   Pt seen for BSE today. Pt awake, A/O x4. Verbal and tells great stories. Family present in room.  On RA, WBC wnl. Afebrile.   Pt appears to present w/ functional oropharyngeal phase swallowing w/ No oropharyngeal phase dysphagia noted, No sensorimotor deficits noted. Pt consumed po trials w/ No immediate, overt, clinical s/s of aspiration during po trials.  Pt appears at reduced risk for aspiration following general aspiration precautions.   OF NOTE: Family/pt stated they feel he has "trouble" swallowing certain foods; meats were highlighted. However, pt also lacks Bottom Dentition/Molars for effective mastication of solids(meats particularly). This in turn could impact his swallowing of such foods. Pt and Family denied any issues swallowing liquids and other foods, and the issue is NOT CONSISTENT; he and Family denied any trouble swallowing this morning w/ breakfast meal. NSG denied any trouble swallowing Pills(many at one time) w/ water.  Suspect the issue pt has w/ swallowing ("trouble w/ meats") could have more to do w/ lacking sufficient Dentition to masticate the foods fully b/f swallowing vs impact from a vascular procedure(felt initially). Pt and Wife agreed after thinking about this. Also updated them that his CXR was NEGATIVE indicating no pulmonary disease that would be seen if having chronic aspiration issues. Do NOT suspect pt is at any increased risk for aspiration/aspiration pneumonia re: his swallowing.   During po trials, pt consumed all consistencies w/ no overt coughing, decline in vocal quality, or change in respiratory presentation during/post trials. No decline in O2 sats. Oral phase appeared grossly [facility] w/ timely bolus management, mastication, and control of bolus propulsion for A-P transfer for swallowing. Oral clearing achieved w/ all trial consistencies -- moistened, soft foods w/ breakfast meal.  OM Exam appeared [facility] w/ no unilateral  weakness noted. Speech Clear. Pt fed self w/ setup.   Recommend continue a Regular consistency diet w/ well-Cut/Chopped meats, moistened foods; Thin liquids. Recommend general aspiration and REFLUX precautions d/t GERD baseline. Reduce distractions/talking during eating/drinking and always sit upright w/ all oral intake. Pills w/ liquids but 1-2 at a time is recommended.   Education given on Pills; food consistencies/prep of meats and easy to eat options; general aspiration precautions to pt and Family in room. NSG to reconsult if any new needs arise. NSG updated, agreed. MD updated. Recommend Dietician f/u for support d/t medical comorbidities. SLP Visit Diagnosis: Dysphagia, unspecified (R13.10) (unable to fully masticate tougher meats d/t lacking bottom Molars/Dentition)    Aspiration Risk   (reduced following general aspiration precs.)    Diet Recommendation   Thin;Age appropriate regular (chop meats/foods well for ease of chewing) = continue a Regular consistency diet w/ well-Cut/Chopped meats, moistened foods; Thin liquids. Recommend general aspiration and REFLUX precautions d/t GERD baseline. Reduce distractions/talking during eating/drinking and always  sit upright w/ all oral intake.   Medication Administration: Whole meds with liquid (1-2 at a time rec'd)    Other  Recommendations Recommended Consults:  (Dentist for Lower Partial plate replacement) Oral Care Recommendations: Oral care BID;Oral care before and after PO;Patient independent with oral care (Denture care)    Recommendations for follow up therapy are one component of a multi-disciplinary discharge planning process, led by the attending physician.  Recommendations may be updated based on patient status, additional functional criteria and insurance authorization.  Follow up Recommendations No SLP follow up      Assistance Recommended at Discharge  PRN  Functional Status Assessment Patient has had a recent decline in their  functional status and demonstrates the ability to make significant improvements in function in a reasonable and predictable amount of time. (feels better too)  Frequency and Duration  (n/a)   (n/a)       Prognosis Prognosis for improved oropharyngeal function: Good Barriers to Reach Goals: Time post onset;Severity of deficits (feels better now) Barriers/Prognosis Comment: lacks Bottom Dentition for effective mastication of meats/solid foods      Swallow Study   General Date of Onset: 12/09/22 HPI: Pt is a 82 y.o. male with a history of Multiple medical issues including CKD,  multiple vascular issues including CAD and carotid stenosis, GERD, seizures, viral meningitis, stroke in 1991, multiple myeloma, anemia, MDS, HOH has HAs but not wearing often, and COPD who presents with neurolyse weakness, acute onset around 9 AM when he was walking the dog.  The patient states that when he first got up and was walking around in his house he felt normal.  While walking his dog he felt very weak and lightheaded and had to crouch down and hold himself up.  He did not lose consciousness.  He started to have a sensation that everything was spinning.  Since that time he has been feeling weak and unable to stand or walk.  He denies any worsened weakness on one side or the other.  He has no headache.  He currently has no vision changes although does report that over the last month he has had episodes in which his vision goes white when he first goes outside into the sunlight.  He has also had episodes of almost blacking out. Per Family report, concern for swallowing certain "foods" and "only able to swallow soft food, but this morning at breakfast patient had trouble to swallow his regular soft food after coming from walking the dog" -- pt stated this occurred during the midst of "not feeling well at all".   Head CT: No acute abnormality; Sequela of moderate chronic microvascular ischemic  change. Chronic infarct in the  right parietal lobe. No mass effect. Pansinus mucosal thickening with frothy secretions, which can be  seen in the setting of acute sinusitis.   CXR: No acute cardiopulmonary disease. Type of Study: Bedside Swallow Evaluation Previous Swallow Assessment: none Diet Prior to this Study: Regular;Thin liquids (Level 0) Temperature Spikes Noted: No (wbc 7.0) Respiratory Status: Room air History of Recent Intubation: No Behavior/Cognition: Alert;Cooperative;Pleasant mood Oral Cavity Assessment: Within Functional Limits Oral Care Completed by SLP: Recent completion by staff Oral Cavity - Dentition: Dentures, top (few anterior teeth only) Self-Feeding Abilities: Able to feed self;Needs set up Patient Positioning: Upright in bed (needs support w/ full upright sitting) Baseline Vocal Quality: Normal Volitional Cough: Strong Volitional Swallow: Able to elicit    Oral/Motor/Sensory Function Overall Oral Motor/Sensory Function: Within functional limits  Ice Chips Ice chips: Not tested   Thin Liquid Thin Liquid: Within functional limits Presentation: Cup;Self Fed (4-5 sips; ~4 ozs during breakfast meal) Other Comments: also swallowed MANY Pills w/ water w/ NSG this AM - no deficits per NSG report    Nectar Thick Nectar Thick Liquid: Not tested   Honey Thick Honey Thick Liquid: Not tested   Puree Puree: Not tested   Solid     Solid: Within functional limits Presentation: Self Fed;Spoon (breakfast meal items to include egg and biscuit)        Jerilynn Som, MS, CCC-SLP Speech Language Pathologist Rehab Services; [facility] [phone] (ascom) , 12/10/2022,11:27 AM

## 2022-12-10 NOTE — Evaluation (Signed)
Physical Therapy Evaluation Patient Details Name: Keith Cruz MRN: [id number] DOB: [birth year] Today's Date: 12/10/2022  History of Present Illness  82 y.o. male with medical history significant of CAD s/p CABG, bilateral carotid artery stenosis status post stenting on left side and endarterectomy on right side, multiple myeloma on active surveillance, COPD, CKD stage IIIa, PAF on Eliquis, presented with recurrent of near syncope's.     Symptoms started 2 to 3 weeks ago.  Frequent episodes of near syncope which patient described that " every time when I go outside see bright sunlight started feeling lightheaded and feeling about blackout" and patient had to sit down or lie down for few minutes to let episode pass and occasionally he does feel palpitation  Clinical Impression  Pt pleasant and eager to work with PT.  He did endorse short term vertigo as a symptoms but does state episodes are not usually truly room spinning or positionally related; tested (-) b/l Dix-Hallpike.  Pt was able to circumambulate the nurses' station initially with walker, but final ~150 ft w/o UEs.  He had slow but safe, forward flexed posture that is not far from his described baseline.  PT encouraging use of AD when he goes home due to back pain, general low grade unsteadiness - pt reports he is feeling good and is very eager to return home.  He will benefit from continued PT to address functional limitations.        If plan is discharge home, recommend the following: Assistance with cooking/housework;Assist for transportation   Can travel by private vehicle        Equipment Recommendations None recommended by PT  Recommendations for Other Services       Functional Status Assessment Patient has had a recent decline in their functional status and demonstrates the ability to make significant improvements in function in a reasonable and predictable amount of time.     Precautions / Restrictions  Precautions Precautions: Fall Restrictions Weight Bearing Restrictions: No      Mobility  Bed Mobility Overal bed mobility: Modified Independent             General bed mobility comments: Pt able to transition to sitting EOB w/o assist    Transfers Overall transfer level: Needs assistance Equipment used: Rolling walker (2 wheels), None Transfers: Sit to/from Stand Sit to Stand: Contact guard assist           General transfer comment: Pt able to rise to standing multiple times from standard height bed and recliner - appropriate UE use, no assist needed    Ambulation/Gait Ambulation/Gait assistance: Supervision Gait Distance (Feet): 200 Feet Assistive device: Rolling walker (2 wheels), None         General Gait Details: Pt with forward flexed/lumbar pain posture with shortened step length;  He and son report this as baseline.  ~50 ft with walker and 150 ft w/o AD/UEs.  Pt able to maintain slow but safe gait, w/o LOBs or overt safety issues.  Pt's HR and O2 remains stable and appropriate t/o the effort.  Stairs            Wheelchair Mobility     Tilt Bed    Modified Rankin (Stroke Patients Only)       Balance Overall balance assessment: Mild deficits observed, not formally tested  Pertinent Vitals/Pain Pain Assessment Pain Assessment: 0-10 Pain Score: 5  Pain Location: chronic low back pain, was pleasantly surprised that he did not have increased pain with prolonged ambulation.    Home Living Family/patient expects to be discharged to:: Private residence Living Arrangements: Spouse/significant other Available Help at Discharge: Family;Available 24 hours/day Type of Home: House Home Access: Ramped entrance       Home Layout: One level Home Equipment: Agricultural consultant (2 wheels);Cane - single point      Prior Function Prior Level of Function : Independent/Modified Independent              Mobility Comments: Pt apparently walks his dog >QD, minimal driving, uses walker or cane occsionally but not consistently       Extremity/Trunk Assessment   Upper Extremity Assessment Upper Extremity Assessment: Overall WFL for tasks assessed    Lower Extremity Assessment Lower Extremity Assessment: Overall WFL for tasks assessed;Generalized weakness       Communication   Communication Communication: No apparent difficulties  Cognition Arousal: Alert Behavior During Therapy: Impulsive Overall Cognitive Status: Within Functional Limits for tasks assessed                                          General Comments General comments (skin integrity, edema, etc.): (-) Dix-Hallpike testing b/l for BPPV    Exercises     Assessment/Plan    PT Assessment Patient needs continued PT services  PT Problem List Decreased activity tolerance;Decreased strength;Decreased balance;Decreased safety awareness;Pain       PT Treatment Interventions DME instruction;Functional mobility training;Gait training;Therapeutic activities;Therapeutic exercise;Balance training;Patient/family education    PT Goals (Current goals can be found in the Care Plan section)  Acute Rehab PT Goals Patient Stated Goal: Go home today PT Goal Formulation: With patient/family Time For Goal Achievement: 12/23/22 Potential to Achieve Goals: Good    Frequency Min 1X/week     Co-evaluation               AM-PAC PT "6 Clicks" Mobility  Outcome Measure Help needed turning from your back to your side while in a flat bed without using bedrails?: None Help needed moving from lying on your back to sitting on the side of a flat bed without using bedrails?: None Help needed moving to and from a bed to a chair (including a wheelchair)?: A Little Help needed standing up from a chair using your arms (e.g., wheelchair or bedside chair)?: A Little Help needed to walk in hospital room?: A  Little Help needed climbing 3-5 steps with a railing? : A Little 6 Click Score: 20    End of Session Equipment Utilized During Treatment: Gait belt Activity Tolerance: Patient tolerated treatment well Patient left: in chair;with call bell/phone within reach;with family/visitor present Nurse Communication: Mobility status PT Visit Diagnosis: Difficulty in walking, not elsewhere classified (R26.2);Unsteadiness on feet (R26.81);Muscle weakness (generalized) (M62.81)    Time: 1013-1040 PT Time Calculation (min) (ACUTE ONLY): 27 min   Charges:   PT Evaluation $PT Eval Low Complexity: 1 Low PT Treatments $Gait Training: 8-22 mins PT General Charges $$ ACUTE PT VISIT: 1 Visit         Malachi Pro, DPT 12/10/2022, 1:26 PM

## 2022-12-10 NOTE — Progress Notes (Signed)
*  PRELIMINARY RESULTS* Echocardiogram 2D Echocardiogram has been performed.  Cristela Blue 12/10/2022, 12:18 PM

## 2022-12-11 ENCOUNTER — Other Ambulatory Visit: Payer: Self-pay | Admitting: Nurse Practitioner

## 2022-12-11 DIAGNOSIS — L259 Unspecified contact dermatitis, unspecified cause: Secondary | ICD-10-CM

## 2022-12-13 DIAGNOSIS — E860 Dehydration: Secondary | ICD-10-CM | POA: Diagnosis not present

## 2022-12-13 DIAGNOSIS — R000 Tachycardia, unspecified: Secondary | ICD-10-CM | POA: Diagnosis not present

## 2022-12-13 DIAGNOSIS — W19XXXA Unspecified fall, initial encounter: Secondary | ICD-10-CM | POA: Diagnosis not present

## 2022-12-13 DIAGNOSIS — I959 Hypotension, unspecified: Secondary | ICD-10-CM | POA: Diagnosis not present

## 2022-12-13 DIAGNOSIS — I447 Left bundle-branch block, unspecified: Secondary | ICD-10-CM | POA: Diagnosis not present

## 2022-12-15 DIAGNOSIS — N1831 Chronic kidney disease, stage 3a: Secondary | ICD-10-CM | POA: Diagnosis not present

## 2022-12-15 DIAGNOSIS — C9000 Multiple myeloma not having achieved remission: Secondary | ICD-10-CM | POA: Diagnosis not present

## 2022-12-15 DIAGNOSIS — D631 Anemia in chronic kidney disease: Secondary | ICD-10-CM | POA: Diagnosis not present

## 2022-12-15 DIAGNOSIS — I129 Hypertensive chronic kidney disease with stage 1 through stage 4 chronic kidney disease, or unspecified chronic kidney disease: Secondary | ICD-10-CM | POA: Diagnosis not present

## 2022-12-15 DIAGNOSIS — I480 Paroxysmal atrial fibrillation: Secondary | ICD-10-CM | POA: Diagnosis not present

## 2022-12-15 NOTE — Consult Note (Signed)
CARDIOLOGY CONSULT NOTE               Patient ID: Keith Cruz MRN: [id number] DOB/AGE: April 13, 1941 82 y.o.  Admit date: 12/09/2022 Referring Physician Dr. Mikey College hospitalist Primary Physician [name], NP Primary Cardiologist: [facility] Reason for Consultation syncope  HPI: 82 year old with history of coronary artery disease coronary bypass surgery peripheral vascular disease with carotid artery stenosis status post stenting endarterectomy multiple myeloma COPD renal insufficiency paroxysmal atrial fibrillation on anticoagulation with Eliquis presented with recurrent syncopal episodes.  Patient states symptoms started 2 to 3 weeks ago with frequent episode of near syncope he felt every time he got up suddenly started feeling lightheaded and feeling about to pass out had to sit down or lie down multiple times.  Patient does feel palpitations feels like there is a drop or skipping heartbeat no real chest pain no swelling no diaphoresis no nausea vomiting blood pressure has been reasonably stable above 150 systolic and heart rates with stable with no hypoxemia with recurrent syncopal illness of the episode he was brought to the emergency room and admitted for further evaluation and management  Review of systems complete and found to be negative unless listed above     Past Medical History:  Diagnosis Date   Arthritis    back   Asthma    Atherosclerosis of aorta (HCC)    Atherosclerosis of aorta (HCC)    Bronchitis 05/2017   chronic   CAD (coronary artery disease)    Carotid artery disease (HCC)    Carotid stenosis    Carotid stenosis, bilateral    Chronic bronchitis (HCC)    Chronic cough    COPD (chronic obstructive pulmonary disease) (HCC)    Dysrhythmia    GERD (gastroesophageal reflux disease)    Heart murmur    HOH (hard of hearing)    hearing aids   Hypercholesteremia    Hypertension    Kidney disease    stent   Macular degeneration of left eye     Myocardial infarction (HCC) 2016   Pre-diabetes    Seasonal allergies    Seizure (HCC)    Seizures (HCC)    viral meningitis   Stroke [facility])    1991 and imaging 05/12/15 old strokes   TIA (transient ischemic attack) 1991   x2   Uses hearing aid    Viral meningitis 2017   Viral meningitis    Wears dentures    upper and lower    Past Surgical History:  Procedure Laterality Date   CAROTID ENDARTERECTOMY Left 2014   CAROTID ENDARTERECTOMY Right 1991   CAROTID PTA/STENT INTERVENTION Left 05/31/2020   Procedure: CAROTID PTA/STENT INTERVENTIO;  Surgeon: Annice Needy, MD;  Location: [facility] INVASIVE CV LAB;  Service: Cardiovascular;  Laterality: Left;   CATARACT EXTRACTION W/PHACO Left 06/24/2017   Procedure: CATARACT EXTRACTION PHACO AND INTRAOCULAR LENS PLACEMENT (IOC) LEFT BORDERLINE DIABETIC;  Surgeon: Lockie Mola, MD;  Location: [facility];  Service: Ophthalmology;  Laterality: Left;   CATARACT EXTRACTION W/PHACO Right 07/22/2017   Procedure: CATARACT EXTRACTION PHACO AND INTRAOCULAR LENS PLACEMENT (IOC) RIGHT BORDERLINE DIABETIC;  Surgeon: Lockie Mola, MD;  Location: [facility];  Service: Ophthalmology;  Laterality: Right;   CORONARY ARTERY BYPASS GRAFT     x5   SKIN LESION EXCISION Left    left ear   URETERAL STENT PLACEMENT      No medications prior to admission.   Social History   Socioeconomic History  Marital status: Married    Spouse name: Not on file   Number of children: Not on file   Years of education: Not on file   Highest education level: 10th grade  Occupational History   Not on file  Tobacco Use   Smoking status: Former    Current packs/day: 0.00    Average packs/day: 2.0 packs/day for 10.0 years (20.0 ttl pk-yrs)    Types: Cigarettes    Start date: 04/28/1960    Quit date: 04/28/1970    Years since quitting: 52.6   Smokeless tobacco: Former  Building services engineer status: Never Used  Substance and Sexual Activity   Alcohol use:  No    Alcohol/week: 0.0 standard drinks of alcohol    Comment: previous   Drug use: No   Sexual activity: Yes    Comment: wife   Other Topics Concern   Not on file  Social History Narrative   Re-married x 3 years wife named Audiological scientist pets, 1 dog.    Retired    Investment banker, operational, wears seat belt, safe in relationship    2 sons and 1 daughter    6 years [employer]    Social Determinants of Health   Financial Resource Strain: Low Risk  (05/19/2022)   Received from [facility]   Overall Financial Resource Strain (CARDIA)    Difficulty of Paying Living Expenses: Not hard at all  Food Insecurity: No Food Insecurity (12/09/2022)   Hunger Vital Sign    Worried About Running Out of Food in the Last Year: Never true    Ran Out of Food in the Last Year: Never true  Transportation Needs: No Transportation Needs (12/09/2022)   PRAPARE - Administrator, Civil Service (Medical): No    Lack of Transportation (Non-Medical): No  Physical Activity: Insufficiently Active (02/14/2022)   Exercise Vital Sign    Days of Exercise per Week: 2 days    Minutes of Exercise per Session: 20 min  Stress: No Stress Concern Present (02/14/2022)   [employer] of Occupational Health - Occupational Stress Questionnaire    Feeling of Stress : Not at all  Social Connections: Moderately Integrated (02/14/2022)   Social Connection and Isolation Panel [NHANES]    Frequency of Communication with Friends and Family: More than three times a week    Frequency of Social Gatherings with Friends and Family: More than three times a week    Attends Religious Services: More than 4 times per year    Active Member of [employer] or Organizations: No    Attends Banker Meetings: Never    Marital Status: Married  Catering manager Violence: Not At Risk (12/09/2022)   Humiliation, Afraid, Rape, and Kick questionnaire    Fear of Current or Ex-Partner: No    Emotionally Abused: No     Physically Abused: No    Sexually Abused: No    Family History  Problem Relation Age of Onset   Stroke Mother    Heart attack Mother    Dementia Mother    Stroke Father    Stroke Sister    Alzheimer's disease Sister    Dementia Sister    Dementia Sister    Stroke Brother    Alzheimer's disease Maternal Grandmother    Dementia Maternal Grandmother    Stroke Paternal Grandmother    Stroke Paternal Grandfather    Alzheimer's disease Paternal Grandfather    Cancer Neg  Hx       Review of systems complete and found to be negative unless listed above      PHYSICAL EXAM  General: Well developed, well nourished, in no acute distress HEENT:  Normocephalic and atramatic Neck:  No JVD.  Lungs: Clear bilaterally to auscultation and percussion. Heart: HRRR . Normal S1 and S2 without gallops or murmurs.  Abdomen: Bowel sounds are positive, abdomen soft and non-tender  Msk:  Back normal, normal gait. Normal strength and tone for age. Extremities: No clubbing, cyanosis or edema.   Neuro: Alert and oriented X 3. Psych:  Good affect, responds appropriately  Labs:   Lab Results  Component Value Date   WBC 7.0 12/09/2022   HGB 11.9 (L) 12/09/2022   HCT 36.7 (L) 12/09/2022   MCV 112.9 (H) 12/09/2022   PLT 176 12/09/2022    Recent Labs  Lab 12/09/22 1058 12/10/22 0308  NA 136 137  K 3.9 3.7  CL 105 106  CO2 22 23  BUN 22 18  CREATININE 1.27* 1.23  CALCIUM 8.9 8.5*  PROT 7.8  --   BILITOT 0.7  --   ALKPHOS 49  --   ALT 20  --   AST 29  --   GLUCOSE 136* 115*   Lab Results  Component Value Date   CKTOTAL 265 10/01/2013   CKMB 15.1 (H) 10/02/2013   TROPONINI 5.20 (H) 10/01/2013    Lab Results  Component Value Date   CHOL 89 11/12/2022   CHOL 101 05/13/2022   CHOL 92 03/13/2021   Lab Results  Component Value Date   HDL 38.00 (L) 11/12/2022   HDL 34.30 (L) 05/13/2022   HDL 35.90 (L) 03/13/2021   Lab Results  Component Value Date   LDLCALC 37 11/12/2022    LDLCALC 39 05/13/2022   LDLCALC 43 03/13/2021   Lab Results  Component Value Date   TRIG 71.0 11/12/2022   TRIG 136.0 05/13/2022   TRIG 68.0 03/13/2021   Lab Results  Component Value Date   CHOLHDL 2 11/12/2022   CHOLHDL 3 05/13/2022   CHOLHDL 3 03/13/2021   No results found for: "LDLDIRECT"    Radiology: ECHOCARDIOGRAM COMPLETE  Result Date: 12/11/2022    ECHOCARDIOGRAM REPORT   Patient Name:   TYQUAN EILERS Date of Exam: 12/10/2022 Medical Rec #:  [id number]      Height:       76.0 in Accession #:    [id number]     Weight:       205.5 lb Date of Birth:  [birth year]       BSA:          2.241 m Patient Age:    82 years       BP:           141/66 mmHg Patient Gender: M              HR:           74 bpm. Exam Location:  [facility] Procedure: 2D Echo, Color Doppler and Cardiac Doppler Indications:     Syncope R55  History:         Patient has no prior history of Echocardiogram examinations.                  CAD, Stroke; Risk Factors:Hypertension.  Sonographer:     Cristela Blue Referring Phys:  [id number] LILY MICHELLE TANG Diagnosing Phys: Alwyn Pea MD IMPRESSIONS  1. Left ventricular ejection fraction,  by estimation, is 40 to 45%. The left ventricle has mildly decreased function. The left ventricle demonstrates global hypokinesis. There is mild concentric left ventricular hypertrophy. Left ventricular diastolic parameters are consistent with Grade I diastolic dysfunction (impaired relaxation).  2. Right ventricular systolic function is low normal. The right ventricular size is mildly enlarged.  3. The mitral valve is normal in structure. Trivial mitral valve regurgitation.  4. The aortic valve is normal in structure. Aortic valve regurgitation is trivial. FINDINGS  Left Ventricle: Left ventricular ejection fraction, by estimation, is 40 to 45%. The left ventricle has mildly decreased function. The left ventricle demonstrates global hypokinesis. The left ventricular internal cavity size was normal in size.  There is  mild concentric left ventricular hypertrophy. Abnormal (paradoxical) septal motion, consistent with left bundle branch block. Left ventricular diastolic parameters are consistent with Grade I diastolic dysfunction (impaired relaxation). Right Ventricle: The right ventricular size is mildly enlarged. No increase in right ventricular wall thickness. Right ventricular systolic function is low normal. Left Atrium: Left atrial size was normal in size. Right Atrium: Right atrial size was normal in size. Pericardium: There is no evidence of pericardial effusion. Mitral Valve: The mitral valve is normal in structure. Trivial mitral valve regurgitation. Tricuspid Valve: The tricuspid valve is normal in structure. Tricuspid valve regurgitation is mild. Aortic Valve: The aortic valve is normal in structure. Aortic valve regurgitation is trivial. Aortic valve mean gradient measures 3.0 mmHg. Aortic valve peak gradient measures 4.8 mmHg. Aortic valve area, by VTI measures 1.72 cm. Pulmonic Valve: The pulmonic valve was normal in structure. Pulmonic valve regurgitation is not visualized. Aorta: The ascending aorta was not well visualized. IAS/Shunts: No atrial level shunt detected by color flow Doppler.  LEFT VENTRICLE PLAX 2D LVIDd:         4.50 cm     Diastology LVIDs:         3.60 cm     LV e' medial:    5.77 cm/s LV PW:         1.30 cm     LV E/e' medial:  10.9 LV IVS:        1.30 cm     LV e' lateral:   6.96 cm/s LVOT diam:     2.00 cm     LV E/e' lateral: 9.0 LV SV:         39 LV SV Index:   17 LVOT Area:     3.14 cm  LV Volumes (MOD) LV vol d, MOD A2C: 92.5 ml LV vol d, MOD A4C: 87.3 ml LV vol s, MOD A2C: 42.2 ml LV vol s, MOD A4C: 54.6 ml LV SV MOD A2C:     50.3 ml LV SV MOD A4C:     87.3 ml LV SV MOD BP:      45.1 ml RIGHT VENTRICLE RV Basal diam:  3.40 cm RV Mid diam:    3.50 cm LEFT ATRIUM           Index        RIGHT ATRIUM           Index LA diam:      3.50 cm 1.56 cm/m   RA Area:     14.20 cm LA Vol  (A2C): 41.7 ml 18.61 ml/m  RA Volume:   28.30 ml  12.63 ml/m LA Vol (A4C): 41.7 ml 18.61 ml/m  AORTIC VALVE AV Area (Vmax):    1.95 cm AV Area (Vmean):   2.03 cm  AV Area (VTI):     1.72 cm AV Vmax:           109.15 cm/s AV Vmean:          78.650 cm/s AV VTI:            0.224 m AV Peak Grad:      4.8 mmHg AV Mean Grad:      3.0 mmHg LVOT Vmax:         67.90 cm/s LVOT Vmean:        50.800 cm/s LVOT VTI:          0.123 m LVOT/AV VTI ratio: 0.55  AORTA Ao Root diam: 3.00 cm MITRAL VALVE               TRICUSPID VALVE MV Area (PHT): 3.07 cm    TR Peak grad:   10.5 mmHg MV Decel Time: 247 msec    TR Vmax:        162.00 cm/s MV E velocity: 62.90 cm/s MV A velocity: 80.20 cm/s  SHUNTS MV E/A ratio:  0.78        Systemic VTI:  0.12 m                            Systemic Diam: 2.00 cm Alwyn Pea MD Electronically signed by Alwyn Pea MD Signature Date/Time: 12/11/2022/5:02:45 PM    Final    X-ray chest PA and lateral  Result Date: 12/09/2022 CLINICAL DATA:  Near-syncope EXAM: CHEST - 2 VIEW COMPARISON:  X-ray 02/19/2021 FINDINGS: Sternal wires. Hyperinflation. No consolidation, pneumothorax or effusion. No edema overlapping cardiac leads. Normal cardiopericardial silhouette IMPRESSION: Postop chest with hyperinflation.  No acute cardiopulmonary disease Electronically Signed   By: Karen Kays M.D.   On: 12/09/2022 16:42   CT Head Wo Contrast  Result Date: 12/09/2022 CLINICAL DATA:  Neuro deficit, acute, stroke suspected EXAM: CT HEAD WITHOUT CONTRAST TECHNIQUE: Contiguous axial images were obtained from the base of the skull through the vertex without intravenous contrast. RADIATION DOSE REDUCTION: This exam was performed according to the departmental dose-optimization program which includes automated exposure control, adjustment of the mA and/or kV according to patient size and/or use of iterative reconstruction technique. COMPARISON:  CT Head 05/11/18 FINDINGS: Brain: No hemorrhage. No  hydrocephalus. No extra-axial fluid collection. Sequela of moderate chronic microvascular ischemic change. Chronic infarct in the right parietal lobe. No mass effect. No mass lesion. Vascular: No hyperdense vessel or unexpected calcification. Skull: Normal. Negative for fracture or focal lesion. Sinuses/Orbits: Moderate bilateral mastoid effusions. Small left middle ear effusion. Pansinus mucosal thickening with frothy secretions, which can be seen in the setting of acute sinusitis. Bilateral lens replacement. Orbits are otherwise unremarkable. Other: None. IMPRESSION: 1. No acute intracranial abnormality. 2. Moderate bilateral mastoid effusions. Small left middle ear effusion. 3. Pansinus mucosal thickening with frothy secretions, which can be seen in the setting of acute sinusitis. Electronically Signed   By: Lorenza Cambridge M.D.   On: 12/09/2022 14:20    EKG: Normal sinus rhythm rate of 80 left bundle branch block nonspecific ST-T wave changes  ASSESSMENT AND PLAN:     Impression Possibly vasovagal syncope Doubt cardiac etiology of syncope History of known coronary artery disease Peripheral vascular disease Obesity History of coronary bypass surgery Hypertension COPD Multiple myeloma Bilateral carotid stenosis  chronic renal insufficiency Atrial fibrillation   Plan Recommend conservative management Maintain adequate hydration Consider support stockings Do not  recommend any invasive procedures at this stage Consider follow-up with cardiology as an outpatient 1 to 2 weeks  Signed: Alwyn Pea MD 12/15/2022, 8:41 AM

## 2022-12-23 DIAGNOSIS — R270 Ataxia, unspecified: Secondary | ICD-10-CM | POA: Diagnosis not present

## 2022-12-23 DIAGNOSIS — I10 Essential (primary) hypertension: Secondary | ICD-10-CM | POA: Diagnosis not present

## 2022-12-23 DIAGNOSIS — Z9889 Other specified postprocedural states: Secondary | ICD-10-CM | POA: Diagnosis not present

## 2022-12-23 DIAGNOSIS — J439 Emphysema, unspecified: Secondary | ICD-10-CM | POA: Diagnosis not present

## 2022-12-23 DIAGNOSIS — Z951 Presence of aortocoronary bypass graft: Secondary | ICD-10-CM | POA: Diagnosis not present

## 2022-12-23 DIAGNOSIS — I6523 Occlusion and stenosis of bilateral carotid arteries: Secondary | ICD-10-CM | POA: Diagnosis not present

## 2022-12-23 DIAGNOSIS — I2510 Atherosclerotic heart disease of native coronary artery without angina pectoris: Secondary | ICD-10-CM | POA: Diagnosis not present

## 2022-12-23 DIAGNOSIS — R42 Dizziness and giddiness: Secondary | ICD-10-CM | POA: Diagnosis not present

## 2022-12-23 DIAGNOSIS — N1831 Chronic kidney disease, stage 3a: Secondary | ICD-10-CM | POA: Diagnosis not present

## 2022-12-23 DIAGNOSIS — Z8679 Personal history of other diseases of the circulatory system: Secondary | ICD-10-CM | POA: Diagnosis not present

## 2022-12-23 DIAGNOSIS — E785 Hyperlipidemia, unspecified: Secondary | ICD-10-CM | POA: Diagnosis not present

## 2022-12-24 DIAGNOSIS — C9000 Multiple myeloma not having achieved remission: Secondary | ICD-10-CM | POA: Diagnosis not present

## 2022-12-24 DIAGNOSIS — I129 Hypertensive chronic kidney disease with stage 1 through stage 4 chronic kidney disease, or unspecified chronic kidney disease: Secondary | ICD-10-CM | POA: Diagnosis not present

## 2022-12-24 DIAGNOSIS — I480 Paroxysmal atrial fibrillation: Secondary | ICD-10-CM | POA: Diagnosis not present

## 2022-12-24 DIAGNOSIS — N1831 Chronic kidney disease, stage 3a: Secondary | ICD-10-CM | POA: Diagnosis not present

## 2022-12-24 DIAGNOSIS — D631 Anemia in chronic kidney disease: Secondary | ICD-10-CM | POA: Diagnosis not present

## 2022-12-29 ENCOUNTER — Encounter: Payer: Self-pay | Admitting: Nurse Practitioner

## 2022-12-29 ENCOUNTER — Other Ambulatory Visit: Payer: Self-pay | Admitting: Student in an Organized Health Care Education/Training Program

## 2022-12-29 DIAGNOSIS — I10 Essential (primary) hypertension: Secondary | ICD-10-CM

## 2022-12-29 DIAGNOSIS — K219 Gastro-esophageal reflux disease without esophagitis: Secondary | ICD-10-CM

## 2022-12-30 MED ORDER — PANTOPRAZOLE SODIUM 40 MG PO TBEC
40.0000 mg | DELAYED_RELEASE_TABLET | Freq: Two times a day (BID) | ORAL | 3 refills | Status: DC
Start: 2022-12-30 — End: 2023-02-26

## 2022-12-30 MED ORDER — METOPROLOL TARTRATE 50 MG PO TABS: 50.0000 mg | ORAL_TABLET | Freq: Two times a day (BID) | ORAL | 3 refills | Status: DC

## 2022-12-30 MED ORDER — HYDRALAZINE HCL 50 MG PO TABS
50.0000 mg | ORAL_TABLET | Freq: Three times a day (TID) | ORAL | 3 refills | Status: DC
Start: 2022-12-30 — End: 2023-10-20

## 2022-12-30 MED ORDER — ATORVASTATIN CALCIUM 80 MG PO TABS: 80.0000 mg | ORAL_TABLET | Freq: Every day | ORAL | 3 refills | Status: DC

## 2023-01-01 ENCOUNTER — Telehealth: Payer: Self-pay

## 2023-01-01 DIAGNOSIS — M5136 Other intervertebral disc degeneration, lumbar region: Secondary | ICD-10-CM | POA: Diagnosis not present

## 2023-01-01 DIAGNOSIS — M48062 Spinal stenosis, lumbar region with neurogenic claudication: Secondary | ICD-10-CM | POA: Diagnosis not present

## 2023-01-01 DIAGNOSIS — M48061 Spinal stenosis, lumbar region without neurogenic claudication: Secondary | ICD-10-CM | POA: Insufficient documentation

## 2023-01-01 DIAGNOSIS — M5451 Vertebrogenic low back pain: Secondary | ICD-10-CM | POA: Diagnosis not present

## 2023-01-01 NOTE — Telephone Encounter (Signed)
Forms have been faxed back to [phone] with a completed transmission log

## 2023-01-01 NOTE — Telephone Encounter (Signed)
Plan of care forms have been placed in provider to be signed folder

## 2023-01-02 DIAGNOSIS — I129 Hypertensive chronic kidney disease with stage 1 through stage 4 chronic kidney disease, or unspecified chronic kidney disease: Secondary | ICD-10-CM | POA: Diagnosis not present

## 2023-01-02 DIAGNOSIS — D631 Anemia in chronic kidney disease: Secondary | ICD-10-CM | POA: Diagnosis not present

## 2023-01-02 DIAGNOSIS — C9000 Multiple myeloma not having achieved remission: Secondary | ICD-10-CM | POA: Diagnosis not present

## 2023-01-02 DIAGNOSIS — N1831 Chronic kidney disease, stage 3a: Secondary | ICD-10-CM | POA: Diagnosis not present

## 2023-01-02 DIAGNOSIS — I480 Paroxysmal atrial fibrillation: Secondary | ICD-10-CM | POA: Diagnosis not present

## 2023-01-07 DIAGNOSIS — I480 Paroxysmal atrial fibrillation: Secondary | ICD-10-CM | POA: Diagnosis not present

## 2023-01-07 DIAGNOSIS — I129 Hypertensive chronic kidney disease with stage 1 through stage 4 chronic kidney disease, or unspecified chronic kidney disease: Secondary | ICD-10-CM | POA: Diagnosis not present

## 2023-01-07 DIAGNOSIS — D631 Anemia in chronic kidney disease: Secondary | ICD-10-CM | POA: Diagnosis not present

## 2023-01-07 DIAGNOSIS — C9000 Multiple myeloma not having achieved remission: Secondary | ICD-10-CM | POA: Diagnosis not present

## 2023-01-07 DIAGNOSIS — N1831 Chronic kidney disease, stage 3a: Secondary | ICD-10-CM | POA: Diagnosis not present

## 2023-01-09 ENCOUNTER — Other Ambulatory Visit: Payer: Self-pay | Admitting: Nurse Practitioner

## 2023-01-09 DIAGNOSIS — L259 Unspecified contact dermatitis, unspecified cause: Secondary | ICD-10-CM

## 2023-01-14 DIAGNOSIS — D631 Anemia in chronic kidney disease: Secondary | ICD-10-CM | POA: Diagnosis not present

## 2023-01-14 DIAGNOSIS — I129 Hypertensive chronic kidney disease with stage 1 through stage 4 chronic kidney disease, or unspecified chronic kidney disease: Secondary | ICD-10-CM | POA: Diagnosis not present

## 2023-01-14 DIAGNOSIS — N1831 Chronic kidney disease, stage 3a: Secondary | ICD-10-CM | POA: Diagnosis not present

## 2023-01-14 DIAGNOSIS — I480 Paroxysmal atrial fibrillation: Secondary | ICD-10-CM | POA: Diagnosis not present

## 2023-01-14 DIAGNOSIS — C9000 Multiple myeloma not having achieved remission: Secondary | ICD-10-CM | POA: Diagnosis not present

## 2023-01-21 DIAGNOSIS — D631 Anemia in chronic kidney disease: Secondary | ICD-10-CM | POA: Diagnosis not present

## 2023-01-21 DIAGNOSIS — I480 Paroxysmal atrial fibrillation: Secondary | ICD-10-CM | POA: Diagnosis not present

## 2023-01-21 DIAGNOSIS — N1831 Chronic kidney disease, stage 3a: Secondary | ICD-10-CM | POA: Diagnosis not present

## 2023-01-21 DIAGNOSIS — C9000 Multiple myeloma not having achieved remission: Secondary | ICD-10-CM | POA: Diagnosis not present

## 2023-01-21 DIAGNOSIS — I129 Hypertensive chronic kidney disease with stage 1 through stage 4 chronic kidney disease, or unspecified chronic kidney disease: Secondary | ICD-10-CM | POA: Diagnosis not present

## 2023-01-22 ENCOUNTER — Other Ambulatory Visit: Payer: Self-pay | Admitting: Student

## 2023-01-22 DIAGNOSIS — R531 Weakness: Secondary | ICD-10-CM | POA: Diagnosis not present

## 2023-01-22 DIAGNOSIS — R413 Other amnesia: Secondary | ICD-10-CM

## 2023-01-22 DIAGNOSIS — A530 Latent syphilis, unspecified as early or late: Secondary | ICD-10-CM | POA: Insufficient documentation

## 2023-01-22 DIAGNOSIS — R42 Dizziness and giddiness: Secondary | ICD-10-CM

## 2023-01-22 DIAGNOSIS — R270 Ataxia, unspecified: Secondary | ICD-10-CM | POA: Diagnosis not present

## 2023-01-26 ENCOUNTER — Ambulatory Visit
Admission: RE | Admit: 2023-01-26 | Discharge: 2023-01-26 | Disposition: A | Discharge status: Home / Self Care | Payer: Medicare Other | Source: Ambulatory Visit | Attending: Student | Admitting: Student

## 2023-01-26 DIAGNOSIS — R413 Other amnesia: Secondary | ICD-10-CM | POA: Diagnosis not present

## 2023-01-26 DIAGNOSIS — I6782 Cerebral ischemia: Secondary | ICD-10-CM | POA: Diagnosis not present

## 2023-01-26 DIAGNOSIS — R42 Dizziness and giddiness: Secondary | ICD-10-CM | POA: Insufficient documentation

## 2023-02-11 ENCOUNTER — Other Ambulatory Visit: Payer: Self-pay | Admitting: Nurse Practitioner

## 2023-02-11 DIAGNOSIS — L259 Unspecified contact dermatitis, unspecified cause: Secondary | ICD-10-CM

## 2023-02-16 ENCOUNTER — Encounter: Payer: Self-pay | Admitting: Nurse Practitioner

## 2023-02-16 ENCOUNTER — Ambulatory Visit (INDEPENDENT_AMBULATORY_CARE_PROVIDER_SITE_OTHER): Payer: Medicare Other | Admitting: *Deleted

## 2023-02-16 ENCOUNTER — Telehealth: Payer: Self-pay | Admitting: *Deleted

## 2023-02-16 VITALS — BP 133/53 | HR 66 | Ht 73.0 in | Wt 206.0 lb

## 2023-02-16 DIAGNOSIS — Z0000 Encounter for general adult medical examination without abnormal findings: Secondary | ICD-10-CM | POA: Diagnosis not present

## 2023-02-16 NOTE — Progress Notes (Signed)
Subjective:   Keith Cruz is a 82 y.o. male who presents for Medicare Annual/Subsequent preventive examination.  Visit Complete: Virtual I connected with  Lynett Grimes on 02/16/23 by a audio enabled telemedicine application and verified that I am speaking with the correct person using two identifiers.  Patient Location: Home  Provider Location: Office/Clinic  I discussed the limitations of evaluation and management by telemedicine. The patient expressed understanding and agreed to proceed.  Vital Signs: Because this visit was a virtual/telehealth visit, some criteria may be missing or patient reported. Any vitals not documented were not able to be obtained and vitals that have been documented are patient reported.  Patient Medicare AWV questionnaire was completed by the patient on 02/13/23; I have confirmed that all information answered by patient is correct and no changes since this date. Wife helped with answers.  Cardiac Risk Factors include: advanced age (>61men, >55 women);dyslipidemia;male gender;hypertension;Other (see comment), Risk factor comments: CAD, AFib     Objective:    Today's Vitals   02/16/23 1405 02/16/23 1408 02/16/23 1436  BP:  (!) 158/60 (!) 133/53  Pulse:  64 66  Weight: 206 lb (93.4 kg)    Height: 6\' 1"  (1.854 m)    PainSc:  5     Body mass index is 27.18 kg/m.     02/16/2023    2:34 PM 12/09/2022   10:56 AM 10/09/2022   10:27 AM 07/09/2022    3:07 PM 06/25/2022    8:08 AM 06/12/2022   10:41 AM 05/28/2022   11:18 AM  Advanced Directives  Does Patient Have a Medical Advance Directive? Yes Yes  Yes Yes Yes Yes  Type of Estate agent of [place];Living will Living will Healthcare Power of [place];Living will Healthcare Power of [place];Living will Healthcare Power of [place];Living will Healthcare Power of [place];Living will Healthcare Power of [place];Living will  Does patient want to make changes to medical advance  directive?  No - Patient declined       Copy of Healthcare Power of Attorney in Chart? No - copy requested     No - copy requested     Current Medications (verified) Outpatient Encounter Medications as of 02/16/2023  Medication Sig   Accu-Chek Softclix Lancets lancets Test blood glucose once daily   acetaminophen (TYLENOL) 500 MG tablet Take 500 mg by mouth every 6 (six) hours as needed.   albuterol (PROAIR HFA) 108 (90 Base) MCG/ACT inhaler INHALE 1-2 PUFFS INTO THE LUNGS EVERY 4 (FOUR) HOURS AS NEEDED FOR WHEEZING OR SHORTNESS OF BREATH.   apixaban (ELIQUIS) 5 MG TABS tablet TAKE 1 TABLET BY MOUTH TWICE DAILY (Patient taking differently: Take 5 mg by mouth 2 (two) times daily.)   ARNUITY ELLIPTA 100 MCG/ACT AEPB Inhale 1 puff into the lungs daily.   aspirin EC 81 MG tablet Take 81 mg by mouth daily.   atorvastatin (LIPITOR) 80 MG tablet Take 1 tablet (80 mg total) by mouth daily.   blood glucose meter kit and supplies Dispense based on patient and insurance preference. Use as directed to check blood glucose once daily.   Cholecalciferol (VITAMIN D3) 2000 UNITS capsule Take 2,000 Units by mouth daily. am   fexofenadine (ALLEGRA) 180 MG tablet Take 1 tablet (180 mg total) by mouth daily as needed for allergies or rhinitis.   fluticasone (FLONASE) 50 MCG/ACT nasal spray SPRAY 1 SPRAY INTO BOTH NOSTRILS DAILY.   folic acid (FOLVITE) 1 MG tablet Take 1 mg by mouth daily. am  glucose blood test strip Test blood glucose once daily   hydrALAZINE (APRESOLINE) 50 MG tablet Take 1 tablet (50 mg total) by mouth 3 (three) times daily.   metoprolol tartrate (LOPRESSOR) 50 MG tablet Take 1 tablet (50 mg total) by mouth 2 (two) times daily. D/c coreg pt prefers metoprolol   Multiple Vitamins-Minerals (CENTRUM SILVER 50+MEN PO) Take by mouth.   tamsulosin (FLOMAX) 0.4 MG CAPS capsule Take 0.4 mg by mouth daily.   triamcinolone cream (KENALOG) 0.1 % APPLY TO AFFECTED AREA TWICE A DAY   pantoprazole  (PROTONIX) 40 MG tablet Take 1 tablet (40 mg total) by mouth 2 (two) times daily before a meal. TRY TO TAKE ONLY 1X PER DAY INSTEAD OF 2 30 MIN BEFORE A MEAL   Facility-Administered Encounter Medications as of 02/16/2023  Medication   ipratropium-albuterol (DUONEB) 0.5-2.5 (3) MG/3ML nebulizer solution 3 mL    Allergies (verified) Fexofenadine-pseudoephed er, Nifedipine, Pseudoephedrine, Anoro ellipta [umeclidinium-vilanterol], Carvedilol, Doxycycline hyclate, and Prednisone   History: Past Medical History:  Diagnosis Date   Arthritis    back   Asthma    Atherosclerosis of aorta (HCC)    Atherosclerosis of aorta (HCC)    Bronchitis 05/2017   chronic   CAD (coronary artery disease)    Carotid artery disease (HCC)    Carotid stenosis    Carotid stenosis, bilateral    Chronic bronchitis (HCC)    Chronic cough    COPD (chronic obstructive pulmonary disease) (HCC)    Dysrhythmia    GERD (gastroesophageal reflux disease)    Heart murmur    HOH (hard of hearing)    hearing aids   Hypercholesteremia    Hypertension    Kidney disease    stent   Macular degeneration of left eye    Myocardial infarction (HCC) 2016   Pre-diabetes    Seasonal allergies    Seizure (HCC)    Seizures (HCC)    viral meningitis   Stroke [facility])    1991 and imaging 05/12/15 old strokes   TIA (transient ischemic attack) 1991   x2   Uses hearing aid    Viral meningitis 2017   Viral meningitis    Wears dentures    upper and lower   Past Surgical History:  Procedure Laterality Date   CAROTID ENDARTERECTOMY Left 2014   CAROTID ENDARTERECTOMY Right 1991   CAROTID PTA/STENT INTERVENTION Left 05/31/2020   Procedure: CAROTID PTA/STENT INTERVENTIO;  Surgeon: Annice Needy, MD;  Location: [facility] INVASIVE CV LAB;  Service: Cardiovascular;  Laterality: Left;   CATARACT EXTRACTION W/PHACO Left 06/24/2017   Procedure: CATARACT EXTRACTION PHACO AND INTRAOCULAR LENS PLACEMENT (IOC) LEFT BORDERLINE DIABETIC;  Surgeon:  Lockie Mola, MD;  Location: [facility];  Service: Ophthalmology;  Laterality: Left;   CATARACT EXTRACTION W/PHACO Right 07/22/2017   Procedure: CATARACT EXTRACTION PHACO AND INTRAOCULAR LENS PLACEMENT (IOC) RIGHT BORDERLINE DIABETIC;  Surgeon: Lockie Mola, MD;  Location: [facility];  Service: Ophthalmology;  Laterality: Right;   CORONARY ARTERY BYPASS GRAFT     x5   SKIN LESION EXCISION Left    left ear   URETERAL STENT PLACEMENT     Family History  Problem Relation Age of Onset   Stroke Mother    Heart attack Mother    Dementia Mother    Stroke Father    Stroke Sister    Alzheimer's disease Sister    Dementia Sister    Dementia Sister    Stroke Brother    Alzheimer's disease  Maternal Grandmother    Dementia Maternal Grandmother    Stroke Paternal Grandmother    Stroke Paternal Grandfather    Alzheimer's disease Paternal Grandfather    Cancer Neg Hx    Social History   Socioeconomic History   Marital status: Married    Spouse name: Not on file   Number of children: Not on file   Years of education: Not on file   Highest education level: 10th grade  Occupational History   Not on file  Tobacco Use   Smoking status: Former    Current packs/day: 0.00    Average packs/day: 2.0 packs/day for 10.0 years (20.0 ttl pk-yrs)    Types: Cigarettes    Start date: 04/28/1960    Quit date: 04/28/1970    Years since quitting: 52.8   Smokeless tobacco: Former  Building services engineer status: Never Used  Substance and Sexual Activity   Alcohol use: No    Alcohol/week: 0.0 standard drinks of alcohol    Comment: previous   Drug use: No   Sexual activity: Yes    Comment: wife   Other Topics Concern   Not on file  Social History Narrative   Re-married x 3 years wife named Audiological scientist pets, 1 dog.    Retired    Investment banker, operational, wears seat belt, safe in relationship    2 sons and 1 daughter    6 years [employer]    Social Determinants of Health    Financial Resource Strain: Low Risk  (02/13/2023)   Overall Financial Resource Strain (CARDIA)    Difficulty of Paying Living Expenses: Not hard at all  Food Insecurity: No Food Insecurity (02/13/2023)   Hunger Vital Sign    Worried About Running Out of Food in the Last Year: Never true    Ran Out of Food in the Last Year: Never true  Transportation Needs: No Transportation Needs (02/13/2023)   PRAPARE - Administrator, Civil Service (Medical): No    Lack of Transportation (Non-Medical): No  Physical Activity: Inactive (02/13/2023)   Exercise Vital Sign    Days of Exercise per Week: 0 days    Minutes of Exercise per Session: 0 min  Stress: Stress Concern Present (02/13/2023)   [employer] of Occupational Health - Occupational Stress Questionnaire    Feeling of Stress : To some extent  Social Connections: Moderately Isolated (02/13/2023)   Social Connection and Isolation Panel [NHANES]    Frequency of Communication with Friends and Family: More than three times a week    Frequency of Social Gatherings with Friends and Family: Once a week    Attends Religious Services: Never    Database administrator or Organizations: No    Attends Engineer, structural: Never    Marital Status: Married    Tobacco Counseling Counseling given: Not Answered   Clinical Intake:  Pre-visit preparation completed: Yes  Pain : 0-10 Pain Score: 5  Pain Type: Acute pain Pain Location: Head Pain Descriptors / Indicators: Sharp Pain Onset: Today Pain Frequency: Intermittent     BMI - recorded: 27.18 Nutritional Status: BMI 25 -29 Overweight Nutritional Risks: None Diabetes: No  How often do you need to have someone help you when you read instructions, pamphlets, or other written materials from your doctor or pharmacy?: 1 - Never  Interpreter Needed?: No  Information entered by :: R. Marylou Wages LPN   Activities of Daily Living    02/13/2023  10:57 AM 12/09/2022     4:55 PM  In your present state of health, do you have any difficulty performing the following activities:  Hearing? 1   Vision? 1   Comment wears   Difficulty concentrating or making decisions? 1   Walking or climbing stairs? 1   Dressing or bathing? 0   Doing errands, shopping? 1 0  Preparing Food and eating ? Y   Using the Toilet? N   In the past six months, have you accidently leaked urine? N   Do you have problems with loss of bowel control? N   Managing your Medications? Y   Managing your Finances? Y   Housekeeping or managing your Housekeeping? Y     Patient Care Team: [name], NP as PCP - General (Nurse Practitioner) Earna Coder, MD as Consulting Physician (Oncology)  Indicate any recent Medical Services you may have received from other than [facility] providers in the past year (date may be approximate).     Assessment:   This is a routine wellness examination for Donovyn.  Hearing/Vision screen Hearing Screening - Comments:: Difficulty hearing Vision Screening - Comments:: Difficulty seeing   Goals Addressed             This Visit's Progress    Patient Stated       Wants to find out what is going on with his health now       Depression Screen    02/16/2023    2:25 PM 05/28/2022   11:44 AM 05/13/2022    1:05 PM 02/14/2022   11:25 AM 11/22/2021    1:38 PM 02/11/2021    9:16 AM 02/09/2020    9:14 AM  PHQ 2/9 Scores  PHQ - 2 Score 4 2 0 0 4 0 0  PHQ- 9 Score 19    13      Fall Risk    02/13/2023   10:57 AM 05/13/2022    1:04 PM 02/14/2022   11:24 AM 11/22/2021    1:36 PM 02/19/2021   11:35 AM  Fall Risk   Falls in the past year? 1 1 1 1  0  Number falls in past yr: 1 1 0 0 0  Comment   Fell on L knee. Did not seek medical attention.    Injury with Fall? 0 0 0 1 0  Risk for fall due to : History of fall(s) History of fall(s)   No Fall Risks  Follow up Falls evaluation completed;Falls prevention discussed Falls evaluation completed Falls  evaluation completed  Falls evaluation completed    MEDICARE RISK AT HOME: Medicare Risk at Home Any stairs in or around the home?: No If so, are there any without handrails?: No Home free of loose throw rugs in walkways, pet beds, electrical cords, etc?: Yes Adequate lighting in your home to reduce risk of falls?: Yes Life alert?: No Use of a cane, walker or w/c?: No Grab bars in the bathroom?: Yes Shower chair or bench in shower?: Yes Elevated toilet seat or a handicapped toilet?: Yes Cognitive Function:        02/16/2023    2:34 PM 02/14/2022   11:39 AM 02/11/2021   10:37 AM 02/08/2019    9:26 AM 01/19/2018   10:50 AM  6CIT Screen  What Year? 4 points 0 points 0 points 0 points 0 points  What month? 3 points 0 points 0 points 0 points 0 points  What time? 3 points 0 points 0 points 0  points 0 points  Count back from 20 4 points 0 points  0 points 0 points  Months in reverse 4 points 0 points 0 points 0 points 0 points  Repeat phrase 8 points 0 points  0 points 2 points  Total Score 26 points 0 points  0 points 2 points    Immunizations Immunization History  Administered Date(s) Administered   Fluad Quad(high Dose 65+) 12/27/2021   Influenza, High Dose Seasonal PF 01/22/2015, 12/31/2017, 12/15/2018, 12/15/2019, 12/27/2020, 01/24/2023   Influenza-Unspecified 02/23/2015, 01/03/2017, 12/31/2017, 12/15/2018   PFIZER(Purple Top)SARS-COV-2 Vaccination 06/27/2019, 07/18/2019, 01/24/2023   Pneumococcal Conjugate-13 12/10/2015   Pneumococcal Polysaccharide-23 05/11/2018   Respiratory Syncytial Virus Vaccine,Recomb Aduvanted(Arexvy) 12/27/2021   Tdap 07/05/2018    TDAP status: Up to date  Flu Vaccine status: Up to date  Pneumococcal vaccine status: Up to date  Covid-19 vaccine status: Completed vaccines  Qualifies for Shingles Vaccine? Yes   Zostavax completed No   Shingrix Completed?: No.    Education has been provided regarding the importance of this vaccine. Patient  has been advised to call insurance company to determine out of pocket expense if they have not yet received this vaccine. Advised may also receive vaccine at local pharmacy or Health Dept. Verbalized acceptance and understanding.  Screening Tests Health Maintenance  Topic Date Due   Zoster Vaccines- Shingrix (1 of 2) Never done   Medicare Annual Wellness (AWV)  02/15/2023   COVID-19 Vaccine (4 - 2023-24 season) 03/21/2023   DTaP/Tdap/Td (2 - Td or Tdap) 07/04/2028   Pneumonia Vaccine 31+ Years old  Completed   INFLUENZA VACCINE  Completed   HPV VACCINES  Aged Out   Hepatitis C Screening  Discontinued    Health Maintenance  Health Maintenance Due  Topic Date Due   Zoster Vaccines- Shingrix (1 of 2) Never done   Medicare Annual Wellness (AWV)  02/15/2023    Colorectal cancer screening: No longer required.   Lung Cancer Screening: (Low Dose CT Chest recommended if Age 64-80 years, 20 pack-year currently smoking OR have quit w/in 15years.) does not qualify.    Additional Screening:  Hepatitis C Screening: does not qualify; Completed 04/2022  Vision Screening: Recommended annual ophthalmology exams for early detection of glaucoma and other disorders of the eye. Is the patient up to date with their annual eye exam?  Yes  Who is the provider or what is the name of the office in which the patient attends annual eye exams? [facility] If pt is not established with a provider, would they like to be referred to a provider to establish care? No .   Dental Screening: Recommended annual dental exams for proper oral hygiene  Community Resource Referral / Chronic Care Management: CRR required this visit?  No   CCM required this visit?  No     Plan:     I have personally reviewed and noted the following in the patient's chart:   Medical and social history Use of alcohol, tobacco or illicit drugs  Current medications and supplements including opioid prescriptions. Patient is not  currently taking opioid prescriptions. Functional ability and status Nutritional status Physical activity Advanced directives List of other physicians Hospitalizations, surgeries, and ER visits in previous 12 months Vitals Screenings to include cognitive, depression, and falls Referrals and appointments  In addition, I have reviewed and discussed with patient certain preventive protocols, quality metrics, and best practice recommendations. A written personalized care plan for preventive services as well as general preventive health recommendations were  provided to patient.     Sydell Axon, LPN   [id number]   After Visit Summary: (MyChart) Due to this being a telephonic visit, the after visit summary with patients personalized plan was offered to patient via MyChart   Nurse Notes: See Phone note. Wife helped with questions.

## 2023-02-16 NOTE — Patient Instructions (Signed)
[name] , Thank you for taking time to come for your Medicare Wellness Visit. I appreciate your ongoing commitment to your health goals. Please review the following plan we discussed and let me know if I can assist you in the future.   Referrals/Orders/Follow-Ups/Clinician Recommendations: None  This is a list of the screening recommended for you and due dates:  Health Maintenance  Topic Date Due   Zoster (Shingles) Vaccine (1 of 2) Never done   COVID-19 Vaccine (4 - 2023-24 season) 03/21/2023   Medicare Annual Wellness Visit  02/16/2024   DTaP/Tdap/Td vaccine (2 - Td or Tdap) 07/04/2028   Pneumonia Vaccine  Completed   Flu Shot  Completed   HPV Vaccine  Aged Out   Hepatitis C Screening  Discontinued    Advanced directives: (Copy Requested) Please bring a copy of your health care power of attorney and living will to the office to be added to your chart at your convenience.  Next Medicare Annual Wellness Visit scheduled for next year: Yes 02/17/24 @ 10:15

## 2023-02-16 NOTE — Telephone Encounter (Signed)
Called patient to perform his AWV. While on the phone patient stated that he was having a headache today. Patient stated that he has not had headaches before. Patient's wife agreed. Patient stated that the pain is sharp over his left eye and stated that it is a pain level 5. Patient's wife denies that he has any other symptoms and has not taken Tylenol but will have him take some now. Patient's wife was advised that this message will go to the doctor. Patient's wife was given ER precautions and she verbalized understanding. Patient's wife stated that he has been upset today because of recent lab results of his RPR and is waiting on a call from the Health Department. Two BP reading 158/60 and 133/53.

## 2023-02-17 ENCOUNTER — Emergency Department: Payer: Medicare Other

## 2023-02-17 ENCOUNTER — Emergency Department
Admission: EM | Admit: 2023-02-17 | Discharge: 2023-02-17 | Disposition: A | Discharge status: Home / Self Care | Payer: Medicare Other | Attending: Emergency Medicine | Admitting: Emergency Medicine

## 2023-02-17 ENCOUNTER — Other Ambulatory Visit: Payer: Self-pay

## 2023-02-17 DIAGNOSIS — S62335A Displaced fracture of neck of fourth metacarpal bone, left hand, initial encounter for closed fracture: Secondary | ICD-10-CM | POA: Diagnosis not present

## 2023-02-17 DIAGNOSIS — J449 Chronic obstructive pulmonary disease, unspecified: Secondary | ICD-10-CM | POA: Diagnosis not present

## 2023-02-17 DIAGNOSIS — W01198A Fall on same level from slipping, tripping and stumbling with subsequent striking against other object, initial encounter: Secondary | ICD-10-CM | POA: Insufficient documentation

## 2023-02-17 DIAGNOSIS — Z7901 Long term (current) use of anticoagulants: Secondary | ICD-10-CM | POA: Diagnosis not present

## 2023-02-17 DIAGNOSIS — Y92481 Parking lot as the place of occurrence of the external cause: Secondary | ICD-10-CM | POA: Insufficient documentation

## 2023-02-17 DIAGNOSIS — S62655A Nondisplaced fracture of medial phalanx of left ring finger, initial encounter for closed fracture: Secondary | ICD-10-CM | POA: Diagnosis not present

## 2023-02-17 DIAGNOSIS — S6992XA Unspecified injury of left wrist, hand and finger(s), initial encounter: Secondary | ICD-10-CM | POA: Diagnosis present

## 2023-02-17 DIAGNOSIS — I10 Essential (primary) hypertension: Secondary | ICD-10-CM | POA: Diagnosis not present

## 2023-02-17 DIAGNOSIS — I2510 Atherosclerotic heart disease of native coronary artery without angina pectoris: Secondary | ICD-10-CM | POA: Diagnosis not present

## 2023-02-17 DIAGNOSIS — S62659A Nondisplaced fracture of medial phalanx of unspecified finger, initial encounter for closed fracture: Secondary | ICD-10-CM | POA: Diagnosis not present

## 2023-02-17 DIAGNOSIS — S0990XA Unspecified injury of head, initial encounter: Secondary | ICD-10-CM

## 2023-02-17 DIAGNOSIS — S0993XA Unspecified injury of face, initial encounter: Secondary | ICD-10-CM

## 2023-02-17 DIAGNOSIS — S01511A Laceration without foreign body of lip, initial encounter: Secondary | ICD-10-CM

## 2023-02-17 DIAGNOSIS — I4891 Unspecified atrial fibrillation: Secondary | ICD-10-CM | POA: Diagnosis not present

## 2023-02-17 DIAGNOSIS — Z955 Presence of coronary angioplasty implant and graft: Secondary | ICD-10-CM | POA: Insufficient documentation

## 2023-02-17 DIAGNOSIS — S199XXA Unspecified injury of neck, initial encounter: Secondary | ICD-10-CM | POA: Diagnosis not present

## 2023-02-17 MED ORDER — LIDOCAINE HCL (PF) 1 % IJ SOLN
5.0000 mL | Freq: Once | INTRAMUSCULAR | Status: AC
  Administered 2023-02-17: 5 mL
  Filled 2023-02-17: qty 5

## 2023-02-17 MED ORDER — LIDOCAINE-EPINEPHRINE-TETRACAINE (LET) TOPICAL GEL
3.0000 mL | Freq: Once | TOPICAL | Status: DC
  Filled 2023-02-17: qty 3

## 2023-02-17 MED ORDER — TRANEXAMIC ACID FOR EPISTAXIS
500.0000 mg | Freq: Once | TOPICAL | Status: DC
  Filled 2023-02-17 (×2): qty 10

## 2023-02-17 NOTE — ED Triage Notes (Signed)
Pt to ED for fall, tripped over curb in parking lot. Laceration to bottom lip, bleeding controlled. +blood thinners. Also c/o pain to left ring finger. Denies LOC

## 2023-02-17 NOTE — Discharge Instructions (Signed)
Your exam, labs, and CT are normal and reassuring at this time.  No signs of a serious head injury.  You did sustain a laceration to your lip and abrasions to the mouth.  He also sustained a fracture to your left ring finger.  Wear the fingers buddy taped and splinted until you see your provider.  See your primary provider in 5 to 7 days for suture removal.

## 2023-02-17 NOTE — ED Provider Notes (Signed)
[name]     Event Date/Time   First MD Initiated Contact with Patient 02/17/23 1512     (approximate)   History   Fall   HPI  Keith Cruz is a 82 y.o. male with a history of A-fib on anticoagulation, CAD status post CABG, HLD, GERD, COPD, TIA, and HTN, presents to the ED for evaluation following injury.  Patient sustained some facial trauma after a mechanical fall.  He apparently tripped over the curb in the parking lot, falling, hitting his face, and sustaining a laceration to his bottom lip.  He apparently also broke his upper denture plates completely and sustained some lacerations to the gum and lip.  He also reports some pain to his left ring finger.  He denies any LOC or preceding weakness, dizziness, or chest pain.   Physical Exam   Triage Vital Signs: ED Triage Vitals  Encounter Vitals Group     BP 02/17/23 1303 (!) 178/66     Systolic BP Percentile --      Diastolic BP Percentile --      Pulse Rate 02/17/23 1301 72     Resp 02/17/23 1301 20     Temp 02/17/23 1301 98.2 F (36.8 C)     Temp src --      SpO2 02/17/23 1301 96 %     Weight 02/17/23 1302 208 lb (94.3 kg)     Height 02/17/23 1302 6\' 4"  (1.93 m)     Head Circumference --      Peak Flow --      Pain Score 02/17/23 1302 3     Pain Loc --      Pain Education --      Exclude from Growth Chart --     Most recent vital signs: Vitals:   02/17/23 1301 02/17/23 1303  BP:  (!) 178/66  Pulse: 72   Resp: 20   Temp: 98.2 F (36.8 C)   SpO2: 96%     General Awake, no distress. NAD HEENT NCAT. PERRL. EOMI. No rhinorrhea. Mucous membranes are moist.  Edentulous to the upper maxilla.  Evidence of local trauma to the upper gumline.  Single pedunculated piece of gum is noted anteriorly.  No tongue laceration appreciated.  Patient with a 1.5 cm laceration to the external lower lip.  Bleeding is controlled with damp gauze and a nasal clip  applied. CV:  Good peripheral perfusion.  RESP:  Normal effort.  ABD:  No distention.  MSK:  , No obvious deformity or dislocation.  Patient with ecchymosis noted to the left ring finger at the distal fat pad.  He endorses pain at the proximal phalanx of the left finger.  Normal composite fist of the left hand noted.   ED Results / Procedures / Treatments   Labs (all labs ordered are listed, but only abnormal results are displayed) Labs Reviewed - No data to display   EKG    RADIOLOGY  I personally viewed and evaluated these images as part of my medical decision making, as well as reviewing the written report by the radiologist.  ED Provider Interpretation: No acute findings of the CT head/neck.  A nondisplaced fracture of the middle phalanx of the right ring finger noted  DG Finger Ring Left  Result Date: 02/17/2023 CLINICAL DATA:  Pain after injury. EXAM: LEFT RING FINGER 2+V COMPARISON:  None Available. FINDINGS: Nondisplaced volar plate fracture of the middle phalanx at  the proximal interphalangeal joint. There is also a mildly displaced distal fourth metacarpal fracture. The digit is held in flexion on all views. Mild soft tissue edema IMPRESSION: 1. Nondisplaced volar plate fracture of the middle phalanx at the proximal interphalangeal joint. 2. Mildly displaced distal fourth metacarpal fracture. Electronically Signed   By: Narda Rutherford M.D.   On: 02/17/2023 16:00   CT HEAD WO CONTRAST ( )  Result Date: 02/17/2023 CLINICAL DATA:  Head trauma, minor (Age >= 65y); Neck trauma (Age >= 65y) EXAM: CT HEAD WITHOUT CONTRAST CT CERVICAL SPINE WITHOUT CONTRAST TECHNIQUE: Multidetector CT imaging of the head and cervical spine was performed following the standard protocol without intravenous contrast. Multiplanar CT image reconstructions of the cervical spine were also generated. RADIATION DOSE REDUCTION: This exam was performed according to the departmental dose-optimization  program which includes automated exposure control, adjustment of the mA and/or kV according to patient size and/or use of iterative reconstruction technique. COMPARISON:  Brain MR 01/26/2023 FINDINGS: CT HEAD FINDINGS Brain: No hemorrhage. No hydrocephalus. No extra-axial fluid collection. Chronic infarct in the right parietal lobe. Unchanged size and shape of the ventricular system. No mass effect. No mass lesion. Vascular: No hyperdense vessel or unexpected calcification. Skull: Normal. Negative for fracture or focal lesion. Sinuses/Orbits: Small bilateral mastoid and middle ear effusions. Pansinus mucosal thickening with frothy secretions in the bilateral maxillary sinuses. Findings are nonspecific, but could be seen in the setting of acute sinusitis. Bilateral lens replacement. Orbits are otherwise unremarkable. Other: None. CT CERVICAL SPINE FINDINGS Alignment: Trace anterolisthesis of C2 on C3 and C3 on C4. Skull base and vertebrae: No acute fracture. No primary bone lesion or focal pathologic process. Chronic fracture of the inferior articular process on the right at T1. Soft tissues and spinal canal: No prevertebral fluid or swelling. No visible canal hematoma. Disc levels:  No evidence of high-grade spinal canal stenosis Upper chest: Negative. Other: Left carotid stent in place. IMPRESSION: 1. No acute intracranial abnormality. Chronic infarct in the right parietal lobe. 2. No acute fracture or traumatic malalignment of the cervical spine. Electronically Signed   By: Lorenza Cambridge M.D.   On: 02/17/2023 15:36   CT Cervical Spine Wo Contrast  Result Date: 02/17/2023 CLINICAL DATA:  Head trauma, minor (Age >= 65y); Neck trauma (Age >= 65y) EXAM: CT HEAD WITHOUT CONTRAST CT CERVICAL SPINE WITHOUT CONTRAST TECHNIQUE: Multidetector CT imaging of the head and cervical spine was performed following the standard protocol without intravenous contrast. Multiplanar CT image reconstructions of the cervical spine  were also generated. RADIATION DOSE REDUCTION: This exam was performed according to the departmental dose-optimization program which includes automated exposure control, adjustment of the mA and/or kV according to patient size and/or use of iterative reconstruction technique. COMPARISON:  Brain MR 01/26/2023 FINDINGS: CT HEAD FINDINGS Brain: No hemorrhage. No hydrocephalus. No extra-axial fluid collection. Chronic infarct in the right parietal lobe. Unchanged size and shape of the ventricular system. No mass effect. No mass lesion. Vascular: No hyperdense vessel or unexpected calcification. Skull: Normal. Negative for fracture or focal lesion. Sinuses/Orbits: Small bilateral mastoid and middle ear effusions. Pansinus mucosal thickening with frothy secretions in the bilateral maxillary sinuses. Findings are nonspecific, but could be seen in the setting of acute sinusitis. Bilateral lens replacement. Orbits are otherwise unremarkable. Other: None. CT CERVICAL SPINE FINDINGS Alignment: Trace anterolisthesis of C2 on C3 and C3 on C4. Skull base and vertebrae: No acute fracture. No primary bone lesion or focal pathologic process. Chronic fracture of the  inferior articular process on the right at T1. Soft tissues and spinal canal: No prevertebral fluid or swelling. No visible canal hematoma. Disc levels:  No evidence of high-grade spinal canal stenosis Upper chest: Negative. Other: Left carotid stent in place. IMPRESSION: 1. No acute intracranial abnormality. Chronic infarct in the right parietal lobe. 2. No acute fracture or traumatic malalignment of the cervical spine. Electronically Signed   By: Lorenza Cambridge M.D.   On: 02/17/2023 15:36     PROCEDURES:  Critical Care performed: No Finger splint placed Fingers buddy taped  .Marland KitchenLaceration Repair  Date/Time: 02/17/2023 5:08 PM  Performed by: Lissa Hoard, PA-C Authorized by: Lissa Hoard, PA-C   Consent:    Consent obtained:  Verbal    Consent given by:  Patient   Risks, benefits, and alternatives were discussed: yes     Risks discussed:  Pain, poor wound healing and poor cosmetic result   Alternatives discussed:  No treatment Universal protocol:    Site/side marked: yes     Patient identity confirmed:  Verbally with patient Anesthesia:    Anesthesia method:  Nerve block   Block location:  Submental foramen   Block needle gauge:  27 G   Block anesthetic:  Lidocaine 1% w/o epi   Block injection procedure:  Anatomic landmarks palpated, introduced needle and incremental injection   Block outcome:  Anesthesia achieved Laceration details:    Location:  Lip   Lip location:  Lower exterior lip   Length (cm):  1.5   Depth (mm):  5 Pre-procedure details:    Preparation:  Patient was prepped and draped in usual sterile fashion Exploration:    Limited defect created (wound extended): no     Hemostasis achieved with:  Direct pressure   Contaminated: no   Treatment:    Area cleansed with:  Saline   Amount of cleaning:  Standard   Irrigation solution:  Sterile saline   Irrigation volume:  10   Irrigation method:  Tap   Debridement:  None   Undermining:  None   Scar revision: no   Skin repair:    Repair method:  Sutures   Suture size:  4-0   Suture material:  Nylon   Suture technique:  Simple interrupted   Number of sutures:  2 Approximation:    Approximation:  Close   Vermilion border well-aligned: yes   Repair type:    Repair type:  Simple Post-procedure details:    Dressing:  Open (no dressing)   Procedure completion:  Tolerated well, no immediate complications    MEDICATIONS ORDERED IN ED: Medications  lidocaine (PF) (XYLOCAINE) 1 % injection 5 mL (5 mLs Infiltration Given by Other 02/17/23 1806)     IMPRESSION / MDM / ASSESSMENT AND PLAN / ED COURSE  I reviewed the triage vital signs and the nursing notes.                              Differential diagnosis includes, but is not limited to, closed  head injury, concussion, SDH, cervical fracture, cervical dislocation, hand fracture, hand dislocation  Patient's presentation is most consistent with acute complicated illness / injury requiring diagnostic workup.  Patient's diagnosis is consistent with mechanical fall resulting in a external lip laceration and several mucosal abrasions.  Patient also sustained a nondisplaced fracture of the left ring finger Review of plain films.  No radiologic evidence of any close head injury,  intracranial process, or cervical spine fracture based on interpretation.  Patient consented to a suture repair to the lower lip which were performed with good wound edge approximation.  Patient will be discharged home with wound and fracture care instructions. Patient is to follow up with primary provider as discussed as needed or otherwise directed. Patient is given ED precautions to return to the ED for any worsening or new symptoms.  FINAL CLINICAL IMPRESSION(S) / ED DIAGNOSES   Final diagnoses:  Minor head injury, initial encounter  Lip laceration, initial encounter  Mouth injury, initial encounter  Closed nondisplaced fracture of middle phalanx of left ring finger, initial encounter     Rx / DC Orders   ED Discharge Orders     None        Note:  This document was prepared using Dragon voice recognition software and may include unintentional dictation errors.    Lissa Hoard, PA-C 02/18/23 0112    Minna Antis, MD 02/18/23 [phone]

## 2023-02-17 NOTE — ED Notes (Signed)
See triage note  Presents s/p fall  States fell hit mouth on curb  States he tripped over the curb Laceration to lip

## 2023-02-20 ENCOUNTER — Telehealth: Payer: Self-pay | Admitting: Nurse Practitioner

## 2023-02-20 NOTE — Telephone Encounter (Signed)
Burna Mortimer from [employer] called stating there is a plan of care form that is a month out of compliance and she would like to know when the order will be signed

## 2023-02-20 NOTE — Telephone Encounter (Signed)
Called wanda back and informed her that on 01-01-23 we received orders and they were signed and faxed back on the same exact day, she stated these were 01/07/23 orders. I told her I never received any 9/11 orders and if I did documentation is kept to prove that things have been faxed and signed. Burna Mortimer stated she has re faxed the forms.

## 2023-02-20 NOTE — Telephone Encounter (Signed)
Forms have been received and handed to provider for provider signature. Provider signed and forms have been faxed attn to wanda [phone] with a completed transmission log,.

## 2023-02-26 ENCOUNTER — Ambulatory Visit: Payer: Medicare Other | Admitting: Nurse Practitioner

## 2023-02-26 ENCOUNTER — Encounter: Payer: Self-pay | Admitting: Nurse Practitioner

## 2023-02-26 VITALS — BP 128/60 | HR 73 | Temp 99.1°F | Ht 76.0 in | Wt 199.2 lb

## 2023-02-26 DIAGNOSIS — Z4802 Encounter for removal of sutures: Secondary | ICD-10-CM | POA: Diagnosis not present

## 2023-02-26 DIAGNOSIS — S01511A Laceration without foreign body of lip, initial encounter: Secondary | ICD-10-CM | POA: Insufficient documentation

## 2023-02-26 DIAGNOSIS — A530 Latent syphilis, unspecified as early or late: Secondary | ICD-10-CM | POA: Diagnosis not present

## 2023-02-26 DIAGNOSIS — S01511D Laceration without foreign body of lip, subsequent encounter: Secondary | ICD-10-CM

## 2023-02-26 DIAGNOSIS — J0100 Acute maxillary sinusitis, unspecified: Secondary | ICD-10-CM | POA: Diagnosis not present

## 2023-02-26 DIAGNOSIS — S62655D Nondisplaced fracture of medial phalanx of left ring finger, subsequent encounter for fracture with routine healing: Secondary | ICD-10-CM | POA: Insufficient documentation

## 2023-02-26 MED ORDER — AMOXICILLIN-POT CLAVULANATE 875-125 MG PO TABS
1.0000 | ORAL_TABLET | Freq: Two times a day (BID) | ORAL | 0 refills | Status: DC
Start: 2023-02-26 — End: 2023-04-24

## 2023-02-26 NOTE — Assessment & Plan Note (Signed)
A CT scan revealed: "small bilateral mastoid and middle ear effusions.Pansinus mucosal thickening with frothy secretions in the bilateral maxillary sinuses. Findings are nonspecific, but could be seen in the setting of acute sinusitis." He does have accompanied cough, congestion and drainage. We discussed the benefits of antibiotic treatment. Will treat with Augmentin 875 BID x 10 days.

## 2023-02-26 NOTE — Assessment & Plan Note (Signed)
He sustained a facial laceration from a fall, necessitating two stitches. The wound is healing well without signs of infection. We discussed the risks of infection and the importance of keeping the wound clean and moisturized. We will remove the sutures and apply Vaseline to the wound.

## 2023-02-26 NOTE — Assessment & Plan Note (Signed)
Two sutures were removed from the lower lip. The first suture was easily removed as it was loose and had a large scab. The second suture was more difficult to remove due to a scab covering it, but it was successfully extracted. The lip showed no bleeding and appeared to be healing well.

## 2023-02-26 NOTE — Progress Notes (Signed)
[name], NP-C Phone: [phone]  Keith Cruz is a 82 y.o. male who presents today for ED follow up.   Discussed the use of AI scribe software for clinical note transcription with the patient, who gave verbal consent to proceed.  History of Present Illness   Patient evaluated in ED on 02/17/2023 after suffering a fall. He tripped over a curb in the parking lot at a [employer]. The fall resulted in facial trauma, including a laceration to the lower lip that required suturing, and damage to the patient's false teeth.   In addition to the facial injuries, the patient sustained injuries to both knees and hands during the fall. The patient reported that the fall was unbroken, resulting in direct impact to the knees and hands. The patient's left hand, in particular, was noted to be significantly bruised and swollen, which was found to be fractured on x-ray in the ED. It was splinted and taped for support.   X-ray Left Ring Finger Impression: Nondisplaced volar plate fracture of the middle phalanx at the proximal interphalangeal joint. Mildly displaced distal fourth metacarpal fracture.  He also had a CT Head and CT Cervical Spine while in the ED. Notes and imaging reviewed.   The patient's wife notes a recent positive RPR test with Neurology on 01/22/2023.  She is requesting repeat testing. She states the Neurologist advised they would be contacted by the Health Dept for further information and instructions however they have not heard anything.  The patient's mental health was noted to be affected, with the patient expressing significant distress and anxiety related to the recent positive RPR test result. The patient reported feeling stunned by the result and expressed concerns about the potential impact on his family. He denies any exposure to syphilis and states that he has been faithful to his wife over the years they have been married. He denies any skin lesions or rashes.       Social History   Tobacco Use  Smoking Status Former   Current packs/day: 0.00   Average packs/day: 2.0 packs/day for 10.0 years (20.0 ttl pk-yrs)   Types: Cigarettes   Start date: 04/28/1960   Quit date: 04/28/1970   Years since quitting: 52.8  Smokeless Tobacco Former    Current Outpatient Medications on File Prior to Visit  Medication Sig Dispense Refill   Accu-Chek Softclix Lancets lancets Test blood glucose once daily 100 each 12   acetaminophen (TYLENOL) 500 MG tablet Take 500 mg by mouth every 6 (six) hours as needed.     albuterol (PROAIR HFA) 108 (90 Base) MCG/ACT inhaler INHALE 1-2 PUFFS INTO THE LUNGS EVERY 4 (FOUR) HOURS AS NEEDED FOR WHEEZING OR SHORTNESS OF BREATH. 18 each 12   apixaban (ELIQUIS) 5 MG TABS tablet TAKE 1 TABLET BY MOUTH TWICE DAILY (Patient taking differently: Take 5 mg by mouth 2 (two) times daily.) 60 tablet 5   ARNUITY ELLIPTA 100 MCG/ACT AEPB Inhale 1 puff into the lungs daily.     aspirin EC 81 MG tablet Take 81 mg by mouth daily.     atorvastatin (LIPITOR) 80 MG tablet Take 1 tablet (80 mg total) by mouth daily. 90 tablet 3   blood glucose meter kit and supplies Dispense based on patient and insurance preference. Use as directed to check blood glucose once daily. 1 each 0   Cholecalciferol (VITAMIN D3) 2000 UNITS capsule Take 2,000 Units by mouth daily. am     fexofenadine (ALLEGRA) 180 MG tablet Take 1  tablet (180 mg total) by mouth daily as needed for allergies or rhinitis. 90 tablet 3   fluticasone (FLONASE) 50 MCG/ACT nasal spray SPRAY 1 SPRAY INTO BOTH NOSTRILS DAILY. 16 mL 2   folic acid (FOLVITE) 1 MG tablet Take 1 mg by mouth daily. am     glucose blood test strip Test blood glucose once daily 100 each 12   hydrALAZINE (APRESOLINE) 50 MG tablet Take 1 tablet (50 mg total) by mouth 3 (three) times daily. 270 tablet 3   metoprolol tartrate (LOPRESSOR) 50 MG tablet Take 1 tablet (50 mg total) by mouth 2 (two) times daily. D/c coreg pt prefers  metoprolol 180 tablet 3   Multiple Vitamins-Minerals (CENTRUM SILVER 50+MEN PO) Take by mouth.     pantoprazole (PROTONIX) 40 MG tablet Take 1 tablet (40 mg total) by mouth 2 (two) times daily before a meal. TRY TO TAKE ONLY 1X PER DAY INSTEAD OF 2 30 MIN BEFORE A MEAL 180 tablet 3   tamsulosin (FLOMAX) 0.4 MG CAPS capsule Take 0.4 mg by mouth daily.     triamcinolone cream (KENALOG) 0.1 % APPLY TO AFFECTED AREA TWICE A DAY 30 g 5   Current Facility-Administered Medications on File Prior to Visit  Medication Dose Route Frequency Provider Last Rate Last Admin   ipratropium-albuterol (DUONEB) 0.5-2.5 (3) MG/3ML nebulizer solution 3 mL  3 mL Nebulization Once Smitty Cords, DO         ROS see history of present illness  Objective  Physical Exam Vitals:   02/26/23 1442  BP: 128/60  Pulse: 73  Temp: 99.1 F (37.3 C)  SpO2: 96%    BP Readings from Last 3 Encounters:  02/26/23 128/60  02/17/23 (!) 178/66  02/16/23 (!) 133/53   Wt Readings from Last 3 Encounters:  02/26/23 199 lb 3.2 oz (90.4 kg)  02/17/23 208 lb (94.3 kg)  02/16/23 206 lb (93.4 kg)    Physical Exam Constitutional:      General: He is not in acute distress.    Appearance: Normal appearance.  HENT:     Head: Normocephalic.  Cardiovascular:     Rate and Rhythm: Normal rate and regular rhythm.     Heart sounds: Normal heart sounds.  Pulmonary:     Effort: Pulmonary effort is normal.     Breath sounds: Normal breath sounds.  Musculoskeletal:     Right hand: Normal.     Left hand: Swelling and tenderness present. Decreased range of motion. Decreased strength.     Comments: 4th and 5th fingers with significant bruising, mild swelling  Skin:    General: Skin is warm and dry.     Findings: Laceration (scabbed over, well healed, 2 sutures present. No bleeding present) present.  Neurological:     General: No focal deficit present.     Mental Status: He is alert.  Psychiatric:        Mood and  Affect: Mood normal.        Behavior: Behavior normal.   Suture Removal  Date/Time: 02/26/2023 3:42 PM  Performed by: [name], NP Authorized by: [name], NP  Body area: head/neck Location details: lower lip Wound Appearance: clean and tender (scabbing present) Sutures Removed: 2 Patient tolerance: patient tolerated the procedure well with no immediate complications Comments: Consent obtained. No bleeding or drainage present. Well healed.      Assessment/Plan: Please see individual problem list.  Encounter for removal of sutures Assessment & Plan: Two sutures were removed from  the lower lip. The first suture was easily removed as it was loose and had a large scab. The second suture was more difficult to remove due to a scab covering it, but it was successfully extracted. The lip showed no bleeding and appeared to be healing well.  Orders: -     Suture Removal  Lip laceration, subsequent encounter Assessment & Plan: He sustained a facial laceration from a fall, necessitating two stitches. The wound is healing well without signs of infection. We discussed the risks of infection and the importance of keeping the wound clean and moisturized. We will remove the sutures and apply Vaseline to the wound.   Closed nondisplaced fracture of middle phalanx of left ring finger with routine healing, subsequent encounter Assessment & Plan: He has a fractured ring finger with significant bruising and pain. Despite wearing a splint, he has not consulted an orthopedic specialist. We discussed seeing a specialist for consultation to ensure proper healing and to avoid complications. He prefers to continue with the current treatment unless issues arise. Advised him to continue wearing the splint for two more weeks. Splint was applied and the ring and pinky fingers were taped together for support.   Acute non-recurrent maxillary sinusitis Assessment & Plan: A CT scan revealed: "small  bilateral mastoid and middle ear effusions.Pansinus mucosal thickening with frothy secretions in the bilateral maxillary sinuses. Findings are nonspecific, but could be seen in the setting of acute sinusitis." He does have accompanied cough, congestion and drainage. We discussed the benefits of antibiotic treatment. Will treat with Augmentin 875 BID x 10 days.   Orders: -     Amoxicillin-Pot Clavulanate; Take 1 tablet by mouth 2 (two) times daily.  Dispense: 20 tablet; Refill: 0  Positive RPR test Assessment & Plan: His RPR test returned positive with a titer of 1:1. We discussed the possibility of a false positive and the need for repeat testing, explaining that a higher titer indicates an active infection requiring treatment. We will repeat the RPR test and review and discuss the results.  Orders: -     RPR   Return in 3 months (on 05/15/2023) for Follow up as scheduled, sooner PRN.   [name], NP-C [facility] [employer]

## 2023-02-26 NOTE — Assessment & Plan Note (Signed)
He has a fractured ring finger with significant bruising and pain. Despite wearing a splint, he has not consulted an orthopedic specialist. We discussed seeing a specialist for consultation to ensure proper healing and to avoid complications. He prefers to continue with the current treatment unless issues arise. Advised him to continue wearing the splint for two more weeks. Splint was applied and the ring and pinky fingers were taped together for support.

## 2023-02-26 NOTE — Assessment & Plan Note (Signed)
His RPR test returned positive with a titer of 1:1. We discussed the possibility of a false positive and the need for repeat testing, explaining that a higher titer indicates an active infection requiring treatment. We will repeat the RPR test and review and discuss the results.

## 2023-02-27 LAB — RPR: RPR Ser Ql: NONREACTIVE

## 2023-03-03 ENCOUNTER — Encounter: Payer: Self-pay | Admitting: Nurse Practitioner

## 2023-03-03 ENCOUNTER — Encounter (INDEPENDENT_AMBULATORY_CARE_PROVIDER_SITE_OTHER): Payer: Self-pay | Admitting: Vascular Surgery

## 2023-03-16 ENCOUNTER — Other Ambulatory Visit (INDEPENDENT_AMBULATORY_CARE_PROVIDER_SITE_OTHER): Payer: Self-pay

## 2023-03-16 MED ORDER — APIXABAN 5 MG PO TABS: 5.0000 mg | ORAL_TABLET | Freq: Two times a day (BID) | ORAL | 5 refills | Status: DC

## 2023-03-17 DIAGNOSIS — J439 Emphysema, unspecified: Secondary | ICD-10-CM | POA: Diagnosis not present

## 2023-04-07 ENCOUNTER — Ambulatory Visit: Payer: Medicare Other | Admitting: Dermatology

## 2023-04-10 ENCOUNTER — Inpatient Hospital Stay: Payer: Medicare Other | Attending: Internal Medicine

## 2023-04-10 DIAGNOSIS — Z79899 Other long term (current) drug therapy: Secondary | ICD-10-CM | POA: Diagnosis not present

## 2023-04-10 DIAGNOSIS — M549 Dorsalgia, unspecified: Secondary | ICD-10-CM | POA: Insufficient documentation

## 2023-04-10 DIAGNOSIS — M129 Arthropathy, unspecified: Secondary | ICD-10-CM | POA: Insufficient documentation

## 2023-04-10 DIAGNOSIS — D631 Anemia in chronic kidney disease: Secondary | ICD-10-CM | POA: Insufficient documentation

## 2023-04-10 DIAGNOSIS — I4891 Unspecified atrial fibrillation: Secondary | ICD-10-CM | POA: Diagnosis not present

## 2023-04-10 DIAGNOSIS — J4489 Other specified chronic obstructive pulmonary disease: Secondary | ICD-10-CM | POA: Insufficient documentation

## 2023-04-10 DIAGNOSIS — J449 Chronic obstructive pulmonary disease, unspecified: Secondary | ICD-10-CM | POA: Insufficient documentation

## 2023-04-10 DIAGNOSIS — C9000 Multiple myeloma not having achieved remission: Secondary | ICD-10-CM | POA: Diagnosis not present

## 2023-04-10 DIAGNOSIS — G8929 Other chronic pain: Secondary | ICD-10-CM | POA: Diagnosis not present

## 2023-04-10 DIAGNOSIS — N1832 Chronic kidney disease, stage 3b: Secondary | ICD-10-CM | POA: Insufficient documentation

## 2023-04-10 DIAGNOSIS — E7800 Pure hypercholesterolemia, unspecified: Secondary | ICD-10-CM | POA: Diagnosis not present

## 2023-04-10 DIAGNOSIS — R413 Other amnesia: Secondary | ICD-10-CM | POA: Insufficient documentation

## 2023-04-10 DIAGNOSIS — R634 Abnormal weight loss: Secondary | ICD-10-CM | POA: Diagnosis not present

## 2023-04-10 DIAGNOSIS — Z8673 Personal history of transient ischemic attack (TIA), and cerebral infarction without residual deficits: Secondary | ICD-10-CM | POA: Diagnosis not present

## 2023-04-10 DIAGNOSIS — I252 Old myocardial infarction: Secondary | ICD-10-CM | POA: Insufficient documentation

## 2023-04-10 DIAGNOSIS — N183 Chronic kidney disease, stage 3 unspecified: Secondary | ICD-10-CM

## 2023-04-10 DIAGNOSIS — D469 Myelodysplastic syndrome, unspecified: Secondary | ICD-10-CM | POA: Diagnosis not present

## 2023-04-10 DIAGNOSIS — Z87891 Personal history of nicotine dependence: Secondary | ICD-10-CM | POA: Diagnosis not present

## 2023-04-10 DIAGNOSIS — J45909 Unspecified asthma, uncomplicated: Secondary | ICD-10-CM | POA: Insufficient documentation

## 2023-04-10 DIAGNOSIS — D649 Anemia, unspecified: Secondary | ICD-10-CM

## 2023-04-10 DIAGNOSIS — I2510 Atherosclerotic heart disease of native coronary artery without angina pectoris: Secondary | ICD-10-CM | POA: Diagnosis not present

## 2023-04-10 DIAGNOSIS — K219 Gastro-esophageal reflux disease without esophagitis: Secondary | ICD-10-CM | POA: Insufficient documentation

## 2023-04-10 DIAGNOSIS — I129 Hypertensive chronic kidney disease with stage 1 through stage 4 chronic kidney disease, or unspecified chronic kidney disease: Secondary | ICD-10-CM | POA: Diagnosis not present

## 2023-04-10 DIAGNOSIS — Z7982 Long term (current) use of aspirin: Secondary | ICD-10-CM | POA: Diagnosis not present

## 2023-04-10 LAB — CBC (CANCER CENTER ONLY)
HCT: 35.5 % — ABNORMAL LOW (ref 39.0–52.0)
Hemoglobin: 11.7 g/dL — ABNORMAL LOW (ref 13.0–17.0)
MCH: 37.6 pg — ABNORMAL HIGH (ref 26.0–34.0)
MCHC: 33 g/dL (ref 30.0–36.0)
MCV: 114.1 fL — ABNORMAL HIGH (ref 80.0–100.0)
Platelet Count: 169 10*3/uL (ref 150–400)
RBC: 3.11 MIL/uL — ABNORMAL LOW (ref 4.22–5.81)
RDW: 14.8 % (ref 11.5–15.5)
WBC Count: 6.6 10*3/uL (ref 4.0–10.5)
nRBC: 0 % (ref 0.0–0.2)

## 2023-04-10 LAB — LACTATE DEHYDROGENASE: LDH: 166 U/L (ref 98–192)

## 2023-04-10 LAB — CMP (CANCER CENTER ONLY)
ALT: 26 U/L (ref 0–44)
AST: 29 U/L (ref 15–41)
Albumin: 4.1 g/dL (ref 3.5–5.0)
Alkaline Phosphatase: 51 U/L (ref 38–126)
Anion gap: 11 (ref 5–15)
BUN: 20 mg/dL (ref 8–23)
CO2: 25 mmol/L (ref 22–32)
Calcium: 9.1 mg/dL (ref 8.9–10.3)
Chloride: 104 mmol/L (ref 98–111)
Creatinine: 1.24 mg/dL (ref 0.61–1.24)
GFR, Estimated: 58 mL/min — ABNORMAL LOW (ref >60)
Glucose, Bld: 128 mg/dL — ABNORMAL HIGH (ref 70–99)
Potassium: 4.3 mmol/L (ref 3.5–5.1)
Sodium: 140 mmol/L (ref 135–145)
Total Bilirubin: 0.4 mg/dL (ref <1.2)
Total Protein: 7.7 g/dL (ref 6.5–8.1)

## 2023-04-13 LAB — KAPPA/LAMBDA LIGHT CHAINS
Kappa free light chain: 41.1 mg/L — ABNORMAL HIGH (ref 3.3–19.4)
Kappa, lambda light chain ratio: 2.67 — ABNORMAL HIGH (ref 0.26–1.65)
Lambda free light chains: 15.4 mg/L (ref 5.7–26.3)

## 2023-04-17 LAB — MULTIPLE MYELOMA PANEL, SERUM
Albumin SerPl Elph-Mcnc: 3.7 g/dL (ref 2.9–4.4)
Albumin/Glob SerPl: 1.1 (ref 0.7–1.7)
Alpha 1: 0.3 g/dL (ref 0.0–0.4)
Alpha2 Glob SerPl Elph-Mcnc: 0.8 g/dL (ref 0.4–1.0)
B-Globulin SerPl Elph-Mcnc: 1.5 g/dL — ABNORMAL HIGH (ref 0.7–1.3)
Gamma Glob SerPl Elph-Mcnc: 0.8 g/dL (ref 0.4–1.8)
Globulin, Total: 3.4 g/dL (ref 2.2–3.9)
IgA: 725 mg/dL — ABNORMAL HIGH (ref 61–437)
IgG (Immunoglobin G), Serum: 1067 mg/dL (ref 603–1613)
IgM (Immunoglobulin M), Srm: 55 mg/dL (ref 15–143)
M Protein SerPl Elph-Mcnc: 0.4 g/dL — ABNORMAL HIGH
Total Protein ELP: 7.1 g/dL (ref 6.0–8.5)

## 2023-04-24 ENCOUNTER — Encounter: Payer: Self-pay | Admitting: Nurse Practitioner

## 2023-04-24 ENCOUNTER — Inpatient Hospital Stay: Payer: Medicare Other | Admitting: Nurse Practitioner

## 2023-04-24 VITALS — BP 120/73 | HR 63 | Temp 98.6°F | Resp 19 | Wt 195.3 lb

## 2023-04-24 DIAGNOSIS — Z8673 Personal history of transient ischemic attack (TIA), and cerebral infarction without residual deficits: Secondary | ICD-10-CM | POA: Diagnosis not present

## 2023-04-24 DIAGNOSIS — G8929 Other chronic pain: Secondary | ICD-10-CM | POA: Diagnosis not present

## 2023-04-24 DIAGNOSIS — N1832 Chronic kidney disease, stage 3b: Secondary | ICD-10-CM | POA: Diagnosis not present

## 2023-04-24 DIAGNOSIS — I252 Old myocardial infarction: Secondary | ICD-10-CM | POA: Diagnosis not present

## 2023-04-24 DIAGNOSIS — D631 Anemia in chronic kidney disease: Secondary | ICD-10-CM | POA: Diagnosis not present

## 2023-04-24 DIAGNOSIS — D472 Monoclonal gammopathy: Secondary | ICD-10-CM

## 2023-04-24 DIAGNOSIS — J449 Chronic obstructive pulmonary disease, unspecified: Secondary | ICD-10-CM | POA: Diagnosis not present

## 2023-04-24 DIAGNOSIS — Z7982 Long term (current) use of aspirin: Secondary | ICD-10-CM | POA: Diagnosis not present

## 2023-04-24 DIAGNOSIS — Z79899 Other long term (current) drug therapy: Secondary | ICD-10-CM | POA: Diagnosis not present

## 2023-04-24 DIAGNOSIS — M549 Dorsalgia, unspecified: Secondary | ICD-10-CM | POA: Diagnosis not present

## 2023-04-24 DIAGNOSIS — R413 Other amnesia: Secondary | ICD-10-CM | POA: Diagnosis not present

## 2023-04-24 DIAGNOSIS — K219 Gastro-esophageal reflux disease without esophagitis: Secondary | ICD-10-CM | POA: Diagnosis not present

## 2023-04-24 DIAGNOSIS — D469 Myelodysplastic syndrome, unspecified: Secondary | ICD-10-CM | POA: Diagnosis not present

## 2023-04-24 DIAGNOSIS — E7800 Pure hypercholesterolemia, unspecified: Secondary | ICD-10-CM | POA: Diagnosis not present

## 2023-04-24 DIAGNOSIS — I129 Hypertensive chronic kidney disease with stage 1 through stage 4 chronic kidney disease, or unspecified chronic kidney disease: Secondary | ICD-10-CM | POA: Diagnosis not present

## 2023-04-24 DIAGNOSIS — I4891 Unspecified atrial fibrillation: Secondary | ICD-10-CM | POA: Diagnosis not present

## 2023-04-24 DIAGNOSIS — R634 Abnormal weight loss: Secondary | ICD-10-CM | POA: Diagnosis not present

## 2023-04-24 DIAGNOSIS — M129 Arthropathy, unspecified: Secondary | ICD-10-CM | POA: Diagnosis not present

## 2023-04-24 DIAGNOSIS — D46Z Other myelodysplastic syndromes: Secondary | ICD-10-CM

## 2023-04-24 DIAGNOSIS — J4489 Other specified chronic obstructive pulmonary disease: Secondary | ICD-10-CM | POA: Diagnosis not present

## 2023-04-24 DIAGNOSIS — J45909 Unspecified asthma, uncomplicated: Secondary | ICD-10-CM | POA: Diagnosis not present

## 2023-04-24 DIAGNOSIS — Z87891 Personal history of nicotine dependence: Secondary | ICD-10-CM | POA: Diagnosis not present

## 2023-04-24 DIAGNOSIS — C9000 Multiple myeloma not having achieved remission: Secondary | ICD-10-CM | POA: Diagnosis not present

## 2023-04-24 DIAGNOSIS — I2510 Atherosclerotic heart disease of native coronary artery without angina pectoris: Secondary | ICD-10-CM | POA: Diagnosis not present

## 2023-04-24 NOTE — Progress Notes (Signed)
[facility] CONSULT NOTE  Keith Cruz Care Team: [name], NP as PCP - General (Nurse Practitioner) Earna Coder, MD as Consulting Physician (Oncology) Alwyn Pea, MD as Consulting Physician (Cardiology) Wyn Quaker Marlow Baars, MD as Referring Physician (Vascular Surgery) Cantwell, Renne Musca, PA-C  CHIEF COMPLAINTS/PURPOSE OF CONSULTATION: ANEMIA  HEMATOLOGY HISTORY  # ANEMIA[Hb; MCV-platelets- WBC; Iron sat; ferritin;  GFR- CT/US; EGD/colonoscopy-  # Anemia: Hemoglobin:[[name] 2023] 12 MCV-110; normal white count/platelets.   [name] 2024-B12 folic acid normal [PCP]; FEB 7th, CT scan AP-negative for hepatosplenomegaly or cirrhosis.  MARCH 2024-bone marrow biopsy hypercellular; clonal plasma cells noted 6%; M-protein negative; kappa lambda light chain ratio= slightly abnormal;  myeloma panel FISH panel negative.  Cytogenetics normal.  MARCH 2024- foundation 1 heme-MDS panel.  HISTORY OF PRESENTING ILLNESS: HOH. Confusion. Wheelchair d/t back pain. Accompanied by wife who provides majority of history   Keith Cruz 82 y.o.  male Keith Cruz with multiple medical problems including COPD/A-fib on Eliquis CKD stage III and chronic macrocytic anemia-is here for follow up. Keith Cruz has ongoing changes in mentation. Wife concerned because Keith Cruz saw neurology and had RPR which was equivocal. She had repeat testing with PCP that was negative. Wife declined recommended appointment with infectious disease as she feels strongly that Keith Cruz does not have syphilis. However, Keith Cruz continues to have back/hip pain. She believes it is all related to his leukemia and multiple myeloma and questions when Keith Cruz will start treatment. Keith Cruz has a significant family history of dementia but has never been diagnosed. Keith Cruz has not been back to see neurology. Has had MRI brain. Had a fall recently. Memory loss has worsened over past year.   Keith Cruz has lost weight. Denies night sweats. No new lumps or bumps.     Review of Systems   Unable to perform ROS: Dementia (and hard of hearing.)  Constitutional:  Positive for malaise/fatigue and weight loss. Negative for chills, diaphoresis and fever.  HENT:  Positive for hearing loss. Negative for ear pain and nosebleeds.   Eyes:  Negative for double vision.  Respiratory:  Negative for shortness of breath.   Gastrointestinal:  Negative for abdominal pain, blood in stool, constipation, diarrhea, heartburn, melena, nausea and vomiting.  Musculoskeletal:  Positive for back pain, falls and joint pain. Negative for myalgias and neck pain.  Skin:  Negative for itching and rash.  Neurological:  Positive for dizziness. Negative for tingling, focal weakness, loss of consciousness, weakness and headaches.  Endo/Heme/Allergies:  Does not bruise/bleed easily.  Psychiatric/Behavioral:  Positive for memory loss. Negative for depression. The Keith Cruz is not nervous/anxious.     MEDICAL HISTORY:  Past Medical History:  Diagnosis Date   Arthritis    back   Asthma    Atherosclerosis of aorta (HCC)    Atherosclerosis of aorta (HCC)    Bronchitis 05/2017   chronic   CAD (coronary artery disease)    Carotid artery disease (HCC)    Carotid stenosis    Carotid stenosis, bilateral    Chronic bronchitis (HCC)    Chronic cough    COPD (chronic obstructive pulmonary disease) (HCC)    Dysrhythmia    GERD (gastroesophageal reflux disease)    Heart murmur    HOH (hard of hearing)    hearing aids   Hypercholesteremia    Hypertension    Kidney disease    stent   Macular degeneration of left eye    Myocardial infarction (HCC) 2016   Pre-diabetes    Seasonal allergies  Seizure (HCC)    Seizures (HCC)    viral meningitis   Stroke [facility])    1991 and imaging 05/12/15 old strokes   TIA (transient ischemic attack) 1991   x2   Uses hearing aid    Viral meningitis 2017   Viral meningitis    Wears dentures    upper and lower    SURGICAL HISTORY: Past Surgical History:  Procedure  Laterality Date   CAROTID ENDARTERECTOMY Left 2014   CAROTID ENDARTERECTOMY Right 1991   CAROTID PTA/STENT INTERVENTION Left 05/31/2020   Procedure: CAROTID PTA/STENT INTERVENTIO;  Surgeon: Annice Needy, MD;  Location: [facility] INVASIVE CV LAB;  Service: Cardiovascular;  Laterality: Left;   CATARACT EXTRACTION W/PHACO Left 06/24/2017   Procedure: CATARACT EXTRACTION PHACO AND INTRAOCULAR LENS PLACEMENT (IOC) LEFT BORDERLINE DIABETIC;  Surgeon: Lockie Mola, MD;  Location: [facility];  Service: Ophthalmology;  Laterality: Left;   CATARACT EXTRACTION W/PHACO Right 07/22/2017   Procedure: CATARACT EXTRACTION PHACO AND INTRAOCULAR LENS PLACEMENT (IOC) RIGHT BORDERLINE DIABETIC;  Surgeon: Lockie Mola, MD;  Location: [facility];  Service: Ophthalmology;  Laterality: Right;   CORONARY ARTERY BYPASS GRAFT     x5   SKIN LESION EXCISION Left    left ear   URETERAL STENT PLACEMENT      SOCIAL HISTORY: Social History   Socioeconomic History   Marital status: Married    Spouse name: Not on file   Number of children: Not on file   Years of education: Not on file   Highest education level: 10th grade  Occupational History   Not on file  Tobacco Use   Smoking status: Former    Current packs/day: 0.00    Average packs/day: 2.0 packs/day for 10.0 years (20.0 ttl pk-yrs)    Types: Cigarettes    Start date: 04/28/1960    Quit date: 04/28/1970    Years since quitting: 53.0   Smokeless tobacco: Former  Building services engineer status: Never Used  Substance and Sexual Activity   Alcohol use: No    Alcohol/week: 0.0 standard drinks of alcohol    Comment: previous   Drug use: No   Sexual activity: Yes    Comment: wife   Other Topics Concern   Not on file  Social History Narrative   Re-married x 3 years wife named Audiological scientist pets, 1 dog.    Retired    Investment banker, operational, wears seat belt, safe in relationship    2 sons and 1 daughter    6 years [employer]    Social  Drivers of Corporate investment banker Strain: Low Risk  (02/13/2023)   Overall Financial Resource Strain (CARDIA)    Difficulty of Paying Living Expenses: Not hard at all  Food Insecurity: No Food Insecurity (02/13/2023)   Hunger Vital Sign    Worried About Running Out of Food in the Last Year: Never true    Ran Out of Food in the Last Year: Never true  Transportation Needs: No Transportation Needs (02/13/2023)   PRAPARE - Administrator, Civil Service (Medical): No    Lack of Transportation (Non-Medical): No  Physical Activity: Inactive (02/13/2023)   Exercise Vital Sign    Days of Exercise per Week: 0 days    Minutes of Exercise per Session: 0 min  Stress: Stress Concern Present (02/13/2023)   [employer] of Occupational Health - Occupational Stress Questionnaire    Feeling of Stress : To some extent  Social Connections: Moderately Isolated (02/13/2023)   Social Connection and Isolation Panel [NHANES]    Frequency of Communication with Friends and Family: More than three times a week    Frequency of Social Gatherings with Friends and Family: Once a week    Attends Religious Services: Never    Database administrator or Organizations: No    Attends Banker Meetings: Never    Marital Status: Married  Catering manager Violence: Not At Risk (02/16/2023)   Humiliation, Afraid, Rape, and Kick questionnaire    Fear of Current or Ex-Partner: No    Emotionally Abused: No    Physically Abused: No    Sexually Abused: No    FAMILY HISTORY: Family History  Problem Relation Age of Onset   Stroke Mother    Heart attack Mother    Dementia Mother    Stroke Father    Stroke Sister    Alzheimer's disease Sister    Dementia Sister    Dementia Sister    Stroke Brother    Alzheimer's disease Maternal Grandmother    Dementia Maternal Grandmother    Stroke Paternal Grandmother    Stroke Paternal Grandfather    Alzheimer's disease Paternal Grandfather     Cancer Neg Hx     ALLERGIES:  is allergic to fexofenadine-pseudoephed er, nifedipine, pseudoephedrine, anoro ellipta [umeclidinium-vilanterol], carvedilol, doxycycline hyclate, prednisone, and roflumilast.  MEDICATIONS:  Current Outpatient Medications  Medication Sig Dispense Refill   acetaminophen (TYLENOL) 500 MG tablet Take 500 mg by mouth every 6 (six) hours as needed.     albuterol (PROAIR HFA) 108 (90 Base) MCG/ACT inhaler INHALE 1-2 PUFFS INTO THE LUNGS EVERY 4 (FOUR) HOURS AS NEEDED FOR WHEEZING OR SHORTNESS OF BREATH. 18 each 12   apixaban (ELIQUIS) 5 MG TABS tablet Take 1 tablet (5 mg total) by mouth 2 (two) times daily. 60 tablet 5   ARNUITY ELLIPTA 100 MCG/ACT AEPB Inhale 1 puff into the lungs daily.     aspirin EC 81 MG tablet Take 81 mg by mouth daily.     atorvastatin (LIPITOR) 80 MG tablet Take 1 tablet (80 mg total) by mouth daily. 90 tablet 3   blood glucose meter kit and supplies Dispense based on Keith Cruz and insurance preference. Use as directed to check blood glucose once daily. 1 each 0   Cholecalciferol (VITAMIN D3) 2000 UNITS capsule Take 2,000 Units by mouth daily. am     fexofenadine (ALLEGRA) 180 MG tablet Take 1 tablet (180 mg total) by mouth daily as needed for allergies or rhinitis. 90 tablet 3   fluticasone (FLONASE) 50 MCG/ACT nasal spray SPRAY 1 SPRAY INTO BOTH NOSTRILS DAILY. 16 mL 2   folic acid (FOLVITE) 1 MG tablet Take 1 mg by mouth daily. am     glucose blood test strip Test blood glucose once daily 100 each 12   hydrALAZINE (APRESOLINE) 50 MG tablet Take 1 tablet (50 mg total) by mouth 3 (three) times daily. 270 tablet 3   metoprolol tartrate (LOPRESSOR) 50 MG tablet Take 1 tablet (50 mg total) by mouth 2 (two) times daily. D/c coreg pt prefers metoprolol 180 tablet 3   Multiple Vitamins-Minerals (CENTRUM SILVER 50+MEN PO) Take by mouth.     tamsulosin (FLOMAX) 0.4 MG CAPS capsule Take 0.4 mg by mouth daily.     triamcinolone cream (KENALOG) 0.1 % APPLY  TO AFFECTED AREA TWICE A DAY 30 g 5   Accu-Chek Softclix Lancets lancets Test blood glucose once daily 100  each 12   traZODone (DESYREL) 50 MG tablet Take 50 mg by mouth at bedtime as needed.     Current Facility-Administered Medications  Medication Dose Route Frequency Provider Last Rate Last Admin   ipratropium-albuterol (DUONEB) 0.5-2.5 (3) MG/3ML nebulizer solution 3 mL  3 mL Nebulization Once Karamalegos, Alexander J, DO          PHYSICAL EXAMINATION: Vitals:   04/24/23 1059  BP: 120/73  Pulse: 63  Resp: 19  Temp: 98.6 F (37 C)  SpO2: 98%   Filed Weights   04/24/23 1059  Weight: 195 lb 4.8 oz (88.6 kg)   Physical Exam Vitals reviewed.  Constitutional:      Appearance: Keith Cruz is not ill-appearing.     Comments: wheelchair  HENT:     Head: Normocephalic and atraumatic.     Mouth/Throat:     Pharynx: Oropharynx is clear.  Eyes:     General: No scleral icterus.    Extraocular Movements: Extraocular movements intact.     Pupils: Pupils are equal, round, and reactive to light.  Pulmonary:     Effort: No respiratory distress.  Abdominal:     General: There is no distension.     Palpations: Abdomen is soft.  Musculoskeletal:        General: No deformity.  Skin:    General: Skin is warm.  Neurological:     Mental Status: Keith Cruz is alert.     Comments: Disordered conversation. Unclear orientation vs HOH. Not able to participate in conversation consistently.   Psychiatric:        Mood and Affect: Affect is blunt.        Judgment: Judgment is impulsive.      LABORATORY DATA:  I have reviewed the data as listed Lab Results  Component Value Date   WBC 6.6 04/10/2023   HGB 11.7 (L) 04/10/2023   HCT 35.5 (L) 04/10/2023   MCV 114.1 (H) 04/10/2023   PLT 169 04/10/2023   Recent Labs    11/12/22 0917 12/09/22 1058 12/10/22 0308 04/10/23 1027  NA 140 136 137 140  K 4.6 3.9 3.7 4.3  CL 104 105 106 104  CO2 29 22 23 25   GLUCOSE 114* 136* 115* 128*  BUN 19 22 18 20    CREATININE 1.37 1.27* 1.23 1.24  CALCIUM 9.5 8.9 8.5* 9.1  GFRNONAA  --  56* 59* 58*  PROT 7.4 7.8  --  7.7  ALBUMIN 4.1 4.2  --  4.1  AST 23 29  --  29  ALT 17 20  --  26  ALKPHOS 51 49  --  51  BILITOT 0.6 0.7  --  0.4    No results found.  ASSESSMENT & PLAN:   # Anemia: Hemoglobin:[[name] 2023] 12 MCV-110; normal white count/platelets.   [name] 2024-B12 folic acid normal [PCP]; FEB 7th 2024- CT d/t unintentional weight loss negative for and anemia- negative for hepatosplenomegaly or cirrhosis.    # MARCH 2024-low-grade MDS - bone marrow biopsy hypercellular; clonal plasma cells noted; myeloma panel FISH panel negative.  Cytogenetics normal.  Recommend foundation 1 heme-MDS panel. Unclear if this was drawn or if results are pending. I will reach out to Dr. Sharlette Dense team to follow up.    # Lengthy discussion today regarding pathophysiology of low grade MDS with Keith Cruz and his wife. Clinically, no obvious symptoms and I would recommend surveillance. Reviewed that sometimes MDS can turn into leukemia but I do not see any evidence of leukemia currently  or in the past. Reviewed that MDS is considered malignancy, however no current recommendation for treatment and MDS was low risk. Reviewed Keith Cruz resources for reading through [employer] and also provided her with printed materials today. Suspect this is indolent disease and we monitor him for any marked progression of cytopenias or increases in blasts that would raise concern for need for treatment.   # MGUS: immunofixation shows IgA monoclonal protein with kappa light chain specificity.  Kappa lambda light chain ratio= 2.72.  Bone marrow biopsy- March 2024-6% plasma cells.  Lengthy discussion regarding MGUS vs multiple myeloma. I do not see that Keith Cruz has met diagnostic criteria or had previous diagnosis of multiple myeloma now or in the past. Today we reviewed MGUS pathophysiology in detail and small, < 5% annually, risk  of progression to MM. No evidence of hypercalcemia, no renal dysfunction (improved), anemia is stable. Keith Cruz has bone pain which is chronic. M spike 0.4. Kappa lambda light chain ratio 2.67 which is stable. Recommend surveillance with SPEP and kappa lambda light chains every 6 months.   # Confusion/memory impairment- etiology unclear but chronic over past year and worsening. Recommended follow up with neurology and workup with ID if that's what was recommended. Keith Cruz had positive treponemal and negative nontreponemal test for syphilis. Reviewed discordant results. Keith Cruz denies history of treated or untreated syphilis. Strongly recommend Keith Cruz follow up with ID and neurology for recommended workup.   # Weight loss- follow up with PCP as this may be related to mentation.    # COPD /cough-defer to pulmonary/Dr.Fleming-for further recommendations. Stable.    # Chronic kidney disease stage III-[Dr.Singh]-Stable.    # Chronic back pain- unrelated to MDS and MGUS. Recommend follow up with ortho.    # A.fib/CAD [Dr.Kowalski/[facility]] - on eliquis- stable.   # Fall- risk vs benefit of continued eliquis. Defer to cardiology. Encouraged wife to see them for follow up.    # DISPOSITION: 6 mo- lab (cbc, cmp, ldh, SPEP, K/L Light chains), Dr Donneta Romberg- [place]  No problem-specific Assessment & Plan notes found for this encounter.  All questions were answered. The Keith Cruz knows to call the clinic with any problems, questions or concerns.  Alinda Dooms, NP 04/24/2023   CC: [name], NP, Elvin So, PA, Dr Donneta Romberg

## 2023-05-15 ENCOUNTER — Encounter: Payer: Self-pay | Admitting: Nurse Practitioner

## 2023-05-15 ENCOUNTER — Ambulatory Visit: Payer: Medicare Other | Admitting: Nurse Practitioner

## 2023-05-15 VITALS — BP 118/58 | HR 56 | Temp 98.3°F | Ht 76.0 in | Wt 196.4 lb

## 2023-05-15 DIAGNOSIS — R413 Other amnesia: Secondary | ICD-10-CM

## 2023-05-15 DIAGNOSIS — R296 Repeated falls: Secondary | ICD-10-CM | POA: Diagnosis not present

## 2023-05-15 DIAGNOSIS — D46Z Other myelodysplastic syndromes: Secondary | ICD-10-CM

## 2023-05-15 DIAGNOSIS — I10 Essential (primary) hypertension: Secondary | ICD-10-CM

## 2023-05-15 DIAGNOSIS — M2769 Other endosseous dental implant failure: Secondary | ICD-10-CM | POA: Diagnosis not present

## 2023-05-15 DIAGNOSIS — I480 Paroxysmal atrial fibrillation: Secondary | ICD-10-CM

## 2023-05-15 DIAGNOSIS — N1831 Chronic kidney disease, stage 3a: Secondary | ICD-10-CM

## 2023-05-15 DIAGNOSIS — R42 Dizziness and giddiness: Secondary | ICD-10-CM

## 2023-05-15 DIAGNOSIS — J432 Centrilobular emphysema: Secondary | ICD-10-CM

## 2023-05-15 NOTE — Progress Notes (Signed)
Keith Dicker, NP-C Phone: [phone]  [name] is a 83 y.o. male who presents today for follow up.   Discussed the use of AI scribe software for clinical note transcription with the patient, who gave verbal consent to proceed.  History of Present Illness   The patient, with a history of MDS, COPD and memory impairment presents with concerns about worsening memory and frequent falls. He reports difficulty remembering recent events, such as appointments, despite being able to recall events from several decades ago. This has led to increased anxiety, particularly when his spouse is not present. The patient has also decided to stop driving due to fear of forgetting how to return home.  The patient has been experiencing frequent falls, approximately every couple of weeks, primarily upon rising from a seated position. He uses a cane for stabilization, which he reports as helpful. The falls occur mostly at home, as the patient prefers to stay in due to his worsening memory and a preference for solitude.  The patient also reports dizziness, which has been a long-standing issue but has become more frequent recently. This dizziness occurs both when transitioning from sitting to standing and during ambulation.  The patient has been experiencing decreased appetite and mild weight loss. He reports difficulty eating due to lack of teeth on the bottom gums, which may be contributing to the weight loss.  The patient is on multiple medications, including Eliquis, Lipitor, blood pressure medications, Flomax, and Trazodone. He reports no issues with medication compliance. He denies any chest pain or shortness of breath. He uses inhalers for respiratory management, but reports infrequent use of albuterol. He denies any coughing.  The patient has been seen by multiple specialists, including oncology, nephrology, cardiology, and pulmonology. He has not followed up with neurology since an MRI in September, which  showed no tumors. He is scheduled to see nephrology later this month.      Social History   Tobacco Use  Smoking Status Former   Current packs/day: 0.00   Average packs/day: 2.0 packs/day for 10.0 years (20.0 ttl pk-yrs)   Types: Cigarettes   Start date: 04/28/1960   Quit date: 04/28/1970   Years since quitting: 53.1  Smokeless Tobacco Former    Current Outpatient Medications on File Prior to Visit  Medication Sig Dispense Refill   acetaminophen (TYLENOL) 500 MG tablet Take 500 mg by mouth every 6 (six) hours as needed.     albuterol (PROAIR HFA) 108 (90 Base) MCG/ACT inhaler INHALE 1-2 PUFFS INTO THE LUNGS EVERY 4 (FOUR) HOURS AS NEEDED FOR WHEEZING OR SHORTNESS OF BREATH. 18 each 12   apixaban (ELIQUIS) 5 MG TABS tablet Take 1 tablet (5 mg total) by mouth 2 (two) times daily. 60 tablet 5   ARNUITY ELLIPTA 100 MCG/ACT AEPB Inhale 1 puff into the lungs daily.     aspirin EC 81 MG tablet Take 81 mg by mouth daily.     atorvastatin (LIPITOR) 80 MG tablet Take 1 tablet (80 mg total) by mouth daily. 90 tablet 3   blood glucose meter kit and supplies Dispense based on patient and insurance preference. Use as directed to check blood glucose once daily. 1 each 0   Cholecalciferol (VITAMIN D3) 2000 UNITS capsule Take 2,000 Units by mouth daily. am     fexofenadine (ALLEGRA) 180 MG tablet Take 1 tablet (180 mg total) by mouth daily as needed for allergies or rhinitis. 90 tablet 3   fluticasone (FLONASE) 50 MCG/ACT nasal spray SPRAY 1  SPRAY INTO BOTH NOSTRILS DAILY. 16 mL 2   folic acid (FOLVITE) 1 MG tablet Take 1 mg by mouth daily. am     glucose blood test strip Test blood glucose once daily 100 each 12   hydrALAZINE (APRESOLINE) 50 MG tablet Take 1 tablet (50 mg total) by mouth 3 (three) times daily. 270 tablet 3   metoprolol tartrate (LOPRESSOR) 50 MG tablet Take 1 tablet (50 mg total) by mouth 2 (two) times daily. D/c coreg pt prefers metoprolol 180 tablet 3   Multiple Vitamins-Minerals  (CENTRUM SILVER 50+MEN PO) Take by mouth.     tamsulosin (FLOMAX) 0.4 MG CAPS capsule Take 0.4 mg by mouth daily.     traZODone (DESYREL) 50 MG tablet Take 50 mg by mouth at bedtime as needed.     triamcinolone cream (KENALOG) 0.1 % APPLY TO AFFECTED AREA TWICE A DAY 30 g 5   Current Facility-Administered Medications on File Prior to Visit  Medication Dose Route Frequency Provider Last Rate Last Admin   ipratropium-albuterol (DUONEB) 0.5-2.5 (3) MG/3ML nebulizer solution 3 mL  3 mL Nebulization Once Smitty Cords, DO        ROS see history of present illness  Objective  Physical Exam Vitals:   05/15/23 1054  BP: (!) 118/58  Pulse: (!) 56  Temp: 98.3 F (36.8 C)  SpO2: 96%    BP Readings from Last 3 Encounters:  05/15/23 (!) 118/58  04/24/23 120/73  02/26/23 128/60   Wt Readings from Last 3 Encounters:  05/15/23 196 lb 6.4 oz (89.1 kg)  04/24/23 195 lb 4.8 oz (88.6 kg)  02/26/23 199 lb 3.2 oz (90.4 kg)    Physical Exam Constitutional:      General: He is not in acute distress.    Appearance: Normal appearance.  HENT:     Head: Normocephalic.  Cardiovascular:     Rate and Rhythm: Normal rate and regular rhythm.     Heart sounds: Normal heart sounds.  Pulmonary:     Effort: Pulmonary effort is normal.     Breath sounds: Normal breath sounds.  Skin:    General: Skin is warm and dry.  Neurological:     General: No focal deficit present.     Mental Status: He is alert.  Psychiatric:        Mood and Affect: Mood normal.        Behavior: Behavior normal.        Cognition and Memory: He exhibits impaired recent memory.    Assessment/Plan: Please see individual problem list.  Memory loss or impairment Assessment & Plan: Memory is worsening, particularly short-term, with increased confusion and disorientation. He has stopped driving due to fear of forgetting the way home and reports frequent falls, approximately every couple of weeks. He was evaluated by  Neurology in September but has not followed up. Encouraged to schedule follow up appt with Neurology. Offered community resources/support such as social work, wife and patient politely declined at this time as they feel they do not need the assistance.    Frequent falls Assessment & Plan: Falls occur approximately every couple of weeks, both at home and outside. A cane is used for stability. Continue using the cane and ensure the home environment is safe and free of fall risks. Consider PT/OT.   Disorder of dental prosthesis Assessment & Plan: There are no teeth on the bottom, contributing to difficulty eating and subsequent weight loss. Weight loss of 3 pounds since October. Gums have  not healed well for denture adjustments since fall in October. Continue follow-up with the dentist for denture adjustments as gums heal. Encouraged soft, easy to chew foods and liquids.    Essential hypertension Assessment & Plan: Chronic. Stable on Lopressor 50 mg BID and Hydralazine 50 mg TID. Continue. Follow up with Cardiology.    MDS (myelodysplastic syndrome), low grade (HCC) Assessment & Plan: Managed by Hematology/Oncology. Last office visit 04/24/23. Current plan of surveillance for 6 months then will repeat lab work. Follow up as scheduled.    Centrilobular emphysema (HCC) Assessment & Plan: Stable on Arnuity daily and Albuterol PRN. Continue. Follow up with Pulmonology as scheduled.    Paroxysmal atrial fibrillation (HCC) Assessment & Plan: Stable on Eliquis 5 mg BID. Continue. Follow up with Cardiology and Vascular as scheduled.    Stage 3a chronic kidney disease (HCC) Assessment & Plan: Follow up with Nephrology.     Return in about 6 months (around 11/12/2023) for Follow up.   Keith Dicker, NP-C [facility]

## 2023-05-22 ENCOUNTER — Encounter: Payer: Self-pay | Admitting: Nurse Practitioner

## 2023-05-22 DIAGNOSIS — R296 Repeated falls: Secondary | ICD-10-CM | POA: Insufficient documentation

## 2023-05-22 DIAGNOSIS — M2769 Other endosseous dental implant failure: Secondary | ICD-10-CM | POA: Insufficient documentation

## 2023-05-22 NOTE — Assessment & Plan Note (Signed)
Follow up with Nephrology

## 2023-05-22 NOTE — Assessment & Plan Note (Signed)
There are no teeth on the bottom, contributing to difficulty eating and subsequent weight loss. Weight loss of 3 pounds since October. Gums have not healed well for denture adjustments since fall in October. Continue follow-up with the dentist for denture adjustments as gums heal. Encouraged soft, easy to chew foods and liquids.

## 2023-05-22 NOTE — Assessment & Plan Note (Signed)
Stable on Eliquis 5 mg BID. Continue. Follow up with Cardiology and Vascular as scheduled.

## 2023-05-22 NOTE — Assessment & Plan Note (Signed)
Chronic. Stable on Lopressor 50 mg BID and Hydralazine 50 mg TID. Continue. Follow up with Cardiology.

## 2023-05-22 NOTE — Assessment & Plan Note (Signed)
Falls occur approximately every couple of weeks, both at home and outside. A cane is used for stability. Continue using the cane and ensure the home environment is safe and free of fall risks. Consider PT/OT.

## 2023-05-22 NOTE — Assessment & Plan Note (Signed)
Memory is worsening, particularly short-term, with increased confusion and disorientation. He has stopped driving due to fear of forgetting the way home and reports frequent falls, approximately every couple of weeks. He was evaluated by Neurology in September but has not followed up. Encouraged to schedule follow up appt with Neurology. Offered community resources/support such as social work, wife and patient politely declined at this time as they feel they do not need the assistance.

## 2023-05-22 NOTE — Assessment & Plan Note (Addendum)
Managed by Hematology/Oncology. Last office visit 04/24/23. Current plan of surveillance for 6 months then will repeat lab work. Follow up as scheduled.

## 2023-05-22 NOTE — Assessment & Plan Note (Signed)
Stable on Arnuity daily and Albuterol PRN. Continue. Follow up with Pulmonology as scheduled.

## 2023-05-25 DIAGNOSIS — N1832 Chronic kidney disease, stage 3b: Secondary | ICD-10-CM | POA: Diagnosis not present

## 2023-05-25 DIAGNOSIS — N1831 Chronic kidney disease, stage 3a: Secondary | ICD-10-CM | POA: Diagnosis not present

## 2023-05-25 DIAGNOSIS — D631 Anemia in chronic kidney disease: Secondary | ICD-10-CM | POA: Diagnosis not present

## 2023-05-25 DIAGNOSIS — I10 Essential (primary) hypertension: Secondary | ICD-10-CM | POA: Diagnosis not present

## 2023-05-25 DIAGNOSIS — I129 Hypertensive chronic kidney disease with stage 1 through stage 4 chronic kidney disease, or unspecified chronic kidney disease: Secondary | ICD-10-CM | POA: Diagnosis not present

## 2023-05-26 DIAGNOSIS — I701 Atherosclerosis of renal artery: Secondary | ICD-10-CM | POA: Diagnosis not present

## 2023-05-26 DIAGNOSIS — I10 Essential (primary) hypertension: Secondary | ICD-10-CM | POA: Diagnosis not present

## 2023-05-26 DIAGNOSIS — I129 Hypertensive chronic kidney disease with stage 1 through stage 4 chronic kidney disease, or unspecified chronic kidney disease: Secondary | ICD-10-CM | POA: Diagnosis not present

## 2023-05-26 DIAGNOSIS — D631 Anemia in chronic kidney disease: Secondary | ICD-10-CM | POA: Diagnosis not present

## 2023-05-26 DIAGNOSIS — N1831 Chronic kidney disease, stage 3a: Secondary | ICD-10-CM | POA: Diagnosis not present

## 2023-05-28 DIAGNOSIS — R42 Dizziness and giddiness: Secondary | ICD-10-CM | POA: Diagnosis not present

## 2023-05-28 DIAGNOSIS — R531 Weakness: Secondary | ICD-10-CM | POA: Diagnosis not present

## 2023-05-28 DIAGNOSIS — N1831 Chronic kidney disease, stage 3a: Secondary | ICD-10-CM | POA: Diagnosis not present

## 2023-05-28 DIAGNOSIS — R270 Ataxia, unspecified: Secondary | ICD-10-CM | POA: Diagnosis not present

## 2023-05-28 DIAGNOSIS — I10 Essential (primary) hypertension: Secondary | ICD-10-CM | POA: Diagnosis not present

## 2023-05-28 DIAGNOSIS — I129 Hypertensive chronic kidney disease with stage 1 through stage 4 chronic kidney disease, or unspecified chronic kidney disease: Secondary | ICD-10-CM | POA: Diagnosis not present

## 2023-05-28 DIAGNOSIS — R413 Other amnesia: Secondary | ICD-10-CM | POA: Diagnosis not present

## 2023-05-28 DIAGNOSIS — D631 Anemia in chronic kidney disease: Secondary | ICD-10-CM | POA: Diagnosis not present

## 2023-05-28 DIAGNOSIS — I701 Atherosclerosis of renal artery: Secondary | ICD-10-CM | POA: Diagnosis not present

## 2023-06-23 ENCOUNTER — Ambulatory Visit: Payer: Self-pay | Admitting: Nurse Practitioner

## 2023-06-23 NOTE — Telephone Encounter (Signed)
 Copied from CRM [id number]. Topic: Clinical - Red Word Triage >> Jun 23, 2023  8:54 AM Orinda Kenner C wrote: Red Word that prompted transfer to Nurse Triage: Patient's wise Marylene Land [phone] states patient upper chest pain, chest congestion, vomiting, dizziness, and really weak/fatigue. Patient does not have a fever, nor shortness of breath. Patient needs to be seen.   Chief Complaint: Vomiting Symptoms: Congestion, Vomiting,  Frequency: Acute Pertinent Negatives: Patient denies fever, diarrhea, abdominal pain,  Disposition: [] ED /[] Urgent Care (no appt availability in office) / [x] Appointment(In office/virtual)/ []  [place] [facility]/ [] Home Care/ [] Refused Recommended Disposition /[] [facility]/ []  Follow-up with PCP Additional Notes: AP contacted via phone regarding concerns of vomiting, congestion, and weakness. Spoke with wife for telephone triage. Discussed symptoms, severity, and duration. The  patient's wife reports him being able to keep fluids down with some foods. Based on assessment, patient was advised to see PCP in office. Patient's wife verbalized understanding and agreement with plan. Documentation provided.     Reason for Disposition  [1] MILD or MODERATE vomiting AND [2] present > 48 hours (2 days) (Exception: Mild vomiting with associated diarrhea.)  Answer Assessment - Initial Assessment Questions 1. VOMITING SEVERITY: "How many times have you vomited in the past 24 hours?"     - MILD:  1 - 2 times/day    - MODERATE: 3 - 5 times/day, decreased oral intake without significant weight loss or symptoms of dehydration    - SEVERE: 6 or more times/day, vomits everything or nearly everything, with significant weight loss, symptoms of dehydration      2, Clear with Phlegm  2. ONSET: "When did the vomiting begin?"      This morning  3. FLUIDS: "What fluids or food have you vomited up today?" "Have you been able to keep any fluids down?"      Yes  4. ABDOMEN PAIN:  "Are your having any abdomen pain?" If Yes : "How bad is it and what does it feel like?" (e.g., crampy, dull, intermittent, constant)      No  5. DIARRHEA: "Is there any diarrhea?" If Yes, ask: "How many times today?"      No  6. CONTACTS: "Is there anyone else in the family with the same symptoms?"      Yes, the week before last  7. CAUSE: "What do you think is causing your vomiting?"     Unsure  8. HYDRATION STATUS: "Any signs of dehydration?" (e.g., dry mouth [not only dry lips], too weak to stand) "When did you last urinate?"     Weakness with standing  9. OTHER SYMPTOMS: "Do you have any other symptoms?" (e.g., fever, headache, vertigo, vomiting blood or coffee grounds, recent head injury)     Lethargic, Weakness,  Protocols used: Vomiting-A-AH

## 2023-06-24 ENCOUNTER — Ambulatory Visit (INDEPENDENT_AMBULATORY_CARE_PROVIDER_SITE_OTHER): Payer: Medicare Other | Admitting: Nurse Practitioner

## 2023-06-24 ENCOUNTER — Encounter: Payer: Self-pay | Admitting: Nurse Practitioner

## 2023-06-24 VITALS — BP 124/58 | HR 60 | Temp 97.7°F | Ht 76.0 in | Wt 199.2 lb

## 2023-06-24 DIAGNOSIS — R6889 Other general symptoms and signs: Secondary | ICD-10-CM | POA: Diagnosis not present

## 2023-06-24 DIAGNOSIS — R112 Nausea with vomiting, unspecified: Secondary | ICD-10-CM | POA: Insufficient documentation

## 2023-06-24 DIAGNOSIS — Z1152 Encounter for screening for COVID-19: Secondary | ICD-10-CM | POA: Diagnosis not present

## 2023-06-24 LAB — POCT INFLUENZA A/B
Influenza A, POC: NEGATIVE
Influenza B, POC: NEGATIVE

## 2023-06-24 LAB — POC COVID19 BINAXNOW: SARS Coronavirus 2 Ag: NEGATIVE

## 2023-06-24 MED ORDER — ONDANSETRON 4 MG PO TBDP
4.0000 mg | ORAL_TABLET | Freq: Three times a day (TID) | ORAL | 0 refills | Status: AC | PRN
Start: 2023-06-24 — End: ?

## 2023-06-24 NOTE — Progress Notes (Signed)
 Keith Dicker, NP-C Phone: [phone]  Keith Cruz is a 83 y.o. male who presents today for vomiting.   Discussed the use of AI scribe software for clinical note transcription with the patient, who gave verbal consent to proceed.  History of Present Illness   Keith Cruz is an 83 year old male who presents with vomiting and nausea.  He has been experiencing vomiting and nausea since yesterday morning, with two episodes of vomiting yesterday and one today. The vomitus is primarily mucus and water, and nausea precedes the vomiting. No abdominal pain, diarrhea, or fever, but he reports chills without body aches. Alka-Seltzer provided temporary relief. He has been able to keep fluids down since this morning and has been drinking water. No issues with urination, recent antibiotic use, or exposure to sick contacts. He has not consumed any unusual foods recently.  He has a history of a productive cough, which was absent for a few days but has returned. The cough is different from usual and is currently productive with mucus. No shortness of breath or chest pain.  He takes Mucinex and Theraflu at night, which helps him sleep.      Social History   Tobacco Use  Smoking Status Former   Current packs/day: 0.00   Average packs/day: 2.0 packs/day for 10.0 years (20.0 ttl pk-yrs)   Types: Cigarettes   Start date: 04/28/1960   Quit date: 04/28/1970   Years since quitting: 53.1  Smokeless Tobacco Former    Current Outpatient Medications on File Prior to Visit  Medication Sig Dispense Refill   acetaminophen (TYLENOL) 500 MG tablet Take 500 mg by mouth every 6 (six) hours as needed.     albuterol (PROAIR HFA) 108 (90 Base) MCG/ACT inhaler INHALE 1-2 PUFFS INTO THE LUNGS EVERY 4 (FOUR) HOURS AS NEEDED FOR WHEEZING OR SHORTNESS OF BREATH. 18 each 12   apixaban (ELIQUIS) 5 MG TABS tablet Take 1 tablet (5 mg total) by mouth 2 (two) times daily. 60 tablet 5   ARNUITY ELLIPTA 100 MCG/ACT AEPB Inhale 1  puff into the lungs daily.     aspirin EC 81 MG tablet Take 81 mg by mouth daily.     atorvastatin (LIPITOR) 80 MG tablet Take 1 tablet (80 mg total) by mouth daily. 90 tablet 3   blood glucose meter kit and supplies Dispense based on patient and insurance preference. Use as directed to check blood glucose once daily. 1 each 0   Cholecalciferol (VITAMIN D3) 2000 UNITS capsule Take 2,000 Units by mouth daily. am     fexofenadine (ALLEGRA) 180 MG tablet Take 1 tablet (180 mg total) by mouth daily as needed for allergies or rhinitis. 90 tablet 3   fluticasone (FLONASE) 50 MCG/ACT nasal spray SPRAY 1 SPRAY INTO BOTH NOSTRILS DAILY. 16 mL 2   folic acid (FOLVITE) 1 MG tablet Take 1 mg by mouth daily. am     glucose blood test strip Test blood glucose once daily 100 each 12   hydrALAZINE (APRESOLINE) 50 MG tablet Take 1 tablet (50 mg total) by mouth 3 (three) times daily. 270 tablet 3   metoprolol tartrate (LOPRESSOR) 50 MG tablet Take 1 tablet (50 mg total) by mouth 2 (two) times daily. D/c coreg pt prefers metoprolol 180 tablet 3   Multiple Vitamins-Minerals (CENTRUM SILVER 50+MEN PO) Take by mouth.     tamsulosin (FLOMAX) 0.4 MG CAPS capsule Take 0.4 mg by mouth daily.     traZODone (DESYREL) 50 MG tablet Take  50 mg by mouth at bedtime as needed.     triamcinolone cream (KENALOG) 0.1 % APPLY TO AFFECTED AREA TWICE A DAY 30 g 5   Current Facility-Administered Medications on File Prior to Visit  Medication Dose Route Frequency Provider Last Rate Last Admin   ipratropium-albuterol (DUONEB) 0.5-2.5 (3) MG/3ML nebulizer solution 3 mL  3 mL Nebulization Once Smitty Cords, DO        ROS see history of present illness  Objective  Physical Exam Vitals:   06/24/23 0918  BP: (!) 124/58  Pulse: 60  Temp: 97.7 F (36.5 C)  SpO2: 96%    BP Readings from Last 3 Encounters:  06/24/23 (!) 124/58  05/15/23 (!) 118/58  04/24/23 120/73   Wt Readings from Last 3 Encounters:  06/24/23 199  lb 3.2 oz (90.4 kg)  05/15/23 196 lb 6.4 oz (89.1 kg)  04/24/23 195 lb 4.8 oz (88.6 kg)    Physical Exam Vitals reviewed.  Constitutional:      General: He is not in acute distress.    Appearance: Normal appearance.  HENT:     Head: Normocephalic.     Right Ear: Tympanic membrane normal.     Left Ear: Tympanic membrane normal.     Nose: Nose normal.     Mouth/Throat:     Mouth: Mucous membranes are moist.     Pharynx: Oropharynx is clear.  Eyes:     Conjunctiva/sclera: Conjunctivae normal.     Pupils: Pupils are equal, round, and reactive to light.  Cardiovascular:     Rate and Rhythm: Normal rate and regular rhythm.     Heart sounds: Normal heart sounds.  Pulmonary:     Effort: Pulmonary effort is normal.     Breath sounds: Normal breath sounds.  Abdominal:     General: Abdomen is flat. Bowel sounds are normal.     Palpations: Abdomen is soft. There is no mass.     Tenderness: There is no abdominal tenderness.  Lymphadenopathy:     Cervical: No cervical adenopathy.  Skin:    General: Skin is warm and dry.  Neurological:     General: No focal deficit present.     Mental Status: He is alert.  Psychiatric:        Mood and Affect: Mood normal.        Behavior: Behavior normal.    Assessment/Plan: Please see individual problem list.  Nausea and vomiting, unspecified vomiting type Assessment & Plan: Vomiting has persisted for 24 hours, mainly in the morning, without diarrhea or abdominal pain. There is no history of recent antibiotics, sick contacts, or unusual food intake, and no fever or body aches are present. COVID and Flu testing was negative in office. Postnasal drainage is suspected to contribute to nausea and vomiting. Prescribe Zofran 4mg  every 8 hours as needed. Advise continued hydration and a bland diet. Continue Mucinex and Allegra for postnasal drainage. Strict return precautions given to patient.   Orders: -     Ondansetron; Take 1 tablet (4 mg total) by  mouth every 8 (eight) hours as needed for nausea or vomiting.  Dispense: 20 tablet; Refill: 0  Flu-like symptoms -     POCT Influenza A/B  Encounter for screening for COVID-19 -     POC COVID-19 BinaxNow   Return if symptoms worsen or fail to improve.   Keith Dicker, NP-C [facility]

## 2023-06-24 NOTE — Assessment & Plan Note (Signed)
 Vomiting has persisted for 24 hours, mainly in the morning, without diarrhea or abdominal pain. There is no history of recent antibiotics, sick contacts, or unusual food intake, and no fever or body aches are present. COVID and Flu testing was negative in office. Postnasal drainage is suspected to contribute to nausea and vomiting. Prescribe Zofran 4mg  every 8 hours as needed. Advise continued hydration and a bland diet. Continue Mucinex and Allegra for postnasal drainage. Strict return precautions given to patient.

## 2023-07-10 DIAGNOSIS — Z951 Presence of aortocoronary bypass graft: Secondary | ICD-10-CM | POA: Diagnosis not present

## 2023-07-10 DIAGNOSIS — R42 Dizziness and giddiness: Secondary | ICD-10-CM | POA: Diagnosis not present

## 2023-07-10 DIAGNOSIS — Z9889 Other specified postprocedural states: Secondary | ICD-10-CM | POA: Diagnosis not present

## 2023-07-10 DIAGNOSIS — N1831 Chronic kidney disease, stage 3a: Secondary | ICD-10-CM | POA: Diagnosis not present

## 2023-07-10 DIAGNOSIS — I10 Essential (primary) hypertension: Secondary | ICD-10-CM | POA: Diagnosis not present

## 2023-07-10 DIAGNOSIS — I2510 Atherosclerotic heart disease of native coronary artery without angina pectoris: Secondary | ICD-10-CM | POA: Diagnosis not present

## 2023-07-10 DIAGNOSIS — E785 Hyperlipidemia, unspecified: Secondary | ICD-10-CM | POA: Diagnosis not present

## 2023-07-10 DIAGNOSIS — J439 Emphysema, unspecified: Secondary | ICD-10-CM | POA: Diagnosis not present

## 2023-07-10 DIAGNOSIS — I6523 Occlusion and stenosis of bilateral carotid arteries: Secondary | ICD-10-CM | POA: Diagnosis not present

## 2023-07-10 DIAGNOSIS — Z8679 Personal history of other diseases of the circulatory system: Secondary | ICD-10-CM | POA: Diagnosis not present

## 2023-07-10 DIAGNOSIS — Z01818 Encounter for other preprocedural examination: Secondary | ICD-10-CM | POA: Diagnosis not present

## 2023-07-10 DIAGNOSIS — R270 Ataxia, unspecified: Secondary | ICD-10-CM | POA: Diagnosis not present

## 2023-09-12 ENCOUNTER — Other Ambulatory Visit (INDEPENDENT_AMBULATORY_CARE_PROVIDER_SITE_OTHER): Payer: Self-pay | Admitting: Vascular Surgery

## 2023-09-15 ENCOUNTER — Encounter (INDEPENDENT_AMBULATORY_CARE_PROVIDER_SITE_OTHER): Payer: Self-pay

## 2023-09-16 DIAGNOSIS — R053 Chronic cough: Secondary | ICD-10-CM | POA: Diagnosis not present

## 2023-09-16 DIAGNOSIS — J439 Emphysema, unspecified: Secondary | ICD-10-CM | POA: Diagnosis not present

## 2023-09-16 DIAGNOSIS — J301 Allergic rhinitis due to pollen: Secondary | ICD-10-CM | POA: Diagnosis not present

## 2023-09-16 DIAGNOSIS — R9389 Abnormal findings on diagnostic imaging of other specified body structures: Secondary | ICD-10-CM | POA: Diagnosis not present

## 2023-10-03 ENCOUNTER — Other Ambulatory Visit: Payer: Self-pay | Admitting: Nurse Practitioner

## 2023-10-06 ENCOUNTER — Ambulatory Visit (INDEPENDENT_AMBULATORY_CARE_PROVIDER_SITE_OTHER): Payer: Medicare Other

## 2023-10-06 ENCOUNTER — Ambulatory Visit (INDEPENDENT_AMBULATORY_CARE_PROVIDER_SITE_OTHER): Payer: Medicare Other | Admitting: Vascular Surgery

## 2023-10-06 ENCOUNTER — Encounter (INDEPENDENT_AMBULATORY_CARE_PROVIDER_SITE_OTHER): Payer: Self-pay | Admitting: Vascular Surgery

## 2023-10-06 VITALS — BP 139/67 | HR 59 | Resp 18 | Ht 75.0 in | Wt 209.0 lb

## 2023-10-06 DIAGNOSIS — E785 Hyperlipidemia, unspecified: Secondary | ICD-10-CM

## 2023-10-06 DIAGNOSIS — I6523 Occlusion and stenosis of bilateral carotid arteries: Secondary | ICD-10-CM

## 2023-10-06 DIAGNOSIS — I10 Essential (primary) hypertension: Secondary | ICD-10-CM | POA: Diagnosis not present

## 2023-10-06 DIAGNOSIS — N1831 Chronic kidney disease, stage 3a: Secondary | ICD-10-CM

## 2023-10-06 DIAGNOSIS — I701 Atherosclerosis of renal artery: Secondary | ICD-10-CM

## 2023-10-06 MED ORDER — APIXABAN 2.5 MG PO TABS
2.5000 mg | ORAL_TABLET | Freq: Two times a day (BID) | ORAL | 1 refills | Status: DC
Start: 2023-10-06 — End: 2023-12-07

## 2023-10-06 NOTE — Assessment & Plan Note (Signed)
 His carotid duplex today shows a widely patent right carotid endarterectomy and a widely patent left carotid stent.  I am okay with reducing his Eliquis  to 2.5 mg twice daily.  He will continue his aspirin  and statin agent.  Follow-up in 1 year.

## 2023-10-06 NOTE — Assessment & Plan Note (Signed)
 Duplex today shows a widely patent right renal artery stent and no hemodynamically significant stenosis in the left renal artery.  Reducing Eliquis  dose to 2.5 mg twice daily.  Continue aspirin  and statin agent.  Recheck in 1 year.

## 2023-10-06 NOTE — Progress Notes (Signed)
 MRN : [id number]  Keith Cruz is a 83 y.o. (02-Feb-1941) male who presents with chief complaint of  Chief Complaint  Patient presents with   Follow-up    Follow up 1 year renal + carotid   .  History of Present Illness: Patient returns today in follow up of multiple vascular issues.  He has become more feeble and he is having more bruising.  He has been on full dose Eliquis  and his wife asks if we can reduce that.   He has a long history of carotid artery disease.  I performed a left carotid stent back in 2022.  He had a redo right carotid endarterectomy in 2021.  He had remote histories of bilateral endarterectomies a couple of decades ago.  His carotid duplex today shows a widely patent right carotid endarterectomy and a widely patent left carotid stent. He has also been for renal artery stenosis in the past.  He has a previous history of right renal artery stenting about 4 years ago.  This has improved his blood pressure control.  Duplex today shows a widely patent right renal artery stent and no hemodynamically significant stenosis in the left renal artery.  Current Outpatient Medications  Medication Sig Dispense Refill   acetaminophen  (TYLENOL ) 500 MG tablet Take 500 mg by mouth every 6 (six) hours as needed.     albuterol  (PROAIR  HFA) 108 (90 Base) MCG/ACT inhaler INHALE 1-2 PUFFS INTO THE LUNGS EVERY 4 (FOUR) HOURS AS NEEDED FOR WHEEZING OR SHORTNESS OF BREATH. 18 each 12   apixaban  (ELIQUIS ) 2.5 MG TABS tablet Take 1 tablet (2.5 mg total) by mouth 2 (two) times daily. 60 tablet 1   ARNUITY ELLIPTA  100 MCG/ACT AEPB Inhale 1 puff into the lungs daily.     aspirin  EC 81 MG tablet Take 81 mg by mouth daily.     atorvastatin  (LIPITOR ) 80 MG tablet Take 1 tablet (80 mg total) by mouth daily. 90 tablet 3   blood glucose meter kit and supplies Dispense based on patient and insurance preference. Use as directed to check blood glucose once daily. 1 each 0   Cholecalciferol  (VITAMIN D3) 2000  UNITS capsule Take 2,000 Units by mouth daily. am     fexofenadine  (ALLEGRA ) 180 MG tablet Take 1 tablet (180 mg total) by mouth daily as needed for allergies or rhinitis. 90 tablet 3   fluticasone  (FLONASE ) 50 MCG/ACT nasal spray INSTILL 1 SPRAY INTO BOTH NOSTRILS DAILY 16 mL 2   folic acid  (FOLVITE ) 1 MG tablet Take 1 mg by mouth daily. am     glucose blood test strip Test blood glucose once daily 100 each 12   hydrALAZINE  (APRESOLINE ) 50 MG tablet Take 1 tablet (50 mg total) by mouth 3 (three) times daily. 270 tablet 3   metoprolol  tartrate (LOPRESSOR ) 50 MG tablet Take 1 tablet (50 mg total) by mouth 2 (two) times daily. D/c coreg  pt prefers metoprolol  180 tablet 3   Multiple Vitamins-Minerals (CENTRUM SILVER 50+MEN PO) Take by mouth.     ondansetron  (ZOFRAN -ODT) 4 MG disintegrating tablet Take 1 tablet (4 mg total) by mouth every 8 (eight) hours as needed for nausea or vomiting. 20 tablet 0   tamsulosin  (FLOMAX ) 0.4 MG CAPS capsule Take 0.4 mg by mouth daily.     traZODone  (DESYREL ) 50 MG tablet Take 50 mg by mouth at bedtime as needed.     triamcinolone  cream (KENALOG ) 0.1 % APPLY TO AFFECTED AREA TWICE A DAY 30 g  5   Current Facility-Administered Medications  Medication Dose Route Frequency Provider Last Rate Last Admin   ipratropium-albuterol  (DUONEB) 0.5-2.5 (3) MG/3ML nebulizer solution 3 mL  3 mL Nebulization Once Raina Bunting, DO        Past Medical History:  Diagnosis Date   Arthritis    back   Asthma    Atherosclerosis of aorta (HCC)    Atherosclerosis of aorta (HCC)    Bronchitis 05/2017   chronic   CAD (coronary artery disease)    Carotid artery disease (HCC)    Carotid stenosis    Carotid stenosis, bilateral    Chronic bronchitis (HCC)    Chronic cough    COPD (chronic obstructive pulmonary disease) (HCC)    Dysrhythmia    GERD (gastroesophageal reflux disease)    Heart murmur    HOH (hard of hearing)    hearing aids   Hypercholesteremia     Hypertension    Kidney disease    stent   Macular degeneration of left eye    Myocardial infarction (HCC) 2016   Pre-diabetes    Seasonal allergies    Seizure (HCC)    Seizures (HCC)    viral meningitis   Stroke [facility])    1991 and imaging 05/12/15 old strokes   TIA (transient ischemic attack) 1991   x2   Uses hearing aid    Viral meningitis 2017   Viral meningitis    Wears dentures    upper and lower    Past Surgical History:  Procedure Laterality Date   CAROTID ENDARTERECTOMY Left 2014   CAROTID ENDARTERECTOMY Right 1991   CAROTID PTA/STENT INTERVENTION Left 05/31/2020   Procedure: CAROTID PTA/STENT INTERVENTIO;  Surgeon: Celso College, MD;  Location: [facility] INVASIVE CV LAB;  Service: Cardiovascular;  Laterality: Left;   CATARACT EXTRACTION W/PHACO Left 06/24/2017   Procedure: CATARACT EXTRACTION PHACO AND INTRAOCULAR LENS PLACEMENT (IOC) LEFT BORDERLINE DIABETIC;  Surgeon: Annell Kidney, MD;  Location: [facility];  Service: Ophthalmology;  Laterality: Left;   CATARACT EXTRACTION W/PHACO Right 07/22/2017   Procedure: CATARACT EXTRACTION PHACO AND INTRAOCULAR LENS PLACEMENT (IOC) RIGHT BORDERLINE DIABETIC;  Surgeon: Annell Kidney, MD;  Location: [facility];  Service: Ophthalmology;  Laterality: Right;   CORONARY ARTERY BYPASS GRAFT     x5   SKIN LESION EXCISION Left    left ear   URETERAL STENT PLACEMENT       Social History   Tobacco Use   Smoking status: Former    Current packs/day: 0.00    Average packs/day: 2.0 packs/day for 10.0 years (20.0 ttl pk-yrs)    Types: Cigarettes    Start date: 04/28/1960    Quit date: 04/28/1970    Years since quitting: 53.4   Smokeless tobacco: Former  Building services engineer status: Never Used  Substance Use Topics   Alcohol use: No    Alcohol/week: 0.0 standard drinks of alcohol    Comment: previous   Drug use: No      Family History  Problem Relation Age of Onset   Stroke Mother    Heart attack Mother     Dementia Mother    Stroke Father    Stroke Sister    Alzheimer's disease Sister    Dementia Sister    Dementia Sister    Stroke Brother    Alzheimer's disease Maternal Grandmother    Dementia Maternal Grandmother    Stroke Paternal Grandmother    Stroke Paternal Grandfather  Alzheimer's disease Paternal Grandfather    Cancer Neg Hx      Allergies  Allergen Reactions   Fexofenadine -Pseudoephed Er Swelling    Tongue and face. Only with D product. Tolerates plain allegra  at home.  .    Nifedipine Swelling    Tongue and face     Pseudoephedrine Swelling   Anoro Ellipta  [Umeclidinium-Vilanterol]    Carvedilol  Other (See Comments)    Light headed, heart racing, increase in blood pressure    Doxycycline  Hyclate Other (See Comments)   Prednisone Other (See Comments)    Oral prednisone. Confusion. Mental status changes   Roflumilast Other (See Comments)      REVIEW OF SYSTEMS (Negative unless checked)   Constitutional: [x] Weight loss  [] Fever  [] Chills Cardiac: [] Chest pain   [] Chest pressure   [] Palpitations   [] Shortness of breath when laying flat   [] Shortness of breath at rest   [] Shortness of breath with exertion. Vascular:  [] Pain in legs with walking   [] Pain in legs at rest   [] Pain in legs when laying flat   [] Claudication   [] Pain in feet when walking  [] Pain in feet at rest  [] Pain in feet when laying flat   [] History of DVT   [] Phlebitis   [x] Swelling in legs   [] Varicose veins   [] Non-healing ulcers Pulmonary:   [] Uses home oxygen   [] Productive cough   [] Hemoptysis   [] Wheeze  [] COPD   [] Asthma Neurologic:  [] Dizziness  [] Blackouts   [] Seizures   [x] History of stroke   [] History of TIA  [] Aphasia   [] Temporary blindness   [] Dysphagia   [] Weakness or numbness in arms   [] Weakness or numbness in legs Musculoskeletal:  [x] Arthritis   [] Joint swelling   [x] Joint pain   [] Low back pain Hematologic:  [] Easy bruising  [] Easy bleeding   [] Hypercoagulable state   [] Anemic    Gastrointestinal:  [] Blood in stool   [] Vomiting blood  [x] Gastroesophageal reflux/heartburn   [] Abdominal pain Genitourinary:  [x] Chronic kidney disease   [] Difficult urination  [] Frequent urination  [] Burning with urination   [] Hematuria Skin:  [] Rashes   [] Ulcers   [] Wounds Psychological:  [] History of anxiety   []  History of major depression.  Physical Examination  BP 139/67 (BP Location: Right Arm, Patient Position: Sitting, Cuff Size: Normal)   Pulse (!) 59   Resp 18   Ht 6\' 3"  (1.905 m)   Wt 209 lb (94.8 kg)   BMI 26.12 kg/m  Gen:  WD/WN, NAD Head: [place]/AT, No temporalis wasting. Ear/Nose/Throat: Hearing grossly intact, nares w/o erythema or drainage Eyes: Conjunctiva clear. Sclera non-icteric Neck: Supple.  Trachea midline Pulmonary:  Good air movement, no use of accessory muscles.  Cardiac: irregular Vascular:  Vessel Right Left  Radial Palpable Palpable       Musculoskeletal: M/S 5/5 throughout.  No deformity or atrophy. No edema. Neurologic: Sensation grossly intact in extremities.  Symmetrical.  Speech is fluent.  Psychiatric: Judgment intact, Mood & affect appropriate for pt's clinical situation. Dermatologic: No rashes or ulcers noted.  No cellulitis or open wounds.      Labs No results found for this or any previous visit (from the past 2160 hours).  Radiology No results found.  Assessment/Plan  Bilateral carotid artery stenosis  His carotid duplex today shows a widely patent right carotid endarterectomy and a widely patent left carotid stent.  I am okay with reducing his Eliquis  to 2.5 mg twice daily.  He will continue his aspirin  and statin agent.  Follow-up in 1 year.  Renal artery stenosis (HCC) Duplex today shows a widely patent right renal artery stent and no hemodynamically significant stenosis in the left renal artery.  Reducing Eliquis  dose to 2.5 mg twice daily.  Continue aspirin  and statin agent.  Recheck in 1 year.  Benign essential  HTN blood pressure control important in reducing the progression of atherosclerotic disease. On appropriate oral medications.   Chronic kidney disease, stage III (moderate) Avoid contrast unless absolutely necessary.  Especially now with myeloma diagnosis.   Hyperlipidemia lipid control important in reducing the progression of atherosclerotic disease. Continue statin therapy  Mikki Alexander, MD  10/06/2023 4:20 PM    This note was created with Dragon medical transcription system.  Any errors from dictation are purely unintentional

## 2023-10-16 ENCOUNTER — Other Ambulatory Visit: Payer: Self-pay | Admitting: Nurse Practitioner

## 2023-10-16 DIAGNOSIS — R7303 Prediabetes: Secondary | ICD-10-CM

## 2023-10-16 DIAGNOSIS — I10 Essential (primary) hypertension: Secondary | ICD-10-CM

## 2023-10-20 ENCOUNTER — Inpatient Hospital Stay: Payer: Medicare Other | Attending: Internal Medicine

## 2023-10-20 DIAGNOSIS — D469 Myelodysplastic syndrome, unspecified: Secondary | ICD-10-CM | POA: Insufficient documentation

## 2023-10-20 DIAGNOSIS — D46Z Other myelodysplastic syndromes: Secondary | ICD-10-CM

## 2023-10-20 LAB — CBC WITH DIFFERENTIAL (CANCER CENTER ONLY)
Abs Immature Granulocytes: 0.03 10*3/uL (ref 0.00–0.07)
Basophils Absolute: 0.1 10*3/uL (ref 0.0–0.1)
Basophils Relative: 1 %
Eosinophils Absolute: 0.1 10*3/uL (ref 0.0–0.5)
Eosinophils Relative: 1 %
HCT: 34.3 % — ABNORMAL LOW (ref 39.0–52.0)
Hemoglobin: 11.1 g/dL — ABNORMAL LOW (ref 13.0–17.0)
Immature Granulocytes: 0 %
Lymphocytes Relative: 15 %
Lymphs Abs: 1.1 10*3/uL (ref 0.7–4.0)
MCH: 35.4 pg — ABNORMAL HIGH (ref 26.0–34.0)
MCHC: 32.4 g/dL (ref 30.0–36.0)
MCV: 109.2 fL — ABNORMAL HIGH (ref 80.0–100.0)
Monocytes Absolute: 0.6 10*3/uL (ref 0.1–1.0)
Monocytes Relative: 8 %
Neutro Abs: 5.4 10*3/uL (ref 1.7–7.7)
Neutrophils Relative %: 75 %
Platelet Count: 165 10*3/uL (ref 150–400)
RBC: 3.14 MIL/uL — ABNORMAL LOW (ref 4.22–5.81)
RDW: 15.6 % — ABNORMAL HIGH (ref 11.5–15.5)
WBC Count: 7.2 10*3/uL (ref 4.0–10.5)
nRBC: 0 % (ref 0.0–0.2)

## 2023-10-20 LAB — CMP (CANCER CENTER ONLY)
ALT: 26 U/L (ref 0–44)
AST: 28 U/L (ref 15–41)
Albumin: 3.7 g/dL (ref 3.5–5.0)
Alkaline Phosphatase: 53 U/L (ref 38–126)
Anion gap: 8 (ref 5–15)
BUN: 21 mg/dL (ref 8–23)
CO2: 25 mmol/L (ref 22–32)
Calcium: 8.8 mg/dL — ABNORMAL LOW (ref 8.9–10.3)
Chloride: 104 mmol/L (ref 98–111)
Creatinine: 1.44 mg/dL — ABNORMAL HIGH (ref 0.61–1.24)
GFR, Estimated: 48 mL/min — ABNORMAL LOW (ref >60)
Glucose, Bld: 117 mg/dL — ABNORMAL HIGH (ref 70–99)
Potassium: 4.3 mmol/L (ref 3.5–5.1)
Sodium: 137 mmol/L (ref 135–145)
Total Bilirubin: 0.8 mg/dL (ref 0.0–1.2)
Total Protein: 7.2 g/dL (ref 6.5–8.1)

## 2023-10-21 ENCOUNTER — Ambulatory Visit
Admission: EM | Admit: 2023-10-21 | Discharge: 2023-10-21 | Disposition: A | Discharge status: Home / Self Care | Attending: Emergency Medicine | Admitting: Emergency Medicine

## 2023-10-21 ENCOUNTER — Encounter
Admission: EM | Disposition: A | Discharge status: Home / Self Care | Payer: Self-pay | Source: Home / Self Care | Attending: Emergency Medicine

## 2023-10-21 ENCOUNTER — Other Ambulatory Visit: Payer: Self-pay

## 2023-10-21 ENCOUNTER — Emergency Department: Admitting: Anesthesiology

## 2023-10-21 DIAGNOSIS — I480 Paroxysmal atrial fibrillation: Secondary | ICD-10-CM | POA: Diagnosis not present

## 2023-10-21 DIAGNOSIS — W44F3XA Food entering into or through a natural orifice, initial encounter: Secondary | ICD-10-CM | POA: Insufficient documentation

## 2023-10-21 DIAGNOSIS — R1310 Dysphagia, unspecified: Secondary | ICD-10-CM

## 2023-10-21 DIAGNOSIS — T18128A Food in esophagus causing other injury, initial encounter: Secondary | ICD-10-CM | POA: Insufficient documentation

## 2023-10-21 DIAGNOSIS — K222 Esophageal obstruction: Secondary | ICD-10-CM | POA: Insufficient documentation

## 2023-10-21 DIAGNOSIS — T182XXA Foreign body in stomach, initial encounter: Secondary | ICD-10-CM | POA: Diagnosis not present

## 2023-10-21 HISTORY — PX: ESOPHAGOGASTRODUODENOSCOPY: SHX5428

## 2023-10-21 LAB — KAPPA/LAMBDA LIGHT CHAINS
Kappa free light chain: 46.5 mg/L — ABNORMAL HIGH (ref 3.3–19.4)
Kappa, lambda light chain ratio: 2.6 — ABNORMAL HIGH (ref 0.26–1.65)
Lambda free light chains: 17.9 mg/L (ref 5.7–26.3)

## 2023-10-21 SURGERY — EGD (ESOPHAGOGASTRODUODENOSCOPY)
Anesthesia: General

## 2023-10-21 MED ORDER — GLUCAGON HCL (RDNA) 1 MG IJ SOLR
1.0000 mg | Freq: Once | INTRAMUSCULAR | Status: AC
  Administered 2023-10-21: 1 mg via INTRAVENOUS
  Filled 2023-10-21: qty 1

## 2023-10-21 MED ORDER — ONDANSETRON HCL 4 MG/2ML IJ SOLN
INTRAMUSCULAR | Status: DC | PRN
  Administered 2023-10-21: 4 mg via INTRAVENOUS

## 2023-10-21 MED ORDER — DEXAMETHASONE SODIUM PHOSPHATE 10 MG/ML IJ SOLN
INTRAMUSCULAR | Status: AC
Start: 2023-10-21 — End: 2023-10-21
  Filled 2023-10-21: qty 1

## 2023-10-21 MED ORDER — PROPOFOL 10 MG/ML IV BOLUS
INTRAVENOUS | Status: AC
  Filled 2023-10-21: qty 20

## 2023-10-21 MED ORDER — ONDANSETRON HCL 4 MG/2ML IJ SOLN
4.0000 mg | Freq: Once | INTRAMUSCULAR | Status: AC
  Administered 2023-10-21: 4 mg via INTRAVENOUS
  Filled 2023-10-21: qty 2

## 2023-10-21 MED ORDER — LIDOCAINE HCL (CARDIAC) PF 100 MG/5ML IV SOSY
PREFILLED_SYRINGE | INTRAVENOUS | Status: DC | PRN
  Administered 2023-10-21: 80 mg via INTRAVENOUS

## 2023-10-21 MED ORDER — SODIUM CHLORIDE 0.9 % IV SOLN: INTRAVENOUS | Status: DC

## 2023-10-21 MED ORDER — ONDANSETRON HCL 4 MG/2ML IJ SOLN
INTRAMUSCULAR | Status: AC
  Filled 2023-10-21: qty 2

## 2023-10-21 MED ORDER — FENTANYL CITRATE (PF) 100 MCG/2ML IJ SOLN
INTRAMUSCULAR | Status: AC
  Filled 2023-10-21: qty 2

## 2023-10-21 MED ORDER — FENTANYL CITRATE (PF) 100 MCG/2ML IJ SOLN
INTRAMUSCULAR | Status: DC | PRN
  Administered 2023-10-21: 50 ug via INTRAVENOUS

## 2023-10-21 MED ORDER — DEXAMETHASONE SODIUM PHOSPHATE 10 MG/ML IJ SOLN
INTRAMUSCULAR | Status: DC | PRN
  Administered 2023-10-21: 10 mg via INTRAVENOUS

## 2023-10-21 MED ORDER — SUCCINYLCHOLINE CHLORIDE 200 MG/10ML IV SOSY
PREFILLED_SYRINGE | INTRAVENOUS | Status: DC | PRN
  Administered 2023-10-21: 100 mg via INTRAVENOUS

## 2023-10-21 MED ORDER — PROPOFOL 10 MG/ML IV BOLUS
INTRAVENOUS | Status: DC | PRN
  Administered 2023-10-21: 120 mg via INTRAVENOUS

## 2023-10-21 NOTE — ED Notes (Signed)
 Report given to Dava, RN for EGD procedure, stated she will be down soon to retrieve patient.

## 2023-10-21 NOTE — Anesthesia Postprocedure Evaluation (Signed)
 Anesthesia Post Note  Patient: DELOYD HANDY  Procedure(s) Performed: EGD (ESOPHAGOGASTRODUODENOSCOPY)  Patient location during evaluation: PACU Anesthesia Type: General Level of consciousness: awake and alert, oriented and patient cooperative Pain management: pain level controlled Vital Signs Assessment: post-procedure vital signs reviewed and stable Respiratory status: spontaneous breathing, nonlabored ventilation and respiratory function stable Cardiovascular status: blood pressure returned to baseline and stable Postop Assessment: adequate PO intake Anesthetic complications: no   No notable events documented.   Last Vitals:  Vitals:   10/21/23 1229 10/21/23 1239  BP: 134/65 (!) 141/59  Pulse: (!) 58 (!) 58  Resp: 19 14  Temp:    SpO2: 95% 92%    Last Pain:  Vitals:   10/21/23 1239  TempSrc:   PainSc: 0-No pain                 Alfonso Ruths

## 2023-10-21 NOTE — H&P (Signed)
 [name], MD [facility] [address] [place], [place] [address] [phone] Fax : [phone]  Primary Care Physician:  Gretel App, NP Primary Gastroenterologist:  Dr. Copping  Pre-Procedure History & Physical: HPI:  Keith Cruz is a 83 y.o. male is here for an endoscopy.   Past Medical History:  Diagnosis Date   Arthritis    back   Asthma    Atherosclerosis of aorta (HCC)    Atherosclerosis of aorta (HCC)    Bronchitis 05/2017   chronic   CAD (coronary artery disease)    Carotid artery disease (HCC)    Carotid stenosis    Carotid stenosis, bilateral    Chronic bronchitis (HCC)    Chronic cough    COPD (chronic obstructive pulmonary disease) (HCC)    Dysrhythmia    GERD (gastroesophageal reflux disease)    Heart murmur    HOH (hard of hearing)    hearing aids   Hypercholesteremia    Hypertension    Kidney disease    stent   Macular degeneration of left eye    Myocardial infarction (HCC) 2016   Pre-diabetes    Seasonal allergies    Seizure (HCC)    Seizures (HCC)    viral meningitis   Stroke [facility])    1991 and imaging 05/12/15 old strokes   TIA (transient ischemic attack) 1991   x2   Uses hearing aid    Viral meningitis 2017   Viral meningitis    Wears dentures    upper and lower    Past Surgical History:  Procedure Laterality Date   CAROTID ENDARTERECTOMY Left 2014   CAROTID ENDARTERECTOMY Right 1991   CAROTID PTA/STENT INTERVENTION Left 05/31/2020   Procedure: CAROTID PTA/STENT INTERVENTIO;  Surgeon: Marea Selinda RAMAN, MD;  Location: [facility] INVASIVE CV LAB;  Service: Cardiovascular;  Laterality: Left;   CATARACT EXTRACTION W/PHACO Left 06/24/2017   Procedure: CATARACT EXTRACTION PHACO AND INTRAOCULAR LENS PLACEMENT (IOC) LEFT BORDERLINE DIABETIC;  Surgeon: Mittie Gaskin, MD;  Location: [facility];  Service: Ophthalmology;  Laterality: Left;   CATARACT EXTRACTION W/PHACO Right 07/22/2017   Procedure: CATARACT EXTRACTION PHACO AND  INTRAOCULAR LENS PLACEMENT (IOC) RIGHT BORDERLINE DIABETIC;  Surgeon: Mittie Gaskin, MD;  Location: [facility];  Service: Ophthalmology;  Laterality: Right;   CORONARY ARTERY BYPASS GRAFT     x5   SKIN LESION EXCISION Left    left ear   URETERAL STENT PLACEMENT      Prior to Admission medications   Medication Sig Start Date End Date Taking? Authorizing Provider  apixaban  (ELIQUIS ) 2.5 MG TABS tablet Take 1 tablet (2.5 mg total) by mouth 2 (two) times daily. 10/06/23  Yes Dew, Selinda RAMAN, MD  ARNUITY ELLIPTA  100 MCG/ACT AEPB Inhale 1 puff into the lungs daily. 10/08/22  Yes [provider]  aspirin  EC 81 MG tablet Take 81 mg by mouth daily.   Yes [provider]  atorvastatin  (LIPITOR ) 80 MG tablet TAKE 1 TABLET BY MOUTH EVERY DAY 10/20/23  Yes Gretel App, NP  Cholecalciferol  (VITAMIN D3) 2000 UNITS capsule Take 2,000 Units by mouth daily. am   Yes [provider]  folic acid  (FOLVITE ) 1 MG tablet Take 1 mg by mouth daily. am   Yes [provider]  hydrALAZINE  (APRESOLINE ) 50 MG tablet TAKE 1 TABLET BY MOUTH THREE TIMES A DAY 10/20/23  Yes Gretel App, NP  metoprolol  tartrate (LOPRESSOR ) 50 MG tablet TAKE 1 TABLET (50 MG TOTAL) BY MOUTH 2 (TWO) TIMES  DAILY. D/C COREG  PT PREFERS METOPROLOL  10/20/23  Yes Gretel App, NP  Multiple Vitamins-Minerals (CENTRUM SILVER 50+MEN PO) Take by mouth.   Yes [provider]  tamsulosin  (FLOMAX ) 0.4 MG CAPS capsule Take 0.4 mg by mouth daily. 12/17/20  Yes [provider]  traZODone  (DESYREL ) 50 MG tablet Take 50 mg by mouth at bedtime as needed.   Yes [provider]  ACCU-CHEK GUIDE TEST test strip TEST BLOOD GLUCOSE ONCE DAILY 10/20/23   Gretel App, NP  acetaminophen  (TYLENOL ) 500 MG tablet Take 500 mg by mouth every 6 (six) hours as needed.    [provider]  albuterol  (PROAIR  HFA) 108 (90 Base) MCG/ACT inhaler INHALE 1-2 PUFFS INTO THE LUNGS EVERY 4 (FOUR) HOURS AS NEEDED  FOR WHEEZING OR SHORTNESS OF BREATH. 11/22/21   McLean-Scocuzza, Randine SAILOR, MD  blood glucose meter kit and supplies Dispense based on patient and insurance preference. Use as directed to check blood glucose once daily. 10/14/22   Gretel App, NP  fexofenadine  (ALLEGRA ) 180 MG tablet Take 1 tablet (180 mg total) by mouth daily as needed for allergies or rhinitis. 11/22/21   McLean-Scocuzza, Randine SAILOR, MD  fluticasone  (FLONASE ) 50 MCG/ACT nasal spray INSTILL 1 SPRAY INTO BOTH NOSTRILS DAILY 10/05/23   Gretel App, NP  ondansetron  (ZOFRAN -ODT) 4 MG disintegrating tablet Take 1 tablet (4 mg total) by mouth every 8 (eight) hours as needed for nausea or vomiting. 06/24/23   Gretel App, NP  triamcinolone  cream (KENALOG ) 0.1 % APPLY TO AFFECTED AREA TWICE A DAY 02/13/23   Gretel App, NP    Allergies as of 10/21/2023 - Review Complete 10/21/2023  Allergen Reaction Noted   Fexofenadine -pseudoephed er Swelling 01/22/2015   Nifedipine Swelling 01/22/2015   Pseudoephedrine Swelling 02/14/2020   Anoro ellipta  [umeclidinium-vilanterol]  05/11/2018   Carvedilol  Other (See Comments) 01/30/2020   Doxycycline  hyclate Other (See Comments) 05/28/2022   Prednisone Other (See Comments) 01/22/2015   Roflumilast Other (See Comments) 03/17/2023    Family History  Problem Relation Age of Onset   Stroke Mother    Heart attack Mother    Dementia Mother    Stroke Father    Stroke Sister    Alzheimer's disease Sister    Dementia Sister    Dementia Sister    Stroke Brother    Alzheimer's disease Maternal Grandmother    Dementia Maternal Grandmother    Stroke Paternal Grandmother    Stroke Paternal Grandfather    Alzheimer's disease Paternal Grandfather    Cancer Neg Hx     Social History   Socioeconomic History   Marital status: Married    Spouse name: Not on file   Number of children: Not on file   Years of education: Not on file   Highest education level: 10th grade  Occupational History   Not on file   Tobacco Use   Smoking status: Former    Current packs/day: 0.00    Average packs/day: 2.0 packs/day for 10.0 years (20.0 ttl pk-yrs)    Types: Cigarettes    Start date: 04/28/1960    Quit date: 04/28/1970    Years since quitting: 53.5   Smokeless tobacco: Former  Building services engineer status: Never Used  Substance and Sexual Activity   Alcohol use: No    Alcohol/week: 0.0 standard drinks of alcohol    Comment: previous   Drug use: No   Sexual activity: Yes    Comment: wife   Other Topics Concern   Not on  file  Social History Narrative   Re-married x 3 years wife named Audiological scientist pets, 1 dog.    Retired    Investment banker, operational, wears seat belt, safe in relationship    2 sons and 1 daughter    6 years [employer]    Social Drivers of Corporate investment banker Strain: Low Risk  (09/16/2023)   Received from [facility]   Overall Financial Resource Strain (CARDIA)    Difficulty of Paying Living Expenses: Not hard at all  Food Insecurity: No Food Insecurity (09/16/2023)   Received from [facility]   Hunger Vital Sign    Within the past 12 months, you worried that your food would run out before you got the money to buy more.: Never true    Within the past 12 months, the food you bought just didn't last and you didn't have money to get more.: Never true  Transportation Needs: No Transportation Needs (09/16/2023)   Received from [facility] - Transportation    In the past 12 months, has lack of transportation kept you from medical appointments or from getting medications?: No    Lack of Transportation (Non-Medical): No  Physical Activity: Inactive (05/14/2023)   Exercise Vital Sign    Days of Exercise per Week: 0 days    Minutes of Exercise per Session: 0 min  Stress: Stress Concern Present (05/14/2023)   [employer] of Occupational Health - Occupational Stress Questionnaire    Feeling of Stress : Very much  Social  Connections: Moderately Integrated (05/14/2023)   Social Connection and Isolation Panel    Frequency of Communication with Friends and Family: More than three times a week    Frequency of Social Gatherings with Friends and Family: Patient declined    Attends Religious Services: Never    Database administrator or Organizations: Yes    Attends Banker Meetings: Never    Marital Status: Married  Recent Concern: Social Connections - Moderately Isolated (02/13/2023)   Social Connection and Isolation Panel    Frequency of Communication with Friends and Family: More than three times a week    Frequency of Social Gatherings with Friends and Family: Once a week    Attends Religious Services: Never    Database administrator or Organizations: No    Attends Banker Meetings: Never    Marital Status: Married  Catering manager Violence: Not At Risk (02/16/2023)   Humiliation, Afraid, Rape, and Kick questionnaire    Fear of Current or Ex-Partner: No    Emotionally Abused: No    Physically Abused: No    Sexually Abused: No    Review of Systems: See HPI, otherwise negative ROS  Physical Exam: BP (!) 166/63   Pulse (!) 58   Temp (!) 97.1 F (36.2 C) (Temporal)   Resp 16   Ht 6' 2 (1.88 m)   Wt 98.9 kg   SpO2 99%   BMI 27.99 kg/m  General:   Alert,  pleasant and cooperative in NAD Head:  Normocephalic and atraumatic. Neck:  Supple; no masses or thyromegaly. Lungs:  Clear throughout to auscultation.    Heart:  Regular rate and rhythm. Abdomen:  Soft, nontender and nondistended. Normal bowel sounds, without guarding, and without rebound.   Neurologic:  Alert and  oriented x4;  grossly normal neurologically.  Impression/Plan: Keith Cruz is here for an endoscopy to be performed  for dysphagia  Risks, benefits, limitations, and alternatives regarding  endoscopy have been reviewed with the patient.  Questions have been answered.  All parties agreeable.   [name], MD  10/21/2023, 12:13 PM

## 2023-10-21 NOTE — ED Notes (Signed)
 Patient endorses no change in feeling after glucagon administration and drinking an entire can of coke. Patient has not had vomiting/spitting up currently. Jossie, MD notified.

## 2023-10-21 NOTE — Anesthesia Procedure Notes (Signed)
 Procedure Name: Intubation Date/Time: 10/21/2023 12:02 PM  Performed by: Tod Handing, CRNAPre-anesthesia Checklist: Patient identified, Emergency Drugs available, Suction available and Patient being monitored Patient Re-evaluated:Patient Re-evaluated prior to induction Oxygen Delivery Method: Circle system utilized Preoxygenation: Pre-oxygenation with 100% oxygen Induction Type: IV induction, Cricoid Pressure applied and Rapid sequence Laryngoscope Size: Mac and 3 Grade View: Grade II Tube type: Oral Tube size: 7.0 mm Number of attempts: 1 Airway Equipment and Method: Stylet Placement Confirmation: ETT inserted through vocal cords under direct vision, positive ETCO2 and breath sounds checked- equal and bilateral Secured at: 23 cm Tube secured with: Tape Dental Injury: Teeth and Oropharynx as per pre-operative assessment

## 2023-10-21 NOTE — Op Note (Signed)
 [facility] Gastroenterology Patient Name: Keith Cruz Procedure Date: 10/21/2023 11:45 AM MRN: [id number] Account #: [id number] Date of Birth: May 29, 1940 Admit Type: Inpatient Age: 83 Room: [facility] ENDO ROOM 1 Gender: Male Note Status: Finalized Instrument Name: Donnita [id number] Procedure:             Upper GI endoscopy Indications:           Foreign body in the esophagus Providers:             [name] MD, MD Referring MD:          [name] (Referring MD) Medicines:             Propofol per Anesthesia Complications:         No immediate complications. Procedure:             Pre-Anesthesia Assessment:                        - Prior to the procedure, a History and Physical was                         performed, and patient medications and allergies were                         reviewed. The patient's tolerance of previous                         anesthesia was also reviewed. The risks and benefits                         of the procedure and the sedation options and risks                         were discussed with the patient. All questions were                         answered, and informed consent was obtained. Prior                         Anticoagulants: The patient has taken Eliquis                          (apixaban ), last dose was day of procedure. ASA Grade                         Assessment: II - A patient with mild systemic disease.                         After reviewing the risks and benefits, the patient                         was deemed in satisfactory condition to undergo the                         procedure.                        After obtaining informed consent, the endoscope was  passed under direct vision. Throughout the procedure,                         the patient's blood pressure, pulse, and oxygen                         saturations were monitored continuously. The Endoscope                         was  introduced through the mouth, and advanced to the                         second part of duodenum. The upper GI endoscopy was                         accomplished without difficulty. The patient tolerated                         the procedure well. Findings:      One benign-appearing, intrinsic severe stenosis was found at the       gastroesophageal junction. The stenosis was traversed.      Food was found at the gastroesophageal junction.      The stomach was normal.      The examined duodenum was normal. Impression:            - Benign-appearing esophageal stenosis.                        - Food at the gastroesophageal junction.                        - Normal stomach.                        - Normal examined duodenum.                        - No specimens collected. Recommendation:        - Discharge patient to home.                        - Resume previous diet.                        - Continue present medications.                        - Repeat upper endoscopy for dilation off of                         anticoagulation. Procedure Code(s):     --- Professional ---                        [phone], Esophagogastroduodenoscopy, flexible,                         transoral; diagnostic, including collection of                         specimen(s) by brushing or washing, when performed                         (  separate procedure) Diagnosis Code(s):     --- Professional ---                        U81.871J, Food in esophagus causing other injury,                         initial encounter                        K22.2, Esophageal obstruction CPT copyright 2022 American Medical Association. All rights reserved. The codes documented in this report are preliminary and upon coder review may  be revised to meet current compliance requirements. [name] MD, MD 10/21/2023 12:12:04 PM This report has been signed electronically. Number of Addenda: 0 Note Initiated On: 10/21/2023 11:45 AM Estimated  Blood Loss:  Estimated blood loss: none.      [facility]

## 2023-10-21 NOTE — Anesthesia Preprocedure Evaluation (Signed)
 Anesthesia Evaluation  Patient identified by MRN, date of birth, ID band Patient awake    Reviewed: Allergy & Precautions, NPO status , Patient's Chart, lab work & pertinent test results  History of Anesthesia Complications Negative for: history of anesthetic complications  Airway Mallampati: III  TM Distance: >3 FB Neck ROM: Full    Dental  (+) Upper Dentures, Partial Lower   Pulmonary asthma , former smoker   Pulmonary exam normal breath sounds clear to auscultation       Cardiovascular Exercise Tolerance: Good hypertension, + CAD (s/p CABG 5V 2015) and + Peripheral Vascular Disease (s/p CEA 2016, renal stent 2010)  Normal cardiovascular exam+ dysrhythmias (a fib)  Rhythm:Regular Rate:Normal  ECG 12/09/22: SR; LBBB  Echo 12/27/19:    NORMAL LEFT VENTRICULAR SYSTOLIC FUNCTION    NORMAL LA PRESSURES WITH NORMAL DIASTOLIC FUNCTION    NORMAL RIGHT VENTRICULAR SYSTOLIC FUNCTION    VALVULAR REGURGITATION: TRIVIAL PR, TRIVIAL TR    NO VALVULAR STENOSIS    SALINE MICROCAVITATIONS ARE NORMAL AT REST AND AFTER VALSALVA   Myocardial perfusion 09/28/19:  Indeterminant Lexiscan  infusion EKG due to baseline EKG changes and left  bundle branch block  Patient did have some artifact consistent with lateral wall issues  Normal myocardial perfusion without evidence of myocardial ischemia      Neuro/Psych Seizures -,  PSYCHIATRIC DISORDERS Anxiety Depression    TIA (1991; no deficits)   GI/Hepatic ,GERD  ,,  Endo/Other  Pre-diabetes  Renal/GU Renal disease (stage III CKD)     Musculoskeletal   Abdominal   Peds  Hematology negative hematology ROS (+)   Anesthesia Other Findings Cardiology note 07/10/23:  83 y.o. male with  1. Coronary artery disease involving native coronary artery of native heart without angina pectoris  2. S/P CABG x 5: LIMA to LAD, SVG to PDA, SVG to Diag, SVG to OM1, SVG to PLB  3. History of atrial  fibrillation  4. Benign essential hypertension  5. Bilateral carotid artery stenosis  6. S/P carotid endarterectomy  7. Stage 3a chronic kidney disease (CMS/HHS-HCC)  8. Pulmonary emphysema, unspecified emphysema type (CMS/HHS-HCC)  9. Hyperlipidemia with target LDL less than 70  10. Preoperative clearance  11. Vertigo  12. Ataxia  13. Former smoker   Plan  Frequent falls related to ataxia possibly related to TIA CVA CVA recommend physical therapy follow-up with neurology Hyperlipidemia hyperlipidemia continue Lipitor  therapy. Continue statin therapy consider switching to injectable if unable to tolerate statin Peripheral vascular disease follow-up with vascular for peripheral vascular disease status post carotid endarterectomy in the past Chronic renal insufficiency reasonably stable continue adequate hydration renal perfusion follow-up with nephrology Former smoker advised patient to continue to refrain from tobacco abuse Coronary bypass surgery x 5 stable denies any significant recent anginal continue Eliquis  metoprolol  Atrial fibrillation paroxysmal on Eliquis  for anticoagulation metoprolol  for rate Hypertension reasonably controlled on metoprolol  was on hydralazine  History of COPD emphysema continue inhalers as necessary follow-up with pulmonary Have the patient follow-up in 6 months  Return in about 6 months (around 01/10/2024).    Reproductive/Obstetrics                              Anesthesia Physical Anesthesia Plan  ASA: 3  Anesthesia Plan: General   Post-op Pain Management:    Induction: Intravenous and Rapid sequence  PONV Risk Score and Plan: 1 and Ondansetron , Dexamethasone and Treatment may vary due to age or medical  condition  Airway Management Planned: Oral ETT  Additional Equipment:   Intra-op Plan:   Post-operative Plan: Extubation in OR  Informed Consent: I have reviewed the patients History and Physical, chart, labs and  discussed the procedure including the risks, benefits and alternatives for the proposed anesthesia with the patient or authorized representative who has indicated his/her understanding and acceptance.     Dental advisory given  Plan Discussed with: CRNA  Anesthesia Plan Comments: (Patient consented for risks of anesthesia including but not limited to:  - adverse reactions to medications - damage to eyes, teeth, lips or other oral mucosa - nerve damage due to positioning  - sore throat or hoarseness - damage to heart, brain, nerves, lungs, other parts of body or loss of life  Informed patient about role of CRNA in peri- and intra-operative care.  Patient voiced understanding.)         Anesthesia Quick Evaluation

## 2023-10-21 NOTE — ED Triage Notes (Signed)
 Pt arrived to the ED via POV complaining of difficulty swallowing. pt denies any difficulty breathing. States that this has been going on for the past year but has worsened since yesterday. Pt is unable to swallow saliva. Had blood work done yesterday at the cancer center. A&O x 4. HOH.

## 2023-10-21 NOTE — ED Provider Notes (Signed)
 [facility] Provider Note   Event Date/Time   First MD Initiated Contact with Patient 10/21/23 1023     (approximate) History  Dysphagia  HPI Keith Cruz is a 83 y.o. male with a past medical history of dysphagia who presents complaining of worsening difficulty swallowing.  Patient states that he has been unable to swallow his saliva since last night.  Patient states that he has had similar episodes in the past however it was many many years ago and they have had to do a scope to fix things.  Patient states that the symptoms had worsening over the last year however he has not had any swallow studies or EGD over that time.  Patient states the symptoms also acutely worsened this morning when he attempted to take a handful of pills with his breakfast. ROS: Patient currently denies any vision changes, tinnitus, difficulty speaking, facial droop, sore throat, chest pain, shortness of breath, abdominal pain, nausea/vomiting/diarrhea, dysuria, or weakness/numbness/paresthesias in any extremity   Physical Exam  Triage Vital Signs: ED Triage Vitals  Encounter Vitals Group     BP 10/21/23 1011 (!) 152/62     Girls Systolic BP Percentile --      Girls Diastolic BP Percentile --      Boys Systolic BP Percentile --      Boys Diastolic BP Percentile --      Pulse Rate 10/21/23 1011 64     Resp 10/21/23 1011 17     Temp 10/21/23 1011 97.9 F (36.6 C)     Temp Source 10/21/23 1011 Oral     SpO2 10/21/23 1011 95 %     Weight 10/21/23 1013 218 lb (98.9 kg)     Height 10/21/23 1013 6' 2 (1.88 m)     Head Circumference --      Peak Flow --      Pain Score 10/21/23 1012 8     Pain Loc --      Pain Education --      Exclude from Growth Chart --    Most recent vital signs: Vitals:   10/21/23 1011  BP: (!) 152/62  Pulse: 64  Resp: 17  Temp: 97.9 F (36.6 C)  SpO2: 95%   General: Awake, oriented x4. CV:  Good peripheral perfusion. Resp:  Normal effort. Abd:  No  distention. Other:  Elderly overweight Caucasian male resting comfortably in no acute distress.  Currently spitting saliva into a basin ED Results / Procedures / Treatments  Labs (all labs ordered are listed, but only abnormal results are displayed) Labs Reviewed - No data to display  PROCEDURES: Critical Care performed: No Procedures MEDICATIONS ORDERED IN ED: Medications  glucagon (GLUCAGEN) injection 1 mg (1 mg Intravenous Given 10/21/23 1041)  ondansetron  (ZOFRAN ) injection 4 mg (4 mg Intravenous Given 10/21/23 1041)   IMPRESSION / MDM / ASSESSMENT AND PLAN / ED COURSE  I reviewed the triage vital signs and the nursing notes.                             The patient is on the cardiac monitor to evaluate for evidence of arrhythmia and/or significant heart rate changes. Patient's presentation is most consistent with acute presentation with potential threat to life or bodily function. Patient is a 83 year old male who presents for possible esophageal foreign body Endorses globus sensation Given history and exam patient's presentation most consistent with esophageal foreign body. I  have low suspicion for esophageal perforation, impending esophageal necrosis, or airway compromise. Interventions: Carbonated beverages Antiemetic followed in approximately 15 minutes by glucagon 1 mg IV Consult: Gastroenterology-I spoke to Dr. Jinny in gastroenterology who agrees with plan for EGD Disposition GI for upper endoscopy   FINAL CLINICAL IMPRESSION(S) / ED DIAGNOSES   Final diagnoses:  Dysphagia, unspecified type  Esophageal obstruction   Rx / DC Orders   ED Discharge Orders     None      Note:  This document was prepared using Dragon voice recognition software and may include unintentional dictation errors.   Zyion Doxtater K, MD 10/21/23 1125

## 2023-10-21 NOTE — Transfer of Care (Signed)
 Immediate Anesthesia Transfer of Care Note  Patient: Keith Cruz  Procedure(s) Performed: Procedure(s): EGD (ESOPHAGOGASTRODUODENOSCOPY) (N/A)  Patient Location: PACU and Endoscopy Unit  Anesthesia Type:General  Level of Consciousness: sedated  Airway & Oxygen Therapy: Patient Spontanous Breathing and Patient connected to nasal cannula oxygen  Post-op Assessment: Report given to RN and Post -op Vital signs reviewed and stable  Post vital signs: Reviewed and stable  Last Vitals:  Vitals:   10/21/23 1137 10/21/23 1219  BP: (!) 166/63 (!) 151/59  Pulse: (!) 58   Resp: 16 16  Temp: (!) 36.2 C (!) 36.2 C  SpO2: 99% 94%    Complications: No apparent anesthesia complications

## 2023-10-22 ENCOUNTER — Encounter: Payer: Self-pay | Admitting: Gastroenterology

## 2023-10-22 ENCOUNTER — Telehealth: Payer: Self-pay

## 2023-10-22 LAB — MULTIPLE MYELOMA PANEL, SERUM
Albumin SerPl Elph-Mcnc: 3.8 g/dL (ref 2.9–4.4)
Albumin/Glob SerPl: 1.3 (ref 0.7–1.7)
Alpha 1: 0.2 g/dL (ref 0.0–0.4)
Alpha2 Glob SerPl Elph-Mcnc: 0.7 g/dL (ref 0.4–1.0)
B-Globulin SerPl Elph-Mcnc: 1.3 g/dL (ref 0.7–1.3)
Gamma Glob SerPl Elph-Mcnc: 0.9 g/dL (ref 0.4–1.8)
Globulin, Total: 3.1 g/dL (ref 2.2–3.9)
IgA: 661 mg/dL — ABNORMAL HIGH (ref 61–437)
IgG (Immunoglobin G), Serum: 1056 mg/dL (ref 603–1613)
IgM (Immunoglobulin M), Srm: 49 mg/dL (ref 15–143)
M Protein SerPl Elph-Mcnc: 0.4 g/dL — ABNORMAL HIGH
Total Protein ELP: 6.9 g/dL (ref 6.0–8.5)

## 2023-10-22 NOTE — Telephone Encounter (Signed)
 Repeat EGD needed but cardiac and blood thinner clearance needed  Both clearances faxed

## 2023-10-26 ENCOUNTER — Telehealth: Payer: Self-pay

## 2023-10-26 NOTE — Telephone Encounter (Signed)
 Error

## 2023-10-26 NOTE — Telephone Encounter (Signed)
 Incoming fax from Dr Marea, hold Eliquis  2 days prior, restart 1 day after unless otherwise instructed   Called home and spouse's mobile #, ok per DPR-- no answer and Left message on voicemail

## 2023-10-26 NOTE — Telephone Encounter (Signed)
 The patient wife called back to speak to [facility].

## 2023-10-26 NOTE — Telephone Encounter (Signed)
 Cardiology clearance received and patient is cleared to have procedure.

## 2023-10-26 NOTE — Telephone Encounter (Signed)
 Clearance from cardiology received and has been cleared ... Awaiting clearance from Dr Marea

## 2023-10-27 ENCOUNTER — Telehealth: Payer: Self-pay

## 2023-10-27 NOTE — Telephone Encounter (Signed)
 Left message to return call to office.

## 2023-10-27 NOTE — Telephone Encounter (Signed)
 Pt  return call please call [phone].

## 2023-10-28 ENCOUNTER — Other Ambulatory Visit: Payer: Self-pay

## 2023-10-28 DIAGNOSIS — R1319 Other dysphagia: Secondary | ICD-10-CM

## 2023-10-28 NOTE — Telephone Encounter (Signed)
 EGD scheduled and instruction about medications hold and prep discussed with spouse, ok per DPR  Nothing further needed at this time

## 2023-11-03 ENCOUNTER — Encounter: Payer: Self-pay | Admitting: Internal Medicine

## 2023-11-03 ENCOUNTER — Inpatient Hospital Stay: Payer: Medicare Other | Attending: Internal Medicine | Admitting: Internal Medicine

## 2023-11-03 DIAGNOSIS — I2510 Atherosclerotic heart disease of native coronary artery without angina pectoris: Secondary | ICD-10-CM | POA: Diagnosis not present

## 2023-11-03 DIAGNOSIS — N183 Chronic kidney disease, stage 3 unspecified: Secondary | ICD-10-CM | POA: Insufficient documentation

## 2023-11-03 DIAGNOSIS — K219 Gastro-esophageal reflux disease without esophagitis: Secondary | ICD-10-CM | POA: Diagnosis not present

## 2023-11-03 DIAGNOSIS — Z7901 Long term (current) use of anticoagulants: Secondary | ICD-10-CM | POA: Diagnosis not present

## 2023-11-03 DIAGNOSIS — J4489 Other specified chronic obstructive pulmonary disease: Secondary | ICD-10-CM | POA: Diagnosis not present

## 2023-11-03 DIAGNOSIS — D469 Myelodysplastic syndrome, unspecified: Secondary | ICD-10-CM | POA: Diagnosis not present

## 2023-11-03 DIAGNOSIS — D472 Monoclonal gammopathy: Secondary | ICD-10-CM | POA: Diagnosis not present

## 2023-11-03 DIAGNOSIS — D46Z Other myelodysplastic syndromes: Secondary | ICD-10-CM | POA: Diagnosis not present

## 2023-11-03 DIAGNOSIS — Z79899 Other long term (current) drug therapy: Secondary | ICD-10-CM | POA: Diagnosis not present

## 2023-11-03 DIAGNOSIS — I129 Hypertensive chronic kidney disease with stage 1 through stage 4 chronic kidney disease, or unspecified chronic kidney disease: Secondary | ICD-10-CM | POA: Insufficient documentation

## 2023-11-03 DIAGNOSIS — Z809 Family history of malignant neoplasm, unspecified: Secondary | ICD-10-CM | POA: Insufficient documentation

## 2023-11-03 DIAGNOSIS — M549 Dorsalgia, unspecified: Secondary | ICD-10-CM | POA: Insufficient documentation

## 2023-11-03 DIAGNOSIS — E7800 Pure hypercholesterolemia, unspecified: Secondary | ICD-10-CM | POA: Diagnosis not present

## 2023-11-03 DIAGNOSIS — Z7982 Long term (current) use of aspirin: Secondary | ICD-10-CM | POA: Insufficient documentation

## 2023-11-03 DIAGNOSIS — R011 Cardiac murmur, unspecified: Secondary | ICD-10-CM | POA: Insufficient documentation

## 2023-11-03 DIAGNOSIS — Z87891 Personal history of nicotine dependence: Secondary | ICD-10-CM | POA: Insufficient documentation

## 2023-11-03 DIAGNOSIS — Z8673 Personal history of transient ischemic attack (TIA), and cerebral infarction without residual deficits: Secondary | ICD-10-CM | POA: Insufficient documentation

## 2023-11-03 DIAGNOSIS — R531 Weakness: Secondary | ICD-10-CM | POA: Diagnosis not present

## 2023-11-03 DIAGNOSIS — E1122 Type 2 diabetes mellitus with diabetic chronic kidney disease: Secondary | ICD-10-CM | POA: Diagnosis not present

## 2023-11-03 DIAGNOSIS — R5383 Other fatigue: Secondary | ICD-10-CM | POA: Insufficient documentation

## 2023-11-03 DIAGNOSIS — I700 Atherosclerosis of aorta: Secondary | ICD-10-CM | POA: Insufficient documentation

## 2023-11-03 DIAGNOSIS — I252 Old myocardial infarction: Secondary | ICD-10-CM | POA: Insufficient documentation

## 2023-11-03 NOTE — Assessment & Plan Note (Addendum)
#   Anemia: Hemoglobin:[[name] 2023] 12 MCV-110; normal white count/platelets.   [name] 2024-B12 folic acid  normal [PCP]; FEB 7th, CT scan AP-negative for hepatosplenomegaly or cirrhosis.# MARCH 2024-low-grade MDS - bone marrow biopsy hypercellular; clonal plasma cells noted; myeloma panel FISH panel negative.  Cytogenetics normal.    # I reviewed the pathophysiology of low-grade MDS the patient and his wife.  I also again reviewed the treatment options of MDS include growth factor injections, tablets versus chemotherapy.  Pending today foundation 1 hem results.  Again given no obvious clinical symptoms from underlying MDS-I would recommend surveillance.   # MGUS: immunofixation shows IgA monoclonal protein with kappa light chain specificity.  Kappa lambda light chain ratio= 2.72.  Bone marrow biopsy- March 2024-6% plasma cells.   MM  FISH PANEL- NEGATIVE- Again no role for any therapy at this time. Continue surveillance every 6 months  or so labs. Stable.   # COPD /cough-defer to pulmonary/Dr.Fleming-for further recommendations. Stable.   # Chronic kidney disease stage III-[Dr.Singh]-Stable.   # Chronic back pain-unrelated to MDS/MGUS; awaiting ablation with [facility]. Stable.   # A.fib/CAD [Dr.Kowalski/[facility]] - on eliquis - Stable.   # DISPOSITION: # follow up in 6 month MD; 2 weeks PRIOR- labs- cbc/cmp; LDH; MM panel; K/l light chains; iron studies;ferritin-; b12 levels -  Dr.B

## 2023-11-03 NOTE — Progress Notes (Signed)
 [facility] CONSULT NOTE  Patient Care Team: Gretel App, NP as PCP - General (Nurse Practitioner) Rennie Cindy SAUNDERS, MD as Consulting Physician (Oncology) Florencio Cara BIRCH, MD as Consulting Physician (Cardiology) Marea Selinda RAMAN, MD as Referring Physician (Vascular Surgery) [place], Sherran BROCKS, PA-C  CHIEF COMPLAINTS/PURPOSE OF CONSULTATION: ANEMIA  HEMATOLOGY HISTORY  # ANEMIA[Hb; MCV-platelets- WBC; Iron sat; ferritin;  GFR- CT/US ; EGD/colonoscopy-  # Anemia: Hemoglobin:[[name] 2023] 12 MCV-110; normal white count/platelets.   [name] 2024-B12 folic acid  normal [PCP]; FEB 7th, CT scan AP-negative for hepatosplenomegaly or cirrhosis.  MARCH 2024-bone marrow biopsy hypercellular; clonal plasma cells noted 6%; M-protein negative; kappa lambda light chain ratio= slightly abnormal;  myeloma panel FISH panel negative.  Cytogenetics normal.  MARCH 2024- foundation 1 heme-MDS panel.  HISTORY OF PRESENTING ILLNESS: HOH; inability because of back pain.  Accompanied by his wife.  Keith Cruz 83 y.o.  male patient with multiple medical problems including COPD/A-fib on Eliquis  CKD stage III and LOW GRADE MDS with mild anemia; MGUS on surveillance is here to review the results  blood work.   Patient has been having some extreme weakness today.  He admits to chronic weakness in general.  He continues to have chronic back pain.  Patient denies any night sweats.  Denies any lumps or bumps.  Denies any weight loss at this time.   Review of Systems  Constitutional:  Positive for malaise/fatigue. Negative for chills and diaphoresis.  HENT:  Negative for nosebleeds and sore throat.   Eyes:  Negative for double vision.  Respiratory:  Negative for cough, hemoptysis, sputum production, shortness of breath and wheezing.   Cardiovascular:  Negative for chest pain, palpitations, orthopnea and leg swelling.  Gastrointestinal:  Negative for abdominal pain, blood in stool, constipation, diarrhea,  heartburn, melena, nausea and vomiting.  Genitourinary:  Negative for dysuria, frequency and urgency.  Musculoskeletal:  Positive for back pain and joint pain.  Skin: Negative.  Negative for itching and rash.  Neurological:  Negative for dizziness, tingling, focal weakness, weakness and headaches.  Endo/Heme/Allergies:  Does not bruise/bleed easily.  Psychiatric/Behavioral:  Negative for depression. The patient is not nervous/anxious and does not have insomnia.      MEDICAL HISTORY:  Past Medical History:  Diagnosis Date   Arthritis    back   Asthma    Atherosclerosis of aorta (HCC)    Atherosclerosis of aorta (HCC)    Bronchitis 05/2017   chronic   CAD (coronary artery disease)    Carotid artery disease (HCC)    Carotid stenosis    Carotid stenosis, bilateral    Chronic bronchitis (HCC)    Chronic cough    COPD (chronic obstructive pulmonary disease) (HCC)    Dysrhythmia    GERD (gastroesophageal reflux disease)    Heart murmur    HOH (hard of hearing)    hearing aids   Hypercholesteremia    Hypertension    Kidney disease    stent   Macular degeneration of left eye    Myocardial infarction (HCC) 2016   Pre-diabetes    Seasonal allergies    Seizure (HCC)    Seizures (HCC)    viral meningitis   Stroke [facility])    1991 and imaging 05/12/15 old strokes   TIA (transient ischemic attack) 1991   x2   Uses hearing aid    Viral meningitis 2017   Viral meningitis    Wears dentures    upper and lower    SURGICAL HISTORY: Past  Surgical History:  Procedure Laterality Date   CAROTID ENDARTERECTOMY Left 2014   CAROTID ENDARTERECTOMY Right 1991   CAROTID PTA/STENT INTERVENTION Left 05/31/2020   Procedure: CAROTID PTA/STENT INTERVENTIO;  Surgeon: Marea Selinda RAMAN, MD;  Location: [facility] INVASIVE CV LAB;  Service: Cardiovascular;  Laterality: Left;   CATARACT EXTRACTION W/PHACO Left 06/24/2017   Procedure: CATARACT EXTRACTION PHACO AND INTRAOCULAR LENS PLACEMENT (IOC) LEFT BORDERLINE  DIABETIC;  Surgeon: Mittie Gaskin, MD;  Location: [facility];  Service: Ophthalmology;  Laterality: Left;   CATARACT EXTRACTION W/PHACO Right 07/22/2017   Procedure: CATARACT EXTRACTION PHACO AND INTRAOCULAR LENS PLACEMENT (IOC) RIGHT BORDERLINE DIABETIC;  Surgeon: Mittie Gaskin, MD;  Location: [facility];  Service: Ophthalmology;  Laterality: Right;   CORONARY ARTERY BYPASS GRAFT     x5   ESOPHAGOGASTRODUODENOSCOPY N/A 10/21/2023   Procedure: EGD (ESOPHAGOGASTRODUODENOSCOPY);  Surgeon: Jinny Carmine, MD;  Location: [facility] ENDOSCOPY;  Service: Endoscopy;  Laterality: N/A;   SKIN LESION EXCISION Left    left ear   URETERAL STENT PLACEMENT      SOCIAL HISTORY: Social History   Socioeconomic History   Marital status: Married    Spouse name: Not on file   Number of children: Not on file   Years of education: Not on file   Highest education level: 10th grade  Occupational History   Not on file  Tobacco Use   Smoking status: Former    Current packs/day: 0.00    Average packs/day: 2.0 packs/day for 10.0 years (20.0 ttl pk-yrs)    Types: Cigarettes    Start date: 04/28/1960    Quit date: 04/28/1970    Years since quitting: 53.5   Smokeless tobacco: Former  Building services engineer status: Never Used  Substance and Sexual Activity   Alcohol use: No    Alcohol/week: 0.0 standard drinks of alcohol    Comment: previous   Drug use: No   Sexual activity: Yes    Comment: wife   Other Topics Concern   Not on file  Social History Narrative   Re-married x 3 years wife named Audiological scientist pets, 1 dog.    Retired    Investment banker, operational, wears seat belt, safe in relationship    2 sons and 1 daughter    6 years [employer]    Social Drivers of Corporate investment banker Strain: Low Risk  (09/16/2023)   Received from [facility]   Overall Financial Resource Strain (CARDIA)    Difficulty of Paying Living Expenses: Not hard at all  Food Insecurity: No  Food Insecurity (09/16/2023)   Received [facility]   Hunger Vital Sign    Within the past 12 months, you worried that your food would run out before you got the money to buy more.: Never true    Within the past 12 months, the food you bought just didn't last and you didn't have money to get more.: Never true  Transportation Needs: No Transportation Needs (09/16/2023)   Received from [facility] - Transportation    In the past 12 months, has lack of transportation kept you from medical appointments or from getting medications?: No    Lack of Transportation (Non-Medical): No  Physical Activity: Inactive (05/14/2023)   Exercise Vital Sign    Days of Exercise per Week: 0 days    Minutes of Exercise per Session: 0 min  Stress: Stress Concern Present (05/14/2023)   [place]  [employer] - Occupational Stress Questionnaire    Feeling of Stress : Very much  Social Connections: Moderately Integrated (05/14/2023)   Social Connection and Isolation Panel    Frequency of Communication with Friends and Family: More than three times a week    Frequency of Social Gatherings with Friends and Family: Patient declined    Attends Religious Services: Never    Database administrator or Organizations: Yes    Attends Banker Meetings: Never    Marital Status: Married  Recent Concern: Social Connections - Moderately Isolated (02/13/2023)   Social Connection and Isolation Panel    Frequency of Communication with Friends and Family: More than three times a week    Frequency of Social Gatherings with Friends and Family: Once a week    Attends Religious Services: Never    Database administrator or Organizations: No    Attends Banker Meetings: Never    Marital Status: Married  Catering manager Violence: Not At Risk (02/16/2023)   Humiliation, Afraid, Rape, and Kick questionnaire    Fear of Current or Ex-Partner: No     Emotionally Abused: No    Physically Abused: No    Sexually Abused: No    FAMILY HISTORY: Family History  Problem Relation Age of Onset   Stroke Mother    Heart attack Mother    Dementia Mother    Stroke Father    Stroke Sister    Alzheimer's disease Sister    Dementia Sister    Dementia Sister    Stroke Brother    Alzheimer's disease Maternal Grandmother    Dementia Maternal Grandmother    Stroke Paternal Grandmother    Stroke Paternal Grandfather    Alzheimer's disease Paternal Grandfather    Cancer Neg Hx     ALLERGIES:  is allergic to fexofenadine -pseudoephed er, nifedipine, pseudoephedrine, anoro ellipta  [umeclidinium-vilanterol], carvedilol , doxycycline  hyclate, prednisone, and roflumilast.  MEDICATIONS:  Current Outpatient Medications  Medication Sig Dispense Refill   ACCU-CHEK GUIDE TEST test strip TEST BLOOD GLUCOSE ONCE DAILY 100 strip 12   acetaminophen  (TYLENOL ) 500 MG tablet Take 500 mg by mouth every 6 (six) hours as needed.     albuterol  (PROAIR  HFA) 108 (90 Base) MCG/ACT inhaler INHALE 1-2 PUFFS INTO THE LUNGS EVERY 4 (FOUR) HOURS AS NEEDED FOR WHEEZING OR SHORTNESS OF BREATH. 18 each 12   apixaban  (ELIQUIS ) 2.5 MG TABS tablet Take 1 tablet (2.5 mg total) by mouth 2 (two) times daily. 60 tablet 1   ARNUITY ELLIPTA  100 MCG/ACT AEPB Inhale 1 puff into the lungs daily.     aspirin  EC 81 MG tablet Take 81 mg by mouth daily.     atorvastatin  (LIPITOR ) 80 MG tablet TAKE 1 TABLET BY MOUTH EVERY DAY 90 tablet 3   blood glucose meter kit and supplies Dispense based on patient and insurance preference. Use as directed to check blood glucose once daily. 1 each 0   Cholecalciferol  (VITAMIN D3) 2000 UNITS capsule Take 2,000 Units by mouth daily. am     fexofenadine  (ALLEGRA ) 180 MG tablet Take 1 tablet (180 mg total) by mouth daily as needed for allergies or rhinitis. 90 tablet 3   fluticasone  (FLONASE ) 50 MCG/ACT nasal spray INSTILL 1 SPRAY INTO BOTH NOSTRILS DAILY 16 mL 2    folic acid  (FOLVITE ) 1 MG tablet Take 1 mg by mouth daily. am     hydrALAZINE  (APRESOLINE ) 50 MG tablet TAKE 1 TABLET BY MOUTH THREE TIMES A  DAY 270 tablet 3   metoprolol  tartrate (LOPRESSOR ) 50 MG tablet TAKE 1 TABLET (50 MG TOTAL) BY MOUTH 2 (TWO) TIMES DAILY. D/C COREG  PT PREFERS METOPROLOL  180 tablet 3   Multiple Vitamins-Minerals (CENTRUM SILVER 50+MEN PO) Take by mouth.     ondansetron  (ZOFRAN -ODT) 4 MG disintegrating tablet Take 1 tablet (4 mg total) by mouth every 8 (eight) hours as needed for nausea or vomiting. 20 tablet 0   tamsulosin  (FLOMAX ) 0.4 MG CAPS capsule Take 0.4 mg by mouth daily.     triamcinolone  cream (KENALOG ) 0.1 % APPLY TO AFFECTED AREA TWICE A DAY 30 g 5   Current Facility-Administered Medications  Medication Dose Route Frequency Provider Last Rate Last Admin   ipratropium-albuterol  (DUONEB) 0.5-2.5 (3) MG/3ML nebulizer solution 3 mL  3 mL Nebulization Once Karamalegos, Alexander J, DO         .  PHYSICAL EXAMINATION:   Vitals:   11/03/23 1050  BP: (!) 121/56  Pulse: 69  Resp: 19  Temp: 98.9 F (37.2 C)  SpO2: 97%   Filed Weights   11/03/23 1050  Weight: 218 lb (98.9 kg)    Physical Exam Vitals and nursing note reviewed.  HENT:     Head: Normocephalic and atraumatic.     Mouth/Throat:     Pharynx: Oropharynx is clear.  Eyes:     Extraocular Movements: Extraocular movements intact.     Pupils: Pupils are equal, round, and reactive to light.  Cardiovascular:     Rate and Rhythm: Normal rate and regular rhythm.  Pulmonary:     Comments: Decreased breath sounds bilaterally.  Abdominal:     Palpations: Abdomen is soft.  Musculoskeletal:        General: Normal range of motion.     Cervical back: Normal range of motion.  Skin:    General: Skin is warm.  Neurological:     General: No focal deficit present.     Mental Status: He is alert and oriented to person, place, and time.  Psychiatric:        Behavior: Behavior normal.         Judgment: Judgment normal.      LABORATORY DATA:  I have reviewed the data as listed Lab Results  Component Value Date   WBC 7.2 10/20/2023   HGB 11.1 (L) 10/20/2023   HCT 34.3 (L) 10/20/2023   MCV 109.2 (H) 10/20/2023   PLT 165 10/20/2023   Recent Labs    12/09/22 1058 12/10/22 0308 04/10/23 1027 10/20/23 1037  NA 136 137 140 137  K 3.9 3.7 4.3 4.3  CL 105 106 104 104  CO2 22 23 25 25   GLUCOSE 136* 115* 128* 117*  BUN 22 18 20 21   CREATININE 1.27* 1.23 1.24 1.44*  CALCIUM  8.9 8.5* 9.1 8.8*  GFRNONAA 56* 59* 58* 48*  PROT 7.8  --  7.7 7.2  ALBUMIN 4.2  --  4.1 3.7  AST 29  --  29 28  ALT 20  --  26 26  ALKPHOS 49  --  51 53  BILITOT 0.7  --  0.4 0.8     VAS US  CAROTID Result Date: 10/08/2023 Carotid Arterial Duplex Study Patient Name:  Keith Cruz  Date of Exam:   10/06/2023 Medical Rec #: [id number]       Accession #:    [id number] Date of Birth: 04/19/1941        Patient Gender: M Patient Age:   14 years Exam Location:  [facility] Procedure:      VAS US  CAROTID Referring Phys: SELINDA DEW --------------------------------------------------------------------------------  Indications:   Carotid artery disease. Other Factors: H/O bilateral carotid endarterectomies;                Redo right carotid endarterectomy at [facility] in 2021;                Left carotid stent on 05/2020;. Performing Technologist: Elsie Churn RT, RDMS, RVT  Examination Guidelines: A complete evaluation includes B-mode imaging, spectral Doppler, color Doppler, and power Doppler as needed of all accessible portions of each vessel. Bilateral testing is considered an integral part of a complete examination. Limited examinations for reoccurring indications may be performed as noted.  Right Carotid Findings: +----------+--------+--------+--------+------------------+--------+           PSV cm/sEDV cm/sStenosisPlaque DescriptionComments  +----------+--------+--------+--------+------------------+--------+ CCA Prox  101     14                                         +----------+--------+--------+--------+------------------+--------+ CCA Mid   87      14              homogeneous                +----------+--------+--------+--------+------------------+--------+ CCA Distal87      17                                         +----------+--------+--------+--------+------------------+--------+ ICA Prox  136     23      1-39%   homogeneous                +----------+--------+--------+--------+------------------+--------+ ICA Mid   85      21                                         +----------+--------+--------+--------+------------------+--------+ ICA Distal79      21                                         +----------+--------+--------+--------+------------------+--------+ ECA       259             >50%    heterogenous               +----------+--------+--------+--------+------------------+--------+ +----------+--------+-------+--------+-------------------+           PSV cm/sEDV cmsDescribeArm Pressure (mmHG) +----------+--------+-------+--------+-------------------+ [id number]            Stenotic                    +----------+--------+-------+--------+-------------------+ +---------+--------+--+--------+---------+ VertebralPSV cm/s79EDV cm/sAntegrade +---------+--------+--+--------+---------+ Left Carotid Findings: +----------+--------+--------+--------+------------------+--------+           PSV cm/sEDV cm/sStenosisPlaque DescriptionComments +----------+--------+--------+--------+------------------+--------+ CCA Prox  66      13                                         +----------+--------+--------+--------+------------------+--------+ CCA Mid   83      22  heterogenous               +----------+--------+--------+--------+------------------+--------+ CCA  Distal92      19              heterogenous      stent    +----------+--------+--------+--------+------------------+--------+ ICA Prox  89      20                                stent    +----------+--------+--------+--------+------------------+--------+ ICA Mid   101     30                                         +----------+--------+--------+--------+------------------+--------+ ICA Distal118     32                                         +----------+--------+--------+--------+------------------+--------+ ECA       83                      heterogenous               +----------+--------+--------+--------+------------------+--------+ +----------+--------+--------+----------------+-------------------+           PSV cm/sEDV cm/sDescribe        Arm Pressure (mmHG) +----------+--------+--------+----------------+-------------------+ [id number]             Multiphasic, WNL                    +----------+--------+--------+----------------+-------------------+ +---------+--------+--+--------+---------+ VertebralPSV cm/s91EDV cm/sAntegrade +---------+--------+--+--------+---------+  Summary: Right Carotid: Patent right endarterectomy site with no significant stenosis.                >50% stenosis of the ECA. Left Carotid: Patent stent with no significant stenosis. *See table(s) above for measurements and observations.  No significant change when compared to the previous exam on 10/07/22. Electronically signed by Selinda Gu MD on 10/08/2023 at 7:32:05 AM.    Final    VAS US  RENAL ARTERY DUPLEX Result Date: 10/08/2023 ABDOMINAL VISCERAL Patient Name:  Keith Cruz  Date of Exam:   10/06/2023 Medical Rec #: [id number]       Accession #:    [id number] Date of Birth: January 18, 1941        Patient Gender: M Patient Age:   79 years Exam Location:  [facility] Procedure:      VAS US  RENAL ARTERY DUPLEX Referring Phys: [id number] SELINDA RAMAN DEW  -------------------------------------------------------------------------------- Indications: F/U renal artery stent Vascular Interventions: Right renal artery stent in 2021;. Limitations: Air/bowel gas. Performing Technologist: Elsie Churn RT, RDMS, RVT  Examination Guidelines: A complete evaluation includes B-mode imaging, spectral Doppler, color Doppler, and power Doppler as needed of all accessible portions of each vessel. Bilateral testing is considered an integral part of a complete examination. Limited examinations for reoccurring indications may be performed as noted.  Duplex Findings: +---------+--------+--------+------+--------+          PSV cm/sEDV cm/sPlaqueComments +---------+--------+--------+------+--------+ Aorta Mid   63                          +---------+--------+--------+------+--------+  +------------------+--------+--------+-------+ Right Renal ArteryPSV cm/sEDV cm/sComment +------------------+--------+--------+-------+ Proximal  47                   +------------------+--------+--------+-------+ Mid                  61                   +------------------+--------+--------+-------+ Distal               47                   +------------------+--------+--------+-------+ +-----------------+--------+--------+-------+ Left Renal ArteryPSV cm/sEDV cm/sComment +-----------------+--------+--------+-------+ Proximal            53                   +-----------------+--------+--------+-------+ Mid                 48                   +-----------------+--------+--------+-------+ Distal              37                   +-----------------+--------+--------+-------+  +------------------+-----+------------------+-----+ Right Kidney           Left Kidney             +------------------+-----+------------------+-----+ RAR                    RAR                     +------------------+-----+------------------+-----+ RAR (manual)       0.97 RAR (manual)      0.84  +------------------+-----+------------------+-----+ Cortex                 Cortex                  +------------------+-----+------------------+-----+ Cortex thickness       Corex thickness         +------------------+-----+------------------+-----+ Kidney length (cm)10.40Kidney length (cm)11.78 +------------------+-----+------------------+-----+  Summary: Renal:  Right: No evidence of right renal artery stenosis. Normal size right        kidney. RRV flow present. Left:  No evidence of left renal artery stenosis. Normal size of        left kidney. LRV flow present.  *See table(s) above for measurements and observations.  No significant change when compared to the previous exam on 10/07/22. Diagnosing physician: Selinda Gu MD  Electronically signed by Selinda Gu MD on 10/08/2023 at 7:31:03 AM.    Final     ASSESSMENT & PLAN:   MDS (myelodysplastic syndrome), low grade (HCC) # Anemia: Hemoglobin:[[name] 2023] 12 MCV-110; normal white count/platelets.   [name] 2024-B12 folic acid  normal [PCP]; FEB 7th, CT scan AP-negative for hepatosplenomegaly or cirrhosis.# MARCH 2024-low-grade MDS - bone marrow biopsy hypercellular; clonal plasma cells noted; myeloma panel FISH panel negative.  Cytogenetics normal.    # I reviewed the pathophysiology of low-grade MDS the patient and his wife.  I also again reviewed the treatment options of MDS include growth factor injections, tablets versus chemotherapy.  Pending today foundation 1 hem results.  Again given no obvious clinical symptoms from underlying MDS-I would recommend surveillance.   # MGUS: immunofixation shows IgA monoclonal protein with kappa light chain specificity.  Kappa lambda light chain ratio= 2.72.  Bone marrow biopsy- March 2024-6% plasma cells.   MM  FISH PANEL- NEGATIVE- Again no role for any  therapy at this time. Continue surveillance every 6 months  or so labs. Stable.   # COPD /cough-defer to  pulmonary/Dr.Fleming-for further recommendations. Stable.   # Chronic kidney disease stage III-[Dr.Singh]-Stable.   # Chronic back pain-unrelated to MDS/MGUS; awaiting ablation with [facility]. Stable.   # A.fib/CAD [Dr.Kowalski/[facility]] - on eliquis - Stable.   # DISPOSITION: # follow up in 6 month MD; 2 weeks PRIOR- labs- cbc/cmp; LDH; MM panel; K/l light chains; iron studies;ferritin-; b12 levels -  Dr.B    All questions were answered. The patient knows to call the clinic with any problems, questions or concerns.   Cindy JONELLE Joe, MD 11/03/2023 11:48 AM

## 2023-11-03 NOTE — Progress Notes (Signed)
 Patient has been having some extreme weakness today.

## 2023-11-12 ENCOUNTER — Ambulatory Visit: Payer: Medicare Other | Admitting: Nurse Practitioner

## 2023-11-12 NOTE — Progress Notes (Deleted)
 [name], NP-C Phone: [phone]  Keith Cruz is a 83 y.o. male who presents today for follow up.   ***  Social History   Tobacco Use  Smoking Status Former   Current packs/day: 0.00   Average packs/day: 2.0 packs/day for 10.0 years (20.0 ttl pk-yrs)   Types: Cigarettes   Start date: 04/28/1960   Quit date: 04/28/1970   Years since quitting: 53.5  Smokeless Tobacco Former    Current Outpatient Medications on File Prior to Visit  Medication Sig Dispense Refill   ACCU-CHEK GUIDE TEST test strip TEST BLOOD GLUCOSE ONCE DAILY 100 strip 12   acetaminophen  (TYLENOL ) 500 MG tablet Take 500 mg by mouth every 6 (six) hours as needed.     albuterol  (PROAIR  HFA) 108 (90 Base) MCG/ACT inhaler INHALE 1-2 PUFFS INTO THE LUNGS EVERY 4 (FOUR) HOURS AS NEEDED FOR WHEEZING OR SHORTNESS OF BREATH. 18 each 12   apixaban  (ELIQUIS ) 2.5 MG TABS tablet Take 1 tablet (2.5 mg total) by mouth 2 (two) times daily. 60 tablet 1   ARNUITY ELLIPTA  100 MCG/ACT AEPB Inhale 1 puff into the lungs daily.     aspirin  EC 81 MG tablet Take 81 mg by mouth daily.     atorvastatin  (LIPITOR ) 80 MG tablet TAKE 1 TABLET BY MOUTH EVERY DAY 90 tablet 3   blood glucose meter kit and supplies Dispense based on patient and insurance preference. Use as directed to check blood glucose once daily. 1 each 0   Cholecalciferol  (VITAMIN D3) 2000 UNITS capsule Take 2,000 Units by mouth daily. am     fexofenadine  (ALLEGRA ) 180 MG tablet Take 1 tablet (180 mg total) by mouth daily as needed for allergies or rhinitis. 90 tablet 3   fluticasone  (FLONASE ) 50 MCG/ACT nasal spray INSTILL 1 SPRAY INTO BOTH NOSTRILS DAILY 16 mL 2   folic acid  (FOLVITE ) 1 MG tablet Take 1 mg by mouth daily. am     hydrALAZINE  (APRESOLINE ) 50 MG tablet TAKE 1 TABLET BY MOUTH THREE TIMES A DAY 270 tablet 3   metoprolol  tartrate (LOPRESSOR ) 50 MG tablet TAKE 1 TABLET (50 MG TOTAL) BY MOUTH 2 (TWO) TIMES DAILY. D/C COREG  PT PREFERS METOPROLOL  180 tablet 3    Multiple Vitamins-Minerals (CENTRUM SILVER 50+MEN PO) Take by mouth.     ondansetron  (ZOFRAN -ODT) 4 MG disintegrating tablet Take 1 tablet (4 mg total) by mouth every 8 (eight) hours as needed for nausea or vomiting. 20 tablet 0   tamsulosin  (FLOMAX ) 0.4 MG CAPS capsule Take 0.4 mg by mouth daily.     triamcinolone  cream (KENALOG ) 0.1 % APPLY TO AFFECTED AREA TWICE A DAY 30 g 5   Current Facility-Administered Medications on File Prior to Visit  Medication Dose Route Frequency Provider Last Rate Last Admin   ipratropium-albuterol  (DUONEB) 0.5-2.5 (3) MG/3ML nebulizer solution 3 mL  3 mL Nebulization Once Karamalegos, Alexander J, DO         ROS see history of present illness  Objective  Physical Exam There were no vitals filed for this visit.  BP Readings from Last 3 Encounters:  11/03/23 (!) 121/56  10/21/23 (!) 141/59  10/06/23 139/67   Wt Readings from Last 3 Encounters:  11/03/23 218 lb (98.9 kg)  10/21/23 218 lb (98.9 kg)  10/06/23 209 lb (94.8 kg)    Physical Exam   Assessment/Plan: Please see individual problem list.  There are no diagnoses linked to this encounter.   Health Maintenance: ***  No follow-ups on file.  [name], NP-C [facility]

## 2023-11-16 ENCOUNTER — Ambulatory Visit: Admitting: Anesthesiology

## 2023-11-16 ENCOUNTER — Encounter
Admission: RE | Disposition: A | Discharge status: Home / Self Care | Payer: Self-pay | Source: Home / Self Care | Attending: Gastroenterology

## 2023-11-16 ENCOUNTER — Other Ambulatory Visit: Payer: Self-pay

## 2023-11-16 ENCOUNTER — Encounter: Payer: Self-pay | Admitting: Gastroenterology

## 2023-11-16 ENCOUNTER — Ambulatory Visit
Admission: RE | Admit: 2023-11-16 | Discharge: 2023-11-16 | Disposition: A | Discharge status: Home / Self Care | Attending: Gastroenterology | Admitting: Gastroenterology

## 2023-11-16 DIAGNOSIS — I4891 Unspecified atrial fibrillation: Secondary | ICD-10-CM | POA: Diagnosis not present

## 2023-11-16 DIAGNOSIS — Z87891 Personal history of nicotine dependence: Secondary | ICD-10-CM | POA: Insufficient documentation

## 2023-11-16 DIAGNOSIS — Z951 Presence of aortocoronary bypass graft: Secondary | ICD-10-CM | POA: Diagnosis not present

## 2023-11-16 DIAGNOSIS — N1831 Chronic kidney disease, stage 3a: Secondary | ICD-10-CM | POA: Insufficient documentation

## 2023-11-16 DIAGNOSIS — R1319 Other dysphagia: Secondary | ICD-10-CM

## 2023-11-16 DIAGNOSIS — J4489 Other specified chronic obstructive pulmonary disease: Secondary | ICD-10-CM | POA: Insufficient documentation

## 2023-11-16 DIAGNOSIS — R1310 Dysphagia, unspecified: Secondary | ICD-10-CM | POA: Diagnosis not present

## 2023-11-16 DIAGNOSIS — Z8673 Personal history of transient ischemic attack (TIA), and cerebral infarction without residual deficits: Secondary | ICD-10-CM | POA: Insufficient documentation

## 2023-11-16 DIAGNOSIS — I129 Hypertensive chronic kidney disease with stage 1 through stage 4 chronic kidney disease, or unspecified chronic kidney disease: Secondary | ICD-10-CM | POA: Insufficient documentation

## 2023-11-16 DIAGNOSIS — I2510 Atherosclerotic heart disease of native coronary artery without angina pectoris: Secondary | ICD-10-CM | POA: Diagnosis not present

## 2023-11-16 DIAGNOSIS — K222 Esophageal obstruction: Secondary | ICD-10-CM | POA: Diagnosis not present

## 2023-11-16 DIAGNOSIS — I739 Peripheral vascular disease, unspecified: Secondary | ICD-10-CM | POA: Diagnosis not present

## 2023-11-16 HISTORY — PX: ESOPHAGOGASTRODUODENOSCOPY: SHX5428

## 2023-11-16 HISTORY — PX: ESOPHAGEAL DILATION: SHX303

## 2023-11-16 SURGERY — EGD (ESOPHAGOGASTRODUODENOSCOPY)
Anesthesia: General

## 2023-11-16 MED ORDER — SODIUM CHLORIDE 0.9 % IV SOLN: INTRAVENOUS | Status: DC

## 2023-11-16 MED ORDER — PROPOFOL 500 MG/50ML IV EMUL
INTRAVENOUS | Status: DC | PRN
  Administered 2023-11-16: 20 mg via INTRAVENOUS
  Administered 2023-11-16: 50 mg via INTRAVENOUS

## 2023-11-16 MED ORDER — LIDOCAINE HCL (CARDIAC) PF 100 MG/5ML IV SOSY
PREFILLED_SYRINGE | INTRAVENOUS | Status: DC | PRN
  Administered 2023-11-16 (×2): 100 mg via INTRAVENOUS

## 2023-11-16 NOTE — Anesthesia Postprocedure Evaluation (Signed)
 Anesthesia Post Note  Patient: Keith Cruz  Procedure(s) Performed: EGD (ESOPHAGOGASTRODUODENOSCOPY)  Patient location during evaluation: Endoscopy Anesthesia Type: General Level of consciousness: awake and alert Pain management: pain level controlled Vital Signs Assessment: post-procedure vital signs reviewed and stable Respiratory status: spontaneous breathing, nonlabored ventilation and respiratory function stable Cardiovascular status: blood pressure returned to baseline and stable Postop Assessment: no apparent nausea or vomiting Anesthetic complications: no   There were no known notable events for this encounter.   Last Vitals:  Vitals:   11/16/23 1109 11/16/23 1119  BP: (!) 148/60 (!) 168/73  Pulse: (!) 57 (!) 56  Resp: 18 16  Temp:    SpO2: 95% 99%    Last Pain:  Vitals:   11/16/23 1119  TempSrc:   PainSc: 0-No pain                 Camellia Merilee Louder

## 2023-11-16 NOTE — Op Note (Signed)
 [facility] Gastroenterology Patient Name: Keith Cruz Procedure Date: 11/16/2023 10:39 AM MRN: [id number] Account #: [id number] Date of Birth: [birth year] Admit Type: Outpatient Age: 84 Room: [facility] ENDO ROOM 4 Gender: Male Note Status: Finalized Instrument Name: Upper Endoscope [id number] Procedure:             Upper GI endoscopy Indications:           Dysphagia Providers:             [name] MD, MD Referring MD:          [name] (Referring MD) Medicines:             Propofol  per Anesthesia Complications:         No immediate complications. Procedure:             Pre-Anesthesia Assessment:                        - Prior to the procedure, a History and Physical was                         performed, and patient medications and allergies were                         reviewed. The patient's tolerance of previous                         anesthesia was also reviewed. The risks and benefits                         of the procedure and the sedation options and risks                         were discussed with the patient. All questions were                         answered, and informed consent was obtained. Prior                         Anticoagulants: The patient has taken no anticoagulant                         or antiplatelet agents. ASA Grade Assessment: II - A                         patient with mild systemic disease. After reviewing                         the risks and benefits, the patient was deemed in                         satisfactory condition to undergo the procedure.                        After obtaining informed consent, the endoscope was                         passed under direct vision. Throughout the procedure,  the patient's blood pressure, pulse, and oxygen                         saturations were monitored continuously. The Endoscope                         was introduced through the mouth, and advanced to the                          second part of duodenum. The upper GI endoscopy was                         accomplished without difficulty. The patient tolerated                         the procedure well. Findings:      One benign-appearing, intrinsic mild stenosis was found at the       gastroesophageal junction. This stenosis measured 1.2 cm (inner       diameter). The stenosis was traversed. A TTS dilator was passed through       the scope. Dilation with a 12-13.5-15 mm balloon dilator was performed       to 15 mm. The dilation site was examined following endoscope reinsertion       and showed moderate improvement in luminal narrowing.      The stomach was normal.      The examined duodenum was normal. Impression:            - Benign-appearing esophageal stenosis. Dilated.                        - Normal stomach.                        - Normal examined duodenum.                        - No specimens collected. Recommendation:        - Discharge patient to home.                        - Resume previous diet.                        - Continue present medications.                        - Repeat upper endoscopy PRN for retreatment. Procedure Code(s):     --- Professional ---                        [phone], Esophagogastroduodenoscopy, flexible,                         transoral; with transendoscopic balloon dilation of                         esophagus (less than 30 mm diameter) Diagnosis Code(s):     --- Professional ---                        R13.10, Dysphagia, unspecified  K22.2, Esophageal obstruction CPT copyright 2022 American Medical Association. All rights reserved. The codes documented in this report are preliminary and upon coder review may  be revised to meet current compliance requirements. [name] MD, MD 11/16/2023 10:58:50 AM This report has been signed electronically. Number of Addenda: 0 Note Initiated On: 11/16/2023 10:39 AM Estimated Blood Loss:   Estimated blood loss: none.      [facility]

## 2023-11-16 NOTE — Transfer of Care (Signed)
 Immediate Anesthesia Transfer of Care Note  Patient: Keith Cruz  Procedure(s) Performed: EGD (ESOPHAGOGASTRODUODENOSCOPY)  Patient Location: PACU  Anesthesia Type:General  Level of Consciousness: drowsy and patient cooperative  Airway & Oxygen Therapy: Patient Spontanous Breathing  Post-op Assessment: Report given to RN and Post -op Vital signs reviewed and stable  Post vital signs: stable  Last Vitals:  Vitals Value Taken Time  BP    Temp    Pulse 56 11/16/23 11:00  Resp 12 11/16/23 11:00  SpO2 97 % 11/16/23 11:00  Vitals shown include unfiled device data.  Last Pain:  Vitals:   11/16/23 1028  TempSrc: Temporal  PainSc: 0-No pain         Complications: No notable events documented.

## 2023-11-16 NOTE — Anesthesia Preprocedure Evaluation (Signed)
 Anesthesia Evaluation  Patient identified by MRN, date of birth, ID band Patient awake    Reviewed: Allergy & Precautions, NPO status , Patient's Chart, lab work & pertinent test results  History of Anesthesia Complications Negative for: history of anesthetic complications  Airway Mallampati: III  TM Distance: >3 FB Neck ROM: Full    Dental  (+) Upper Dentures, Partial Lower   Pulmonary asthma , former smoker (quit 1972)   Pulmonary exam normal breath sounds clear to auscultation       Cardiovascular Exercise Tolerance: Good hypertension, + CAD (s/p CABG 5V 2015) and + Peripheral Vascular Disease (s/p CEA 2016, renal stent 2010)  Normal cardiovascular exam+ dysrhythmias (a fib)  Rhythm:Regular Rate:Normal  ECG 12/09/22: SR; LBBB  Echo 12/27/19:    NORMAL LEFT VENTRICULAR SYSTOLIC FUNCTION    NORMAL LA PRESSURES WITH NORMAL DIASTOLIC FUNCTION    NORMAL RIGHT VENTRICULAR SYSTOLIC FUNCTION    VALVULAR REGURGITATION: TRIVIAL PR, TRIVIAL TR    NO VALVULAR STENOSIS    SALINE MICROCAVITATIONS ARE NORMAL AT REST AND AFTER VALSALVA   Myocardial perfusion 09/28/19:  Indeterminant Lexiscan  infusion EKG due to baseline EKG changes and left  bundle branch block  Patient did have some artifact consistent with lateral wall issues  Normal myocardial perfusion without evidence of myocardial ischemia      Neuro/Psych Seizures -,  PSYCHIATRIC DISORDERS Anxiety Depression    TIA (1991; no deficits)   GI/Hepatic ,GERD  ,,  Endo/Other  Pre-diabetes  Renal/GU Renal disease (stage III CKD)     Musculoskeletal   Abdominal   Peds  Hematology negative hematology ROS (+)   Anesthesia Other Findings Cardiology note 07/10/23:  83 y.o. male with  1. Coronary artery disease involving native coronary artery of native heart without angina pectoris  2. S/P CABG x 5: LIMA to LAD, SVG to PDA, SVG to Diag, SVG to OM1, SVG to PLB  3. History of  atrial fibrillation  4. Benign essential hypertension  5. Bilateral carotid artery stenosis  6. S/P carotid endarterectomy  7. Stage 3a chronic kidney disease (CMS/HHS-HCC)  8. Pulmonary emphysema, unspecified emphysema type (CMS/HHS-HCC)  9. Hyperlipidemia with target LDL less than 70  10. Preoperative clearance  11. Vertigo  12. Ataxia  13. Former smoker   Plan  Frequent falls related to ataxia possibly related to TIA CVA CVA recommend physical therapy follow-up with neurology Hyperlipidemia hyperlipidemia continue Lipitor  therapy. Continue statin therapy consider switching to injectable if unable to tolerate statin Peripheral vascular disease follow-up with vascular for peripheral vascular disease status post carotid endarterectomy in the past Chronic renal insufficiency reasonably stable continue adequate hydration renal perfusion follow-up with nephrology Former smoker advised patient to continue to refrain from tobacco abuse Coronary bypass surgery x 5 stable denies any significant recent anginal continue Eliquis  metoprolol  Atrial fibrillation paroxysmal on Eliquis  for anticoagulation metoprolol  for rate Hypertension reasonably controlled on metoprolol  was on hydralazine  History of COPD emphysema continue inhalers as necessary follow-up with pulmonary Have the patient follow-up in 6 months  Return in about 6 months (around 01/10/2024).    Reproductive/Obstetrics                              Anesthesia Physical Anesthesia Plan  ASA: 3  Anesthesia Plan: General   Post-op Pain Management:    Induction: Intravenous  PONV Risk Score and Plan: 2 and Treatment may vary due to age or medical condition, Propofol  infusion and  TIVA  Airway Management Planned: Natural Airway  Additional Equipment:   Intra-op Plan:   Post-operative Plan:   Informed Consent: I have reviewed the patients History and Physical, chart, labs and discussed the procedure  including the risks, benefits and alternatives for the proposed anesthesia with the patient or authorized representative who has indicated his/her understanding and acceptance.       Plan Discussed with: CRNA  Anesthesia Plan Comments: (LMA/GETA backup discussed.  Patient consented for risks of anesthesia including but not limited to:  - adverse reactions to medications - damage to eyes, teeth, lips or other oral mucosa - nerve damage due to positioning  - sore throat or hoarseness - damage to heart, brain, nerves, lungs, other parts of body or loss of life  Informed patient about role of CRNA in peri- and intra-operative care.  Patient voiced understanding.)         Anesthesia Quick Evaluation

## 2023-11-16 NOTE — H&P (Signed)
 Keith Copping, MD [facility] [address] [place], [place] [address] [phone] Fax : [phone]  Primary Care Physician:  Gretel App, NP Primary Gastroenterologist:  Dr. Copping  Pre-Procedure History & Physical: HPI:  Keith Cruz is a 83 y.o. male is here for an endoscopy.   Past Medical History:  Diagnosis Date   Arthritis    back   Asthma    Atherosclerosis of aorta (HCC)    Atherosclerosis of aorta (HCC)    Bronchitis 05/2017   chronic   CAD (coronary artery disease)    Carotid artery disease (HCC)    Carotid stenosis    Carotid stenosis, bilateral    Chronic bronchitis (HCC)    Chronic cough    COPD (chronic obstructive pulmonary disease) (HCC)    Dysrhythmia    GERD (gastroesophageal reflux disease)    Heart murmur    HOH (hard of hearing)    hearing aids   Hypercholesteremia    Hypertension    Kidney disease    stent   Macular degeneration of left eye    Myocardial infarction (HCC) 2016   Pre-diabetes    Seasonal allergies    Seizure (HCC)    Seizures (HCC)    viral meningitis   Stroke [facility])    1991 and imaging 05/12/15 old strokes   TIA (transient ischemic attack) 1991   x2   Uses hearing aid    Viral meningitis 2017   Viral meningitis    Wears dentures    upper and lower    Past Surgical History:  Procedure Laterality Date   CAROTID ENDARTERECTOMY Left 2014   CAROTID ENDARTERECTOMY Right 1991   CAROTID PTA/STENT INTERVENTION Left 05/31/2020   Procedure: CAROTID PTA/STENT INTERVENTIO;  Surgeon: Marea Selinda RAMAN, MD;  Location: [facility] INVASIVE CV LAB;  Service: Cardiovascular;  Laterality: Left;   CATARACT EXTRACTION W/PHACO Left 06/24/2017   Procedure: CATARACT EXTRACTION PHACO AND INTRAOCULAR LENS PLACEMENT (IOC) LEFT BORDERLINE DIABETIC;  Surgeon: Mittie Gaskin, MD;  Location: [facility];  Service: Ophthalmology;  Laterality: Left;   CATARACT EXTRACTION W/PHACO Right 07/22/2017   Procedure: CATARACT EXTRACTION PHACO AND  INTRAOCULAR LENS PLACEMENT (IOC) RIGHT BORDERLINE DIABETIC;  Surgeon: Mittie Gaskin, MD;  Location: [facility];  Service: Ophthalmology;  Laterality: Right;   CORONARY ARTERY BYPASS GRAFT     x5   ESOPHAGOGASTRODUODENOSCOPY N/A 10/21/2023   Procedure: EGD (ESOPHAGOGASTRODUODENOSCOPY);  Surgeon: Cruz Rogelia, MD;  Location: [facility] ENDOSCOPY;  Service: Endoscopy;  Laterality: N/A;   SKIN LESION EXCISION Left    left ear   URETERAL STENT PLACEMENT      Prior to Admission medications   Medication Sig Start Date End Date Taking? Authorizing Provider  ARNUITY ELLIPTA  100 MCG/ACT AEPB Inhale 1 puff into the lungs daily. 10/08/22  Yes [provider]  atorvastatin  (LIPITOR ) 80 MG tablet TAKE 1 TABLET BY MOUTH EVERY DAY 10/20/23  Yes Gretel App, NP  Cholecalciferol  (VITAMIN D3) 2000 UNITS capsule Take 2,000 Units by mouth daily. am   Yes [provider]  folic acid  (FOLVITE ) 1 MG tablet Take 1 mg by mouth daily. am   Yes [provider]  hydrALAZINE  (APRESOLINE ) 50 MG tablet TAKE 1 TABLET BY MOUTH THREE TIMES A DAY 10/20/23  Yes Gretel App, NP  metoprolol  tartrate (LOPRESSOR ) 50 MG tablet TAKE 1 TABLET (50 MG TOTAL) BY MOUTH 2 (TWO) TIMES DAILY. D/C COREG  PT PREFERS METOPROLOL  10/20/23  Yes Gretel App, NP  Multiple Vitamins-Minerals (CENTRUM SILVER 50+MEN PO)  Take by mouth.   Yes [provider]  ACCU-CHEK GUIDE TEST test strip TEST BLOOD GLUCOSE ONCE DAILY 10/20/23   Gretel App, NP  acetaminophen  (TYLENOL ) 500 MG tablet Take 500 mg by mouth every 6 (six) hours as needed.    [provider]  albuterol  (PROAIR  HFA) 108 (90 Base) MCG/ACT inhaler INHALE 1-2 PUFFS INTO THE LUNGS EVERY 4 (FOUR) HOURS AS NEEDED FOR WHEEZING OR SHORTNESS OF BREATH. 11/22/21   McLean-Scocuzza, Randine SAILOR, MD  apixaban  (ELIQUIS ) 2.5 MG TABS tablet Take 1 tablet (2.5 mg total) by mouth 2 (two) times daily. 10/06/23   Marea Selinda RAMAN, MD  aspirin  EC 81 MG tablet Take 81 mg by  mouth daily.    [provider]  blood glucose meter kit and supplies Dispense based on patient and insurance preference. Use as directed to check blood glucose once daily. 10/14/22   Gretel App, NP  fexofenadine  (ALLEGRA ) 180 MG tablet Take 1 tablet (180 mg total) by mouth daily as needed for allergies or rhinitis. 11/22/21   McLean-Scocuzza, Randine SAILOR, MD  fluticasone  (FLONASE ) 50 MCG/ACT nasal spray INSTILL 1 SPRAY INTO BOTH NOSTRILS DAILY 10/05/23   Gretel App, NP  ondansetron  (ZOFRAN -ODT) 4 MG disintegrating tablet Take 1 tablet (4 mg total) by mouth every 8 (eight) hours as needed for nausea or vomiting. 06/24/23   Gretel App, NP  tamsulosin  (FLOMAX ) 0.4 MG CAPS capsule Take 0.4 mg by mouth daily. Patient not taking: Reported on 11/16/2023 12/17/20   [provider]  triamcinolone  cream (KENALOG ) 0.1 % APPLY TO AFFECTED AREA TWICE A DAY 02/13/23   Gretel App, NP    Allergies as of 10/28/2023 - Review Complete 10/21/2023  Allergen Reaction Noted   Fexofenadine -pseudoephed er Swelling 01/22/2015   Nifedipine Swelling 01/22/2015   Pseudoephedrine Swelling 02/14/2020   Anoro ellipta  [umeclidinium-vilanterol]  05/11/2018   Carvedilol  Other (See Comments) 01/30/2020   Doxycycline  hyclate Other (See Comments) 05/28/2022   Prednisone Other (See Comments) 01/22/2015   Roflumilast Other (See Comments) 03/17/2023    Family History  Problem Relation Age of Onset   Stroke Mother    Heart attack Mother    Dementia Mother    Stroke Father    Stroke Sister    Alzheimer's disease Sister    Dementia Sister    Dementia Sister    Stroke Brother    Alzheimer's disease Maternal Grandmother    Dementia Maternal Grandmother    Stroke Paternal Grandmother    Stroke Paternal Grandfather    Alzheimer's disease Paternal Grandfather    Cancer Neg Hx     Social History   Socioeconomic History   Marital status: Married    Spouse name: Not on file   Number of children: Not on  file   Years of education: Not on file   Highest education level: 10th grade  Occupational History   Not on file  Tobacco Use   Smoking status: Former    Current packs/day: 0.00    Average packs/day: 2.0 packs/day for 10.0 years (20.0 ttl pk-yrs)    Types: Cigarettes    Start date: 04/28/1960    Quit date: 04/28/1970    Years since quitting: 53.5   Smokeless tobacco: Former  Building services engineer status: Never Used  Substance and Sexual Activity   Alcohol use: No    Alcohol/week: 0.0 standard drinks of alcohol    Comment: previous   Drug use: No   Sexual activity: Yes    Comment:  wife   Other Topics Concern   Not on file  Social History Narrative   Re-married x 3 years wife named Audiological scientist pets, 1 dog.    Retired    Investment banker, operational, wears seat belt, safe in relationship    2 sons and 1 daughter    6 years [employer]    Social Drivers of Corporate investment banker Strain: Low Risk  (09/16/2023)   Received from [facility]   Overall Financial Resource Strain (CARDIA)    Difficulty of Paying Living Expenses: Not hard at all  Food Insecurity: No Food Insecurity (09/16/2023)   Received from [facility]   Hunger Vital Sign    Within the past 12 months, you worried that your food would run out before you got the money to buy more.: Never true    Within the past 12 months, the food you bought just didn't last and you didn't have money to get more.: Never true  Transportation Needs: No Transportation Needs (09/16/2023)   Received from [facility] - Transportation    In the past 12 months, has lack of transportation kept you from medical appointments or from getting medications?: No    Lack of Transportation (Non-Medical): No  Physical Activity: Inactive (05/14/2023)   Exercise Vital Sign    Days of Exercise per Week: 0 days    Minutes of Exercise per Session: 0 min  Stress: Stress Concern Present (05/14/2023)    [employer] of Occupational Health - Occupational Stress Questionnaire    Feeling of Stress : Very much  Social Connections: Moderately Integrated (05/14/2023)   Social Connection and Isolation Panel    Frequency of Communication with Friends and Family: More than three times a week    Frequency of Social Gatherings with Friends and Family: Patient declined    Attends Religious Services: Never    Database administrator or Organizations: Yes    Attends Banker Meetings: Never    Marital Status: Married  Recent Concern: Social Connections - Moderately Isolated (02/13/2023)   Social Connection and Isolation Panel    Frequency of Communication with Friends and Family: More than three times a week    Frequency of Social Gatherings with Friends and Family: Once a week    Attends Religious Services: Never    Database administrator or Organizations: No    Attends Banker Meetings: Never    Marital Status: Married  Catering manager Violence: Not At Risk (02/16/2023)   Humiliation, Afraid, Rape, and Kick questionnaire    Fear of Current or Ex-Partner: No    Emotionally Abused: No    Physically Abused: No    Sexually Abused: No    Review of Systems: See HPI, otherwise negative ROS  Physical Exam: BP (!) 157/68   Pulse (!) 59   Temp (!) 97.1 F (36.2 C) (Temporal)   Resp 16   Ht 6' 2 (1.88 m)   Wt 98.9 kg   SpO2 97%   BMI 27.99 kg/m  General:   Alert,  pleasant and cooperative in NAD Head:  Normocephalic and atraumatic. Neck:  Supple; no masses or thyromegaly. Lungs:  Clear throughout to auscultation.    Heart:  Regular rate and rhythm. Abdomen:  Soft, nontender and nondistended. Normal bowel sounds, without guarding, and without rebound.   Neurologic:  Alert and  oriented x4;  grossly normal neurologically.  Impression/Plan: Keith  JAWARA Cruz is here for an endoscopy to be performed for dysphagia  Risks, benefits, limitations, and alternatives  regarding  endoscopy have been reviewed with the patient.  Questions have been answered.  All parties agreeable.   Keith Copping, MD  11/16/2023, 10:40 AM

## 2023-11-17 ENCOUNTER — Encounter: Payer: Self-pay | Admitting: Gastroenterology

## 2023-11-18 ENCOUNTER — Telehealth: Payer: Self-pay

## 2023-11-18 ENCOUNTER — Encounter: Payer: Self-pay | Admitting: Gastroenterology

## 2023-11-18 NOTE — Telephone Encounter (Signed)
 Incoming fax from Dr Marea, hold Eliquis  2 days prior, restart 1 day after unless otherwise instructed   Left message on voicemail for pt to return call to schedule EGD

## 2023-11-24 DIAGNOSIS — I129 Hypertensive chronic kidney disease with stage 1 through stage 4 chronic kidney disease, or unspecified chronic kidney disease: Secondary | ICD-10-CM | POA: Diagnosis not present

## 2023-11-24 DIAGNOSIS — N1831 Chronic kidney disease, stage 3a: Secondary | ICD-10-CM | POA: Diagnosis not present

## 2023-11-24 DIAGNOSIS — I701 Atherosclerosis of renal artery: Secondary | ICD-10-CM | POA: Diagnosis not present

## 2023-11-24 DIAGNOSIS — D631 Anemia in chronic kidney disease: Secondary | ICD-10-CM | POA: Diagnosis not present

## 2023-11-24 DIAGNOSIS — I10 Essential (primary) hypertension: Secondary | ICD-10-CM | POA: Diagnosis not present

## 2023-11-30 DIAGNOSIS — D631 Anemia in chronic kidney disease: Secondary | ICD-10-CM | POA: Diagnosis not present

## 2023-11-30 DIAGNOSIS — I10 Essential (primary) hypertension: Secondary | ICD-10-CM | POA: Diagnosis not present

## 2023-11-30 DIAGNOSIS — I129 Hypertensive chronic kidney disease with stage 1 through stage 4 chronic kidney disease, or unspecified chronic kidney disease: Secondary | ICD-10-CM | POA: Diagnosis not present

## 2023-11-30 DIAGNOSIS — N1831 Chronic kidney disease, stage 3a: Secondary | ICD-10-CM | POA: Diagnosis not present

## 2023-11-30 DIAGNOSIS — I701 Atherosclerosis of renal artery: Secondary | ICD-10-CM | POA: Diagnosis not present

## 2023-11-30 NOTE — Telephone Encounter (Signed)
 Left message on voicemail.

## 2023-12-05 ENCOUNTER — Other Ambulatory Visit (INDEPENDENT_AMBULATORY_CARE_PROVIDER_SITE_OTHER): Payer: Self-pay | Admitting: Vascular Surgery

## 2023-12-11 ENCOUNTER — Telehealth: Payer: Self-pay | Admitting: Nurse Practitioner

## 2023-12-11 ENCOUNTER — Ambulatory Visit: Admitting: Nurse Practitioner

## 2023-12-11 NOTE — Telephone Encounter (Signed)
 Left message and sent MyChart message, appointment for today canceled. 8/15/205.

## 2023-12-12 ENCOUNTER — Emergency Department

## 2023-12-12 ENCOUNTER — Observation Stay (HOSPITAL_BASED_OUTPATIENT_CLINIC_OR_DEPARTMENT_OTHER)
Admit: 2023-12-12 | Discharge: 2023-12-12 | Disposition: A | Discharge status: Home / Self Care | Attending: Hospitalist | Admitting: Hospitalist

## 2023-12-12 ENCOUNTER — Observation Stay
Admission: EM | Admit: 2023-12-12 | Discharge: 2023-12-13 | Disposition: A | Discharge status: Home / Self Care | Attending: Student in an Organized Health Care Education/Training Program | Admitting: Student in an Organized Health Care Education/Training Program

## 2023-12-12 ENCOUNTER — Other Ambulatory Visit: Payer: Self-pay

## 2023-12-12 ENCOUNTER — Observation Stay

## 2023-12-12 DIAGNOSIS — G459 Transient cerebral ischemic attack, unspecified: Secondary | ICD-10-CM | POA: Diagnosis not present

## 2023-12-12 DIAGNOSIS — Z7901 Long term (current) use of anticoagulants: Secondary | ICD-10-CM | POA: Insufficient documentation

## 2023-12-12 DIAGNOSIS — R29701 NIHSS score 1: Secondary | ICD-10-CM | POA: Diagnosis not present

## 2023-12-12 DIAGNOSIS — E785 Hyperlipidemia, unspecified: Secondary | ICD-10-CM | POA: Diagnosis not present

## 2023-12-12 DIAGNOSIS — I6389 Other cerebral infarction: Secondary | ICD-10-CM

## 2023-12-12 DIAGNOSIS — R531 Weakness: Secondary | ICD-10-CM | POA: Diagnosis not present

## 2023-12-12 DIAGNOSIS — Z8673 Personal history of transient ischemic attack (TIA), and cerebral infarction without residual deficits: Secondary | ICD-10-CM | POA: Insufficient documentation

## 2023-12-12 DIAGNOSIS — I129 Hypertensive chronic kidney disease with stage 1 through stage 4 chronic kidney disease, or unspecified chronic kidney disease: Secondary | ICD-10-CM | POA: Insufficient documentation

## 2023-12-12 DIAGNOSIS — R2990 Unspecified symptoms and signs involving the nervous system: Secondary | ICD-10-CM

## 2023-12-12 DIAGNOSIS — R200 Anesthesia of skin: Principal | ICD-10-CM | POA: Insufficient documentation

## 2023-12-12 DIAGNOSIS — R404 Transient alteration of awareness: Secondary | ICD-10-CM | POA: Diagnosis not present

## 2023-12-12 DIAGNOSIS — Z87891 Personal history of nicotine dependence: Secondary | ICD-10-CM | POA: Diagnosis not present

## 2023-12-12 DIAGNOSIS — J449 Chronic obstructive pulmonary disease, unspecified: Secondary | ICD-10-CM | POA: Diagnosis not present

## 2023-12-12 DIAGNOSIS — I25118 Atherosclerotic heart disease of native coronary artery with other forms of angina pectoris: Secondary | ICD-10-CM | POA: Diagnosis not present

## 2023-12-12 DIAGNOSIS — I639 Cerebral infarction, unspecified: Secondary | ICD-10-CM | POA: Diagnosis not present

## 2023-12-12 DIAGNOSIS — I4891 Unspecified atrial fibrillation: Secondary | ICD-10-CM | POA: Diagnosis present

## 2023-12-12 DIAGNOSIS — K219 Gastro-esophageal reflux disease without esophagitis: Secondary | ICD-10-CM | POA: Diagnosis not present

## 2023-12-12 DIAGNOSIS — N183 Chronic kidney disease, stage 3 unspecified: Secondary | ICD-10-CM | POA: Diagnosis present

## 2023-12-12 DIAGNOSIS — Z79899 Other long term (current) drug therapy: Secondary | ICD-10-CM | POA: Diagnosis not present

## 2023-12-12 DIAGNOSIS — G8190 Hemiplegia, unspecified affecting unspecified side: Secondary | ICD-10-CM | POA: Diagnosis not present

## 2023-12-12 DIAGNOSIS — I6523 Occlusion and stenosis of bilateral carotid arteries: Secondary | ICD-10-CM | POA: Diagnosis not present

## 2023-12-12 DIAGNOSIS — I10 Essential (primary) hypertension: Secondary | ICD-10-CM | POA: Diagnosis not present

## 2023-12-12 DIAGNOSIS — R29818 Other symptoms and signs involving the nervous system: Secondary | ICD-10-CM | POA: Diagnosis not present

## 2023-12-12 DIAGNOSIS — I6782 Cerebral ischemia: Secondary | ICD-10-CM | POA: Diagnosis not present

## 2023-12-12 DIAGNOSIS — Z743 Need for continuous supervision: Secondary | ICD-10-CM | POA: Diagnosis not present

## 2023-12-12 LAB — COMPREHENSIVE METABOLIC PANEL WITH GFR
ALT: 21 U/L (ref 0–44)
AST: 29 U/L (ref 15–41)
Albumin: 3.5 g/dL (ref 3.5–5.0)
Alkaline Phosphatase: 47 U/L (ref 38–126)
Anion gap: 10 (ref 5–15)
BUN: 19 mg/dL (ref 8–23)
CO2: 25 mmol/L (ref 22–32)
Calcium: 8.6 mg/dL — ABNORMAL LOW (ref 8.9–10.3)
Chloride: 105 mmol/L (ref 98–111)
Creatinine, Ser: 1.33 mg/dL — ABNORMAL HIGH (ref 0.61–1.24)
GFR, Estimated: 53 mL/min — ABNORMAL LOW (ref >60)
Glucose, Bld: 122 mg/dL — ABNORMAL HIGH (ref 70–99)
Potassium: 3.9 mmol/L (ref 3.5–5.1)
Sodium: 140 mmol/L (ref 135–145)
Total Bilirubin: 1 mg/dL (ref 0.0–1.2)
Total Protein: 7 g/dL (ref 6.5–8.1)

## 2023-12-12 LAB — CBC WITH DIFFERENTIAL/PLATELET
Abs Immature Granulocytes: 0.02 K/uL (ref 0.00–0.07)
Basophils Absolute: 0 K/uL (ref 0.0–0.1)
Basophils Relative: 1 %
Eosinophils Absolute: 0.1 K/uL (ref 0.0–0.5)
Eosinophils Relative: 1 %
HCT: 32.5 % — ABNORMAL LOW (ref 39.0–52.0)
Hemoglobin: 10.8 g/dL — ABNORMAL LOW (ref 13.0–17.0)
Immature Granulocytes: 0 %
Lymphocytes Relative: 14 %
Lymphs Abs: 0.9 K/uL (ref 0.7–4.0)
MCH: 36.2 pg — ABNORMAL HIGH (ref 26.0–34.0)
MCHC: 33.2 g/dL (ref 30.0–36.0)
MCV: 109.1 fL — ABNORMAL HIGH (ref 80.0–100.0)
Monocytes Absolute: 0.6 K/uL (ref 0.1–1.0)
Monocytes Relative: 10 %
Neutro Abs: 4.6 K/uL (ref 1.7–7.7)
Neutrophils Relative %: 74 %
Platelets: 146 K/uL — ABNORMAL LOW (ref 150–400)
RBC: 2.98 MIL/uL — ABNORMAL LOW (ref 4.22–5.81)
RDW: 15.6 % — ABNORMAL HIGH (ref 11.5–15.5)
WBC: 6.3 K/uL (ref 4.0–10.5)
nRBC: 0 % (ref 0.0–0.2)

## 2023-12-12 LAB — ECHOCARDIOGRAM COMPLETE
AR max vel: 2.03 cm2
AV Area VTI: 1.9 cm2
AV Area mean vel: 2.09 cm2
AV Mean grad: 4 mmHg
AV Peak grad: 8 mmHg
Ao pk vel: 1.41 m/s
Area-P 1/2: 3.54 cm2
Calc EF: 50 %
Height: 76 in
MV VTI: 2.03 cm2
S' Lateral: 3.5 cm
Single Plane A2C EF: 53.4 %
Single Plane A4C EF: 49.9 %
Weight: 3328 [oz_av]

## 2023-12-12 LAB — CBG MONITORING, ED: Glucose-Capillary: 167 mg/dL — ABNORMAL HIGH (ref 70–99)

## 2023-12-12 LAB — GLUCOSE, CAPILLARY
Glucose-Capillary: 192 mg/dL — ABNORMAL HIGH (ref 70–99)
Glucose-Capillary: 139 mg/dL — ABNORMAL HIGH (ref 70–99)

## 2023-12-12 LAB — APTT: aPTT: 41 s — ABNORMAL HIGH (ref 24–36)

## 2023-12-12 LAB — PROTIME-INR
INR: 1.2 (ref 0.8–1.2)
Prothrombin Time: 15.4 s — ABNORMAL HIGH (ref 11.4–15.2)

## 2023-12-12 LAB — ETHANOL: Alcohol, Ethyl (B): <15 mg/dL (ref <15)

## 2023-12-12 MED ORDER — STROKE: EARLY STAGES OF RECOVERY BOOK: Freq: Once | Status: AC

## 2023-12-12 MED ORDER — TAMSULOSIN HCL 0.4 MG PO CAPS
0.4000 mg | ORAL_CAPSULE | Freq: Every day | ORAL | Status: DC
  Administered 2023-12-12: 0.4 mg via ORAL
  Filled 2023-12-12: qty 1

## 2023-12-12 MED ORDER — ACETAMINOPHEN 650 MG RE SUPP: 650.0000 mg | RECTAL | Status: DC | PRN

## 2023-12-12 MED ORDER — ACETAMINOPHEN 325 MG PO TABS
650.0000 mg | ORAL_TABLET | ORAL | Status: DC | PRN
Start: 2023-12-12 — End: 2023-12-13

## 2023-12-12 MED ORDER — SENNOSIDES-DOCUSATE SODIUM 8.6-50 MG PO TABS: 1.0000 | ORAL_TABLET | Freq: Every evening | ORAL | Status: DC | PRN

## 2023-12-12 MED ORDER — ALBUTEROL SULFATE (2.5 MG/3ML) 0.083% IN NEBU: 2.5000 mg | INHALATION_SOLUTION | Freq: Four times a day (QID) | RESPIRATORY_TRACT | Status: DC | PRN

## 2023-12-12 MED ORDER — SODIUM CHLORIDE 0.9% FLUSH: 3.0000 mL | Freq: Once | INTRAVENOUS | Status: DC

## 2023-12-12 MED ORDER — ASPIRIN 81 MG PO TBEC: 81.0000 mg | DELAYED_RELEASE_TABLET | Freq: Every day | ORAL | Status: DC

## 2023-12-12 MED ORDER — INSULIN ASPART 100 UNIT/ML IJ SOLN: 0.0000 [IU] | Freq: Every day | INTRAMUSCULAR | Status: DC

## 2023-12-12 MED ORDER — SODIUM CHLORIDE 0.9 % IV SOLN: INTRAVENOUS | Status: DC

## 2023-12-12 MED ORDER — ACETAMINOPHEN 160 MG/5ML PO SOLN: 650.0000 mg | ORAL | Status: DC | PRN

## 2023-12-12 MED ORDER — INSULIN ASPART 100 UNIT/ML IJ SOLN
0.0000 [IU] | Freq: Three times a day (TID) | INTRAMUSCULAR | Status: DC
  Administered 2023-12-12: 2 [IU] via SUBCUTANEOUS
  Filled 2023-12-12: qty 1

## 2023-12-12 MED ORDER — LORAZEPAM 2 MG/ML IJ SOLN
1.0000 mg | Freq: Once | INTRAMUSCULAR | Status: AC
  Administered 2023-12-12: 1 mg via INTRAVENOUS
  Filled 2023-12-12: qty 1

## 2023-12-12 MED ORDER — ATORVASTATIN CALCIUM 20 MG PO TABS
80.0000 mg | ORAL_TABLET | Freq: Every day | ORAL | Status: DC
  Administered 2023-12-12 – 2023-12-13 (×2): 80 mg via ORAL
  Filled 2023-12-12 (×2): qty 4

## 2023-12-12 MED ORDER — APIXABAN 2.5 MG PO TABS
2.5000 mg | ORAL_TABLET | Freq: Two times a day (BID) | ORAL | Status: DC
Start: 2023-12-12 — End: 2023-12-13
  Administered 2023-12-12 – 2023-12-13 (×2): 2.5 mg via ORAL
  Filled 2023-12-12 (×2): qty 1

## 2023-12-12 MED ORDER — METOPROLOL TARTRATE 50 MG PO TABS
50.0000 mg | ORAL_TABLET | Freq: Two times a day (BID) | ORAL | Status: DC
  Administered 2023-12-12 – 2023-12-13 (×2): 50 mg via ORAL
  Filled 2023-12-12 (×2): qty 1

## 2023-12-12 MED ORDER — ENOXAPARIN SODIUM 40 MG/0.4ML IJ SOSY: 40.0000 mg | PREFILLED_SYRINGE | INTRAMUSCULAR | Status: DC

## 2023-12-12 NOTE — Progress Notes (Signed)
 PT Cancellation Note  Patient Details Name: MANOAH DECKARD MRN: [id number] DOB: November 29, 1940   Cancelled Treatment:    Reason Eval/Treat Not Completed: Patient at procedure or test/unavailable (Attempted PT evaluation but patient off floor getting MRI. Will re-attempt at later time/day as appropriate.)   Camie SAUNDERS. Juli, PT, DPT, Cert. MDT 12/12/23, 1:46 PM

## 2023-12-12 NOTE — ED Notes (Signed)
 Pt arrive in CT

## 2023-12-12 NOTE — ED Notes (Signed)
 US  at bedside.

## 2023-12-12 NOTE — ED Provider Notes (Addendum)
 [facility] Provider Note    Event Date/Time   First MD Initiated Contact with Patient 12/12/23 1031     (approximate)   History   Numbness   HPI  Keith Cruz is a 83 y.o. male past medical history significant for atrial fibrillation on Eliquis , prior TIA, hypertension, hyperlipidemia, GERD, history of carotid stenosis with right carotid endarterectomy at [facility] in 2021, presents to the emergency department with strokelike symptoms.  Patient's last known well around 8 AM this morning.  States that he woke up this morning and was sitting at breakfast when he had a sudden onset when he was going to stand up a feeling like his left leg from his hip down went numb and he was unable to stand secondary to weakness.  States that his numbness and weakness has improved since arriving to the emergency department.  Some mild numbness to his face and arm.  States that he has had a history of stroke in the past but he does not know his prior stroke symptoms and states that he does not have any ongoing deficits from stroke.  Patient was an activated code stroke from triage     Physical Exam   Triage Vital Signs: ED Triage Vitals  Encounter Vitals Group     BP 12/12/23 1026 (!) 127/55     Girls Systolic BP Percentile --      Girls Diastolic BP Percentile --      Boys Systolic BP Percentile --      Boys Diastolic BP Percentile --      Pulse Rate 12/12/23 1026 62     Resp 12/12/23 1026 20     Temp --      Temp src --      SpO2 12/12/23 1026 96 %     Weight 12/12/23 1026 208 lb (94.3 kg)     Height 12/12/23 1026 6' 4 (1.93 m)     Head Circumference --      Peak Flow --      Pain Score 12/12/23 1023 0     Pain Loc --      Pain Education --      Exclude from Growth Chart --     Most recent vital signs: Vitals:   12/12/23 1026  BP: (!) 127/55  Pulse: 62  Resp: 20  SpO2: 96%    Physical Exam Constitutional:      Appearance: He is well-developed.  HENT:      Head: Atraumatic.  Eyes:     Conjunctiva/sclera: Conjunctivae normal.  Cardiovascular:     Rate and Rhythm: Regular rhythm.  Pulmonary:     Effort: No respiratory distress.  Musculoskeletal:     Cervical back: Normal range of motion.  Skin:    General: Skin is warm.  Neurological:     Mental Status: He is alert. Mental status is at baseline.     GCS: GCS eye subscore is 4. GCS verbal subscore is 5. GCS motor subscore is 6.     Cranial Nerves: Facial asymmetry present.     Sensory: Sensory deficit present.     Motor: Motor function is intact.     Comments: Decreased sensation to the left face when compared to the right and decreased sensation to the left upper extremity when compared to the right but sensation is intact.  Endorses sensation that is intact and equal to bilateral lower extremities.  Mild facial asymmetry with facial droop to the  left side with forehead sparing.  No expressive aphasia.  Extraocular movements intact with no nystagmus.     IMPRESSION / MDM / ASSESSMENT AND PLAN / ED COURSE  I reviewed the triage vital signs and the nursing notes.  Patient presents to the emergency department for sudden onset of left-sided paresthesias and weakness.  Patient is on anticoagulation.  On arrival hemodynamically stable.  Glucose within normal limits.  Eating to get a temperature.  Activated code stroke from triage.  Immediately taken back to CT scanner for CT scan head noncontrasted.  Patient is not a candidate for TNK given that he is on anticoagulation.  Low suspicion for LVO or large vessel occlusion.  On chart review do see that he has a history of carotid endarterectomy in 2021  NIH of 2 on my exam  EKG  I, Clotilda Punter, the attending physician, personally viewed and interpreted this ECG.   Rate: Normal  Rhythm: Normal sinus  Axis: Normal  Intervals: Normal  ST&T Change: None  No tachycardic or bradycardic dysrhythmias while on cardiac  telemetry.  RADIOLOGY I independently reviewed imaging, my interpretation of imaging: CT scan of the head with no signs of intracranial hemorrhage.  Read as stable remote parietal lobe infarct.  LABS (all labs ordered are listed, but only abnormal results are displayed) Labs interpreted as -    Labs Reviewed  PROTIME-INR - Abnormal; Notable for the following components:      Result Value   Prothrombin Time 15.4 (*)    All other components within normal limits  APTT - Abnormal; Notable for the following components:   aPTT 41 (*)    All other components within normal limits  COMPREHENSIVE METABOLIC PANEL WITH GFR - Abnormal; Notable for the following components:   Glucose, Bld 122 (*)    Creatinine, Ser 1.33 (*)    Calcium  8.6 (*)    GFR, Estimated 53 (*)    All other components within normal limits  CBC WITH DIFFERENTIAL/PLATELET - Abnormal; Notable for the following components:   RBC 2.98 (*)    Hemoglobin 10.8 (*)    HCT 32.5 (*)    MCV 109.1 (*)    MCH 36.2 (*)    RDW 15.6 (*)    Platelets 146 (*)    All other components within normal limits  CBG MONITORING, ED - Abnormal; Notable for the following components:   Glucose-Capillary 167 (*)    All other components within normal limits  ETHANOL     MDM   Clinical Course as of 12/12/23 1214  Sat Dec 12, 2023  1042 Notified that the patient was an activated code stroke.  Patient's last known well around 8 AM this morning.  Sudden onset of left-sided paresthesias and weakness.  Patient is on Eliquis .  Not a TNK candidate secondary to anticoagulation use.  Activated code stroke for teleneurology evaluation.  I evaluated the patient while in CT scanner.  On CT scan of the head no obvious findings of intracranial hemorrhage.  NIH scale of 2 [SM]  1048 Patiently was immediately evaluated by neurology Dr. Matthews at bedside, significant improvement of his paresthesias and is now only having a mild left facial droop.  Recommended  admission to the hospitalist for stroke workup. [SM]    Clinical Course User Index [SM] Punter Clotilda, MD   Lab work with no leukocytosis.  Anemia but hemoglobin appears to be stable at baseline.  Platelets mildly low at 146 with a baseline of 160s.  Alcohol level negative.  Creatinine appears to be at his baseline.  No significant electrolyte abnormality.  Calcium  mildly low at 8.6.  Consulted hospitalist for admission for stroke workup  PROCEDURES:  Critical Care performed: yes  .Critical Care  Performed by: Suzanne Kirsch, MD Authorized by: Suzanne Kirsch, MD   Critical care provider statement:    Critical care time (minutes):  30   Critical care was time spent personally by me on the following activities:  Development of treatment plan with patient or surrogate, discussions with consultants, evaluation of patient's response to treatment, examination of patient, ordering and review of laboratory studies, ordering and review of radiographic studies, ordering and performing treatments and interventions, pulse oximetry, re-evaluation of patient's condition and review of old charts   Patient's presentation is most consistent with acute presentation with potential threat to life or bodily function.   MEDICATIONS ORDERED IN ED: Medications  sodium chloride  flush (NS) 0.9 % injection 3 mL (3 mLs Intravenous Not Given 12/12/23 1039)    FINAL CLINICAL IMPRESSION(S) / ED DIAGNOSES   Final diagnoses:  Stroke-like symptoms     Rx / DC Orders   ED Discharge Orders     None        Note:  This document was prepared using Dragon voice recognition software and may include unintentional dictation errors.   Suzanne Kirsch, MD 12/12/23 1049    Suzanne Kirsch, MD 12/12/23 8791    Suzanne Kirsch, MD 12/12/23 1214

## 2023-12-12 NOTE — ED Notes (Signed)
 ED Dr Suzanne at bedside/CT

## 2023-12-12 NOTE — Progress Notes (Signed)
 SLP Cancellation Note  Patient Details Name: KAMRAN COKER MRN: [id number] DOB: [birth year]   Cancelled treatment:       Reason Eval/Treat Not Completed: Other (comment) Orders received for a cognitive linguistic evaluation as a part of the stroke work up. Pt in ED (arriving at ~10:00 today). Neurology consult pending. Head CT negative for acute intracranial abnormality. MRI ordered. SLP to hold assessment until completion of MRI.   [place] Alexsis Branscom Clapp, MS, CCC-SLP Speech Language Pathologist Rehab Services; [facility] [phone] (ascom)    [place] J Clapp 12/12/2023, 1:25 PM

## 2023-12-12 NOTE — ED Notes (Signed)
 Neuro at bedside/CT

## 2023-12-12 NOTE — ED Notes (Signed)
 First nurse note: AEMS from home for dizziness and L facial and arm numbness, pt unsure if it started this AM or if he woke up with it. Pt woke up at 5am. Story keeps changing, either numbness started while eating breakfast at 8am or he woke up with it. Pt has no arm drift. Speech is clear.  Hx stroke, a fib.  131/51, 97% RA, 68 HR, 146 CBG, 18# L AC

## 2023-12-12 NOTE — ED Notes (Signed)
 Pt stating does take blood thinner, Eliquis , unsure for what reason. Pt stating no hx of blood clot, does have hx previous stroke but unsure what symptoms were.

## 2023-12-12 NOTE — ED Notes (Addendum)
 Pt wife reports pt walked to breakfast and reports feeling drunk, sat down to eat then stated couldn't stand up d/t left arm and left leg numbness and weakness. Was also reporting left facial numbness.  Wife states woke up at 0630 and was normal, able to dress self

## 2023-12-12 NOTE — Plan of Care (Signed)

## 2023-12-12 NOTE — Evaluation (Signed)
 Occupational Therapy Evaluation Patient Details Name: Keith Cruz MRN: [id number] DOB: [birth year] Today's Date: 12/12/2023   History of Present Illness   Pt is an 83 y.o. male who came into ED at [facility] with strokelike symptoms-sudden onset of numbness and weakness on L side including face, arm and leg. CT scan of the brain/neck negative as well as MRI brain. He is admitted for TIA work up. PMH of atrial fibrillation on Eliquis , prior TIAs, hypertension, hyperlipidemia, GERD, carotid artery stenosis s/p right carotid endarterectomy at [facility] in 2021, COPD not on home oxygen, GERD, HTN, HLD     Clinical Impressions Pt was seen for OT evaluation this date. PTA, he resides in a one level home with his wife. He typically ambulates without a device in the home and uses a SPC when going outdoors. Per wife, they have a RW that she will make him use when she thinks he is unsteady, but he is stubborn. They report 5-6 falls in the last 6 months. Pt stopped driving in February 7975. He is typically MOD I with ADL performance. Pt presents with mild deficits in balance, strength and STM affecting safe and optimal ADL completion. Pt is HOH making direction following difficult at times during session. BUE strength, coordination and sensation appear intact. No visual deficits noted. He demo independence with doffing/donning socks seated at EOB. STS from EOB without AD with SBA x2 trials. He ambulated ~25 ft pushing IV pole with LUE then converted to BUEs with no LOB and mild forward flexed posture. CGA was provided by therapist for safety as PT mentioned a posterior lean, however none noted during OT eval. Pt's wife confirming he is moving at his baseline. She does admit he is having some STM deficits as he did not remember visiting his grandson for his bday yesterday and he only recalled 2/3 words after a few minutes. Pt and wife feel he will be safe to DC home as he has necessary DME to promote his safety. Will follow  acutely to maximize his safety and IND to return home with wife, recommend OT follow up services on DC.       If plan is discharge home, recommend the following:   A little help with walking and/or transfers;A little help with bathing/dressing/bathroom;Assistance with cooking/housework;Assist for transportation;Help with stairs or ramp for entrance     Functional Status Assessment   Patient has had a recent decline in their functional status and demonstrates the ability to make significant improvements in function in a reasonable and predictable amount of time.     Equipment Recommendations   None recommended by OT     Recommendations for Other Services         Precautions/Restrictions   Precautions Precautions: Fall Recall of Precautions/Restrictions: Impaired Restrictions Weight Bearing Restrictions Per Provider Order: No     Mobility Bed Mobility                    Transfers   Equipment used: None Transfers: Sit to/from Stand             General transfer comment: SBA for STS from EOB x2 during session with bed at lowest height; ambulated ~25 ft pushing IV pole with slightly forward flexed posture-wife present and reports he is ambulating at his baseline      Balance Overall balance assessment: Needs assistance Sitting-balance support: No upper extremity supported, Feet supported Sitting balance-Leahy Scale: Normal     Standing balance support: Bilateral upper  extremity supported, During functional activity, Reliant on assistive device for balance Standing balance-Leahy Scale: Fair Standing balance comment: reliant on pushing IV pole with bilateral UE's, no LOB with CGA                           ADL either performed or assessed with clinical judgement   ADL Overall ADL's : Modified independent                                       General ADL Comments: able to doff/don sock seated EOB without difficulty      Vision Baseline Vision/History: 6 Macular Degeneration (in L eye) Patient Visual Report: No change from baseline       Perception         Praxis         Pertinent Vitals/Pain Pain Assessment Pain Assessment: No/denies pain     Extremity/Trunk Assessment Upper Extremity Assessment Upper Extremity Assessment: Overall WFL for tasks assessed;Right hand dominant LUE Deficits / Details: during OT assessment, strength, coordination and sensation all appeared intact   Lower Extremity Assessment Lower Extremity Assessment: Defer to PT evaluation LLE Deficits / Details: Assessment limited by pt's difficulty following commands but he reports feeling L weaker than R and has more difficulty moving it in the bed.       Communication Communication Communication: Impaired Factors Affecting Communication: Hearing impaired;Reduced clarity of speech   Cognition Arousal: Alert Behavior During Therapy: Impulsive Cognition: Cognition impaired, Difficult to assess Difficult to assess due to: Hard of hearing/deaf Orientation impairments: Time Awareness: Intellectual awareness impaired Memory impairment (select all impairments): Short-term memory Attention impairment (select first level of impairment): Selective attention Executive functioning impairment (select all impairments): Problem solving OT - Cognition Comments: able to state name, location, month and year; forgot he celebrated his grandson's birthday yesterday and only able to recall 2/3 words, e.g. sock blue and bed after ~3 mins between tasks                 Following commands: Impaired Following commands impaired: Follows one step commands with increased time     Cueing  General Comments   Cueing Techniques: Verbal cues;Gestural cues;Tactile cues;Visual cues      Exercises Other Exercises Other Exercises: Edu on role of OT in acute setting.   Shoulder Instructions      Home Living Family/patient expects to  be discharged to:: Private residence Living Arrangements: Spouse/significant other Available Help at Discharge: Family;Available 24 hours/day (nearly 24/7; wife and 60 month old puppy) Type of Home: House Home Access: Ramped entrance     Home Layout: One level     Bathroom Shower/Tub: Producer, television/film/video: Handicapped height Bathroom Accessibility: No   Home Equipment: Agricultural consultant (2 wheels);Shower seat;Cane - single point;Lift chair;Grab bars - toilet;Grab bars - tub/shower;BSC/3in1          Prior Functioning/Environment Prior Level of Function : Independent/Modified Independent;History of Falls (last six months)             Mobility Comments: Per wife, no AD in the home, [facility] when outdoors. Does have RW she states she makes him use if he is unsteady. Endorses 5-6 falls in the last 6 months. He gave up driving in February 7974. Wife present to confirm history. ADLs Comments: Pateint I with ADLs and IADLs except driving.  OT Problem List: Impaired balance (sitting and/or standing);Decreased cognition   OT Treatment/Interventions: Self-care/ADL training;Therapeutic exercise;Therapeutic activities;DME and/or AE instruction;Patient/family education;Balance training      OT Goals(Current goals can be found in the care plan section)   Acute Rehab OT Goals Patient Stated Goal: go home OT Goal Formulation: With patient/family Time For Goal Achievement: 12/26/23 Potential to Achieve Goals: Good ADL Goals Pt Will Perform Lower Body Bathing: with modified independence;sit to/from stand;sitting/lateral leans Pt Will Perform Lower Body Dressing: with modified independence;sitting/lateral leans;sit to/from stand Pt Will Transfer to Toilet: with modified independence;regular height toilet;ambulating   OT Frequency:  Min 2X/week    Co-evaluation              AM-PAC OT 6 Clicks Daily Activity     Outcome Measure Help from another person eating meals?:  None Help from another person taking care of personal grooming?: None Help from another person toileting, which includes using toliet, bedpan, or urinal?: A Little Help from another person bathing (including washing, rinsing, drying)?: A Little Help from another person to put on and taking off regular upper body clothing?: None Help from another person to put on and taking off regular lower body clothing?: A Little 6 Click Score: 21   End of Session Nurse Communication: Mobility status  Activity Tolerance: Patient tolerated treatment well Patient left: in bed;with call bell/phone within reach;with family/visitor present  OT Visit Diagnosis: Other abnormalities of gait and mobility (R26.89);Unsteadiness on feet (R26.81);Repeated falls (R29.6)                Time: 8357-8293 OT Time Calculation (min): 24 min Charges:  OT General Charges $OT Visit: 1 Visit OT Evaluation $OT Eval Moderate Complexity: 1 Mod OT Treatments $Self Care/Home Management : 8-22 mins Clessie Karras, OTR/L 12/12/23, 5:33 PM  Audia Amick E Alexandar Weisenberger 12/12/2023, 5:28 PM

## 2023-12-12 NOTE — Consult Note (Addendum)
 NEUROLOGY CONSULT NOTE   Date of service: December 12, 2023 Patient Name: Keith Cruz MRN:  [id number] DOB:  Apr 18, 1941 Chief Complaint: LLE weakness Requesting Provider: Roann Gouty, MD  History of Present Illness  Keith Cruz is a 83 y.o. male with hx of a fib on eliquis , prior TIAs, HTN, HL, carotid artery stenosis status post right carotid endarterectomy at [facility] in 2021, COPD who presented to [facility] with strokelike symptoms.  Last known well was 8 AM.  He woke up normal but when he was sitting at the breakfast table he had sudden onset of numbness and weakness down his left side.  He was unable to stand due to his left leg weakness.  On my examination all of his symptoms had resolved except a left facial droop.  NIH stroke scale was 1.  CT head showed no acute process on personal review.  TNK was not administered due to symptoms too mild to treat and patient was on Eliquis .  LKW: 0800 Modified rankin score: 0-Completely asymptomatic and back to baseline post- stroke IV Thrombolysis: no too mild to treat and patient is on eliquis  NIHSS = 1    ROS  Comprehensive ROS performed and pertinent positives documented in HPI   Past History   Past Medical History:  Diagnosis Date   Arthritis    back   Asthma    Atherosclerosis of aorta (HCC)    Atherosclerosis of aorta (HCC)    Bronchitis 05/2017   chronic   CAD (coronary artery disease)    Carotid artery disease (HCC)    Carotid stenosis    Carotid stenosis, bilateral    Chronic bronchitis (HCC)    Chronic cough    COPD (chronic obstructive pulmonary disease) (HCC)    Dysrhythmia    GERD (gastroesophageal reflux disease)    Heart murmur    HOH (hard of hearing)    hearing aids   Hypercholesteremia    Hypertension    Kidney disease    stent   Macular degeneration of left eye    Myocardial infarction (HCC) 2016   Pre-diabetes    Seasonal allergies    Seizure (HCC)    Seizures (HCC)    viral meningitis   Stroke [facility])     1991 and imaging 05/12/15 old strokes   TIA (transient ischemic attack) 1991   x2   Uses hearing aid    Viral meningitis 2017   Viral meningitis    Wears dentures    upper and lower    Past Surgical History:  Procedure Laterality Date   CAROTID ENDARTERECTOMY Left 2014   CAROTID ENDARTERECTOMY Right 1991   CAROTID PTA/STENT INTERVENTION Left 05/31/2020   Procedure: CAROTID PTA/STENT INTERVENTIO;  Surgeon: Marea Selinda RAMAN, MD;  Location: [facility] INVASIVE CV LAB;  Service: Cardiovascular;  Laterality: Left;   CATARACT EXTRACTION W/PHACO Left 06/24/2017   Procedure: CATARACT EXTRACTION PHACO AND INTRAOCULAR LENS PLACEMENT (IOC) LEFT BORDERLINE DIABETIC;  Surgeon: Mittie Gaskin, MD;  Location: [facility];  Service: Ophthalmology;  Laterality: Left;   CATARACT EXTRACTION W/PHACO Right 07/22/2017   Procedure: CATARACT EXTRACTION PHACO AND INTRAOCULAR LENS PLACEMENT (IOC) RIGHT BORDERLINE DIABETIC;  Surgeon: Mittie Gaskin, MD;  Location: [facility];  Service: Ophthalmology;  Laterality: Right;   CORONARY ARTERY BYPASS GRAFT     x5   ESOPHAGEAL DILATION  11/16/2023   Procedure: DILATION, ESOPHAGUS;  Surgeon: Jinny Carmine, MD;  Location: [facility] ENDOSCOPY;  Service: Endoscopy;;   ESOPHAGOGASTRODUODENOSCOPY N/A 11/16/2023   Procedure:  EGD (ESOPHAGOGASTRODUODENOSCOPY);  Surgeon: Jinny Carmine, MD;  Location: [facility] ENDOSCOPY;  Service: Endoscopy;  Laterality: N/A;   ESOPHAGOGASTRODUODENOSCOPY N/A 10/21/2023   Procedure: EGD (ESOPHAGOGASTRODUODENOSCOPY);  Surgeon: Jinny Carmine, MD;  Location: [facility] ENDOSCOPY;  Service: Endoscopy;  Laterality: N/A;   SKIN LESION EXCISION Left    left ear   URETERAL STENT PLACEMENT      Family History: Family History  Problem Relation Age of Onset   Stroke Mother    Heart attack Mother    Dementia Mother    Stroke Father    Stroke Sister    Alzheimer's disease Sister    Dementia Sister    Dementia Sister    Stroke Brother    Alzheimer's  disease Maternal Grandmother    Dementia Maternal Grandmother    Stroke Paternal Grandmother    Stroke Paternal Grandfather    Alzheimer's disease Paternal Grandfather    Cancer Neg Hx     Social History  reports that he quit smoking about 53 years ago. His smoking use included cigarettes. He started smoking about 63 years ago. He has a 20 pack-year smoking history. He has quit using smokeless tobacco. He reports that he does not drink alcohol and does not use drugs.  Allergies  Allergen Reactions   Fexofenadine -Pseudoephed Er Swelling    Tongue and face. Only with D product. Tolerates plain allegra  at home.  .    Nifedipine Swelling    Tongue and face     Pseudoephedrine Swelling   Anoro Ellipta  [Umeclidinium-Vilanterol]    Carvedilol  Other (See Comments)    Light headed, heart racing, increase in blood pressure    Doxycycline  Hyclate Other (See Comments)   Prednisone Other (See Comments)    Oral prednisone. Confusion. Mental status changes   Roflumilast Other (See Comments)    Medications   Current Facility-Administered Medications:    [START ON 12/13/2023]  stroke: early stages of recovery book, , Does not apply, Once, Paudel, Keshab, MD   0.9 %  sodium chloride  infusion, , Intravenous, Continuous, Paudel, Keshab, MD, Last Rate: 40 mL/hr at 12/12/23 1255, New Bag at 12/12/23 1255   acetaminophen  (TYLENOL ) tablet 650 mg, 650 mg, Oral, Q4H PRN **OR** acetaminophen  (TYLENOL ) 160 MG/5ML solution 650 mg, 650 mg, Per Tube, Q4H PRN **OR** acetaminophen  (TYLENOL ) suppository 650 mg, 650 mg, Rectal, Q4H PRN, Paudel, Keshab, MD   albuterol  (PROVENTIL ) (2.5 MG/3ML) 0.083% nebulizer solution 2.5 mg, 2.5 mg, Inhalation, Q6H PRN, Paudel, Keshab, MD   apixaban  (ELIQUIS ) tablet 2.5 mg, 2.5 mg, Oral, BID, Paudel, Keshab, MD   [START ON 12/13/2023] aspirin  EC tablet 81 mg, 81 mg, Oral, Daily, Paudel, Keshab, MD   atorvastatin  (LIPITOR ) tablet 80 mg, 80 mg, Oral, Daily, Paudel, Keshab, MD, 80 mg  at 12/12/23 1330   insulin  aspart (novoLOG ) injection 0-5 Units, 0-5 Units, Subcutaneous, QHS, Paudel, Keshab, MD   insulin  aspart (novoLOG ) injection 0-9 Units, 0-9 Units, Subcutaneous, TID WC, Paudel, Keshab, MD   ipratropium-albuterol  (DUONEB) 0.5-2.5 (3) MG/3ML nebulizer solution 3 mL, 3 mL, Nebulization, Once, Karamalegos, Alexander J, DO   metoprolol  tartrate (LOPRESSOR ) tablet 50 mg, 50 mg, Oral, BID, Paudel, Keshab, MD   senna-docusate (Senokot-S) tablet 1 tablet, 1 tablet, Oral, QHS PRN, Paudel, Keshab, MD   sodium chloride  flush (NS) 0.9 % injection 3 mL, 3 mL, Intravenous, Once, Mumma, Shannon, MD   tamsulosin  (FLOMAX ) capsule 0.4 mg, 0.4 mg, Oral, QHS, Paudel, Keshab, MD  Current Outpatient Medications:    acetaminophen  (TYLENOL ) 500 MG tablet, Take 500  mg by mouth every 6 (six) hours as needed., Disp: , Rfl:    albuterol  (PROAIR  HFA) 108 (90 Base) MCG/ACT inhaler, INHALE 1-2 PUFFS INTO THE LUNGS EVERY 4 (FOUR) HOURS AS NEEDED FOR WHEEZING OR SHORTNESS OF BREATH., Disp: 18 each, Rfl: 12   ARNUITY ELLIPTA  100 MCG/ACT AEPB, Inhale 1 puff into the lungs daily., Disp: , Rfl:    aspirin  EC 81 MG tablet, Take 81 mg by mouth daily., Disp: , Rfl:    atorvastatin  (LIPITOR ) 80 MG tablet, TAKE 1 TABLET BY MOUTH EVERY DAY, Disp: 90 tablet, Rfl: 3   Cholecalciferol  (VITAMIN D3) 2000 UNITS capsule, Take 2,000 Units by mouth daily. am, Disp: , Rfl:    ELIQUIS  2.5 MG TABS tablet, TAKE 1 TABLET BY MOUTH TWICE A DAY, Disp: 60 tablet, Rfl: 1   fexofenadine  (ALLEGRA ) 180 MG tablet, Take 1 tablet (180 mg total) by mouth daily as needed for allergies or rhinitis., Disp: 90 tablet, Rfl: 3   fluticasone  (FLONASE ) 50 MCG/ACT nasal spray, INSTILL 1 SPRAY INTO BOTH NOSTRILS DAILY, Disp: 16 mL, Rfl: 2   folic acid  (FOLVITE ) 1 MG tablet, Take 1 mg by mouth daily. am, Disp: , Rfl:    hydrALAZINE  (APRESOLINE ) 50 MG tablet, TAKE 1 TABLET BY MOUTH THREE TIMES A DAY, Disp: 270 tablet, Rfl: 3   metoprolol  tartrate  (LOPRESSOR ) 50 MG tablet, TAKE 1 TABLET (50 MG TOTAL) BY MOUTH 2 (TWO) TIMES DAILY. D/C COREG  PT PREFERS METOPROLOL , Disp: 180 tablet, Rfl: 3   Multiple Vitamins-Minerals (CENTRUM SILVER 50+MEN PO), Take by mouth., Disp: , Rfl:    ondansetron  (ZOFRAN -ODT) 4 MG disintegrating tablet, Take 1 tablet (4 mg total) by mouth every 8 (eight) hours as needed for nausea or vomiting., Disp: 20 tablet, Rfl: 0   tamsulosin  (FLOMAX ) 0.4 MG CAPS capsule, Take 0.4 mg by mouth daily., Disp: , Rfl:    triamcinolone  cream (KENALOG ) 0.1 %, APPLY TO AFFECTED AREA TWICE A DAY, Disp: 30 g, Rfl: 5   ACCU-CHEK GUIDE TEST test strip, TEST BLOOD GLUCOSE ONCE DAILY, Disp: 100 strip, Rfl: 12   blood glucose meter kit and supplies, Dispense based on patient and insurance preference. Use as directed to check blood glucose once daily., Disp: 1 each, Rfl: 0   famotidine  (PEPCID ) 40 MG tablet, Take 40 mg by mouth 2 (two) times daily., Disp: , Rfl:   Vitals   Vitals:   12-25-2023 1026  BP: (!) 127/55  Pulse: 62  Resp: 20  SpO2: 96%  Weight: 94.3 kg  Height: 6' 4 (1.93 m)    Body mass index is 25.32 kg/m.   Physical Exam   Gen: patient lying in bed, NAD CV: extremities appear well-perfused Resp: normal WOB  Neurologic exam MS: alert, oriented x4, follows commands Speech: no dysarthria, no aphasia CN: PERRL, VFF, EOMI, sensation intact, L facial droop, hearing intact to voice Motor: 5/5 strength throughout Sensory: SILT Reflexes: 2+ symm with toes down bilat Coordination: FNF intact bilat Gait: deferred   Labs/Imaging/Neurodiagnostic studies   CBC:  Recent Labs  Lab 12/25/2023 1049  WBC 6.3  NEUTROABS 4.6  HGB 10.8*  HCT 32.5*  MCV 109.1*  PLT 146*   Basic Metabolic Panel:  Lab Results  Component Value Date   NA 140 12-25-2023   K 3.9 December 25, 2023   CO2 25 December 25, 2023   GLUCOSE 122 (H) December 25, 2023   BUN 19 12-25-2023   CREATININE 1.33 (H) 2023/12/25   CALCIUM  8.6 (L) 12-25-2023   GFRNONAA 53 (L)  12/12/2023   GFRAA 55 (L) 06/29/2017   Lipid Panel:  Lab Results  Component Value Date   LDLCALC 37 11/12/2022   HgbA1c:  Lab Results  Component Value Date   HGBA1C 5.9 11/12/2022   Urine Drug Screen: No results found for: LABOPIA, COCAINSCRNUR, LABBENZ, AMPHETMU, THCU, LABBARB  Alcohol Level     Component Value Date/Time   ETH <15 12/12/2023 1049   INR  Lab Results  Component Value Date   INR 1.2 12/12/2023   APTT  Lab Results  Component Value Date   APTT 41 (H) 12/12/2023   AED levels: No results found for: PHENYTOIN, ZONISAMIDE, LAMOTRIGINE, LEVETIRACETA  CT Head without contrast(Personally reviewed): No acute process  ASSESSMENT   Keith Cruz is a 83 y.o. male with hx of a fib on eliquis , prior TIAs, HTN, HL, carotid artery stenosis status post right carotid endarterectomy at [facility] in 2021, COPD who presented to [facility] with L sided weakness, now improving but not resolved, c/f TIA vs small stroke.  RECOMMENDATIONS   - Admit for stroke workup - Permissive HTN x48 hrs from sx onset or until stroke ruled out by MRI goal BP <220/110. PRN labetalol  or hydralazine  if BP above these parameters. Avoid oral antihypertensives. - MRI brain wo contrast - CTA/MRA if not already obtained - TTE w/ bubble - Check A1c and LDL + add statin per guidelines - Hold eliquis  until MRI brain reviewed by neurology - q4 hr neuro checks - STAT head CT for any change in neuro exam - Tele - PT/OT/SLP - Stroke education - Amb referral to neurology upon discharge   ______________________________________________________________________    Signed, Elida CHRISTELLA Ross, MD [facility]  Addendum [phone]  Personal review of MRI brain negative for acute ischemia. OK to continue eliquis . No indication for antiplatelet; I will d/c aspirin .  Elida Ross, MD [facility] [phone]  If 7pm- 7am, please page neurology on call as listed in  [facility].

## 2023-12-12 NOTE — ED Notes (Signed)
Stroke cart at bedside  

## 2023-12-12 NOTE — H&P (Signed)
 History and Physical    [name] Cruz:[id number] DOB: [birth year] DOA: 12/12/2023  DOS: the patient was seen and examined on 12/12/2023  PCP: Gretel App, NP   Patient coming from: Home  I have personally briefly reviewed patient's old medical records in [facility]  Chief Complaint: Left-sided numbness and weakness  HPI: Keith Cruz is a pleasant 83 y.o. male with medical history significant for atrial fibrillation on Eliquis , prior TIAs, hypertension, hyperlipidemia, GERD, carotid artery stenosis s/p right carotid endarterectomy at [facility] in 2021, COPD not on home oxygen, GERD, HTN, HLD who came into ED at [facility] with strokelike symptoms.  Patient's last known normal was around 8 AM this morning.  He stated that he woke up this morning and was sitting at breakfast when he had a sudden onset of numbness and weakness on his hip down on the left side.  His numbness and weakness improved since arriving to the emergency department.  He complains some mild numbness to his left face and arm.  He stated that he had a history of stroke in the past but does not remember the exact date.  He denies any weakness from the previous stroke.  He denies any chest pain, cough, fever, shortness of breath, palpitations.  ED Course: Upon arrival to the ED, patient is found to to have CT scan of the head and CT neck without any new changes, basic metabolic panel fairly normal, normal white count and hemoglobin hematocrit stable, normal alcohol level.  Neurology was called they advised for stroke workup.  Hospitalist service was consulted for evaluation for admission for stroke workup. . Review of Systems:  ROS  All other systems negative except as noted in the HPI.  Past Medical History:  Diagnosis Date   Arthritis    back   Asthma    Atherosclerosis of aorta (HCC)    Atherosclerosis of aorta (HCC)    Bronchitis 05/2017   chronic   CAD (coronary artery disease)    Carotid artery disease (HCC)     Carotid stenosis    Carotid stenosis, bilateral    Chronic bronchitis (HCC)    Chronic cough    COPD (chronic obstructive pulmonary disease) (HCC)    Dysrhythmia    GERD (gastroesophageal reflux disease)    Heart murmur    HOH (hard of hearing)    hearing aids   Hypercholesteremia    Hypertension    Kidney disease    stent   Macular degeneration of left eye    Myocardial infarction (HCC) 2016   Pre-diabetes    Seasonal allergies    Seizure (HCC)    Seizures (HCC)    viral meningitis   Stroke [place])    1991 and imaging 05/12/15 old strokes   TIA (transient ischemic attack) 1991   x2   Uses hearing aid    Viral meningitis 2017   Viral meningitis    Wears dentures    upper and lower    Past Surgical History:  Procedure Laterality Date   CAROTID ENDARTERECTOMY Left 2014   CAROTID ENDARTERECTOMY Right 1991   CAROTID PTA/STENT INTERVENTION Left 05/31/2020   Procedure: CAROTID PTA/STENT INTERVENTIO;  Surgeon: Marea Selinda RAMAN, MD;  Location: [facility] INVASIVE CV LAB;  Service: Cardiovascular;  Laterality: Left;   CATARACT EXTRACTION W/PHACO Left 06/24/2017   Procedure: CATARACT EXTRACTION PHACO AND INTRAOCULAR LENS PLACEMENT (IOC) LEFT BORDERLINE DIABETIC;  Surgeon: Mittie Gaskin, MD;  Location: [facility]ERY [facility];  Service: Ophthalmology;  Laterality: Left;  CATARACT EXTRACTION W/PHACO Right 07/22/2017   Procedure: CATARACT EXTRACTION PHACO AND INTRAOCULAR LENS PLACEMENT (IOC) RIGHT BORDERLINE DIABETIC;  Surgeon: Mittie Gaskin, MD;  Location: [facility];  Service: Ophthalmology;  Laterality: Right;   CORONARY ARTERY BYPASS GRAFT     x5   ESOPHAGEAL DILATION  11/16/2023   Procedure: DILATION, ESOPHAGUS;  Surgeon: Jinny Carmine, MD;  Location: [facility] ENDOSCOPY;  Service: Endoscopy;;   ESOPHAGOGASTRODUODENOSCOPY N/A 11/16/2023   Procedure: EGD (ESOPHAGOGASTRODUODENOSCOPY);  Surgeon: Jinny Carmine, MD;  Location: [facility] ENDOSCOPY;  Service: Endoscopy;  Laterality: N/A;    ESOPHAGOGASTRODUODENOSCOPY N/A 10/21/2023   Procedure: EGD (ESOPHAGOGASTRODUODENOSCOPY);  Surgeon: Jinny Carmine, MD;  Location: [facility] END[facility]COPY;  Service: Endoscopy;  Laterality: N/A;   SKIN LESION EXCISION Left    left ear   URETERAL STENT PLACEMENT       reports that he quit smoking about 53 years ago. His smoking use included cigarettes. He started smoking about 63 years ago. He has a 20 pack-year smoking history. He has quit using smokeless tobacco. He reports that he does not drink alcohol and does not use drugs.  Allergies  Allergen Reactions   Fexofenadine -Pseudoephed Er Swelling    Tongue and face. Only with D product. Tolerates plain allegra  at home.  .    Nifedipine Swelling    Tongue and face     Pseudoephedrine Swelling   Anoro Ellipta  [Umeclidinium-Vilanterol]    Carvedilol  Other (See Comments)    Light headed, heart racing, increase in blood pressure    Doxycycline  Hyclate Other (See Comments)   Prednisone Other (See Comments)    Oral prednisone. Confusion. Mental status changes   Roflumilast Other (See Comments)    Family History  Problem Relation Age of Onset   Stroke Mother    Heart attack Mother    Dementia Mother    Stroke Father    Stroke Sister    Alzheimer's disease Sister    Dementia Sister    Dementia Sister    Stroke Brother    Alzheimer's disease Maternal Grandmother    Dementia Maternal Grandmother    Stroke Paternal Grandmother    Stroke Paternal Grandfather    Alzheimer's disease Paternal Grandfather    Cancer Neg Hx     Prior to Admission medications   Medication Sig Start Date End Date Taking? Authorizing Provider  ACCU-CHEK GUIDE TEST test strip TEST BLOOD GLUCOSE ONCE DAILY 10/20/23   Gretel App, NP  acetaminophen  (TYLENOL ) 500 MG tablet Take 500 mg by mouth every 6 (six) hours as needed.    [provider]  albuterol  (PROAIR  HFA) 108 (90 Base) MCG/ACT inhaler INHALE 1-2 PUFFS INTO THE LUNGS EVERY 4 (FOUR) HOURS AS NEEDED FOR  WHEEZING OR SHORTNESS OF BREATH. 11/22/21   McLean-Scocuzza, Randine SAILOR, MD  ARNUITY ELLIPTA  100 MCG/ACT AEPB Inhale 1 puff into the lungs daily. 10/08/22   [provider]  aspirin  EC 81 MG tablet Take 81 mg by mouth daily.    [provider]  atorvastatin  (LIPITOR ) 80 MG tablet TAKE 1 TABLET BY MOUTH EVERY DAY 10/20/23   Gretel App, NP  blood glucose meter kit and supplies Dispense based on patient and insurance preference. Use as directed to check blood glucose once daily. 10/14/22   Gretel App, NP  Cholecalciferol  (VITAMIN D3) 2000 UNITS capsule Take 2,000 Units by mouth daily. am    [provider]  ELIQUIS  2.5 MG TABS tablet TAKE 1 TABLET BY MOUTH TWICE A DAY 12/07/23   Marea Selinda RAMAN, MD  fexofenadine  (ALLEGRA ) 180 MG tablet Take 1 tablet (180 mg total) by mouth daily as needed for allergies or rhinitis. 11/22/21   McLean-Scocuzza, Randine SAILOR, MD  fluticasone  (FLONASE ) 50 MCG/ACT nasal spray INSTILL 1 SPRAY INTO BOTH NOSTRILS DAILY 10/05/23   Gretel App, NP  folic acid  (FOLVITE ) 1 MG tablet Take 1 mg by mouth daily. am    [provider]  hydrALAZINE  (APRESOLINE ) 50 MG tablet TAKE 1 TABLET BY MOUTH THREE TIMES A DAY 10/20/23   Gretel App, NP  metoprolol  tartrate (LOPRESSOR ) 50 MG tablet TAKE 1 TABLET (50 MG TOTAL) BY MOUTH 2 (TWO) TIMES DAILY. D/C COREG  PT PREFERS METOPROLOL  10/20/23   Gretel App, NP  Multiple Vitamins-Minerals (CENTRUM SILVER 50+MEN PO) Take by mouth.    [provider]  ondansetron  (ZOFRAN -ODT) 4 MG disintegrating tablet Take 1 tablet (4 mg total) by mouth every 8 (eight) hours as needed for nausea or vomiting. 06/24/23   Gretel App, NP  tamsulosin  (FLOMAX ) 0.4 MG CAPS capsule Take 0.4 mg by mouth daily. Patient not taking: Reported on 11/16/2023 12/17/20   [provider]  triamcinolone  cream (KENALOG ) 0.1 % APPLY TO AFFECTED AREA TWICE A DAY 02/13/23   Gretel App, NP    Physical Exam: Vitals:   12/12/23 1026  BP: (!)  127/55  Pulse: 62  Resp: 20  SpO2: 96%  Weight: 94.3 kg  Height: 6' 4 (1.93 m)    Physical Exam   Constitutional: Alert, awake, calm, comfortable HEENT: Neck supple Respiratory: Clear to auscultation B/L, no wheezing, no rales.  Cardiovascular: Regular rate and rhythm, no murmurs / rubs / gallops. No extremity edema. 2+ pedal pulses. No carotid bruits.  Abdomen: Soft, no tenderness, Bowel sounds positive.  Musculoskeletal: no clubbing / cyanosis. Good ROM, no contractures. Normal muscle tone.  Skin: no rashes, lesions, ulcers. Neurologic: CN 2-12 grossly intact. Sensation intact, No focal deficit identified Psychiatric: Alert and oriented x 3. Normal mood.    Labs on Admission: I have personally reviewed following labs and imaging studies  CBC: Recent Labs  Lab 12/12/23 1049  WBC 6.3  NEUTROABS 4.6  HGB 10.8*  HCT 32.5*  MCV 109.1*  PLT 146*   Basic Metabolic Panel: Recent Labs  Lab 12/12/23 1049  NA 140  K 3.9  CL 105  CO2 25  GLUCOSE 122*  BUN 19  CREATININE 1.33*  CALCIUM  8.6*   GFR: Estimated Creatinine Clearance: 51.7 mL/min (A) (by C-G formula based on SCr of 1.33 mg/dL (H)). Liver Function Tests: Recent Labs  Lab 12/12/23 1049  AST 29  ALT 21  ALKPHOS 47  BILITOT 1.0  PROT 7.0  ALBUMIN 3.5   No results for input(s): LIPASE, AMYLASE in the last 168 hours. No results for input(s): AMMONIA in the last 168 hours. Coagulation Profile: Recent Labs  Lab 12/12/23 1049  INR 1.2   Cardiac Enzymes: No results for input(s): CKTOTAL, CKMB, CKMBINDEX, TROPONINI, TROPONINIHS in the last 168 hours. BNP (last 3 results) No results for input(s): BNP in the last 8760 hours. HbA1C: No results for input(s): HGBA1C in the last 72 hours. CBG: Recent Labs  Lab 12/12/23 1026  GLUCAP 167*   Lipid Profile: No results for input(s): CHOL, HDL, LDLCALC, TRIG, CHOLHDL, LDLDIRECT in the last 72 hours. Thyroid  Function Tests: No  results for input(s): TSH, T4TOTAL, FREET4, T3FREE, THYROIDAB in the last 72 hours. Anemia Panel: No results for input(s): VITAMINB12, FOLATE, FERRITIN, TIBC, IRON, RETICCTPCT in the last 72 hours. Urine analysis:  Component Value Date/Time   COLORURINE YELLOW (A) 12/09/2022 1228   APPEARANCEUR CLEAR (A) 12/09/2022 1228   APPEARANCEUR Clear 06/28/2018 0758   LABSPEC 1.006 12/09/2022 1228   PHURINE 5.0 12/09/2022 1228   GLUCOSEU NEGATIVE 12/09/2022 1228   HGBUR NEGATIVE 12/09/2022 1228   BILIRUBINUR NEGATIVE 12/09/2022 1228   BILIRUBINUR Negative 06/28/2018 0758   KETONESUR NEGATIVE 12/09/2022 1228   PROTEINUR NEGATIVE 12/09/2022 1228   NITRITE NEGATIVE 12/09/2022 1228   LEUKOCYTESUR NEGATIVE 12/09/2022 1228    Radiological Exams on Admission: I have personally reviewed images CT HEAD CODE STROKE WO CONTRAST Result Date: 12/12/2023 EXAM: CT HEAD WITHOUT CONTRAST 12/12/2023 10:32:37 AM TECHNIQUE: CT of the head was performed without the administration of intravenous contrast. Automated exposure control, iterative reconstruction, and/or weight based adjustment of the mA/kV was utilized to reduce the radiation dose to as low as reasonably achievable. COMPARISON: 02/17/2023 CLINICAL HISTORY: Neuro deficit, acute, stroke suspected. Left sided weakness per patient. Stroke code. FINDINGS: BRAIN AND VENTRICLES: No acute hemorrhage. Gray-white differentiation is preserved. No hydrocephalus. No extra-axial collection. No mass effect or midline shift. A remote right parietal lobe infarct is stable. Periventricular and subcortical white matter hypoattenuation is mildly advanced for age, stable from the prior exam. ORBITS: No acute abnormality. SINUSES: Chronic mucosal thickening is present in the left maxillary sinus. Small fluid levels are present. SOFT TISSUES AND SKULL: No acute soft tissue abnormality. No skull fracture. Atherosclerotic calcifications are present in the  cavernous carotid arteries bilaterally and at the dural margin of both vertebral arteries. No hyperdense vessel is present. IMPRESSION: 1. No acute intracranial abnormality. 2. Stable remote right parietal lobe infarct. 3. Mildly advanced periventricular and subcortical white matter hypoattenuation, stable from the prior exam. 4. Atherosclerotic calcifications in the cavernous carotid arteries bilaterally and at the dural margin of both vertebral arteries. No hyperdense vessel is present. Electronically signed by: Lonni Necessary MD 12/12/2023 10:39 AM EDT RP Workstation: [id number]    EKG: My personal interpretation of EKG shows: Normal sinus rhythm with left bundle branch block which was present in the previous EKG as well.    Assessment/Plan Principal Problem:   Acute stroke due to ischemia [facility]) Active Problems:   GERD (gastroesophageal reflux disease)   Atrial fibrillation (HCC)   Coronary artery disease of native artery of native heart with stable angina pectoris (HCC)   Chronic kidney disease (CKD), stage III (moderate) (HCC)   Hyperlipidemia    Assessment and Plan: 83 year old male with history of previous TIA/stroke without any residual, history of carotid stenosis s/p endarterectomy, CAD, A-fib on Eliquis , HTN, HLD, GERD who presented to ED complaining of left-sided weakness numbness which was transient.  1.  Left-sided weakness/numbness - It appears to be transient and resolved. - CT scan of the brain is negative for acute pathology along with CT scan of the neck. - He will be placed in observation - He will be placed in telemetry per stroke TIA protocol. - Per neurology and ED physician that patient is not a candidate for TNK due to him being on Eliquis . - Patient is already on aspirin  Eliquis  and statin which will be continued.  2.  Atrial fibrillation - Heart rate has been stable - Continue Eliquis , metoprolol  at home dose. - Continue to monitor in telemetry  3.   HLD - Continue on statin Lipitor   4.  COPD - Continue nebulization as needed for shortness of breath    DVT prophylaxis: Eliquis  Code Status: DNR/DNI(Do NOT Intubate) Family Communication: Wife at  bedside  Disposition Plan: Home  Consults called: Neurology  Admission status: Observation, Telemetry bed   Nena Rebel, MD [facility] 12/12/2023, 12:46 PM

## 2023-12-12 NOTE — ED Triage Notes (Signed)
 First nurse note: AEMS from home for dizziness and L facial and arm numbness, pt unsure if it started this AM or if he woke up with it. Pt woke up at 5am. Story keeps changing, either numbness started while eating breakfast at 8am or he woke up with it. Pt has no arm drift. Speech is clear.  Hx stroke, a fib.  131/51, 97% RA, 68 HR, 146 CBG, 18# L AC    On phone with Dr Dicky, pt continues to say probably LKW was 8am while eating breakfast. Pt was also dizzy earlier. Code stroke activated.

## 2023-12-12 NOTE — Progress Notes (Signed)
*  PRELIMINARY RESULTS* Echocardiogram 2D Echocardiogram has been performed.  Keith Cruz BROCKS [name] 12/12/2023, 1:43 PM

## 2023-12-12 NOTE — Evaluation (Signed)
 Physical Therapy Evaluation Patient Details Name: Keith Cruz MRN: [id number] DOB: 1941/01/26 Today's Date: 12/12/2023  History of Present Illness  Keith Cruz is a pleasant 83 y.o. male with medical history significant for atrial fibrillation on Eliquis , prior TIAs, hypertension, hyperlipidemia, GERD, carotid artery stenosis s/p right carotid endarterectomy at [facility] in 2021, COPD not on home oxygen, GERD, HTN, HLD who came into ED at [facility] with strokelike symptoms. He had a sudden onset of numbness and weakness on his hip down on the left side.  His numbness and weakness improved since arriving to the emergency department. CT scan of the brain is negative for acute pathology along with CT scan of the neck. He is admitted for stroke work up.   Clinical Impression  Patient received reclining in bed eating with wife Keith Cruz at bedside. Patient agreeable to PT although he intermittently needs encouragement to participate as he has alternating opinions about whether it is disruptive to eating goals or not. History obtained from pt and wife Keith Cruz as pt has difficulty hearing questions and Keith Cruz answers quickly for him. Patient lives in a single story home with his wife Keith Cruz and their three month old puppy. At baseline he is I with ADLs and ambulates mod I with SPC (although he has a RW) in the home and community. He has had 5-6 falls in the last 6 months but does not know why. He sleeps in the bed, uses a walk in shower with a seat to bathe, and gave up driving in February 7975. He has a BSC, lift chair, and raised toilet, but cannot get the walker into the bathroom. Upon PT evaluation, patient has variable attention and appears to lack safety awareness. He was mod I for bed mobility, CGA for transfers, and CGA to min A for ambulation with RW. During standing mobility he fails to keep RW close to body and walks outside of RW without any awareness of how this may lead to falls. He ambulates with very short  shuffling gait despite cuing to improve step length. He complains that L LE feels weak, but this is not noticable per observation. He is easily distractable and shifts weight backwards without awareness he is losing his balance backwards (which required min A to prevent fall). Patient appears unaware of poor AD and body positioning putting him at risk for falls. He seems pre-occupied with finding his belt and repeatedly asks questions that have been answered. His wife Keith Cruz is present and states this seems baseline for patient. Patient appears to have experienced a decline in functional mobility/independence and is at risk for falls. At this time he needs constant supervision and some assistance with out of bed mobility. Patient would benefit from skilled physical therapy to address impairments and functional limitations (see PT Problem List below) to work towards stated goals and return to PLOF or maximal functional independence.     If plan is discharge home, recommend the following: A little help with walking and/or transfers;A little help with bathing/dressing/bathroom;Assistance with cooking/housework;Assist for transportation;Supervision due to cognitive status;Help with stairs or ramp for entrance   Can travel by private vehicle   Yes    Equipment Recommendations None recommended by PT  Recommendations for Other Services       Functional Status Assessment Patient has had a recent decline in their functional status and demonstrates the ability to make significant improvements in function in a reasonable and predictable amount of time.     Precautions / Restrictions Precautions  Precautions: Fall Recall of Precautions/Restrictions: Impaired      Mobility  Bed Mobility Overal bed mobility: Modified Independent             General bed mobility comments: supine <> sit mod I with difficulty getting L LE unstuck from the blankets    Transfers Overall transfer level: Needs  assistance Equipment used: Rolling walker (2 wheels) Transfers: Sit to/from Stand Sit to Stand: Contact guard assist           General transfer comment: Patient completes sit <> stand transfer at bedside with CGA and cuing for improved body/AD position. He leaves RW behind and pivots into bed with decreased eccentric control despite cuing for safer positioning and movement.    Ambulation/Gait Ambulation/Gait assistance: Min assist Gait Distance (Feet): 180 Feet Assistive device: Rolling walker (2 wheels) Gait Pattern/deviations: Shuffle, Trunk flexed, Decreased stride length Gait velocity: slow     General Gait Details: Patient ambulates around nursing station with CGA-MinA (to prevent fall backwards at start of ambulation when he was walking in the room). He fails to keep RW close to body and walks outside of RW without any awareness of how this is unsafe during turning. He needs help recognizing his room. He ambulates with very short shuffling gait despite cuing to improve step length. He complains that L LE feels weak, but this is not noticable per observation. He is easily distractable and shifts weight backwards without awareness he is losing his balance backwards.  Stairs            Wheelchair Mobility     Tilt Bed    Modified Rankin (Stroke Patients Only)       Balance Overall balance assessment: Needs assistance Sitting-balance support: No upper extremity supported, Feet supported Sitting balance-Leahy Scale: Good Sitting balance - Comments: steady sitting at edge of bed   Standing balance support: Bilateral upper extremity supported, During functional activity, Reliant on assistive device for balance Standing balance-Leahy Scale: Fair Standing balance comment: appears unaware of inappropriate backwards shift causing LOB backwards requiring min A from PT to prevent falls. Does not have large enough step length or quick enough LE movement to use stepping strategy  effectively.                             Pertinent Vitals/Pain Pain Assessment Pain Assessment: No/denies pain    Home Living Family/patient expects to be discharged to:: Private residence Living Arrangements: Spouse/significant other Available Help at Discharge: Family;Available 24 hours/day (nearly 24/7; wife and 13 month old puppy) Type of Home: House Home Access: Ramped entrance       Home Layout: One level Home Equipment: Agricultural consultant (2 wheels);Shower seat;Cane - single point;Lift chair;Grab bars - toilet;Grab bars - tub/shower;BSC/3in1      Prior Function Prior Level of Function : Independent/Modified Independent;History of Falls (last six months)             Mobility Comments: Patient ambulates with SPC at baseline but has had 5-6 falls in the last 6 months. He gave up driving in February 7974. Wife present to confirm history. ADLs Comments: Pateint I with ADLs and IADLs except driving.     Extremity/Trunk Assessment   Upper Extremity Assessment Upper Extremity Assessment: LUE deficits/detail;Overall WFL for tasks assessed LUE Deficits / Details: slightly weaker on push than R UE    Lower Extremity Assessment Lower Extremity Assessment: Generalized weakness;LLE deficits/detail LLE Deficits / Details: Assessment  limited by pt's difficulty following commands but he reports feeling L weaker than R and has more difficulty moving it in the bed.       Communication   Communication Communication: Impaired Factors Affecting Communication: Hearing impaired;Reduced clarity of speech    Cognition Arousal: Alert Behavior During Therapy: Impulsive   PT - Cognitive impairments: Awareness, Problem solving, Safety/Judgement, Memory, Attention                       PT - Cognition Comments: Patient appears unaware of poor AD and body positioning putting him at risk for falls. He seems pre-occupied with finding his belt and repeatedly asks questions  that have been answered. He voices conflicting concerns about his lunch at various times int he session. His wife Keith Cruz is present and states this seems baseline for pateint. Following commands: Impaired Following commands impaired: Follows one step commands inconsistently     Cueing Cueing Techniques: Verbal cues, Gestural cues, Tactile cues, Visual cues     General Comments      Exercises Other Exercises Other Exercises: pt and wife educated on role of PT in acute care setting. Pt educated on and practiced safe mobility techniques.   Assessment/Plan    PT Assessment Patient needs continued PT services  PT Problem List Decreased strength;Decreased mobility;Decreased safety awareness;Decreased activity tolerance;Decreased balance;Decreased knowledge of use of DME;Decreased range of motion;Decreased coordination;Decreased knowledge of precautions;Decreased cognition       PT Treatment Interventions DME instruction;Therapeutic exercise;Gait training;Balance training;Neuromuscular re-education;Functional mobility training;Cognitive remediation;Therapeutic activities;Patient/family education    PT Goals (Current goals can be found in the Care Plan section)  Acute Rehab PT Goals Patient Stated Goal: find his belt PT Goal Formulation: With patient Time For Goal Achievement: 12/12/23 Potential to Achieve Goals: Good    Frequency 7X/week     Co-evaluation               AM-PAC PT 6 Clicks Mobility  Outcome Measure Help needed turning from your back to your side while in a flat bed without using bedrails?: None Help needed moving from lying on your back to sitting on the side of a flat bed without using bedrails?: None Help needed moving to and from a bed to a chair (including a wheelchair)?: A Little Help needed standing up from a chair using your arms (e.g., wheelchair or bedside chair)?: A Little Help needed to walk in hospital room?: A Little Help needed climbing 3-5  steps with a railing? : A Little 6 Click Score: 20    End of Session Equipment Utilized During Treatment: Gait belt Activity Tolerance: Patient tolerated treatment well;Patient limited by fatigue Patient left: in bed;with family/visitor present;with call bell/phone within reach;with bed alarm set Nurse Communication: Mobility status PT Visit Diagnosis: History of falling (Z91.81);Muscle weakness (generalized) (M62.81);Other abnormalities of gait and mobility (R26.89);Unsteadiness on feet (R26.81);Hemiplegia and hemiparesis Hemiplegia - Right/Left: Left Hemiplegia - caused by: Unspecified    Time: 8467-8442 PT Time Calculation (min) (ACUTE ONLY): 25 min   Charges:   PT Evaluation $PT Eval Moderate Complexity: 1 Mod PT Treatments $Gait Training: 8-22 mins PT General Charges $$ ACUTE PT VISIT: 1 Visit         Camie SAUNDERS. Juli, PT, DPT, Cert. MDT 12/12/23, 4:36 PM

## 2023-12-13 DIAGNOSIS — I639 Cerebral infarction, unspecified: Secondary | ICD-10-CM | POA: Diagnosis not present

## 2023-12-13 LAB — LIPID PANEL
Cholesterol: 73 mg/dL (ref 0–200)
HDL: 30 mg/dL — ABNORMAL LOW (ref >40)
LDL Cholesterol: 34 mg/dL (ref 0–99)
Total CHOL/HDL Ratio: 2.4 ratio
Triglycerides: 44 mg/dL (ref <150)
VLDL: 9 mg/dL (ref 0–40)

## 2023-12-13 LAB — GLUCOSE, CAPILLARY: Glucose-Capillary: 118 mg/dL — ABNORMAL HIGH (ref 70–99)

## 2023-12-13 LAB — HEMOGLOBIN A1C
Hgb A1c MFr Bld: 6.1 % — ABNORMAL HIGH (ref 4.8–5.6)
Mean Plasma Glucose: 128 mg/dL

## 2023-12-13 NOTE — Progress Notes (Signed)
 Physical Therapy Treatment Patient Details Name: Keith Cruz MRN: [id number] DOB: 23-Apr-1941 Today's Date: 12/13/2023   History of Present Illness Pt is an 83 y.o. male who came into ED at [facility] with strokelike symptoms-sudden onset of numbness and weakness on L side including face, arm and leg. CT scan of the brain/neck negative as well as MRI brain. He is admitted for TIA work up. PMH of atrial fibrillation on Eliquis , prior TIAs, hypertension, hyperlipidemia, GERD, carotid artery stenosis s/p right carotid endarterectomy at [facility] in 2021, COPD not on home oxygen, GERD, HTN, HLD    PT Comments  Pt in chair, ready to go home.  Declining SNF.  He stated he uses a SPC at home on occasion and confirmed by wife in room.  He stands and walks 57' with SPC before reaching for IV pole for additional support.  C/O some general L hip pain with extended gait.  Stated he was falling frequently at home and got scared.  Pt does seem more comfortable and needs BUE support especially for longer distances.  He has RW at home and agrees to use it until strength increases and cleared by HHPT.  Wife verbalized understanding.  He does not want to go to SNF and opts to return home at this time.  Wife agrees and is encouraged to provide +1 assist at home upon discharge with mobility.      If plan is discharge home, recommend the following: A little help with walking and/or transfers;A little help with bathing/dressing/bathroom;Assistance with cooking/housework;Assist for transportation;Supervision due to cognitive status;Help with stairs or ramp for entrance   Can travel by private vehicle        Equipment Recommendations  None recommended by PT    Recommendations for Other Services       Precautions / Restrictions Precautions Precautions: Fall Recall of Precautions/Restrictions: Impaired Restrictions Weight Bearing Restrictions Per Provider Order: No     Mobility  Bed Mobility                General bed mobility comments: in chair before and after Patient Response: Cooperative  Transfers Overall transfer level: Needs assistance Equipment used: None Transfers: Sit to/from Stand Sit to Stand: Contact guard assist                Ambulation/Gait Ambulation/Gait assistance: Contact guard assist Gait Distance (Feet): 180 Feet Assistive device: Straight cane, IV Pole Gait Pattern/deviations: Shuffle, Trunk flexed, Decreased stride length Gait velocity: slow     General Gait Details: 80' with SPC only then reaches for IV pole too due to fatigue.   Stairs             Wheelchair Mobility     Tilt Bed Tilt Bed Patient Response: Cooperative  Modified Rankin (Stroke Patients Only)       Balance Overall balance assessment: Needs assistance Sitting-balance support: Feet supported Sitting balance-Leahy Scale: Normal     Standing balance support: Single extremity supported Standing balance-Leahy Scale: Fair                              Hotel manager: Impaired Factors Affecting Communication: Hearing impaired  Cognition Arousal: Alert Behavior During Therapy: WFL for tasks assessed/performed   PT - Cognitive impairments: History of cognitive impairments                       PT - Cognition Comments: likes to  joke so difficulty but does need some reenforcing of walker use at home. Following commands: Intact Following commands impaired: Only follows one step commands consistently    Cueing Cueing Techniques: Verbal cues, Visual cues  Exercises      General Comments        Pertinent Vitals/Pain Pain Assessment Pain Assessment: Faces Faces Pain Scale: Hurts little more Pain Location: L hip at end of gait Pain Descriptors / Indicators: Sore, Aching Pain Intervention(s): Limited activity within patient's tolerance, Monitored during session, Repositioned    Home Living                           Prior Function            PT Goals (current goals can now be found in the care plan section) Progress towards PT goals: Progressing toward goals    Frequency    7X/week      PT Plan      Co-evaluation              AM-PAC PT 6 Clicks Mobility   Outcome Measure  Help needed turning from your back to your side while in a flat bed without using bedrails?: None Help needed moving from lying on your back to sitting on the side of a flat bed without using bedrails?: None Help needed moving to and from a bed to a chair (including a wheelchair)?: A Little Help needed standing up from a chair using your arms (e.g., wheelchair or bedside chair)?: A Little Help needed to walk in hospital room?: A Little Help needed climbing 3-5 steps with a railing? : A Little 6 Click Score: 20    End of Session Equipment Utilized During Treatment: Gait belt Activity Tolerance: Patient tolerated treatment well;Patient limited by fatigue Patient left: in chair;with chair alarm set;with call bell/phone within reach;with family/visitor present Nurse Communication: Mobility status PT Visit Diagnosis: History of falling (Z91.81);Muscle weakness (generalized) (M62.81);Other abnormalities of gait and mobility (R26.89);Unsteadiness on feet (R26.81);Hemiplegia and hemiparesis Hemiplegia - Right/Left: Left     Time: 9094-9082 PT Time Calculation (min) (ACUTE ONLY): 12 min  Charges:    $Gait Training: 8-22 mins PT General Charges $$ ACUTE PT VISIT: 1 Visit                   Lauraine Gills, PTA 12/13/23, 9:31 AM

## 2023-12-13 NOTE — Discharge Instructions (Addendum)
 Please follow up with neurology to discuss your TIA and prevention.  You may continue your eliquis  and can stop your aspirin .  Please do not take your hydralazine  blood pressure medications until tomorrow as it is good to allow a higher blood pressure following a TIA for 48 hours.  If you see return of your symptoms or anything similar, please return to the ED  Home Health has been setup with [facility]. You will receive a call tomorrow to schedule an assessment.

## 2023-12-13 NOTE — Progress Notes (Incomplete)
 PROGRESS NOTE  Keith Cruz    DOB: [birth year], 83 y.o.  FMW:[id number]    Code Status: Full Code   DOA: 12/12/2023   LOS: 0   Brief hospital course  Keith Cruz is a 83 y.o. male with a PMH significant for ***.  They presented from *** to the ED on 12/12/2023 with *** x *** days. ***  In the ED, it was found that they had ***.  Significant findings included ***.  They were initially treated with ***.   Patient was admitted to medicine service for further workup and management of *** as outlined in detail below.  12/13/23 -***  Assessment & Plan  Principal Problem:   Acute stroke due to ischemia [facility]) Active Problems:   GERD (gastroesophageal reflux disease)   Atrial fibrillation (HCC)   Coronary artery disease of native artery of native heart with stable angina pectoris (HCC)   Chronic kidney disease (CKD), stage III (moderate) (HCC)   Hyperlipidemia  *** -   *** -   *** -   *** -   *** -   Body mass index is 25.32 kg/m.  VTE ppx: apixaban  (ELIQUIS ) tablet 2.5 mg Start: 12/12/23 2200 SCD's Start: 12/12/23 1217 apixaban  (ELIQUIS ) tablet 2.5 mg   Diet:     Diet   Diet regular Room service appropriate? Yes; Fluid consistency: Thin   Consultants: ***  Subjective 12/13/23    Pt reports ***   Objective  Blood pressure (!) 146/65, pulse 64, temperature (!) 97.3 F (36.3 C), resp. rate 18, height 6' 4 (1.93 m), weight 94.3 kg, SpO2 97%.  Intake/Output Summary (Last 24 hours) at 12/13/2023 9287 Last data filed at 12/13/2023 0430 Gross per 24 hour  Intake 392.67 ml  Output 600 ml  Net -207.33 ml   Filed Weights   12/12/23 1026  Weight: 94.3 kg     Physical Exam: *** General: awake, alert, NAD HEENT: atraumatic, clear conjunctiva, anicteric sclera, MMM, hearing grossly normal Respiratory: normal respiratory effort. Cardiovascular: quick capillary refill, normal S1/S2, RRR, no JVD, murmurs Gastrointestinal: soft, NT, ND Nervous: A&O x3. no  gross focal neurologic deficits, normal speech Extremities: moves all equally, no edema, normal tone Skin: dry, intact, normal temperature, normal color. No rashes, lesions or ulcers on exposed skin Psychiatry: normal mood, congruent affect  Labs   I have personally reviewed the following labs and imaging studies CBC    Component Value Date/Time   WBC 6.3 12/12/2023 1049   RBC 2.98 (L) 12/12/2023 1049   HGB 10.8 (L) 12/12/2023 1049   HGB 11.1 (L) 10/20/2023 1037   HGB 14.5 08/23/2015 0948   HCT 32.5 (L) 12/12/2023 1049   HCT 42.3 08/23/2015 0948   PLT 146 (L) 12/12/2023 1049   PLT 165 10/20/2023 1037   PLT 241 08/23/2015 0948   MCV 109.1 (H) 12/12/2023 1049   MCV 99 (H) 08/23/2015 0948   MCV 103 (H) 10/02/2013 0015   MCH 36.2 (H) 12/12/2023 1049   MCHC 33.2 12/12/2023 1049   RDW 15.6 (H) 12/12/2023 1049   RDW 13.5 08/23/2015 0948   RDW 12.9 10/02/2013 0015   LYMPHSABS 0.9 12/12/2023 1049   LYMPHSABS 2.0 08/23/2015 0948   LYMPHSABS 1.2 10/02/2013 0015   MONOABS 0.6 12/12/2023 1049   MONOABS 1.1 (H) 10/02/2013 0015   EOSABS 0.1 12/12/2023 1049   EOSABS 0.3 08/23/2015 0948   EOSABS 0.2 10/02/2013 0015   BASOSABS 0.0 12/12/2023 1049   BASOSABS 0.0 08/23/2015 0948  BASOSABS 0.1 10/02/2013 0015      Latest Ref Rng & Units 12/12/2023   10:49 AM 10/20/2023   10:37 AM 04/10/2023   10:27 AM  BMP  Glucose 70 - 99 mg/dL 877  882  871   BUN 8 - 23 mg/dL 19  21  20    Creatinine 0.61 - 1.24 mg/dL 8.66  8.55  8.75   Sodium 135 - 145 mmol/L 140  137  140   Potassium 3.5 - 5.1 mmol/L 3.9  4.3  4.3   Chloride 98 - 111 mmol/L 105  104  104   CO2 22 - 32 mmol/L 25  25  25    Calcium  8.9 - 10.3 mg/dL 8.6  8.8  9.1     ECHOCARDIOGRAM COMPLETE Result Date: 12/12/2023    ECHOCARDIOGRAM REPORT   Patient Name:   Keith Cruz Date of Exam: 12/12/2023 Medical Rec #:  [id number]      Height:       76.0 in Accession #:    [id number]     Weight:       208.0 lb Date of Birth:  March 26, 1941        BSA:          2.252 m Patient Age:    83 years       BP:           127/55 mmHg Patient Gender: M              HR:           57 bpm. Exam Location:  [facility] Procedure: 2D Echo, Cardiac Doppler and Color Doppler (Both Spectral and Color            Flow Doppler were utilized during procedure). Indications:     Stroke I63.9  History:         Patient has prior history of Echocardiogram examinations.  Sonographer:     Bari Roar Referring Phys:  [id number] NENA PAUDEL Diagnosing Phys: Redell Cave MD IMPRESSIONS  1. Left ventricular ejection fraction, by estimation, is 55 to 60%. The left ventricle has normal function. The left ventricle has no regional wall motion abnormalities. Left ventricular diastolic parameters are consistent with Grade I diastolic dysfunction (impaired relaxation).  2. Right ventricular systolic function is normal. The right ventricular size is normal.  3. The mitral valve is normal in structure. Trivial mitral valve regurgitation.  4. The aortic valve is tricuspid. Aortic valve regurgitation is not visualized.  5. The inferior vena cava is normal in size with greater than 50% respiratory variability, suggesting right atrial pressure of 3 mmHg. FINDINGS  Left Ventricle: Left ventricular ejection fraction, by estimation, is 55 to 60%. The left ventricle has normal function. The left ventricle has no regional wall motion abnormalities. The left ventricular internal cavity size was normal in size. There is  no left ventricular hypertrophy. Left ventricular diastolic parameters are consistent with Grade I diastolic dysfunction (impaired relaxation). Right Ventricle: The right ventricular size is normal. No increase in right ventricular wall thickness. Right ventricular systolic function is normal. Left Atrium: Left atrial size was normal in size. Right Atrium: Right atrial size was normal in size. Pericardium: There is no evidence of pericardial effusion. Mitral Valve: The mitral valve is normal in  structure. Trivial mitral valve regurgitation. MV peak gradient, 5.8 mmHg. The mean mitral valve gradient is 3.0 mmHg. Tricuspid Valve: The tricuspid valve is normal in structure. Tricuspid valve regurgitation is mild. Aortic Valve: The  aortic valve is tricuspid. Aortic valve regurgitation is not visualized. Aortic valve mean gradient measures 4.0 mmHg. Aortic valve peak gradient measures 8.0 mmHg. Aortic valve area, by VTI measures 1.90 cm. Pulmonic Valve: The pulmonic valve was normal in structure. Pulmonic valve regurgitation is mild. Aorta: The aortic root and ascending aorta are structurally normal, with no evidence of dilitation. Venous: The inferior vena cava is normal in size with greater than 50% respiratory variability, suggesting right atrial pressure of 3 mmHg. IAS/Shunts: No atrial level shunt detected by color flow Doppler.  LEFT VENTRICLE PLAX 2D LVIDd:         4.80 cm      Diastology LVIDs:         3.50 cm      LV e' medial:    5.44 cm/s LV PW:         0.90 cm      LV E/e' medial:  17.5 LV IVS:        1.20 cm      LV e' lateral:   9.36 cm/s LVOT diam:     2.00 cm      LV E/e' lateral: 10.1 LV SV:         64 LV SV Index:   29 LVOT Area:     3.14 cm  LV Volumes (MOD) LV vol d, MOD A2C: 139.0 ml LV vol d, MOD A4C: 108.0 ml LV vol s, MOD A2C: 64.8 ml LV vol s, MOD A4C: 54.1 ml LV SV MOD A2C:     74.2 ml LV SV MOD A4C:     108.0 ml LV SV MOD BP:      62.1 ml RIGHT VENTRICLE RV Basal diam:  3.70 cm RV Mid diam:    3.50 cm RV S prime:     12.60 cm/s TAPSE (M-mode): 1.6 cm LEFT ATRIUM           Index        RIGHT ATRIUM           Index LA diam:      4.30 cm 1.91 cm/m   RA Area:     17.80 cm LA Vol (A4C): 61.7 ml 27.39 ml/m  RA Volume:   44.50 ml  19.76 ml/m  AORTIC VALVE                    PULMONIC VALVE AV Area (Vmax):    2.03 cm     PV Vmax:          1.12 m/s AV Area (Vmean):   2.09 cm     PV Peak grad:     5.0 mmHg AV Area (VTI):     1.90 cm     PR End Diast Vel: 8.76 msec AV Vmax:            141.00 cm/s  RVOT Peak grad:   3 mmHg AV Vmean:          94.700 cm/s AV VTI:            0.339 m AV Peak Grad:      8.0 mmHg AV Mean Grad:      4.0 mmHg LVOT Vmax:         91.10 cm/s LVOT Vmean:        63.100 cm/s LVOT VTI:          0.205 m LVOT/AV VTI ratio: 0.60  AORTA Ao Root diam: 2.70 cm Ao Asc diam:  3.20  cm MITRAL VALVE                TRICUSPID VALVE MV Area (PHT): 3.54 cm     TR Peak grad:   31.8 mmHg MV Area VTI:   2.03 cm     TR Vmax:        282.00 cm/s MV Peak grad:  5.8 mmHg MV Mean grad:  3.0 mmHg     SHUNTS MV Vmax:       1.20 m/s     Systemic VTI:  0.20 m MV Vmean:      78.0 cm/s    Systemic Diam: 2.00 cm MV Decel Time: 214 msec MV E velocity: 95.00 cm/s MV A velocity: 121.00 cm/s MV E/A ratio:  0.79 MV A Prime:    10.8 cm/s Redell Cave MD Electronically signed by Redell Cave MD Signature Date/Time: 12/12/2023/3:36:23 PM    Final    MR BRAIN WO CONTRAST Result Date: 12/12/2023 CLINICAL DATA:  Neuro deficit, acute, stroke suspected. Acute left-sided numbness and weakness. EXAM: MRI HEAD WITHOUT CONTRAST TECHNIQUE: Multiplanar, multiecho pulse sequences of the brain and surrounding structures were obtained without intravenous contrast. COMPARISON:  Head CT 12/12/2023 and MRI 01/26/2023 FINDINGS: Brain: There is no evidence of an acute infarct, mass, midline shift, or extra-axial fluid collection. Several scattered chronic cerebral microhemorrhages are similar to the prior MRI. Multifocal small chronic right frontal and parietal cortical infarcts are unchanged, and there are also chronic bilateral thalamic lacunar infarcts. T2 hyperintensities in the cerebral white matter bilaterally are unchanged from the prior MRI and are nonspecific but compatible with mild chronic small vessel ischemic disease. There is moderate cerebral atrophy. Vascular: Major intracranial vascular flow voids are preserved. Skull and upper cervical spine: Unremarkable bone marrow signal. Sinuses/Orbits: Bilateral  cataract extraction. Mucosal thickening throughout the paranasal sinuses, most extensive in the left frontal and left maxillary sinuses. Left maxillary and left sphenoid sinus fluid. Chronic moderate bilateral mastoid and left middle ear effusions. Other: None. IMPRESSION: 1. No acute intracranial abnormality. 2. Chronic ischemia with multiple old infarcts as above. Electronically Signed   By: Dasie Hamburg M.D.   On: 12/12/2023 15:05   CT HEAD CODE STROKE WO CONTRAST Result Date: 12/12/2023 EXAM: CT HEAD WITHOUT CONTRAST 12/12/2023 10:32:37 AM TECHNIQUE: CT of the head was performed without the administration of intravenous contrast. Automated exposure control, iterative reconstruction, and/or weight based adjustment of the mA/kV was utilized to reduce the radiation dose to as low as reasonably achievable. COMPARISON: 02/17/2023 CLINICAL HISTORY: Neuro deficit, acute, stroke suspected. Left sided weakness per patient. Stroke code. FINDINGS: BRAIN AND VENTRICLES: No acute hemorrhage. Gray-white differentiation is preserved. No hydrocephalus. No extra-axial collection. No mass effect or midline shift. A remote right parietal lobe infarct is stable. Periventricular and subcortical white matter hypoattenuation is mildly advanced for age, stable from the prior exam. ORBITS: No acute abnormality. SINUSES: Chronic mucosal thickening is present in the left maxillary sinus. Small fluid levels are present. SOFT TISSUES AND SKULL: No acute soft tissue abnormality. No skull fracture. Atherosclerotic calcifications are present in the cavernous carotid arteries bilaterally and at the dural margin of both vertebral arteries. No hyperdense vessel is present. IMPRESSION: 1. No acute intracranial abnormality. 2. Stable remote right parietal lobe infarct. 3. Mildly advanced periventricular and subcortical white matter hypoattenuation, stable from the prior exam. 4. Atherosclerotic calcifications in the cavernous carotid arteries  bilaterally and at the dural margin of both vertebral arteries. No hyperdense vessel is present. Electronically signed by:  Lonni Necessary MD 12/12/2023 10:39 AM EDT RP Workstation: [id number]    Disposition Plan & Communication  Patient status: Observation  Admitted From: {From:23814} Planned disposition location: {PLAN; DISPOSITION:26386} Anticipated discharge date: *** pending ***  Family Communication: ***    Author: Marien LITTIE Piety, DO [facility] 12/13/2023, 7:12 AM   Available by Epic secure chat 7AM-7PM. If 7PM-7AM, please contact night-coverage.  [facility] contact information found on [email or web address].

## 2023-12-13 NOTE — TOC Initial Note (Addendum)
 Transition of Care [facility]) - Initial/Assessment Note    Patient Details  Name: Keith Cruz MRN: [id number] Date of Birth: April 19, 1941  Transition of [facility]) CM/SW Contact:    [place] L Emily Massar, LCSW Phone Number: 12/13/2023, 10:02 AM  Clinical Narrative:                  Brief assessment completed. Patient resides at home with his spouse. Patient has DME [id number] and a cane. Physical Therapy and Occupational therapy are not recommending DME this stay.   [facility] [place], [place].   Recommendations for [facility] discussed. Patient advised that he received [facility] services from an agency but could not recall the name. Family advised of no preference but they advised that they would like the services they had previously. Per [facility], patient had [facility] in 2024.   CSW contacted [place] and spoke to [place]. Darleene confirmed that he is able to accept patient and provided services.        Patient Goals and CMS Choice            Expected Discharge Plan and Services         Expected Discharge Date: 12/13/23                                    Prior Living Arrangements/Services                       Activities of Daily Living   ADL Screening (condition at time of admission) Independently performs ADLs?: Yes (appropriate for developmental age) Is the patient deaf or have difficulty hearing?: Yes Does the patient have difficulty seeing, even when wearing glasses/contacts?: Yes Does the patient have difficulty concentrating, remembering, or making decisions?: No  Permission Sought/Granted                  Emotional Assessment              Admission diagnosis:  Stroke-like symptoms [R29.90] Acute stroke due to ischemia [facility]) [I63.9] Patient Active Problem List   Diagnosis Date Noted   Acute stroke due to ischemia (HCC) 12/12/2023   Stricture of esophagus 11/16/2023   Dysphagia 10/21/2023   Stricture and stenosis of esophagus  10/21/2023   Nausea and vomiting 06/24/2023   Frequent falls 05/22/2023   Disorder of dental prosthesis 05/22/2023   Memory loss or impairment 05/15/2023   Encounter for removal of sutures 02/26/2023   Lip laceration 02/26/2023   Nondisplaced fracture of middle phalanx of left ring finger with routine healing 02/26/2023   Acute non-recurrent maxillary sinusitis 02/26/2023   Positive RPR test 01/22/2023   Spinal stenosis of lumbar region 01/01/2023   Generalized weakness 12/10/2022   Acute exacerbation of chronic obstructive pulmonary disease (COPD) (HCC) 12/10/2022   Near syncope 12/09/2022   MDS (myelodysplastic syndrome), low grade (HCC) 10/09/2022   Degeneration of lumbar intervertebral disc 07/10/2022   Symptomatic anemia 05/28/2022   Weight loss, abnormal 05/28/2022   CAP (community acquired pneumonia) 05/18/2022   Vitamin D  deficiency 05/13/2022   Contact dermatitis 05/13/2022   Low back pain 05/09/2022   Bilateral hearing loss 11/22/2021   Centrilobular emphysema (HCC) 05/22/2021   Adjustment reaction 04/12/2020   Benign hypertensive renal disease 04/12/2020   Disorder of kidney and ureter 04/12/2020   Other abnormal glucose 04/12/2020   Rash and other nonspecific skin eruption 04/12/2020  Chronic anticoagulation 01/24/2020   S/P carotid endarterectomy 01/24/2020   TIA (transient ischemic attack) 01/24/2020   Prediabetes 01/24/2020   Renovascular hypertension 09/02/2019   Hyperkalemia 01/18/2019   Essential hypertension 01/14/2019   Asbestos exposure 07/02/2018   Former smoker 05/13/2018   Abnormal findings on diagnostic imaging of lung 05/13/2018   Dizziness 05/11/2018   Elevated MCV 01/06/2017   Renal artery stenosis (HCC) 06/24/2016   Chronic bronchitis (HCC) 12/10/2015   Asthma, mild persistent 11/19/2015   ED (erectile dysfunction) 08/23/2015   GERD (gastroesophageal reflux disease) 01/22/2015   Insomnia 01/22/2015   Coronary artery disease of native artery  of native heart with stable angina pectoris (HCC) 07/13/2014   Chronic kidney disease (CKD), stage III (moderate) (HCC) 07/13/2014   Hardening of the aorta (main artery of the heart) (HCC) 07/13/2014   Bilateral carotid artery stenosis 07/13/2014   Atherosclerosis of aorta (HCC) 07/13/2014   Chronic coronary artery disease 07/13/2014   Hyperlipidemia 03/02/2014   Hyperlipidemia with target LDL less than 70 03/02/2014   Atrial fibrillation (HCC) 10/10/2013   S/P CABG x 5 10/07/2013   Presence of aortocoronary bypass graft 10/07/2013   PCP:  Gretel App, NP Pharmacy:   [phone] [place] [place] - [address] [place] [address] Phone: [phone] Fax: [phone]     Social Drivers of Health (SDOH) Social History: SDOH Screenings   Food Insecurity: No Food Insecurity (12/12/2023)  Housing: Low Risk  (12/12/2023)  Transportation Needs: No Transportation Needs (12/12/2023)  Utilities: Not At Risk (12/12/2023)  Alcohol Screen: Low Risk  (02/13/2023)  Depression (PHQ2-9): High Risk (05/15/2023)  Financial Resource Strain: Low Risk  (09/16/2023)   Received from [facility]  Physical Activity: Inactive (05/14/2023)  Social Connections: Moderately Isolated (12/12/2023)  Stress: Stress Concern Present (05/14/2023)  Tobacco Use: Medium Risk (12/12/2023)  Health Literacy: Inadequate Health Literacy (02/16/2023)   SDOH Interventions:     Readmission Risk Interventions     No data to display

## 2023-12-13 NOTE — Progress Notes (Signed)
 MD order received in [facility] to discharge pt home with home health today; TOC previously established Home Health services with [facility]; verbally reviewed AVS, no new Rxs; [facility] to contact the pt tomorrow to set up initial evaluation; pt to call Neurologist regarding follow up appointment in 3 weeks; no questions voiced at this time; pt discharged via wheelchair to the [facility] entrance

## 2023-12-13 NOTE — Plan of Care (Signed)
 Problem: Education: Goal: Ability to describe self-care measures that may prevent or decrease complications (Diabetes Survival Skills Education) will improve Outcome: Adequate for Discharge Goal: Individualized Educational Video(s) Outcome: Adequate for Discharge   Problem: Coping: Goal: Ability to adjust to condition or change in health will improve Outcome: Adequate for Discharge   Problem: Fluid Volume: Goal: Ability to maintain a balanced intake and output will improve Outcome: Adequate for Discharge   Problem: Health Behavior/Discharge Planning: Goal: Ability to identify and utilize available resources and services will improve Outcome: Adequate for Discharge Goal: Ability to manage health-related needs will improve Outcome: Adequate for Discharge   Problem: Metabolic: Goal: Ability to maintain appropriate glucose levels will improve Outcome: Adequate for Discharge   Problem: Nutritional: Goal: Maintenance of adequate nutrition will improve Outcome: Adequate for Discharge Goal: Progress toward achieving an optimal weight will improve Outcome: Adequate for Discharge   Problem: Skin Integrity: Goal: Risk for impaired skin integrity will decrease Outcome: Adequate for Discharge   Problem: Tissue Perfusion: Goal: Adequacy of tissue perfusion will improve Outcome: Adequate for Discharge   Problem: Education: Goal: Knowledge of disease or condition will improve Outcome: Adequate for Discharge Goal: Knowledge of secondary prevention will improve (MUST DOCUMENT ALL) Outcome: Adequate for Discharge Goal: Knowledge of patient specific risk factors will improve (DELETE if not current risk factor) Outcome: Adequate for Discharge   Problem: Ischemic Stroke/TIA Tissue Perfusion: Goal: Complications of ischemic stroke/TIA will be minimized Outcome: Adequate for Discharge   Problem: Coping: Goal: Will verbalize positive feelings about self Outcome: Adequate for  Discharge Goal: Will identify appropriate support needs Outcome: Adequate for Discharge   Problem: Health Behavior/Discharge Planning: Goal: Ability to manage health-related needs will improve Outcome: Adequate for Discharge Goal: Goals will be collaboratively established with patient/family Outcome: Adequate for Discharge   Problem: Self-Care: Goal: Ability to participate in self-care as condition permits will improve Outcome: Adequate for Discharge Goal: Verbalization of feelings and concerns over difficulty with self-care will improve Outcome: Adequate for Discharge Goal: Ability to communicate needs accurately will improve Outcome: Adequate for Discharge   Problem: Nutrition: Goal: Risk of aspiration will decrease Outcome: Adequate for Discharge Goal: Dietary intake will improve Outcome: Adequate for Discharge   Problem: Education: Goal: Knowledge of General Education information will improve Description: Including pain rating scale, medication(s)/side effects and non-pharmacologic comfort measures Outcome: Adequate for Discharge   Problem: Health Behavior/Discharge Planning: Goal: Ability to manage health-related needs will improve Outcome: Adequate for Discharge   Problem: Clinical Measurements: Goal: Ability to maintain clinical measurements within normal limits will improve Outcome: Adequate for Discharge Goal: Will remain free from infection Outcome: Adequate for Discharge Goal: Diagnostic test results will improve Outcome: Adequate for Discharge Goal: Respiratory complications will improve Outcome: Adequate for Discharge Goal: Cardiovascular complication will be avoided Outcome: Adequate for Discharge   Problem: Activity: Goal: Risk for activity intolerance will decrease Outcome: Adequate for Discharge   Problem: Nutrition: Goal: Adequate nutrition will be maintained Outcome: Adequate for Discharge   Problem: Coping: Goal: Level of anxiety will  decrease Outcome: Adequate for Discharge   Problem: Elimination: Goal: Will not experience complications related to bowel motility Outcome: Adequate for Discharge Goal: Will not experience complications related to urinary retention Outcome: Adequate for Discharge   Problem: Pain Managment: Goal: General experience of comfort will improve and/or be controlled Outcome: Adequate for Discharge   Problem: Safety: Goal: Ability to remain free from injury will improve Outcome: Adequate for Discharge   Problem: Skin Integrity: Goal: Risk for  impaired skin integrity will decrease Outcome: Adequate for Discharge   Problem: Acute Rehab PT Goals(only PT should resolve) Goal: Pt Will Transfer Bed To Chair/Chair To Bed Outcome: Adequate for Discharge Goal: Pt Will Ambulate Outcome: Adequate for Discharge Goal: Pt Will Verbalize and Adhere to Precautions While Description: PT Will Verbalize and Adhere to Precautions While Performing Mobility Outcome: Adequate for Discharge   Problem: Acute Rehab OT Goals (only OT should resolve) Goal: Pt. Will Perform Lower Body Bathing Outcome: Adequate for Discharge Goal: Pt. Will Perform Lower Body Dressing Outcome: Adequate for Discharge Goal: Pt. Will Transfer To Toilet Outcome: Adequate for Discharge

## 2023-12-13 NOTE — TOC Transition Note (Signed)
 Transition of Care [facility]) - Discharge Note   Patient Details  Name: Keith Cruz MRN: [id number] Date of Birth: 02/24/1941  Transition of Care [facility]) CM/SW Contact:  [place] L Willadean Guyton, LCSW Phone Number: 12/13/2023, 10:17 AM   Clinical Narrative:     Patient is medically ready for discharge. CSW discussed home health options with patient and his spouse. Home Health was setup with [facility] for PT/OT.   No other TOC needs identified. TOC signing off.   Final next level of care: Home w Home Health Services Barriers to Discharge: No Barriers Identified   Patient Goals and CMS Choice Patient states their goals for this hospitalization and ongoing recovery are:: Returning home CMS Medicare.gov Compare Post Acute Care list provided to:: Patient Choice offered to / list presented to : Patient [place] ownership interest in [facility].provided to:: Patient    Discharge Placement                  Name of family member notified: Everado Pillsbury Patient and family notified of of transfer: 12/13/23  Discharge Plan and Services Additional resources added to the After Visit Summary for                    DME Agency: NA       HH Arranged: PT, OT HH Agency: [facility] Date [facility] Agency Contacted: 12/13/23   Representative spoke with at [facility] Agency: Darleene  Social Drivers of Health (SDOH) Interventions SDOH Screenings   Food Insecurity: No Food Insecurity (12/12/2023)  Housing: Low Risk  (12/12/2023)  Transportation Needs: No Transportation Needs (12/12/2023)  Utilities: Not At Risk (12/12/2023)  Alcohol Screen: Low Risk  (02/13/2023)  Depression (PHQ2-9): High Risk (05/15/2023)  Financial Resource Strain: Low Risk  (09/16/2023)   Received from [facility]  Physical Activity: Inactive (05/14/2023)  Social Connections: Moderately Isolated (12/12/2023)  Stress: Stress Concern Present (05/14/2023)  Tobacco Use: Medium Risk (12/12/2023)  Health Literacy:  Inadequate Health Literacy (02/16/2023)     Readmission Risk Interventions     No data to display

## 2023-12-13 NOTE — Discharge Summary (Signed)
 Physician Discharge Summary  Patient: Keith Cruz FMW:[id number] DOB: 1940/05/13   Code Status: Full Code Admit date: 12/12/2023 Discharge date: 12/13/2023 Disposition: Home health, PT and OT PCP: Gretel App, NP  Recommendations for Outpatient Follow-up:  Follow up with PCP within 1-2 weeks Regarding general hospital follow up and preventative care Recommend stroke prevention measures Follow up with neurology regarding TIA follow up  Discharge Diagnoses:  Principal Problem:   Acute stroke due to ischemia [facility]) Active Problems:   GERD (gastroesophageal reflux disease)   Atrial fibrillation (HCC)   Coronary artery disease of native artery of native heart with stable angina pectoris (HCC)   Chronic kidney disease (CKD), stage III (moderate) (HCC)   Hyperlipidemia  Brief Hospital Course Summary: Keith Cruz is a pleasant 83 y.o. male with medical history significant for atrial fibrillation on Eliquis , prior TIAs, hypertension, hyperlipidemia, GERD, carotid artery stenosis s/p right carotid endarterectomy at [facility] in 2021, COPD not on home oxygen, GERD, HTN, HLD who came into ED at [facility] with strokelike symptoms.    ED Course: CT scan of the head and CT neck without any new changes, basic metabolic panel fairly normal, normal white count and hemoglobin hematocrit stable, normal alcohol level.  Neurology was consulted. Brain MRI was ordered which also was negative for acute stroke changes. Did reveal chronic ischemia with multiple old infarcts. He was continued on his home medications including eliquis  for his anticoagulation.  He was discharged in his baseline condition with resolution of his presenting symptoms to follow up with neurology outpatient for TIA.   All other chronic conditions were treated with home medications.    Discharge Condition: Good, improved Recommended discharge diet: Regular healthy diet  Consultations: Neurology   Procedures/Studies: Brain MRI  Discharge  Instructions     Ambulatory referral to Neurology   Complete by: As directed    An appointment is requested in approximately: 2 weeks      Allergies as of 12/13/2023       Reactions   Fexofenadine -pseudoephed Er Swelling   Tongue and face. Only with D product. Tolerates plain allegra  at home.  .    Nifedipine Swelling   Tongue and face   Pseudoephedrine Swelling   Anoro Ellipta  [umeclidinium-vilanterol]    Carvedilol  Other (See Comments)   Light headed, heart racing, increase in blood pressure    Doxycycline  Hyclate Other (See Comments)   Prednisone Other (See Comments)   Oral prednisone. Confusion. Mental status changes   Roflumilast Other (See Comments)        Medication List     PAUSE taking these medications    hydrALAZINE  50 MG tablet Wait to take this until: December 14, 2023 Commonly known as: APRESOLINE  TAKE 1 TABLET BY MOUTH THREE TIMES A DAY       STOP taking these medications    aspirin  EC 81 MG tablet       TAKE these medications    Accu-Chek Guide Test test strip Generic drug: glucose blood TEST BLOOD GLUCOSE ONCE DAILY   acetaminophen  500 MG tablet Commonly known as: TYLENOL  Take 500 mg by mouth every 6 (six) hours as needed.   albuterol  108 (90 Base) MCG/ACT inhaler Commonly known as: ProAir  HFA INHALE 1-2 PUFFS INTO THE LUNGS EVERY 4 (FOUR) HOURS AS NEEDED FOR WHEEZING OR SHORTNESS OF BREATH.   Arnuity Ellipta  100 MCG/ACT Aepb Generic drug: Fluticasone  Furoate Inhale 1 puff into the lungs daily.   atorvastatin  80 MG tablet Commonly known as: LIPITOR  TAKE  1 TABLET BY MOUTH EVERY DAY   blood glucose meter kit and supplies Dispense based on patient and insurance preference. Use as directed to check blood glucose once daily.   CENTRUM SILVER 50+MEN PO Take by mouth.   Eliquis  2.5 MG Tabs tablet Generic drug: apixaban  TAKE 1 TABLET BY MOUTH TWICE A DAY   fexofenadine  180 MG tablet Commonly known as: ALLEGRA  Take 1 tablet (180  mg total) by mouth daily as needed for allergies or rhinitis.   fluticasone  50 MCG/ACT nasal spray Commonly known as: FLONASE  INSTILL 1 SPRAY INTO BOTH NOSTRILS DAILY   folic acid  1 MG tablet Commonly known as: FOLVITE  Take 1 mg by mouth daily. am   metoprolol  tartrate 50 MG tablet Commonly known as: LOPRESSOR  TAKE 1 TABLET (50 MG TOTAL) BY MOUTH 2 (TWO) TIMES DAILY. D/C COREG  PT PREFERS METOPROLOL    ondansetron  4 MG disintegrating tablet Commonly known as: ZOFRAN -ODT Take 1 tablet (4 mg total) by mouth every 8 (eight) hours as needed for nausea or vomiting.   Pepcid  40 MG tablet Generic drug: famotidine  Take 40 mg by mouth 2 (two) times daily.   tamsulosin  0.4 MG Caps capsule Commonly known as: FLOMAX  Take 0.4 mg by mouth daily.   triamcinolone  cream 0.1 % Commonly known as: KENALOG  APPLY TO AFFECTED AREA TWICE A DAY   Vitamin D3 50 MCG (2000 UT) capsule Take 2,000 Units by mouth daily. am        Follow-up Information     [facility] Follow up in 3 week(s).   Contact information: [address] [place]  [address] [phone]                Subjective   Pt reports feeling well. He wants to go home ASAP. Denies any continued symptoms. He feels back to normal.   All questions and concerns were addressed at time of discharge.  Objective  Blood pressure (!) 164/72, pulse 62, temperature 98.7 F (37.1 C), resp. rate 18, height 6' 4 (1.93 m), weight 94.3 kg, SpO2 94%.   General: Pt is alert, awake, not in acute distress Cardiovascular: RRR, S1/S2 +, no rubs, no gallops Respiratory: CTA bilaterally, no wheezing, no rhonchi Abdominal: Soft, NT, ND, bowel sounds + Extremities: no edema, no cyanosis  The results of significant diagnostics from this hospitalization (including imaging, microbiology, ancillary and laboratory) are listed below for reference.   Imaging studies: ECHOCARDIOGRAM COMPLETE Result  Date: 12/12/2023    ECHOCARDIOGRAM REPORT   Patient Name:   Keith Cruz Date of Exam: 12/12/2023 Medical Rec #:  [id number]      Height:       76.0 in Accession #:    [id number]     Weight:       208.0 lb Date of Birth:  [birth year]       BSA:          2.252 m Patient Age:    83 years       BP:           127/55 mmHg Patient Gender: M              HR:           57 bpm. Exam Location:  [facility] Procedure: 2D Echo, Cardiac Doppler and Color Doppler (Both Spectral and Color            Flow Doppler were utilized during procedure). Indications:     Stroke I63.9  History:  Patient has prior history of Echocardiogram examinations.  Sonographer:     Bari Roar Referring Phys:  [id number] NENA PAUDEL Diagnosing Phys: Redell Cave MD IMPRESSIONS  1. Left ventricular ejection fraction, by estimation, is 55 to 60%. The left ventricle has normal function. The left ventricle has no regional wall motion abnormalities. Left ventricular diastolic parameters are consistent with Grade I diastolic dysfunction (impaired relaxation).  2. Right ventricular systolic function is normal. The right ventricular size is normal.  3. The mitral valve is normal in structure. Trivial mitral valve regurgitation.  4. The aortic valve is tricuspid. Aortic valve regurgitation is not visualized.  5. The inferior vena cava is normal in size with greater than 50% respiratory variability, suggesting right atrial pressure of 3 mmHg. FINDINGS  Left Ventricle: Left ventricular ejection fraction, by estimation, is 55 to 60%. The left ventricle has normal function. The left ventricle has no regional wall motion abnormalities. The left ventricular internal cavity size was normal in size. There is  no left ventricular hypertrophy. Left ventricular diastolic parameters are consistent with Grade I diastolic dysfunction (impaired relaxation). Right Ventricle: The right ventricular size is normal. No increase in right ventricular wall thickness. Right  ventricular systolic function is normal. Left Atrium: Left atrial size was normal in size. Right Atrium: Right atrial size was normal in size. Pericardium: There is no evidence of pericardial effusion. Mitral Valve: The mitral valve is normal in structure. Trivial mitral valve regurgitation. MV peak gradient, 5.8 mmHg. The mean mitral valve gradient is 3.0 mmHg. Tricuspid Valve: The tricuspid valve is normal in structure. Tricuspid valve regurgitation is mild. Aortic Valve: The aortic valve is tricuspid. Aortic valve regurgitation is not visualized. Aortic valve mean gradient measures 4.0 mmHg. Aortic valve peak gradient measures 8.0 mmHg. Aortic valve area, by VTI measures 1.90 cm. Pulmonic Valve: The pulmonic valve was normal in structure. Pulmonic valve regurgitation is mild. Aorta: The aortic root and ascending aorta are structurally normal, with no evidence of dilitation. Venous: The inferior vena cava is normal in size with greater than 50% respiratory variability, suggesting right atrial pressure of 3 mmHg. IAS/Shunts: No atrial level shunt detected by color flow Doppler.  LEFT VENTRICLE PLAX 2D LVIDd:         4.80 cm      Diastology LVIDs:         3.50 cm      LV e' medial:    5.44 cm/s LV PW:         0.90 cm      LV E/e' medial:  17.5 LV IVS:        1.20 cm      LV e' lateral:   9.36 cm/s LVOT diam:     2.00 cm      LV E/e' lateral: 10.1 LV SV:         64 LV SV Index:   29 LVOT Area:     3.14 cm  LV Volumes (MOD) LV vol d, MOD A2C: 139.0 ml LV vol d, MOD A4C: 108.0 ml LV vol s, MOD A2C: 64.8 ml LV vol s, MOD A4C: 54.1 ml LV SV MOD A2C:     74.2 ml LV SV MOD A4C:     108.0 ml LV SV MOD BP:      62.1 ml RIGHT VENTRICLE RV Basal diam:  3.70 cm RV Mid diam:    3.50 cm RV S prime:     12.60 cm/s TAPSE (M-mode): 1.6 cm LEFT ATRIUM  Index        RIGHT ATRIUM           Index LA diam:      4.30 cm 1.91 cm/m   RA Area:     17.80 cm LA Vol (A4C): 61.7 ml 27.39 ml/m  RA Volume:   44.50 ml  19.76 ml/m   AORTIC VALVE                    PULMONIC VALVE AV Area (Vmax):    2.03 cm     PV Vmax:          1.12 m/s AV Area (Vmean):   2.09 cm     PV Peak grad:     5.0 mmHg AV Area (VTI):     1.90 cm     PR End Diast Vel: 8.76 msec AV Vmax:           141.00 cm/s  RVOT Peak grad:   3 mmHg AV Vmean:          94.700 cm/s AV VTI:            0.339 m AV Peak Grad:      8.0 mmHg AV Mean Grad:      4.0 mmHg LVOT Vmax:         91.10 cm/s LVOT Vmean:        63.100 cm/s LVOT VTI:          0.205 m LVOT/AV VTI ratio: 0.60  AORTA Ao Root diam: 2.70 cm Ao Asc diam:  3.20 cm MITRAL VALVE                TRICUSPID VALVE MV Area (PHT): 3.54 cm     TR Peak grad:   31.8 mmHg MV Area VTI:   2.03 cm     TR Vmax:        282.00 cm/s MV Peak grad:  5.8 mmHg MV Mean grad:  3.0 mmHg     SHUNTS MV Vmax:       1.20 m/s     Systemic VTI:  0.20 m MV Vmean:      78.0 cm/s    Systemic Diam: 2.00 cm MV Decel Time: 214 msec MV E velocity: 95.00 cm/s MV A velocity: 121.00 cm/s MV E/A ratio:  0.79 MV A Prime:    10.8 cm/s Redell Cave MD Electronically signed by Redell Cave MD Signature Date/Time: 12/12/2023/3:36:23 PM    Final    MR BRAIN WO CONTRAST Result Date: 12/12/2023 CLINICAL DATA:  Neuro deficit, acute, stroke suspected. Acute left-sided numbness and weakness. EXAM: MRI HEAD WITHOUT CONTRAST TECHNIQUE: Multiplanar, multiecho pulse sequences of the brain and surrounding structures were obtained without intravenous contrast. COMPARISON:  Head CT 12/12/2023 and MRI 01/26/2023 FINDINGS: Brain: There is no evidence of an acute infarct, mass, midline shift, or extra-axial fluid collection. Several scattered chronic cerebral microhemorrhages are similar to the prior MRI. Multifocal small chronic right frontal and parietal cortical infarcts are unchanged, and there are also chronic bilateral thalamic lacunar infarcts. T2 hyperintensities in the cerebral white matter bilaterally are unchanged from the prior MRI and are nonspecific but compatible  with mild chronic small vessel ischemic disease. There is moderate cerebral atrophy. Vascular: Major intracranial vascular flow voids are preserved. Skull and upper cervical spine: Unremarkable bone marrow signal. Sinuses/Orbits: Bilateral cataract extraction. Mucosal thickening throughout the paranasal sinuses, most extensive in the left frontal and left maxillary sinuses. Left maxillary and left sphenoid sinus  fluid. Chronic moderate bilateral mastoid and left middle ear effusions. Other: None. IMPRESSION: 1. No acute intracranial abnormality. 2. Chronic ischemia with multiple old infarcts as above. Electronically Signed   By: Dasie Hamburg M.D.   On: 12/12/2023 15:05   CT HEAD CODE STROKE WO CONTRAST Result Date: 12/12/2023 EXAM: CT HEAD WITHOUT CONTRAST 12/12/2023 10:32:37 AM TECHNIQUE: CT of the head was performed without the administration of intravenous contrast. Automated exposure control, iterative reconstruction, and/or weight based adjustment of the mA/kV was utilized to reduce the radiation dose to as low as reasonably achievable. COMPARISON: 02/17/2023 CLINICAL HISTORY: Neuro deficit, acute, stroke suspected. Left sided weakness per patient. Stroke code. FINDINGS: BRAIN AND VENTRICLES: No acute hemorrhage. Gray-white differentiation is preserved. No hydrocephalus. No extra-axial collection. No mass effect or midline shift. A remote right parietal lobe infarct is stable. Periventricular and subcortical white matter hypoattenuation is mildly advanced for age, stable from the prior exam. ORBITS: No acute abnormality. SINUSES: Chronic mucosal thickening is present in the left maxillary sinus. Small fluid levels are present. SOFT TISSUES AND SKULL: No acute soft tissue abnormality. No skull fracture. Atherosclerotic calcifications are present in the cavernous carotid arteries bilaterally and at the dural margin of both vertebral arteries. No hyperdense vessel is present. IMPRESSION: 1. No acute  intracranial abnormality. 2. Stable remote right parietal lobe infarct. 3. Mildly advanced periventricular and subcortical white matter hypoattenuation, stable from the prior exam. 4. Atherosclerotic calcifications in the cavernous carotid arteries bilaterally and at the dural margin of both vertebral arteries. No hyperdense vessel is present. Electronically signed by: Lonni Necessary MD 12/12/2023 10:39 AM EDT RP Workstation: [id number]    Labs: Basic Metabolic Panel: Recent Labs  Lab 12/12/23 1049  NA 140  K 3.9  CL 105  CO2 25  GLUCOSE 122*  BUN 19  CREATININE 1.33*  CALCIUM  8.6*   CBC: Recent Labs  Lab 12/12/23 1049  WBC 6.3  NEUTROABS 4.6  HGB 10.8*  HCT 32.5*  MCV 109.1*  PLT 146*   Microbiology: Results for orders placed or performed during the hospital encounter of 12/09/22  SARS Coronavirus 2 by RT PCR (hospital order, performed in [facility] hospital lab) *cepheid single result test* Anterior Nasal Swab     Status: None   Collection Time: 12/09/22 12:28 PM   Specimen: Anterior Nasal Swab  Result Value Ref Range Status   SARS Coronavirus 2 by RT PCR NEGATIVE NEGATIVE Final    Comment: (NOTE) SARS-CoV-2 target nucleic acids are NOT DETECTED.  The SARS-CoV-2 RNA is generally detectable in upper and lower respiratory specimens during the acute phase of infection. The lowest concentration of SARS-CoV-2 viral copies this assay can detect is 250 copies / mL. A negative result does not preclude SARS-CoV-2 infection and should not be used as the sole basis for treatment or other patient management decisions.  A negative result may occur with improper specimen collection / handling, submission of specimen other than nasopharyngeal swab, presence of viral mutation(s) within the areas targeted by this assay, and inadequate number of viral copies (<250 copies / mL). A negative result must be combined with clinical observations, patient history, and epidemiological  information.  Fact Sheet for Patients:   RoadLapTop.co.za  Fact Sheet for Healthcare Providers: http://kim-miller.com/  This test is not yet approved or  cleared by the [place]  FDA and has been authorized for detection and/or diagnosis of SARS-CoV-2 by FDA under an Emergency Use Authorization (EUA).  This EUA will remain in effect (meaning this  test can be used) for the duration of the COVID-19 declaration under Section 564(b)(1) of the Act, 21 U.S.C. section 360bbb-3(b)(1), unless the authorization is terminated or revoked sooner.  Performed at [facility], [address], [place], [place] [address]     Time coordinating discharge: Over 30 minutes  Marien LITTIE Piety, MD  [facility] 12/13/2023, 8:59 AM

## 2023-12-14 ENCOUNTER — Telehealth: Payer: Self-pay

## 2023-12-14 NOTE — Telephone Encounter (Signed)
 Copied from CRM #[id number]. Topic: Referral - Status >> Dec 14, 2023  4:01 PM Frederich PARAS wrote: Reason for CRM: Jas from [facility] , she want to inform in delay in start of care, rescheduled to go out on 8/21 per wife Keith Cruz request,  callback # [phone] if have any questions

## 2023-12-17 DIAGNOSIS — Z7951 Long term (current) use of inhaled steroids: Secondary | ICD-10-CM | POA: Diagnosis not present

## 2023-12-17 DIAGNOSIS — Z951 Presence of aortocoronary bypass graft: Secondary | ICD-10-CM | POA: Diagnosis not present

## 2023-12-17 DIAGNOSIS — N183 Chronic kidney disease, stage 3 unspecified: Secondary | ICD-10-CM | POA: Diagnosis not present

## 2023-12-17 DIAGNOSIS — I700 Atherosclerosis of aorta: Secondary | ICD-10-CM | POA: Diagnosis not present

## 2023-12-17 DIAGNOSIS — Z79899 Other long term (current) drug therapy: Secondary | ICD-10-CM | POA: Diagnosis not present

## 2023-12-17 DIAGNOSIS — I4891 Unspecified atrial fibrillation: Secondary | ICD-10-CM | POA: Diagnosis not present

## 2023-12-17 DIAGNOSIS — J4489 Other specified chronic obstructive pulmonary disease: Secondary | ICD-10-CM | POA: Diagnosis not present

## 2023-12-17 DIAGNOSIS — Z87891 Personal history of nicotine dependence: Secondary | ICD-10-CM | POA: Diagnosis not present

## 2023-12-17 DIAGNOSIS — Z960 Presence of urogenital implants: Secondary | ICD-10-CM | POA: Diagnosis not present

## 2023-12-17 DIAGNOSIS — R7303 Prediabetes: Secondary | ICD-10-CM | POA: Diagnosis not present

## 2023-12-17 DIAGNOSIS — Z8673 Personal history of transient ischemic attack (TIA), and cerebral infarction without residual deficits: Secondary | ICD-10-CM | POA: Diagnosis not present

## 2023-12-17 DIAGNOSIS — K219 Gastro-esophageal reflux disease without esophagitis: Secondary | ICD-10-CM | POA: Diagnosis not present

## 2023-12-17 DIAGNOSIS — Z7901 Long term (current) use of anticoagulants: Secondary | ICD-10-CM | POA: Diagnosis not present

## 2023-12-17 DIAGNOSIS — Z8661 Personal history of infections of the central nervous system: Secondary | ICD-10-CM | POA: Diagnosis not present

## 2023-12-17 DIAGNOSIS — I25118 Atherosclerotic heart disease of native coronary artery with other forms of angina pectoris: Secondary | ICD-10-CM | POA: Diagnosis not present

## 2023-12-17 DIAGNOSIS — I129 Hypertensive chronic kidney disease with stage 1 through stage 4 chronic kidney disease, or unspecified chronic kidney disease: Secondary | ICD-10-CM | POA: Diagnosis not present

## 2023-12-17 DIAGNOSIS — I252 Old myocardial infarction: Secondary | ICD-10-CM | POA: Diagnosis not present

## 2023-12-17 DIAGNOSIS — E7800 Pure hypercholesterolemia, unspecified: Secondary | ICD-10-CM | POA: Diagnosis not present

## 2023-12-17 DIAGNOSIS — Z9181 History of falling: Secondary | ICD-10-CM | POA: Diagnosis not present

## 2023-12-18 DIAGNOSIS — I700 Atherosclerosis of aorta: Secondary | ICD-10-CM | POA: Diagnosis not present

## 2023-12-24 DIAGNOSIS — Z87891 Personal history of nicotine dependence: Secondary | ICD-10-CM | POA: Diagnosis not present

## 2023-12-24 DIAGNOSIS — I700 Atherosclerosis of aorta: Secondary | ICD-10-CM | POA: Diagnosis not present

## 2023-12-24 DIAGNOSIS — I4891 Unspecified atrial fibrillation: Secondary | ICD-10-CM | POA: Diagnosis not present

## 2023-12-24 DIAGNOSIS — R7303 Prediabetes: Secondary | ICD-10-CM | POA: Diagnosis not present

## 2023-12-24 DIAGNOSIS — Z9181 History of falling: Secondary | ICD-10-CM | POA: Diagnosis not present

## 2023-12-24 DIAGNOSIS — Z8673 Personal history of transient ischemic attack (TIA), and cerebral infarction without residual deficits: Secondary | ICD-10-CM | POA: Diagnosis not present

## 2023-12-24 DIAGNOSIS — Z960 Presence of urogenital implants: Secondary | ICD-10-CM | POA: Diagnosis not present

## 2023-12-24 DIAGNOSIS — K219 Gastro-esophageal reflux disease without esophagitis: Secondary | ICD-10-CM | POA: Diagnosis not present

## 2023-12-24 DIAGNOSIS — N183 Chronic kidney disease, stage 3 unspecified: Secondary | ICD-10-CM | POA: Diagnosis not present

## 2023-12-24 DIAGNOSIS — J4489 Other specified chronic obstructive pulmonary disease: Secondary | ICD-10-CM | POA: Diagnosis not present

## 2023-12-24 DIAGNOSIS — Z7951 Long term (current) use of inhaled steroids: Secondary | ICD-10-CM | POA: Diagnosis not present

## 2023-12-24 DIAGNOSIS — I252 Old myocardial infarction: Secondary | ICD-10-CM | POA: Diagnosis not present

## 2023-12-24 DIAGNOSIS — I129 Hypertensive chronic kidney disease with stage 1 through stage 4 chronic kidney disease, or unspecified chronic kidney disease: Secondary | ICD-10-CM | POA: Diagnosis not present

## 2023-12-24 DIAGNOSIS — Z8661 Personal history of infections of the central nervous system: Secondary | ICD-10-CM | POA: Diagnosis not present

## 2023-12-24 DIAGNOSIS — Z7901 Long term (current) use of anticoagulants: Secondary | ICD-10-CM | POA: Diagnosis not present

## 2023-12-24 DIAGNOSIS — Z951 Presence of aortocoronary bypass graft: Secondary | ICD-10-CM | POA: Diagnosis not present

## 2023-12-24 DIAGNOSIS — E7800 Pure hypercholesterolemia, unspecified: Secondary | ICD-10-CM | POA: Diagnosis not present

## 2023-12-24 DIAGNOSIS — Z79899 Other long term (current) drug therapy: Secondary | ICD-10-CM | POA: Diagnosis not present

## 2023-12-24 DIAGNOSIS — I25118 Atherosclerotic heart disease of native coronary artery with other forms of angina pectoris: Secondary | ICD-10-CM | POA: Diagnosis not present

## 2023-12-25 ENCOUNTER — Telehealth: Payer: Self-pay

## 2023-12-25 NOTE — Telephone Encounter (Signed)
 Home health forms placed in to be signed folder

## 2023-12-29 ENCOUNTER — Ambulatory Visit (INDEPENDENT_AMBULATORY_CARE_PROVIDER_SITE_OTHER): Admitting: Nurse Practitioner

## 2023-12-29 VITALS — BP 124/58 | HR 75 | Temp 97.6°F | Ht 76.0 in | Wt 207.0 lb

## 2023-12-29 DIAGNOSIS — Z960 Presence of urogenital implants: Secondary | ICD-10-CM | POA: Diagnosis not present

## 2023-12-29 DIAGNOSIS — I25118 Atherosclerotic heart disease of native coronary artery with other forms of angina pectoris: Secondary | ICD-10-CM | POA: Diagnosis not present

## 2023-12-29 DIAGNOSIS — G459 Transient cerebral ischemic attack, unspecified: Secondary | ICD-10-CM | POA: Diagnosis not present

## 2023-12-29 DIAGNOSIS — E7800 Pure hypercholesterolemia, unspecified: Secondary | ICD-10-CM | POA: Diagnosis not present

## 2023-12-29 DIAGNOSIS — R531 Weakness: Secondary | ICD-10-CM

## 2023-12-29 DIAGNOSIS — Z23 Encounter for immunization: Secondary | ICD-10-CM | POA: Diagnosis not present

## 2023-12-29 DIAGNOSIS — Z9181 History of falling: Secondary | ICD-10-CM | POA: Diagnosis not present

## 2023-12-29 DIAGNOSIS — Z7951 Long term (current) use of inhaled steroids: Secondary | ICD-10-CM | POA: Diagnosis not present

## 2023-12-29 DIAGNOSIS — Z8673 Personal history of transient ischemic attack (TIA), and cerebral infarction without residual deficits: Secondary | ICD-10-CM | POA: Diagnosis not present

## 2023-12-29 DIAGNOSIS — F39 Unspecified mood [affective] disorder: Secondary | ICD-10-CM | POA: Diagnosis not present

## 2023-12-29 DIAGNOSIS — Z8661 Personal history of infections of the central nervous system: Secondary | ICD-10-CM | POA: Diagnosis not present

## 2023-12-29 DIAGNOSIS — I252 Old myocardial infarction: Secondary | ICD-10-CM | POA: Diagnosis not present

## 2023-12-29 DIAGNOSIS — Z87891 Personal history of nicotine dependence: Secondary | ICD-10-CM | POA: Diagnosis not present

## 2023-12-29 DIAGNOSIS — I700 Atherosclerosis of aorta: Secondary | ICD-10-CM | POA: Diagnosis not present

## 2023-12-29 DIAGNOSIS — Z951 Presence of aortocoronary bypass graft: Secondary | ICD-10-CM | POA: Diagnosis not present

## 2023-12-29 DIAGNOSIS — Z79899 Other long term (current) drug therapy: Secondary | ICD-10-CM | POA: Diagnosis not present

## 2023-12-29 DIAGNOSIS — J4489 Other specified chronic obstructive pulmonary disease: Secondary | ICD-10-CM | POA: Diagnosis not present

## 2023-12-29 DIAGNOSIS — N183 Chronic kidney disease, stage 3 unspecified: Secondary | ICD-10-CM | POA: Diagnosis not present

## 2023-12-29 DIAGNOSIS — I129 Hypertensive chronic kidney disease with stage 1 through stage 4 chronic kidney disease, or unspecified chronic kidney disease: Secondary | ICD-10-CM | POA: Diagnosis not present

## 2023-12-29 DIAGNOSIS — Z7901 Long term (current) use of anticoagulants: Secondary | ICD-10-CM | POA: Diagnosis not present

## 2023-12-29 DIAGNOSIS — R7303 Prediabetes: Secondary | ICD-10-CM | POA: Diagnosis not present

## 2023-12-29 DIAGNOSIS — K219 Gastro-esophageal reflux disease without esophagitis: Secondary | ICD-10-CM | POA: Diagnosis not present

## 2023-12-29 DIAGNOSIS — I4891 Unspecified atrial fibrillation: Secondary | ICD-10-CM | POA: Diagnosis not present

## 2023-12-29 MED ORDER — SERTRALINE HCL 50 MG PO TABS: 50.0000 mg | ORAL_TABLET | Freq: Every day | ORAL | 0 refills | Status: DC

## 2023-12-29 MED ORDER — SERTRALINE HCL 50 MG PO TABS
50.0000 mg | ORAL_TABLET | Freq: Every day | ORAL | 0 refills | Status: DC
Start: 2023-12-29 — End: 2023-12-29

## 2023-12-29 NOTE — Progress Notes (Signed)
 [name], NP-C Phone: [phone]  Keith Cruz is a 83 y.o. male who presents today for hospital follow up.   Discussed the use of AI scribe software for clinical note transcription with the patient, who gave verbal consent to proceed.  History of Present Illness   Keith Cruz is an 83 year old male who presents for a hospital follow-up after a transient ischemic attack (TIA). He is accompanied by his wife, who assists with communication due to his hearing difficulties.  Approximately three weeks ago, he experienced a transient ischemic attack (TIA) around August 16th. Imaging studies did not reveal any new major strokes. His symptoms resolved, and he was discharged home without significant changes to his medication regimen, except for the discontinuation of aspirin . He has started physical therapy at home and is currently in his second week.  He experiences weakness in his legs, which is more pronounced than before the TIA. His gait and mobility have been affected. He continues to take Lipitor  (atorvastatin ) and Eliquis , with the latter reduced to 2.5 mg. He has not seen a neurologist since the event and is reluctant to do so.  He has significant hearing loss in his left ear, which affects his communication. He describes feeling 'wore out' and has a history of driving a truck for 57 years, which he attributes to his current state of fatigue. He acknowledges a lack of interest in activities and feeling irritable. He does not take any medication for mood and is resistant to starting any.  No issues with sleep, stating he sleeps a lot and has no difficulty falling asleep.      Social History   Tobacco Use  Smoking Status Former   Current packs/day: 0.00   Average packs/day: 2.0 packs/day for 10.0 years (20.0 ttl pk-yrs)   Types: Cigarettes   Start date: 04/28/1960   Quit date: 04/28/1970   Years since quitting: 53.7  Smokeless Tobacco Former    Current Outpatient Medications on  File Prior to Visit  Medication Sig Dispense Refill   ACCU-CHEK GUIDE TEST test strip TEST BLOOD GLUCOSE ONCE DAILY 100 strip 12   acetaminophen  (TYLENOL ) 500 MG tablet Take 500 mg by mouth every 6 (six) hours as needed.     albuterol  (PROAIR  HFA) 108 (90 Base) MCG/ACT inhaler INHALE 1-2 PUFFS INTO THE LUNGS EVERY 4 (FOUR) HOURS AS NEEDED FOR WHEEZING OR SHORTNESS OF BREATH. 18 each 12   ARNUITY ELLIPTA  100 MCG/ACT AEPB Inhale 1 puff into the lungs daily.     atorvastatin  (LIPITOR ) 80 MG tablet TAKE 1 TABLET BY MOUTH EVERY DAY 90 tablet 3   blood glucose meter kit and supplies Dispense based on patient and insurance preference. Use as directed to check blood glucose once daily. 1 each 0   Cholecalciferol  (VITAMIN D3) 2000 UNITS capsule Take 2,000 Units by mouth daily. am     ELIQUIS  2.5 MG TABS tablet TAKE 1 TABLET BY MOUTH TWICE A DAY 60 tablet 1   famotidine  (PEPCID ) 40 MG tablet Take 40 mg by mouth 2 (two) times daily.     fexofenadine  (ALLEGRA ) 180 MG tablet Take 1 tablet (180 mg total) by mouth daily as needed for allergies or rhinitis. 90 tablet 3   fluticasone  (FLONASE ) 50 MCG/ACT nasal spray INSTILL 1 SPRAY INTO BOTH NOSTRILS DAILY 16 mL 2   folic acid  (FOLVITE ) 1 MG tablet Take 1 mg by mouth daily. am     hydrALAZINE  (APRESOLINE ) 50 MG tablet TAKE 1 TABLET BY  MOUTH THREE TIMES A DAY 270 tablet 3   metoprolol  tartrate (LOPRESSOR ) 50 MG tablet TAKE 1 TABLET (50 MG TOTAL) BY MOUTH 2 (TWO) TIMES DAILY. D/C COREG  PT PREFERS METOPROLOL  180 tablet 3   Multiple Vitamins-Minerals (CENTRUM SILVER 50+MEN PO) Take by mouth.     ondansetron  (ZOFRAN -ODT) 4 MG disintegrating tablet Take 1 tablet (4 mg total) by mouth every 8 (eight) hours as needed for nausea or vomiting. 20 tablet 0   tamsulosin  (FLOMAX ) 0.4 MG CAPS capsule Take 0.4 mg by mouth daily.     triamcinolone  cream (KENALOG ) 0.1 % APPLY TO AFFECTED AREA TWICE A DAY 30 g 5   Current Facility-Administered Medications on File Prior to Visit   Medication Dose Route Frequency Provider Last Rate Last Admin   ipratropium-albuterol  (DUONEB) 0.5-2.5 (3) MG/3ML nebulizer solution 3 mL  3 mL Nebulization Once Edman Marsa PARAS, DO         ROS see history of present illness  Objective  Physical Exam Vitals:   12/29/23 1601  BP: (!) 124/58  Pulse: 75  Temp: 97.6 F (36.4 C)  SpO2: 94%    BP Readings from Last 3 Encounters:  12/29/23 (!) 124/58  12/13/23 (!) 164/72  11/16/23 (!) 168/73   Wt Readings from Last 3 Encounters:  12/29/23 207 lb (93.9 kg)  12/12/23 208 lb (94.3 kg)  11/16/23 218 lb (98.9 kg)    Physical Exam Constitutional:      General: He is not in acute distress.    Appearance: Normal appearance.  HENT:     Head: Normocephalic.  Cardiovascular:     Rate and Rhythm: Normal rate and regular rhythm.     Heart sounds: Normal heart sounds.  Pulmonary:     Effort: Pulmonary effort is normal.     Breath sounds: Normal breath sounds.  Skin:    General: Skin is warm and dry.  Neurological:     General: No focal deficit present.     Mental Status: He is alert.  Psychiatric:        Mood and Affect: Mood normal.        Behavior: Behavior normal.      Assessment/Plan: Please see individual problem list.  TIA (transient ischemic attack) Assessment & Plan: Imaging shows no new strokes and symptoms have resolved. He was discharged home and is on atorvastatin  and a reduced dose of Eliquis . He is undergoing home physical therapy and has declined neurology follow-up. Continue atorvastatin  and Eliquis  2.5 mg. Continue home physical therapy. Encourage neurology follow-up.   Mood disorder Assessment & Plan: He shows signs of depression, including lack of interest, irritability, and poor appetite. Although he denies depression, he feels worn out. He is resistant to medication. Education provided on SSRI drug class, benefits, and side effects discussed. Agreeable to start Zoloft  50 mg daily at bedtime.  Encouraged to contact if worsening symptoms, unusual behavior changes or suicidal thoughts occur. He will follow up in 6 weeks to assess effectiveness.    Orders: -     Sertraline  HCl; Take 1 tablet (50 mg total) by mouth at bedtime.  Dispense: 90 tablet; Refill: 0  Generalized weakness Assessment & Plan: He has mobility impairment in the left hip and overall weakness causing discomfort and is participating in home physical therapy. He is unable to walk, sit, stand, or lay comfortably. Continue home physical therapy.   Need for influenza vaccination -     Flu Vaccine Trivalent High Dose (Fluad)  Return in about 6 weeks (around 02/09/2024) for Anxiety/Depression.   [name], NP-C [facility] - [facility]

## 2023-12-30 NOTE — Telephone Encounter (Signed)
 Form faxed to [phone] with a completed transmission log

## 2024-01-06 DIAGNOSIS — I700 Atherosclerosis of aorta: Secondary | ICD-10-CM | POA: Diagnosis not present

## 2024-01-06 DIAGNOSIS — N183 Chronic kidney disease, stage 3 unspecified: Secondary | ICD-10-CM | POA: Diagnosis not present

## 2024-01-06 DIAGNOSIS — E7800 Pure hypercholesterolemia, unspecified: Secondary | ICD-10-CM | POA: Diagnosis not present

## 2024-01-06 DIAGNOSIS — I252 Old myocardial infarction: Secondary | ICD-10-CM | POA: Diagnosis not present

## 2024-01-06 DIAGNOSIS — I25118 Atherosclerotic heart disease of native coronary artery with other forms of angina pectoris: Secondary | ICD-10-CM | POA: Diagnosis not present

## 2024-01-06 DIAGNOSIS — I4891 Unspecified atrial fibrillation: Secondary | ICD-10-CM | POA: Diagnosis not present

## 2024-01-06 DIAGNOSIS — I129 Hypertensive chronic kidney disease with stage 1 through stage 4 chronic kidney disease, or unspecified chronic kidney disease: Secondary | ICD-10-CM | POA: Diagnosis not present

## 2024-01-06 DIAGNOSIS — Z87891 Personal history of nicotine dependence: Secondary | ICD-10-CM | POA: Diagnosis not present

## 2024-01-06 DIAGNOSIS — Z7951 Long term (current) use of inhaled steroids: Secondary | ICD-10-CM | POA: Diagnosis not present

## 2024-01-06 DIAGNOSIS — Z960 Presence of urogenital implants: Secondary | ICD-10-CM | POA: Diagnosis not present

## 2024-01-06 DIAGNOSIS — Z8673 Personal history of transient ischemic attack (TIA), and cerebral infarction without residual deficits: Secondary | ICD-10-CM | POA: Diagnosis not present

## 2024-01-06 DIAGNOSIS — Z951 Presence of aortocoronary bypass graft: Secondary | ICD-10-CM | POA: Diagnosis not present

## 2024-01-06 DIAGNOSIS — K219 Gastro-esophageal reflux disease without esophagitis: Secondary | ICD-10-CM | POA: Diagnosis not present

## 2024-01-06 DIAGNOSIS — Z7901 Long term (current) use of anticoagulants: Secondary | ICD-10-CM | POA: Diagnosis not present

## 2024-01-06 DIAGNOSIS — J4489 Other specified chronic obstructive pulmonary disease: Secondary | ICD-10-CM | POA: Diagnosis not present

## 2024-01-06 DIAGNOSIS — Z79899 Other long term (current) drug therapy: Secondary | ICD-10-CM | POA: Diagnosis not present

## 2024-01-06 DIAGNOSIS — Z9181 History of falling: Secondary | ICD-10-CM | POA: Diagnosis not present

## 2024-01-06 DIAGNOSIS — R7303 Prediabetes: Secondary | ICD-10-CM | POA: Diagnosis not present

## 2024-01-06 DIAGNOSIS — Z8661 Personal history of infections of the central nervous system: Secondary | ICD-10-CM | POA: Diagnosis not present

## 2024-01-13 DIAGNOSIS — R7303 Prediabetes: Secondary | ICD-10-CM | POA: Diagnosis not present

## 2024-01-13 DIAGNOSIS — K219 Gastro-esophageal reflux disease without esophagitis: Secondary | ICD-10-CM | POA: Diagnosis not present

## 2024-01-13 DIAGNOSIS — I252 Old myocardial infarction: Secondary | ICD-10-CM | POA: Diagnosis not present

## 2024-01-13 DIAGNOSIS — J4489 Other specified chronic obstructive pulmonary disease: Secondary | ICD-10-CM | POA: Diagnosis not present

## 2024-01-13 DIAGNOSIS — Z8673 Personal history of transient ischemic attack (TIA), and cerebral infarction without residual deficits: Secondary | ICD-10-CM | POA: Diagnosis not present

## 2024-01-13 DIAGNOSIS — Z960 Presence of urogenital implants: Secondary | ICD-10-CM | POA: Diagnosis not present

## 2024-01-13 DIAGNOSIS — Z951 Presence of aortocoronary bypass graft: Secondary | ICD-10-CM | POA: Diagnosis not present

## 2024-01-13 DIAGNOSIS — I4891 Unspecified atrial fibrillation: Secondary | ICD-10-CM | POA: Diagnosis not present

## 2024-01-13 DIAGNOSIS — Z9181 History of falling: Secondary | ICD-10-CM | POA: Diagnosis not present

## 2024-01-13 DIAGNOSIS — I700 Atherosclerosis of aorta: Secondary | ICD-10-CM | POA: Diagnosis not present

## 2024-01-13 DIAGNOSIS — Z79899 Other long term (current) drug therapy: Secondary | ICD-10-CM | POA: Diagnosis not present

## 2024-01-13 DIAGNOSIS — Z8661 Personal history of infections of the central nervous system: Secondary | ICD-10-CM | POA: Diagnosis not present

## 2024-01-13 DIAGNOSIS — N183 Chronic kidney disease, stage 3 unspecified: Secondary | ICD-10-CM | POA: Diagnosis not present

## 2024-01-13 DIAGNOSIS — Z7901 Long term (current) use of anticoagulants: Secondary | ICD-10-CM | POA: Diagnosis not present

## 2024-01-13 DIAGNOSIS — E7800 Pure hypercholesterolemia, unspecified: Secondary | ICD-10-CM | POA: Diagnosis not present

## 2024-01-13 DIAGNOSIS — I129 Hypertensive chronic kidney disease with stage 1 through stage 4 chronic kidney disease, or unspecified chronic kidney disease: Secondary | ICD-10-CM | POA: Diagnosis not present

## 2024-01-13 DIAGNOSIS — Z87891 Personal history of nicotine dependence: Secondary | ICD-10-CM | POA: Diagnosis not present

## 2024-01-13 DIAGNOSIS — Z7951 Long term (current) use of inhaled steroids: Secondary | ICD-10-CM | POA: Diagnosis not present

## 2024-01-13 DIAGNOSIS — I25118 Atherosclerotic heart disease of native coronary artery with other forms of angina pectoris: Secondary | ICD-10-CM | POA: Diagnosis not present

## 2024-01-20 DIAGNOSIS — Z960 Presence of urogenital implants: Secondary | ICD-10-CM | POA: Diagnosis not present

## 2024-01-20 DIAGNOSIS — I700 Atherosclerosis of aorta: Secondary | ICD-10-CM | POA: Diagnosis not present

## 2024-01-20 DIAGNOSIS — I129 Hypertensive chronic kidney disease with stage 1 through stage 4 chronic kidney disease, or unspecified chronic kidney disease: Secondary | ICD-10-CM | POA: Diagnosis not present

## 2024-01-20 DIAGNOSIS — I252 Old myocardial infarction: Secondary | ICD-10-CM | POA: Diagnosis not present

## 2024-01-20 DIAGNOSIS — Z87891 Personal history of nicotine dependence: Secondary | ICD-10-CM | POA: Diagnosis not present

## 2024-01-20 DIAGNOSIS — K219 Gastro-esophageal reflux disease without esophagitis: Secondary | ICD-10-CM | POA: Diagnosis not present

## 2024-01-20 DIAGNOSIS — Z8673 Personal history of transient ischemic attack (TIA), and cerebral infarction without residual deficits: Secondary | ICD-10-CM | POA: Diagnosis not present

## 2024-01-20 DIAGNOSIS — I25118 Atherosclerotic heart disease of native coronary artery with other forms of angina pectoris: Secondary | ICD-10-CM | POA: Diagnosis not present

## 2024-01-20 DIAGNOSIS — E7800 Pure hypercholesterolemia, unspecified: Secondary | ICD-10-CM | POA: Diagnosis not present

## 2024-01-20 DIAGNOSIS — Z9181 History of falling: Secondary | ICD-10-CM | POA: Diagnosis not present

## 2024-01-20 DIAGNOSIS — J4489 Other specified chronic obstructive pulmonary disease: Secondary | ICD-10-CM | POA: Diagnosis not present

## 2024-01-20 DIAGNOSIS — Z7951 Long term (current) use of inhaled steroids: Secondary | ICD-10-CM | POA: Diagnosis not present

## 2024-01-20 DIAGNOSIS — Z8661 Personal history of infections of the central nervous system: Secondary | ICD-10-CM | POA: Diagnosis not present

## 2024-01-20 DIAGNOSIS — N183 Chronic kidney disease, stage 3 unspecified: Secondary | ICD-10-CM | POA: Diagnosis not present

## 2024-01-20 DIAGNOSIS — R7303 Prediabetes: Secondary | ICD-10-CM | POA: Diagnosis not present

## 2024-01-20 DIAGNOSIS — Z7901 Long term (current) use of anticoagulants: Secondary | ICD-10-CM | POA: Diagnosis not present

## 2024-01-20 DIAGNOSIS — Z79899 Other long term (current) drug therapy: Secondary | ICD-10-CM | POA: Diagnosis not present

## 2024-01-20 DIAGNOSIS — I4891 Unspecified atrial fibrillation: Secondary | ICD-10-CM | POA: Diagnosis not present

## 2024-01-20 DIAGNOSIS — Z951 Presence of aortocoronary bypass graft: Secondary | ICD-10-CM | POA: Diagnosis not present

## 2024-01-21 ENCOUNTER — Telehealth: Payer: Self-pay

## 2024-01-21 ENCOUNTER — Encounter: Payer: Self-pay | Admitting: Nurse Practitioner

## 2024-01-21 DIAGNOSIS — F39 Unspecified mood [affective] disorder: Secondary | ICD-10-CM | POA: Insufficient documentation

## 2024-01-21 NOTE — Assessment & Plan Note (Signed)
 He has mobility impairment in the left hip and overall weakness causing discomfort and is participating in home physical therapy. He is unable to walk, sit, stand, or lay comfortably. Continue home physical therapy.

## 2024-01-21 NOTE — Telephone Encounter (Signed)
 [facility] orders placed in provider to be signed folder

## 2024-01-21 NOTE — Assessment & Plan Note (Signed)
 Imaging shows no new strokes and symptoms have resolved. He was discharged home and is on atorvastatin  and a reduced dose of Eliquis . He is undergoing home physical therapy and has declined neurology follow-up. Continue atorvastatin  and Eliquis  2.5 mg. Continue home physical therapy. Encourage neurology follow-up.

## 2024-01-21 NOTE — Assessment & Plan Note (Signed)
 He shows signs of depression, including lack of interest, irritability, and poor appetite. Although he denies depression, he feels worn out. He is resistant to medication. Education provided on SSRI drug class, benefits, and side effects discussed. Agreeable to start Zoloft  50 mg daily at bedtime. Encouraged to contact if worsening symptoms, unusual behavior changes or suicidal thoughts occur. He will follow up in 6 weeks to assess effectiveness.

## 2024-01-22 NOTE — Addendum Note (Signed)
 Addended by: ORLANDO KINGDOM on: 01/22/2024 08:43 AM   Modules accepted: Orders

## 2024-01-26 NOTE — Telephone Encounter (Signed)
 Form faxed to [phone] with a completed transmission log

## 2024-01-29 DIAGNOSIS — Z951 Presence of aortocoronary bypass graft: Secondary | ICD-10-CM | POA: Diagnosis not present

## 2024-01-29 DIAGNOSIS — I2510 Atherosclerotic heart disease of native coronary artery without angina pectoris: Secondary | ICD-10-CM | POA: Diagnosis not present

## 2024-01-29 DIAGNOSIS — R42 Dizziness and giddiness: Secondary | ICD-10-CM | POA: Diagnosis not present

## 2024-01-29 DIAGNOSIS — Z8679 Personal history of other diseases of the circulatory system: Secondary | ICD-10-CM | POA: Diagnosis not present

## 2024-01-29 DIAGNOSIS — E785 Hyperlipidemia, unspecified: Secondary | ICD-10-CM | POA: Diagnosis not present

## 2024-01-29 DIAGNOSIS — J439 Emphysema, unspecified: Secondary | ICD-10-CM | POA: Diagnosis not present

## 2024-01-29 DIAGNOSIS — N1831 Chronic kidney disease, stage 3a: Secondary | ICD-10-CM | POA: Diagnosis not present

## 2024-01-29 DIAGNOSIS — Z87891 Personal history of nicotine dependence: Secondary | ICD-10-CM | POA: Diagnosis not present

## 2024-01-29 DIAGNOSIS — Z9889 Other specified postprocedural states: Secondary | ICD-10-CM | POA: Diagnosis not present

## 2024-01-29 DIAGNOSIS — R270 Ataxia, unspecified: Secondary | ICD-10-CM | POA: Diagnosis not present

## 2024-01-29 DIAGNOSIS — I10 Essential (primary) hypertension: Secondary | ICD-10-CM | POA: Diagnosis not present

## 2024-01-29 DIAGNOSIS — I6523 Occlusion and stenosis of bilateral carotid arteries: Secondary | ICD-10-CM | POA: Diagnosis not present

## 2024-02-02 ENCOUNTER — Other Ambulatory Visit (INDEPENDENT_AMBULATORY_CARE_PROVIDER_SITE_OTHER): Payer: Self-pay | Admitting: Vascular Surgery

## 2024-02-11 ENCOUNTER — Encounter: Payer: Self-pay | Admitting: Nurse Practitioner

## 2024-02-11 ENCOUNTER — Ambulatory Visit (INDEPENDENT_AMBULATORY_CARE_PROVIDER_SITE_OTHER): Admitting: Nurse Practitioner

## 2024-02-11 VITALS — BP 118/56 | HR 74 | Temp 97.8°F | Ht 76.0 in | Wt 203.2 lb

## 2024-02-11 DIAGNOSIS — L259 Unspecified contact dermatitis, unspecified cause: Secondary | ICD-10-CM | POA: Diagnosis not present

## 2024-02-11 DIAGNOSIS — F39 Unspecified mood [affective] disorder: Secondary | ICD-10-CM | POA: Diagnosis not present

## 2024-02-11 DIAGNOSIS — L309 Dermatitis, unspecified: Secondary | ICD-10-CM | POA: Insufficient documentation

## 2024-02-11 MED ORDER — TRIAMCINOLONE ACETONIDE 0.1 % EX CREA: TOPICAL_CREAM | Freq: Two times a day (BID) | CUTANEOUS | 2 refills | Status: AC | PRN

## 2024-02-11 MED ORDER — SERTRALINE HCL 50 MG PO TABS: 50.0000 mg | ORAL_TABLET | Freq: Every day | ORAL | 2 refills | Status: AC

## 2024-02-11 NOTE — Progress Notes (Signed)
 [name], NP-C Phone: [phone]  Keith Cruz is a 83 y.o. male who presents today for follow up.   Discussed the use of AI scribe software for clinical note transcription with the patient, who gave verbal consent to proceed.  History of Present Illness   Keith Cruz is an 83 year old male who presents for follow up on initiation of Zoloft  and with a rash on his chest.  He has a rash on his chest that is intermittent, occasionally itchy, but with minimal pruritus. His wife describes the rash as scaly and dry. There have been no changes in soaps, lotions, or detergents that could have triggered the rash. The rash was once extensive but is currently less widespread.  He is currently taking sertraline  (Zoloft ) for mood, which his wife reports has made a noticeable difference in his demeanor, although he feels about the same. He takes this medication at bedtime. He denies being irritable. His wife manages his medication box as he is no longer able to do it himself.      Social History   Tobacco Use  Smoking Status Former   Current packs/day: 0.00   Average packs/day: 2.0 packs/day for 10.0 years (20.0 ttl pk-yrs)   Types: Cigarettes   Start date: 04/28/1960   Quit date: 04/28/1970   Years since quitting: 53.8  Smokeless Tobacco Former    Current Outpatient Medications on File Prior to Visit  Medication Sig Dispense Refill   ACCU-CHEK GUIDE TEST test strip TEST BLOOD GLUCOSE ONCE DAILY 100 strip 12   acetaminophen  (TYLENOL ) 500 MG tablet Take 500 mg by mouth every 6 (six) hours as needed.     albuterol  (PROAIR  HFA) 108 (90 Base) MCG/ACT inhaler INHALE 1-2 PUFFS INTO THE LUNGS EVERY 4 (FOUR) HOURS AS NEEDED FOR WHEEZING OR SHORTNESS OF BREATH. 18 each 12   apixaban  (ELIQUIS ) 2.5 MG TABS tablet TAKE 1 TABLET BY MOUTH TWICE A DAY 60 tablet 11   ARNUITY ELLIPTA  100 MCG/ACT AEPB Inhale 1 puff into the lungs daily.     atorvastatin  (LIPITOR ) 80 MG tablet TAKE 1 TABLET BY MOUTH EVERY  DAY 90 tablet 3   blood glucose meter kit and supplies Dispense based on patient and insurance preference. Use as directed to check blood glucose once daily. 1 each 0   Cholecalciferol  (VITAMIN D3) 2000 UNITS capsule Take 2,000 Units by mouth daily. am     famotidine  (PEPCID ) 40 MG tablet Take 40 mg by mouth 2 (two) times daily.     fexofenadine  (ALLEGRA ) 180 MG tablet Take 1 tablet (180 mg total) by mouth daily as needed for allergies or rhinitis. 90 tablet 3   fluticasone  (FLONASE ) 50 MCG/ACT nasal spray INSTILL 1 SPRAY INTO BOTH NOSTRILS DAILY 16 mL 2   folic acid  (FOLVITE ) 1 MG tablet Take 1 mg by mouth daily. am     hydrALAZINE  (APRESOLINE ) 50 MG tablet TAKE 1 TABLET BY MOUTH THREE TIMES A DAY 270 tablet 3   metoprolol  tartrate (LOPRESSOR ) 50 MG tablet TAKE 1 TABLET (50 MG TOTAL) BY MOUTH 2 (TWO) TIMES DAILY. D/C COREG  PT PREFERS METOPROLOL  180 tablet 3   Multiple Vitamins-Minerals (CENTRUM SILVER 50+MEN PO) Take by mouth.     ondansetron  (ZOFRAN -ODT) 4 MG disintegrating tablet Take 1 tablet (4 mg total) by mouth every 8 (eight) hours as needed for nausea or vomiting. 20 tablet 0   tamsulosin  (FLOMAX ) 0.4 MG CAPS capsule Take 0.4 mg by mouth daily.     Current  Facility-Administered Medications on File Prior to Visit  Medication Dose Route Frequency Provider Last Rate Last Admin   ipratropium-albuterol  (DUONEB) 0.5-2.5 (3) MG/3ML nebulizer solution 3 mL  3 mL Nebulization Once Edman Marsa PARAS, DO         ROS see history of present illness  Objective  Physical Exam Vitals:   02/11/24 1040  BP: (!) 118/56  Pulse: 74  Temp: 97.8 F (36.6 C)  SpO2: 93%    BP Readings from Last 3 Encounters:  02/11/24 (!) 118/56  12/29/23 (!) 124/58  12/13/23 (!) 164/72   Wt Readings from Last 3 Encounters:  02/11/24 203 lb 3.2 oz (92.2 kg)  12/29/23 207 lb (93.9 kg)  12/12/23 208 lb (94.3 kg)    Physical Exam Constitutional:      General: He is not in acute distress.     Appearance: Normal appearance.  HENT:     Head: Normocephalic.  Cardiovascular:     Rate and Rhythm: Normal rate and regular rhythm.     Heart sounds: Normal heart sounds.  Pulmonary:     Effort: Pulmonary effort is normal.     Breath sounds: Normal breath sounds.  Skin:    General: Skin is warm and dry.     Findings: Rash present.     Comments: Across chest, mild erythema and scaling noted. See pic below  Neurological:     General: No focal deficit present.     Mental Status: He is alert.  Psychiatric:        Mood and Affect: Mood normal.        Behavior: Behavior normal.      Assessment/Plan: Please see individual problem list.  Mood disorder Assessment & Plan: Mood has improved with sertraline , with no adverse effects reported. Continue sertraline  50 mg daily at bedtime and provide refills. Monitor for dosage adjustment if mood symptoms worsen. We will continue to monitor.  Orders: -     Sertraline  HCl; Take 1 tablet (50 mg total) by mouth at bedtime.  Dispense: 90 tablet; Refill: 2  Contact dermatitis, unspecified contact dermatitis type, unspecified trigger Assessment & Plan: Intermittent scaly, dry rash on the chest suggests eczema or dermatitis. Prescribe Kenalog  cream twice daily as needed. Recommend moisturizer like Aquaphor. Return precautions given to patient.   Orders: -     Triamcinolone  Acetonide; Apply topically 2 (two) times daily as needed. To rash on chest  Dispense: 30 g; Refill: 2     Return in about 3 months (around 05/13/2024) for Follow up.   [name], NP-C [facility] - [facility]

## 2024-02-11 NOTE — Assessment & Plan Note (Addendum)
 Intermittent scaly, dry rash on the chest suggests eczema or dermatitis. Prescribe Kenalog  cream twice daily as needed. Recommend moisturizer like Aquaphor. Return precautions given to patient.

## 2024-02-11 NOTE — Assessment & Plan Note (Signed)
 Mood has improved with sertraline , with no adverse effects reported. Continue sertraline  50 mg daily at bedtime and provide refills. Monitor for dosage adjustment if mood symptoms worsen. We will continue to monitor.

## 2024-02-15 ENCOUNTER — Telehealth: Payer: Self-pay

## 2024-02-15 NOTE — Telephone Encounter (Signed)
 [facility] home health orders placed in provider to be signed folder

## 2024-02-17 ENCOUNTER — Ambulatory Visit: Payer: Medicare Other | Admitting: *Deleted

## 2024-02-17 VITALS — Ht 74.0 in | Wt 198.0 lb

## 2024-02-17 DIAGNOSIS — Z0000 Encounter for general adult medical examination without abnormal findings: Secondary | ICD-10-CM

## 2024-02-17 NOTE — Patient Instructions (Signed)
 Mr. Keith Cruz,  Thank you for taking the time for your Medicare Wellness Visit. I appreciate your continued commitment to your health goals. Please review the care plan we discussed, and feel free to reach out if I can assist you further.  Medicare recommends these wellness visits once per year to help you and your care team stay ahead of potential health issues. These visits are designed to focus on prevention, allowing your provider to concentrate on managing your acute and chronic conditions during your regular appointments.  Please note that Annual Wellness Visits do not include a physical exam. Some assessments may be limited, especially if the visit was conducted virtually. If needed, we may recommend a separate in-person follow-up with your provider.  Ongoing Care Seeing your primary care provider every 3 to 6 months helps us  monitor your health and provide consistent, personalized care.  Consider updating your shingles and covid vaccines.  Referrals If a referral was made during today's visit and you haven't received any updates within two weeks, please contact the referred provider directly to check on the status.  Recommended Screenings:  Health Maintenance  Topic Date Due   Zoster (Shingles) Vaccine (1 of 2) Never done   COVID-19 Vaccine (4 - 2025-26 season) 12/28/2023   Medicare Annual Wellness Visit  02/16/2025   DTaP/Tdap/Td vaccine (2 - Td or Tdap) 07/04/2028   Pneumococcal Vaccine for age over 66  Completed   Flu Shot  Completed   Meningitis B Vaccine  Aged Out   Hepatitis C Screening  Discontinued       02/17/2024   10:24 AM  Advanced Directives  Does Patient Have a Medical Advance Directive? Yes  Type of Estate agent of [place];Living will  Copy of Healthcare Power of Attorney in Chart? No - copy requested   Advance Care Planning is important because it: Ensures you receive medical care that aligns with your values, goals, and  preferences. Provides guidance to your family and loved ones, reducing the emotional burden of decision-making during critical moments.  Vision: Annual vision screenings are recommended for early detection of glaucoma, cataracts, and diabetic retinopathy. These exams can also reveal signs of chronic conditions such as diabetes and high blood pressure.  Dental: Annual dental screenings help detect early signs of oral cancer, gum disease, and other conditions linked to overall health, including heart disease and diabetes.  Please see the attached documents for additional preventive care recommendations.

## 2024-02-17 NOTE — Progress Notes (Signed)
 Subjective:   Keith Cruz is a 83 y.o. who presents for a Medicare Wellness preventive visit.  As a reminder, Annual Wellness Visits don't include a physical exam, and some assessments may be limited, especially if this visit is performed virtually. We may recommend an in-person follow-up visit with your provider if needed.  Visit Complete: Virtual I connected with  Keith Cruz on 02/17/24 by a audio enabled telemedicine application and verified that I am speaking with the correct person using two identifiers.  Patient Location: Home  Provider Location: Home Office  I discussed the limitations of evaluation and management by telemedicine. The patient expressed understanding and agreed to proceed.  Vital Signs: Because this visit was a virtual/telehealth visit, some criteria may be missing or patient reported. Any vitals not documented were not able to be obtained and vitals that have been documented are patient reported.  VideoDeclined- This patient declined Librarian, academic. Therefore the visit was completed with audio only.  Persons Participating in Visit: Patient assisted by Keith Cruz.  AWV Questionnaire: No: Patient Medicare AWV questionnaire was not completed prior to this visit.  Cardiac Risk Factors include: advanced age (>100men, >43 women);male gender;dyslipidemia;sedentary lifestyle;hypertension;Other (see comment), Risk factor comments: AFib, CAD     Objective:    Today's Vitals   02/17/24 1010  Weight: 198 lb (89.8 kg)  Height: 6' 2 (1.88 m)   Body mass index is 25.42 kg/m.     02/17/2024   10:24 AM 12/12/2023    3:53 PM 12/12/2023   10:28 AM 11/16/2023   10:26 AM 11/03/2023   10:58 AM 11/03/2023   10:50 AM 10/21/2023   11:39 AM  Advanced Directives  Does Patient Have a Medical Advance Directive? Yes  No Yes Yes Yes Yes  Type of Estate agent of [place];Living will   Healthcare Power of [place];Living  will Living will;Healthcare Power of [employer] Power of [place];Living will Healthcare Power of [place];Living will  Does patient want to make changes to medical advance directive?     Yes (ED - Information included in AVS)    Copy of Healthcare Power of Attorney in Chart? No - copy requested   No - copy requested  No - copy requested No - copy requested  Would patient like information on creating a medical advance directive?  No - Patient declined         Current Medications (verified) Outpatient Encounter Medications as of 02/17/2024  Medication Sig   ACCU-CHEK GUIDE TEST test strip TEST BLOOD GLUCOSE ONCE DAILY   acetaminophen  (TYLENOL ) 500 MG tablet Take 500 mg by mouth every 6 (six) hours as needed.   albuterol  (PROAIR  HFA) 108 (90 Base) MCG/ACT inhaler INHALE 1-2 PUFFS INTO THE LUNGS EVERY 4 (FOUR) HOURS AS NEEDED FOR WHEEZING OR SHORTNESS OF BREATH.   apixaban  (ELIQUIS ) 2.5 MG TABS tablet TAKE 1 TABLET BY MOUTH TWICE A DAY   ARNUITY ELLIPTA  100 MCG/ACT AEPB Inhale 1 puff into the lungs daily.   atorvastatin  (LIPITOR ) 80 MG tablet TAKE 1 TABLET BY MOUTH EVERY DAY   blood glucose meter kit and supplies Dispense based on patient and insurance preference. Use as directed to check blood glucose once daily.   Cholecalciferol  (VITAMIN D3) 2000 UNITS capsule Take 2,000 Units by mouth daily. am   famotidine  (PEPCID ) 40 MG tablet Take 40 mg by mouth 2 (two) times daily.   fexofenadine  (ALLEGRA ) 180 MG tablet Take 1 tablet (180 mg total) by mouth  daily as needed for allergies or rhinitis.   fluticasone  (FLONASE ) 50 MCG/ACT nasal spray INSTILL 1 SPRAY INTO BOTH NOSTRILS DAILY   folic acid  (FOLVITE ) 1 MG tablet Take 1 mg by mouth daily. am   hydrALAZINE  (APRESOLINE ) 50 MG tablet TAKE 1 TABLET BY MOUTH THREE TIMES A DAY   metoprolol  tartrate (LOPRESSOR ) 50 MG tablet TAKE 1 TABLET (50 MG TOTAL) BY MOUTH 2 (TWO) TIMES DAILY. D/C COREG  PT PREFERS METOPROLOL    Multiple Vitamins-Minerals  (CENTRUM SILVER 50+MEN PO) Take by mouth.   ondansetron  (ZOFRAN -ODT) 4 MG disintegrating tablet Take 1 tablet (4 mg total) by mouth every 8 (eight) hours as needed for nausea or vomiting.   sertraline  (ZOLOFT ) 50 MG tablet Take 1 tablet (50 mg total) by mouth at bedtime.   tamsulosin  (FLOMAX ) 0.4 MG CAPS capsule Take 0.4 mg by mouth daily.   triamcinolone  cream (KENALOG ) 0.1 % Apply topically 2 (two) times daily as needed. To rash on chest   Facility-Administered Encounter Medications as of 02/17/2024  Medication   ipratropium-albuterol  (DUONEB) 0.5-2.5 (3) MG/3ML nebulizer solution 3 mL    Allergies (verified) Fexofenadine -pseudoephed er, Nifedipine, Pseudoephedrine, Anoro ellipta  [umeclidinium-vilanterol], Carvedilol , Doxycycline  hyclate, Prednisone, and Roflumilast   History: Past Medical History:  Diagnosis Date   Arthritis    back   Asthma    Atherosclerosis of aorta    Atherosclerosis of aorta    Bronchitis 05/2017   chronic   CAD (coronary artery disease)    Carotid artery disease    Carotid stenosis    Carotid stenosis, bilateral    Chronic bronchitis (HCC)    Chronic cough    COPD (chronic obstructive pulmonary disease) (HCC)    Dysrhythmia    GERD (gastroesophageal reflux disease)    Heart murmur    HOH (hard of hearing)    hearing aids   Hypercholesteremia    Hypertension    Kidney disease    stent   Macular degeneration of left eye    Myocardial infarction (HCC) 2016   Pre-diabetes    Seasonal allergies    Seizure (HCC)    Seizures (HCC)    viral meningitis   Stroke [facility])    1991 and imaging 05/12/15 old strokes   TIA (transient ischemic attack) 1991   x2   Uses hearing aid    Viral meningitis 2017   Viral meningitis    Wears dentures    upper and lower   Past Surgical History:  Procedure Laterality Date   CAROTID ENDARTERECTOMY Left 2014   CAROTID ENDARTERECTOMY Right 1991   CAROTID PTA/STENT INTERVENTION Left 05/31/2020   Procedure: CAROTID  PTA/STENT INTERVENTIO;  Surgeon: Marea Selinda RAMAN, MD;  Location: [facility] INVASIVE CV LAB;  Service: Cardiovascular;  Laterality: Left;   CATARACT EXTRACTION W/PHACO Left 06/24/2017   Procedure: CATARACT EXTRACTION PHACO AND INTRAOCULAR LENS PLACEMENT (IOC) LEFT BORDERLINE DIABETIC;  Surgeon: Mittie Gaskin, MD;  Location: [facility];  Service: Ophthalmology;  Laterality: Left;   CATARACT EXTRACTION W/PHACO Right 07/22/2017   Procedure: CATARACT EXTRACTION PHACO AND INTRAOCULAR LENS PLACEMENT (IOC) RIGHT BORDERLINE DIABETIC;  Surgeon: Mittie Gaskin, MD;  Location: [facility];  Service: Ophthalmology;  Laterality: Right;   CORONARY ARTERY BYPASS GRAFT     x5   ESOPHAGEAL DILATION  11/16/2023   Procedure: DILATION, ESOPHAGUS;  Surgeon: Jinny Carmine, MD;  Location: [facility] ENDOSCOPY;  Service: Endoscopy;;   ESOPHAGOGASTRODUODENOSCOPY N/A 11/16/2023   Procedure: EGD (ESOPHAGOGASTRODUODENOSCOPY);  Surgeon: Jinny Carmine, MD;  Location: [facility] ENDOSCOPY;  Service: Endoscopy;  Laterality: N/A;   ESOPHAGOGASTRODUODENOSCOPY N/A 10/21/2023   Procedure: EGD (ESOPHAGOGASTRODUODENOSCOPY);  Surgeon: Jinny Carmine, MD;  Location: [facility] ENDOSCOPY;  Service: Endoscopy;  Laterality: N/A;   SKIN LESION EXCISION Left    left ear   URETERAL STENT PLACEMENT     Family History  Problem Relation Age of Onset   Stroke Mother    Heart attack Mother    Dementia Mother    Stroke Father    Stroke Sister    Alzheimer's disease Sister    Dementia Sister    Dementia Sister    Stroke Brother    Alzheimer's disease Maternal Grandmother    Dementia Maternal Grandmother    Stroke Paternal Grandmother    Stroke Paternal Grandfather    Alzheimer's disease Paternal Grandfather    Cancer Neg Hx    Social History   Socioeconomic History   Marital status: Married    Spouse name: Not on file   Number of children: Not on file   Years of education: Not on file   Highest education level: 10th grade   Occupational History   Not on file  Tobacco Use   Smoking status: Former    Current packs/day: 0.00    Average packs/day: 2.0 packs/day for 10.0 years (20.0 ttl pk-yrs)    Types: Cigarettes    Start date: 04/28/1960    Quit date: 04/28/1970    Years since quitting: 53.8   Smokeless tobacco: Former  Building services engineer status: Never Used  Substance and Sexual Activity   Alcohol use: No    Alcohol/week: 0.0 standard drinks of alcohol    Comment: previous   Drug use: No   Sexual activity: Yes    Comment: Keith   Other Topics Concern   Not on file  Social History Narrative   Re-married x 3 years Keith named Audiological scientist pets, 1 dog.    Retired    Investment banker, operational, wears seat belt, safe in relationship    2 sons and 1 daughter    6 years [employer]    Social Drivers of Corporate investment banker Strain: Low Risk  (02/17/2024)   Overall Financial Resource Strain (CARDIA)    Difficulty of Paying Living Expenses: Not hard at all  Food Insecurity: No Food Insecurity (02/17/2024)   Hunger Vital Sign    Worried About Running Out of Food in the Last Year: Never true    Ran Out of Food in the Last Year: Never true  Transportation Needs: No Transportation Needs (02/17/2024)   PRAPARE - Administrator, Civil Service (Medical): No    Lack of Transportation (Non-Medical): No  Physical Activity: Inactive (02/17/2024)   Exercise Vital Sign    Days of Exercise per Week: 0 days    Minutes of Exercise per Session: 0 min  Stress: No Stress Concern Present (02/17/2024)   [employer] of Occupational Health - Occupational Stress Questionnaire    Feeling of Stress: Only a little  Social Connections: Moderately Isolated (02/17/2024)   Social Connection and Isolation Panel    Frequency of Communication with Friends and Family: More than three times a week    Frequency of Social Gatherings with Friends and Family: Twice a week    Attends Religious Services: Never    Automotive engineer or Organizations: No    Attends Banker Meetings: Never    Marital Status: Married    Tobacco Counseling Counseling given: Not  Answered    Clinical Intake:  Pre-visit preparation completed: Yes  Pain : No/denies pain Pain Type: Chronic pain Pain Location: Back     BMI - recorded: 25.42 Nutritional Status: BMI 25 -29 Overweight Nutritional Risks: None Diabetes: No  Lab Results  Component Value Date   HGBA1C 6.1 (H) 12/12/2023   HGBA1C 5.9 11/12/2022   HGBA1C 6.1 05/13/2022     How often do you need to have someone help you when you read instructions, pamphlets, or other written materials from your doctor or pharmacy?: 1 - Never  Interpreter Needed?: No  Information entered by :: R. Brentney Goldbach LPN   Activities of Daily Living     02/17/2024   10:13 AM 12/12/2023    3:00 PM  In your present state of health, do you have any difficulty performing the following activities:  Hearing? 1   Vision? 0   Difficulty concentrating or making decisions? 1   Walking or climbing stairs? 1   Dressing or bathing? 1   Doing errands, shopping? 1 0  Preparing Food and eating ? Y   Comment can feed himself   Using the Toilet? N   In the past six months, have you accidently leaked urine? N   Do you have problems with loss of bowel control? N   Managing your Medications? Y   Managing your Finances? Y   Housekeeping or managing your Housekeeping? Y     Patient Care Team: Gretel App, NP as PCP - General (Nurse Practitioner) Rennie Cindy SAUNDERS, MD as Consulting Physician (Oncology) Florencio Cara BIRCH, MD as Consulting Physician (Cardiology) Marea Selinda RAMAN, MD as Referring Physician (Vascular Surgery) Thomasena Sherran JAYSON DEVONNA  I have updated your Care Teams any recent Medical Services you may have received from other providers in the past year.     Assessment:   This is a routine wellness examination for Keith Cruz.  Hearing/Vision screen Hearing  Screening - Comments:: Hearing loss has aids but does not wear Vision Screening - Comments:: readers   Goals Addressed             This Visit's Progress    Patient Stated       Wants to get rid of his back pain       Depression Screen     02/17/2024   10:19 AM 02/11/2024   10:41 AM 12/29/2023    4:01 PM 05/15/2023   10:55 AM 02/16/2023    2:25 PM 05/28/2022   11:44 AM 05/13/2022    1:05 PM  PHQ 2/9 Scores  PHQ - 2 Score 1 4 4 5 4 2  0  PHQ- 9 Score 10 14 11 13 19       Fall Risk     02/17/2024   10:16 AM 02/11/2024   10:41 AM 02/13/2023   10:57 AM 05/13/2022    1:04 PM 02/14/2022   11:24 AM  Fall Risk   Falls in the past year? 1 1 1 1 1   Number falls in past yr: 1 1 1 1  0  Comment     Fell on L knee. Did not seek medical attention.  Injury with Fall? 1 1 0 0 0  Risk for fall due to : History of fall(s);Impaired balance/gait;Impaired mobility History of fall(s) History of fall(s) History of fall(s)   Follow up Falls prevention discussed Falls evaluation completed Falls evaluation completed;Falls prevention discussed Falls evaluation completed  Falls evaluation completed      Data saved with a previous  flowsheet row definition    MEDICARE RISK AT HOME:  Medicare Risk at Home Any stairs in or around the home?: No If so, are there any without handrails?: No Home free of loose throw rugs in walkways, pet beds, electrical cords, etc?: Yes Adequate lighting in your home to reduce risk of falls?: Yes Life alert?: Yes Use of a cane, walker or w/c?: Yes Grab bars in the bathroom?: Yes Shower chair or bench in shower?: Yes Elevated toilet seat or a handicapped toilet?: Yes  TIMED UP AND GO:  Was the test performed?  No  Cognitive Function: 6CIT completed        02/17/2024   10:25 AM 02/16/2023    2:34 PM 02/14/2022   11:39 AM 02/11/2021   10:37 AM 02/08/2019    9:26 AM  6CIT Screen  What Year? 4 points 4 points 0 points 0 points 0 points  What month? 0 points  3 points 0 points 0 points 0 points  What time? 0 points 3 points 0 points 0 points 0 points  Count back from 20 0 points 4 points 0 points  0 points  Months in reverse 4 points 4 points 0 points 0 points 0 points  Repeat phrase 10 points 8 points 0 points  0 points  Total Score 18 points 26 points 0 points  0 points    Immunizations Immunization History  Administered Date(s) Administered   Fluad Quad(high Dose 65+) 12/27/2021   INFLUENZA, HIGH DOSE SEASONAL PF 01/22/2015, 12/31/2017, 12/15/2018, 12/15/2019, 12/27/2020, 01/24/2023, 12/29/2023   Influenza-Unspecified 02/23/2015, 01/03/2017, 12/31/2017, 12/15/2018   PFIZER(Purple Top)SARS-COV-2 Vaccination 06/27/2019, 07/18/2019, 01/24/2023   Pneumococcal Conjugate-13 12/10/2015   Pneumococcal Polysaccharide-23 05/11/2018   Respiratory Syncytial Virus Vaccine,Recomb Aduvanted(Arexvy) 12/27/2021   Tdap 07/05/2018    Screening Tests Health Maintenance  Topic Date Due   Zoster Vaccines- Shingrix (1 of 2) Never done   COVID-19 Vaccine (4 - 2025-26 season) 12/28/2023   Medicare Annual Wellness (AWV)  02/16/2024   DTaP/Tdap/Td (2 - Td or Tdap) 07/04/2028   Pneumococcal Vaccine: 50+ Years  Completed   Influenza Vaccine  Completed   Meningococcal B Vaccine  Aged Out   Hepatitis C Screening  Discontinued    Health Maintenance Items Addressed: Patient declines shingles and covid vaccines  Additional Screening:  Vision Screening: Recommended annual ophthalmology exams for early detection of glaucoma and other disorders of the eye. Is the patient up to date with their annual eye exam?  Yes  Who is the provider or what is the name of the office in which the patient attends annual eye exams?  [facility]  Dental Screening: Recommended annual dental exams for proper oral hygiene  Community Resource Referral / Chronic Care Management: CRR required this visit?  No   CCM required this visit?  No   Plan:    I have personally  reviewed and noted the following in the patient's chart:   Medical and social history Use of alcohol, tobacco or illicit drugs  Current medications and supplements including opioid prescriptions. Patient is not currently taking opioid prescriptions. Functional ability and status Nutritional status Physical activity Advanced directives List of other physicians Hospitalizations, surgeries, and ER visits in previous 12 months Vitals Screenings to include cognitive, depression, and falls Referrals and appointments  In addition, I have reviewed and discussed with patient certain preventive protocols, quality metrics, and best practice recommendations. A written personalized care plan for preventive services as well as general preventive health recommendations were provided to  patient.   Angeline Fredericks, LPN   [id number]   After Visit Summary: (MyChart) Due to this being a telephonic visit, the after visit summary with patients personalized plan was offered to patient via MyChart   Notes: Nothing significant to report at this time.

## 2024-02-29 ENCOUNTER — Telehealth: Payer: Self-pay

## 2024-02-29 NOTE — Telephone Encounter (Signed)
 [facility] home health order placed in provider to be signed folder

## 2024-03-02 NOTE — Telephone Encounter (Signed)
 Form faxed to [facility] [phone]

## 2024-03-06 ENCOUNTER — Emergency Department

## 2024-03-06 ENCOUNTER — Emergency Department
Admission: EM | Admit: 2024-03-06 | Discharge: 2024-03-06 | Disposition: A | Discharge status: Home / Self Care | Attending: Emergency Medicine | Admitting: Emergency Medicine

## 2024-03-06 ENCOUNTER — Other Ambulatory Visit: Payer: Self-pay

## 2024-03-06 ENCOUNTER — Encounter: Payer: Self-pay | Admitting: Emergency Medicine

## 2024-03-06 DIAGNOSIS — I2510 Atherosclerotic heart disease of native coronary artery without angina pectoris: Secondary | ICD-10-CM | POA: Diagnosis not present

## 2024-03-06 DIAGNOSIS — I10 Essential (primary) hypertension: Secondary | ICD-10-CM | POA: Diagnosis not present

## 2024-03-06 DIAGNOSIS — Z7901 Long term (current) use of anticoagulants: Secondary | ICD-10-CM | POA: Insufficient documentation

## 2024-03-06 DIAGNOSIS — Z79899 Other long term (current) drug therapy: Secondary | ICD-10-CM | POA: Insufficient documentation

## 2024-03-06 DIAGNOSIS — R1032 Left lower quadrant pain: Secondary | ICD-10-CM | POA: Diagnosis present

## 2024-03-06 DIAGNOSIS — J449 Chronic obstructive pulmonary disease, unspecified: Secondary | ICD-10-CM | POA: Diagnosis not present

## 2024-03-06 DIAGNOSIS — I4891 Unspecified atrial fibrillation: Secondary | ICD-10-CM | POA: Insufficient documentation

## 2024-03-06 DIAGNOSIS — R7303 Prediabetes: Secondary | ICD-10-CM | POA: Diagnosis not present

## 2024-03-06 DIAGNOSIS — K5900 Constipation, unspecified: Secondary | ICD-10-CM | POA: Insufficient documentation

## 2024-03-06 DIAGNOSIS — Z8673 Personal history of transient ischemic attack (TIA), and cerebral infarction without residual deficits: Secondary | ICD-10-CM | POA: Insufficient documentation

## 2024-03-06 LAB — COMPREHENSIVE METABOLIC PANEL WITH GFR
ALT: 28 U/L (ref 0–44)
AST: 42 U/L — ABNORMAL HIGH (ref 15–41)
Albumin: 3.9 g/dL (ref 3.5–5.0)
Alkaline Phosphatase: 53 U/L (ref 38–126)
Anion gap: 15 (ref 5–15)
BUN: 17 mg/dL (ref 8–23)
CO2: 23 mmol/L (ref 22–32)
Calcium: 8.8 mg/dL — ABNORMAL LOW (ref 8.9–10.3)
Chloride: 99 mmol/L (ref 98–111)
Creatinine, Ser: 1.12 mg/dL (ref 0.61–1.24)
GFR, Estimated: >60 mL/min (ref >60)
Glucose, Bld: 147 mg/dL — ABNORMAL HIGH (ref 70–99)
Potassium: 4.2 mmol/L (ref 3.5–5.1)
Sodium: 137 mmol/L (ref 135–145)
Total Bilirubin: 0.9 mg/dL (ref 0.0–1.2)
Total Protein: 7.7 g/dL (ref 6.5–8.1)

## 2024-03-06 LAB — TYPE AND SCREEN
ABO/RH(D): O POS
Antibody Screen: NEGATIVE

## 2024-03-06 LAB — CBC
HCT: 33.6 % — ABNORMAL LOW (ref 39.0–52.0)
Hemoglobin: 11 g/dL — ABNORMAL LOW (ref 13.0–17.0)
MCH: 36.5 pg — ABNORMAL HIGH (ref 26.0–34.0)
MCHC: 32.7 g/dL (ref 30.0–36.0)
MCV: 111.6 fL — ABNORMAL HIGH (ref 80.0–100.0)
Platelets: 148 K/uL — ABNORMAL LOW (ref 150–400)
RBC: 3.01 MIL/uL — ABNORMAL LOW (ref 4.22–5.81)
RDW: 15.2 % (ref 11.5–15.5)
WBC: 7.8 K/uL (ref 4.0–10.5)
nRBC: 0 % (ref 0.0–0.2)

## 2024-03-06 MED ORDER — IOHEXOL 300 MG/ML  SOLN
100.0000 mL | Freq: Once | INTRAMUSCULAR | Status: AC | PRN
  Administered 2024-03-06: 100 mL via INTRAVENOUS

## 2024-03-06 NOTE — ED Provider Notes (Signed)
 [facility] Provider Note    Event Date/Time   First MD Initiated Contact with Patient 03/06/24 2041     (approximate)   History   Rectal Bleeding   HPI  Keith Cruz is a 83 y.o. male patient history of fairly extensive heart disease A-fib on Eliquis  last dose this morning.  Both he and his wife very pleasant they report he has never had any issues with GI bleeding abdominal pain severe constipation or other.  He has had issues with esophageal stenosis in the past but is not suffering any active symptoms.  Throughout the last couple days patient has been feeling constipated occasional discomfort to left lower abdomen.  Has been taking multiple different laxatives, did a fleets enema as well.  He was feeling like he is very constipated difficulty passing stool.  Still passing gas still eating and drinking normally.  Took his medications this morning including Eliquis .  He noticed this evening that he started having bloody stool.  Pain is slightly improving and he had a bowel movement just before coming to the ER which was small but otherwise noted to be normal except bloody.  No fevers or chills.  Denies ever having any history of bleeding problem or GI bleeding apically does not have constipation.   Past Medical History:  Diagnosis Date   Arthritis    back   Asthma    Atherosclerosis of aorta    Atherosclerosis of aorta    Bronchitis 05/2017   chronic   CAD (coronary artery disease)    Carotid artery disease    Carotid stenosis    Carotid stenosis, bilateral    Chronic bronchitis (HCC)    Chronic cough    COPD (chronic obstructive pulmonary disease) (HCC)    Dysrhythmia    GERD (gastroesophageal reflux disease)    Heart murmur    HOH (hard of hearing)    hearing aids   Hypercholesteremia    Hypertension    Kidney disease    stent   Macular degeneration of left eye    Myocardial infarction (HCC) 2016   Pre-diabetes    Seasonal allergies     Seizure (HCC)    Seizures (HCC)    viral meningitis   Stroke [facility])    1991 and imaging 05/12/15 old strokes   TIA (transient ischemic attack) 1991   x2   Uses hearing aid    Viral meningitis 2017   Viral meningitis    Wears dentures    upper and lower     Physical Exam   Triage Vital Signs: ED Triage Vitals [03/06/24 1611]  Encounter Vitals Group     BP (!) 122/59     Girls Systolic BP Percentile      Girls Diastolic BP Percentile      Boys Systolic BP Percentile      Boys Diastolic BP Percentile      Pulse Rate 70     Resp 18     Temp 98.3 F (36.8 C)     Temp Source Oral     SpO2 96 %     Weight 198 lb (89.8 kg)     Height 6' 2 (1.88 m)     Head Circumference      Peak Flow      Pain Score 4     Pain Loc      Pain Education      Exclude from Growth Chart     Most  recent vital signs: Vitals:   03/06/24 2030 03/06/24 2215  BP: (!) 176/67 (!) 153/64  Pulse: 76 67  Resp:    Temp:    SpO2: 96% 97%     General: Awake, no distress.  He is pleasant wife very pleasant the bedside CV:  Good peripheral perfusion.  Normal rate no murmur Resp:  Normal effort.  Abd:  No distention.  Soft nontender nondistended throughout majority except reports mild to moderate discomfort to palpation in the suprapubic and slight in the left lower quadrant.  There seems to be a sense of fullness in the left lower abdomen as well. Other:  Rectal exam performed by me.  Brown stool, no obvious rectal impaction that is reachable at this time.  Patient denies rectal pain on exam   ED Results / Procedures / Treatments   Labs (all labs ordered are listed, but only abnormal results are displayed) Labs Reviewed  COMPREHENSIVE METABOLIC PANEL WITH GFR - Abnormal; Notable for the following components:      Result Value   Glucose, Bld 147 (*)    Calcium  8.8 (*)    AST 42 (*)    All other components within normal limits  CBC - Abnormal; Notable for the following components:   RBC  3.01 (*)    Hemoglobin 11.0 (*)    HCT 33.6 (*)    MCV 111.6 (*)    MCH 36.5 (*)    Platelets 148 (*)    All other components within normal limits  TYPE AND SCREEN      RADIOLOGY  CT ABDOMEN PELVIS W CONTRAST Result Date: 03/06/2024 EXAM: CT ABDOMEN AND PELVIS WITH CONTRAST 03/06/2024 10:41:06 PM TECHNIQUE: CT of the abdomen and pelvis was performed with the administration of 100 mL of iohexol (OMNIPAQUE) 300 MG/ML solution. Multiplanar reformatted images are provided for review. Automated exposure control, iterative reconstruction, and/or weight-based adjustment of the mA/kV was utilized to reduce the radiation dose to as low as reasonably achievable. COMPARISON: 06/04/2022 CLINICAL HISTORY: LLQ abdominal pain; LLQ pain, feels 'constipation', no noted fecal impaction by rectal exam, rectal bleeding. eval for abd pathology FINDINGS: LOWER CHEST: Small hiatal hernia. LIVER: The liver is unremarkable. GALLBLADDER AND BILE DUCTS: Gallbladder is unremarkable. No biliary ductal dilatation. SPLEEN: No acute abnormality. PANCREAS: No acute abnormality. ADRENAL GLANDS: No acute abnormality. KIDNEYS, URETERS AND BLADDER: No stones in the kidneys or ureters. No hydronephrosis. No perinephric or periureteral stranding. Urinary bladder is unremarkable. GI AND BOWEL: Stomach demonstrates no acute abnormality. Normal appendix (image 43). There is no bowel obstruction. PERITONEUM AND RETROPERITONEUM: No ascites. No free air. VASCULATURE: Atherosclerotic calcifications of the abdominal aorta and branch vessels, although patent. LYMPH NODES: No lymphadenopathy. REPRODUCTIVE ORGANS: The prostate is unremarkable. BONES AND SOFT TISSUES: Mild degenerative changes at L4-L5. Tiny fat-containing bilateral inguinal hernias. No acute osseous abnormality. No focal soft tissue abnormality. IMPRESSION: 1. No acute findings in the abdomen or pelvis. Electronically signed by: Pinkie Pebbles MD 03/06/2024 10:45 PM EST RP  Workstation: [id number]      PROCEDURES:  Critical Care performed: No  Procedures   MEDICATIONS ORDERED IN ED: Medications  iohexol (OMNIPAQUE) 300 MG/ML solution 100 mL (100 mLs Intravenous Contrast Given 03/06/24 2228)     IMPRESSION / MDM / ASSESSMENT AND PLAN / ED COURSE  I reviewed the triage vital signs and the nursing notes.  Differential diagnosis includes, but is not limited to, primarily near painless rectal bleeding but having sensation of constipation fullness and there is some associated discomfort in the mid to left lower quadrant.  No fevers or chills.  No elevated white count no obvious infectious symptoms he is not vomiting he is passing flatus.  I am concerned based on his history of possible obstruction mass tumor lesion severe constipation fecal impaction etc.  Discussed with patient agreeable with plan to proceed with CT imaging to evaluate further.  His hemoglobin is stable and he is on Eliquis  last dose this morning.  Hemodynamics are normal and he does not have evidence of massive GI bleeding on rectal exam and his stool is brown and formed.  Query etiology unknown at this point but proceeding with imaging.  Patient's presentation is most consistent with acute complicated illness / injury requiring diagnostic workup.      Clinical Course as of 03/06/24 2314  [place] Mar 06, 2024  2300 Patient had a large brown formed bowel movement just prior to going to CT scan.  He got significant improvement in symptoms after.  No blood.  Symptoms improved.  Awaiting results of CT scan. [MQ]    Clinical Course User Index [MQ] Dicky Anes, MD   ----------------------------------------- 11:13 PM on 03/06/2024 ----------------------------------------- Patient reports full relief of symptoms.  Resting comfortably.  Will continue to utilize gentle laxative for the next several days to keep stools from becoming firm again.  Based on his history  clinical findings and assessment at this time suspect patient likely had a fecal impaction or severe constipation that is now alleviated and symptoms have abated.  He has no evidence of blood in his stool at this time.  Patient will continue his home medications careful return precautions discussed and recommendation for primary care follow-up also  Return precautions and treatment recommendations and follow-up discussed with the patient who is agreeable with the plan.  Patient reports he feels well requesting discharge  FINAL CLINICAL IMPRESSION(S) / ED DIAGNOSES   Final diagnoses:  Constipation, unspecified constipation type     Rx / DC Orders   ED Discharge Orders     None        Note:  This document was prepared using Dragon voice recognition software and may include unintentional dictation errors.   Dicky Anes, MD 03/06/24 2314

## 2024-03-06 NOTE — Discharge Instructions (Addendum)
 ? ?  Please return to the emergency room right away if you are to develop a fever, severe nausea, your pain becomes severe or worsens, you are unable to keep food down, begin vomiting any dark or bloody fluid, you develop any dark or bloody stools, feel dehydrated, or other new concerns or symptoms arise. ? ?

## 2024-03-06 NOTE — ED Triage Notes (Signed)
 Patient arrives in wheelchair by POV c/o rectal bleeding and pain onset of this afternoon. Patient on eliquis .

## 2024-03-09 ENCOUNTER — Telehealth: Payer: Self-pay

## 2024-03-09 NOTE — Telephone Encounter (Signed)
 [facility] form placed in provider to be signed folder

## 2024-03-09 NOTE — Telephone Encounter (Signed)
 Faxed

## 2024-04-07 ENCOUNTER — Other Ambulatory Visit: Payer: Self-pay | Admitting: *Deleted

## 2024-04-07 DIAGNOSIS — D46Z Other myelodysplastic syndromes: Secondary | ICD-10-CM

## 2024-04-15 ENCOUNTER — Telehealth: Payer: Self-pay | Admitting: *Deleted

## 2024-04-15 NOTE — Telephone Encounter (Signed)
 [facility] paperwork faxed Specimen to be collected and shipped on Monday.

## 2024-04-18 ENCOUNTER — Inpatient Hospital Stay: Attending: Internal Medicine

## 2024-04-18 DIAGNOSIS — D46Z Other myelodysplastic syndromes: Secondary | ICD-10-CM

## 2024-04-18 DIAGNOSIS — D469 Myelodysplastic syndrome, unspecified: Secondary | ICD-10-CM | POA: Insufficient documentation

## 2024-04-18 LAB — CMP (CANCER CENTER ONLY)
ALT: 27 U/L (ref 0–44)
AST: 32 U/L (ref 15–41)
Albumin: 4.1 g/dL (ref 3.5–5.0)
Alkaline Phosphatase: 58 U/L (ref 38–126)
Anion gap: 12 (ref 5–15)
BUN: 17 mg/dL (ref 8–23)
CO2: 27 mmol/L (ref 22–32)
Calcium: 9.4 mg/dL (ref 8.9–10.3)
Chloride: 100 mmol/L (ref 98–111)
Creatinine: 1.23 mg/dL (ref 0.61–1.24)
GFR, Estimated: 58 mL/min — ABNORMAL LOW
Glucose, Bld: 124 mg/dL — ABNORMAL HIGH (ref 70–99)
Potassium: 4.3 mmol/L (ref 3.5–5.1)
Sodium: 139 mmol/L (ref 135–145)
Total Bilirubin: 0.5 mg/dL (ref 0.0–1.2)
Total Protein: 7.2 g/dL (ref 6.5–8.1)

## 2024-04-18 LAB — LACTATE DEHYDROGENASE: LDH: 198 U/L (ref 105–235)

## 2024-04-18 LAB — FERRITIN: Ferritin: 63 ng/mL (ref 24–336)

## 2024-04-18 LAB — CBC WITH DIFFERENTIAL (CANCER CENTER ONLY)
Abs Immature Granulocytes: 0.02 K/uL (ref 0.00–0.07)
Basophils Absolute: 0 K/uL (ref 0.0–0.1)
Basophils Relative: 1 %
Eosinophils Absolute: 0 K/uL (ref 0.0–0.5)
Eosinophils Relative: 1 %
HCT: 32.6 % — ABNORMAL LOW (ref 39.0–52.0)
Hemoglobin: 10.6 g/dL — ABNORMAL LOW (ref 13.0–17.0)
Immature Granulocytes: 0 %
Lymphocytes Relative: 20 %
Lymphs Abs: 1.1 K/uL (ref 0.7–4.0)
MCH: 36.8 pg — ABNORMAL HIGH (ref 26.0–34.0)
MCHC: 32.5 g/dL (ref 30.0–36.0)
MCV: 113.2 fL — ABNORMAL HIGH (ref 80.0–100.0)
Monocytes Absolute: 0.5 K/uL (ref 0.1–1.0)
Monocytes Relative: 9 %
Neutro Abs: 4.1 K/uL (ref 1.7–7.7)
Neutrophils Relative %: 69 %
Platelet Count: 131 K/uL — ABNORMAL LOW (ref 150–400)
RBC: 2.88 MIL/uL — ABNORMAL LOW (ref 4.22–5.81)
RDW: 14.8 % (ref 11.5–15.5)
WBC Count: 5.9 K/uL (ref 4.0–10.5)
nRBC: 0 % (ref 0.0–0.2)

## 2024-04-18 LAB — IRON AND TIBC
Iron: 173 ug/dL (ref 45–182)
Saturation Ratios: 52 % — ABNORMAL HIGH (ref 17.9–39.5)
TIBC: 330 ug/dL (ref 250–450)
UIBC: 157 ug/dL

## 2024-04-18 LAB — VITAMIN B12: Vitamin B-12: 618 pg/mL (ref 180–914)

## 2024-04-19 LAB — KAPPA/LAMBDA LIGHT CHAINS
Kappa free light chain: 37.6 mg/L — ABNORMAL HIGH (ref 3.3–19.4)
Kappa, lambda light chain ratio: 2.46 — ABNORMAL HIGH (ref 0.26–1.65)
Lambda free light chains: 15.3 mg/L (ref 5.7–26.3)

## 2024-04-20 LAB — MULTIPLE MYELOMA PANEL, SERUM
Albumin SerPl Elph-Mcnc: 3.4 g/dL (ref 2.9–4.4)
Albumin/Glob SerPl: 1.1 (ref 0.7–1.7)
Alpha 1: 0.2 g/dL (ref 0.0–0.4)
Alpha2 Glob SerPl Elph-Mcnc: 0.7 g/dL (ref 0.4–1.0)
B-Globulin SerPl Elph-Mcnc: 1.4 g/dL — ABNORMAL HIGH (ref 0.7–1.3)
Gamma Glob SerPl Elph-Mcnc: 0.8 g/dL (ref 0.4–1.8)
Globulin, Total: 3.1 g/dL (ref 2.2–3.9)
IgA: 632 mg/dL — ABNORMAL HIGH (ref 61–437)
IgG (Immunoglobin G), Serum: 1014 mg/dL (ref 603–1613)
IgM (Immunoglobulin M), Srm: 52 mg/dL (ref 15–143)
M Protein SerPl Elph-Mcnc: 0.5 g/dL — ABNORMAL HIGH
Total Protein ELP: 6.5 g/dL (ref 6.0–8.5)

## 2024-04-25 ENCOUNTER — Telehealth: Payer: Self-pay | Admitting: *Deleted

## 2024-04-25 NOTE — Telephone Encounter (Signed)
 Received faxed message and a testing question from [facility]. I called jessica back and left VM to only do testing on [facility] testing on his peripheral blood that was sent to them.

## 2024-04-27 LAB — MISCELLANEOUS TEST

## 2024-05-05 ENCOUNTER — Inpatient Hospital Stay: Attending: Internal Medicine | Admitting: Internal Medicine

## 2024-05-05 ENCOUNTER — Encounter: Payer: Self-pay | Admitting: Internal Medicine

## 2024-05-05 VITALS — BP 102/47 | HR 64 | Temp 97.3°F | Resp 18 | Ht 74.0 in | Wt 195.0 lb

## 2024-05-05 DIAGNOSIS — D631 Anemia in chronic kidney disease: Secondary | ICD-10-CM | POA: Diagnosis not present

## 2024-05-05 DIAGNOSIS — Z809 Family history of malignant neoplasm, unspecified: Secondary | ICD-10-CM | POA: Insufficient documentation

## 2024-05-05 DIAGNOSIS — N401 Enlarged prostate with lower urinary tract symptoms: Secondary | ICD-10-CM | POA: Diagnosis not present

## 2024-05-05 DIAGNOSIS — I252 Old myocardial infarction: Secondary | ICD-10-CM | POA: Insufficient documentation

## 2024-05-05 DIAGNOSIS — Z87891 Personal history of nicotine dependence: Secondary | ICD-10-CM | POA: Insufficient documentation

## 2024-05-05 DIAGNOSIS — E7800 Pure hypercholesterolemia, unspecified: Secondary | ICD-10-CM | POA: Diagnosis not present

## 2024-05-05 DIAGNOSIS — D46Z Other myelodysplastic syndromes: Secondary | ICD-10-CM

## 2024-05-05 DIAGNOSIS — Z8661 Personal history of infections of the central nervous system: Secondary | ICD-10-CM | POA: Diagnosis not present

## 2024-05-05 DIAGNOSIS — I2510 Atherosclerotic heart disease of native coronary artery without angina pectoris: Secondary | ICD-10-CM | POA: Diagnosis not present

## 2024-05-05 DIAGNOSIS — D469 Myelodysplastic syndrome, unspecified: Secondary | ICD-10-CM | POA: Diagnosis present

## 2024-05-05 DIAGNOSIS — D472 Monoclonal gammopathy: Secondary | ICD-10-CM | POA: Diagnosis not present

## 2024-05-05 DIAGNOSIS — Z8673 Personal history of transient ischemic attack (TIA), and cerebral infarction without residual deficits: Secondary | ICD-10-CM | POA: Diagnosis not present

## 2024-05-05 DIAGNOSIS — I4891 Unspecified atrial fibrillation: Secondary | ICD-10-CM | POA: Insufficient documentation

## 2024-05-05 DIAGNOSIS — R350 Frequency of micturition: Secondary | ICD-10-CM | POA: Insufficient documentation

## 2024-05-05 DIAGNOSIS — Z7901 Long term (current) use of anticoagulants: Secondary | ICD-10-CM | POA: Diagnosis not present

## 2024-05-05 DIAGNOSIS — K219 Gastro-esophageal reflux disease without esophagitis: Secondary | ICD-10-CM | POA: Insufficient documentation

## 2024-05-05 DIAGNOSIS — N183 Chronic kidney disease, stage 3 unspecified: Secondary | ICD-10-CM | POA: Insufficient documentation

## 2024-05-05 DIAGNOSIS — I700 Atherosclerosis of aorta: Secondary | ICD-10-CM | POA: Diagnosis not present

## 2024-05-05 DIAGNOSIS — J4489 Other specified chronic obstructive pulmonary disease: Secondary | ICD-10-CM | POA: Diagnosis not present

## 2024-05-05 DIAGNOSIS — M129 Arthropathy, unspecified: Secondary | ICD-10-CM | POA: Diagnosis not present

## 2024-05-05 DIAGNOSIS — I129 Hypertensive chronic kidney disease with stage 1 through stage 4 chronic kidney disease, or unspecified chronic kidney disease: Secondary | ICD-10-CM | POA: Diagnosis not present

## 2024-05-05 DIAGNOSIS — Z79899 Other long term (current) drug therapy: Secondary | ICD-10-CM | POA: Insufficient documentation

## 2024-05-05 NOTE — Assessment & Plan Note (Addendum)
#   Anemia: Hemoglobin:[[name] 2023] 12 MCV-110; normal white count/platelets.   [name] 2024-B12 folic acid  normal [PCP]; FEB 7th, CT scan AP-negative for hepatosplenomegaly or cirrhosis.# MARCH 2024-low-grade MDS - bone marrow biopsy hypercellular; clonal plasma cells noted; myeloma panel FISH panel negative.  [employer]ogenetics normal.  [facility]- NGS- F-ONE: TET2; SRSF2   # Today again reviewed the pathophysiology of low-grade MDS the patient and his wife.  I also again reviewed the treatment options of MDS include growth factor injections versus chemotherapy.   Again given no obvious clinical symptoms from underlying MDS-I would recommend surveillance.   # MGUS: immunofixation shows IgA monoclonal protein with kappa light chain specificity.  Kappa lambda light chain ratio= 2.72.  Bone marrow biopsy- March 2024-6% plasma cells.   MM  FISH PANEL- NEGATIVE- Again no role for any therapy at this time. Continue surveillance every 6 months  or so labs. Stable.   # COPD /cough-defer to pulmonary/Dr.Fleming-for further recommendations. Stable.   # Chronic kidney disease stage III-[Dr.Singh]-Stable; Urinary hesitancy- defer to Dr.singh/ on Tamulosin.  .   # Chronic back pain-unrelated to MDS/MGUS; awaiting ablation with [facility]. Stable.   # A.fib/CAD [Dr.Kowalski/[facility]] - on eliquis - Stable.   # DISPOSITION: # follow up in 6 month MD; 2 weeks PRIOR- labs- cbc/cmp; LDH; MM panel; K/l light chains; iron studies;ferritin-; b12 levels; EPO LEVEL -  Dr.B

## 2024-05-05 NOTE — Progress Notes (Signed)
 Unsure about urinary issues, wife states he says he's going to try and pee. Sees Dr. Dennise.

## 2024-05-05 NOTE — Progress Notes (Signed)
 [facility] CONSULT NOTE  Patient Care Team: Gretel App, NP as PCP - General (Nurse Practitioner) Rennie Cindy SAUNDERS, MD as Consulting Physician (Oncology) Florencio Cara BIRCH, MD as Consulting Physician (Cardiology) Marea Selinda RAMAN, MD as Referring Physician (Vascular Surgery) Cantwell, Sherran BROCKS, PA-C  CHIEF COMPLAINTS/PURPOSE OF CONSULTATION: ANEMIA  HEMATOLOGY HISTORY  # ANEMIA[Hb; MCV-platelets- WBC; Iron sat; ferritin;  GFR- CT/US ; EGD/colonoscopy-  # Anemia: Hemoglobin:[[name] 2023] 12 MCV-110; normal white count/platelets.   [name] 2024-B12 folic acid  normal [PCP]; FEB 7th, CT scan AP-negative for hepatosplenomegaly or cirrhosis.  MARCH 2024-bone marrow biopsy hypercellular; clonal plasma cells noted 6%; M-protein negative; kappa lambda light chain ratio= slightly abnormal;  myeloma panel FISH panel negative.  Cytogenetics normal.  MARCH 2024- foundation 1 heme-MDS panel.  HISTORY OF PRESENTING ILLNESS: HOH; inability because of back pain.  Accompanied by his wife.  Keith Cruz 84 y.o.  male patient with multiple medical problems including COPD/A-fib on Eliquis  CKD stage III and LOW GRADE MDS with mild anemia; MGUS on surveillance is here to review the results  blood work.   Discussed the use of AI scribe software for clinical note transcription with the patient, who gave verbal consent to proceed.  History of Present Illness   Keith Cruz is an 84 year old male with low grade myelodysplastic syndrome and monoclonal gammopathy of undetermined significance who presents for routine hematology/oncology follow-up to monitor cytopenias and disease stability.  He has low grade myelodysplastic syndrome with a slow, gradual decline in blood counts since 2024. Hemoglobin has ranged from 12 to 10.8 g/dL over the past two years, and platelet counts have been mildly decreased over the last six months (146, 148, and 131 x10^9/L).  Monoclonal gammopathy of undetermined  significance is stable, with M protein levels of 0.4 to 0.5 g/dL and an unchanged kappa/lambda light chain ratio.  He experiences increased urinary frequency and occasional difficulty with urination, including urgency without ability to void. He does not consider this a significant problem. He is followed by nephrology, has a stent in one kidney, and takes tamsulosin  for benign prostatic hyperplasia.       Review of Systems  Constitutional:  Positive for malaise/fatigue. Negative for chills and diaphoresis.  HENT:  Negative for nosebleeds and sore throat.   Eyes:  Negative for double vision.  Respiratory:  Negative for cough, hemoptysis, sputum production, shortness of breath and wheezing.   Cardiovascular:  Negative for chest pain, palpitations, orthopnea and leg swelling.  Gastrointestinal:  Negative for abdominal pain, blood in stool, constipation, diarrhea, heartburn, melena, nausea and vomiting.  Genitourinary:  Negative for dysuria, frequency and urgency.  Musculoskeletal:  Positive for back pain and joint pain.  Skin: Negative.  Negative for itching and rash.  Neurological:  Negative for dizziness, tingling, focal weakness, weakness and headaches.  Endo/Heme/Allergies:  Does not bruise/bleed easily.  Psychiatric/Behavioral:  Negative for depression. The patient is not nervous/anxious and does not have insomnia.      MEDICAL HISTORY:  Past Medical History:  Diagnosis Date   Arthritis    back   Asthma    Atherosclerosis of aorta    Atherosclerosis of aorta    Bronchitis 05/2017   chronic   CAD (coronary artery disease)    Carotid artery disease    Carotid stenosis    Carotid stenosis, bilateral    Chronic bronchitis (HCC)    Chronic cough    COPD (chronic obstructive pulmonary disease) (HCC)    Dysrhythmia    GERD (  gastroesophageal reflux disease)    Heart murmur    HOH (hard of hearing)    hearing aids   Hypercholesteremia    Hypertension    Kidney disease    stent    Macular degeneration of left eye    Myocardial infarction (HCC) 2016   Pre-diabetes    Seasonal allergies    Seizure (HCC)    Seizures (HCC)    viral meningitis   Stroke [facility])    1991 and imaging 05/12/15 old strokes   TIA (transient ischemic attack) 1991   x2   Uses hearing aid    Viral meningitis 2017   Viral meningitis    Wears dentures    upper and lower    SURGICAL HISTORY: Past Surgical History:  Procedure Laterality Date   CAROTID ENDARTERECTOMY Left 2014   CAROTID ENDARTERECTOMY Right 1991   CAROTID PTA/STENT INTERVENTION Left 05/31/2020   Procedure: CAROTID PTA/STENT INTERVENTIO;  Surgeon: Marea Selinda RAMAN, MD;  Location: [facility] INVASIVE CV LAB;  Service: Cardiovascular;  Laterality: Left;   CATARACT EXTRACTION W/PHACO Left 06/24/2017   Procedure: CATARACT EXTRACTION PHACO AND INTRAOCULAR LENS PLACEMENT (IOC) LEFT BORDERLINE DIABETIC;  Surgeon: Mittie Gaskin, MD;  Location: [facility];  Service: Ophthalmology;  Laterality: Left;   CATARACT EXTRACTION W/PHACO Right 07/22/2017   Procedure: CATARACT EXTRACTION PHACO AND INTRAOCULAR LENS PLACEMENT (IOC) RIGHT BORDERLINE DIABETIC;  Surgeon: Mittie Gaskin, MD;  Location: [facility];  Service: Ophthalmology;  Laterality: Right;   CORONARY ARTERY BYPASS GRAFT     x5   ESOPHAGEAL DILATION  11/16/2023   Procedure: DILATION, ESOPHAGUS;  Surgeon: Jinny Carmine, MD;  Location: [facility] ENDOSCOPY;  Service: Endoscopy;;   ESOPHAGOGASTRODUODENOSCOPY N/A 11/16/2023   Procedure: EGD (ESOPHAGOGASTRODUODENOSCOPY);  Surgeon: Jinny Carmine, MD;  Location: [facility];  Service: Endoscopy;  Laterality: N/A;   ESOPHAGOGASTRODUODENOSCOPY N/A 10/21/2023   Procedure: EGD (ESOPHAGOGASTRODUODENOSCOPY);  Surgeon: Jinny Carmine, MD;  Location: [facility] ENDOSCOPY;  Service: Endoscopy;  Laterality: N/A;   SKIN LESION EXCISION Left    left ear   URETERAL STENT PLACEMENT      SOCIAL HISTORY: Social History   Socioeconomic History    Marital status: Married    Spouse name: Not on file   Number of children: Not on file   Years of education: Not on file   Highest education level: 10th grade  Occupational History   Not on file  Tobacco Use   Smoking status: Former    Current packs/day: 0.00    Average packs/day: 2.0 packs/day for 10.0 years (20.0 ttl pk-yrs)    Types: Cigarettes    Start date: 04/28/1960    Quit date: 04/28/1970    Years since quitting: 54.0   Smokeless tobacco: Former  Building Services Engineer status: Never Used  Substance and Sexual Activity   Alcohol use: No    Alcohol/week: 0.0 standard drinks of alcohol    Comment: previous   Drug use: No   Sexual activity: Yes    Comment: wife   Other Topics Concern   Not on file  Social History Narrative   Re-married x 3 years wife named Audiological Scientist pets, 1 dog.    Retired    Investment banker, operational, wears seat belt, safe in relationship    2 sons and 1 daughter    6 years [employer]    Social Drivers of Health   Tobacco Use: Medium Risk (05/05/2024)   Patient History    Smoking Tobacco Use: Former  Smokeless Tobacco Use: Former    Passive Exposure: Not on Actuary Strain: Low Risk (02/17/2024)   Overall Financial Resource Strain (CARDIA)    Difficulty of Paying Living Expenses: Not hard at all  Food Insecurity: No Food Insecurity (02/17/2024)   Epic    Worried About Programme Researcher, Broadcasting/film/video in the Last Year: Never true    Ran Out of Food in the Last Year: Never true  Transportation Needs: No Transportation Needs (02/17/2024)   Epic    Lack of Transportation (Medical): No    Lack of Transportation (Non-Medical): No  Physical Activity: Inactive (02/17/2024)   Exercise Vital Sign    Days of Exercise per Week: 0 days    Minutes of Exercise per Session: 0 min  Stress: No Stress Concern Present (02/17/2024)   [employer] of Occupational Health - Occupational Stress Questionnaire    Feeling of Stress: Only a little  Social Connections:  Moderately Isolated (02/17/2024)   Social Connection and Isolation Panel    Frequency of Communication with Friends and Family: More than three times a week    Frequency of Social Gatherings with Friends and Family: Twice a week    Attends Religious Services: Never    Database Administrator or Organizations: No    Attends Banker Meetings: Never    Marital Status: Married  Catering Manager Violence: Not At Risk (02/17/2024)   Epic    Fear of Current or Ex-Partner: No    Emotionally Abused: No    Physically Abused: No    Sexually Abused: No  Depression (PHQ2-9): High Risk (05/05/2024)   Depression (PHQ2-9)    PHQ-2 Score: 11  Alcohol Screen: Low Risk (02/17/2024)   Alcohol Screen    Last Alcohol Screening Score (AUDIT): 0  Housing: Unknown (02/17/2024)   Epic    Unable to Pay for Housing in the Last Year: No    Number of Times Moved in the Last Year: Not on file    Homeless in the Last Year: No  Utilities: Not At Risk (02/17/2024)   Epic    Threatened with loss of utilities: No  Health Literacy: Adequate Health Literacy (02/17/2024)   B1300 Health Literacy    Frequency of need for help with medical instructions: Never    FAMILY HISTORY: Family History  Problem Relation Age of Onset   Stroke Mother    Heart attack Mother    Dementia Mother    Stroke Father    Stroke Sister    Alzheimer's disease Sister    Dementia Sister    Dementia Sister    Stroke Brother    Alzheimer's disease Maternal Grandmother    Dementia Maternal Grandmother    Stroke Paternal Grandmother    Stroke Paternal Grandfather    Alzheimer's disease Paternal Grandfather    Cancer Neg Hx     ALLERGIES:  is allergic to fexofenadine -pseudoephed er, nifedipine, pseudoephedrine, anoro ellipta  [umeclidinium-vilanterol], carvedilol , doxycycline  hyclate, prednisone, and roflumilast.  MEDICATIONS:  Current Outpatient Medications  Medication Sig Dispense Refill   ACCU-CHEK GUIDE TEST test  strip TEST BLOOD GLUCOSE ONCE DAILY 100 strip 12   acetaminophen  (TYLENOL ) 500 MG tablet Take 500 mg by mouth every 6 (six) hours as needed.     albuterol  (PROAIR  HFA) 108 (90 Base) MCG/ACT inhaler INHALE 1-2 PUFFS INTO THE LUNGS EVERY 4 (FOUR) HOURS AS NEEDED FOR WHEEZING OR SHORTNESS OF BREATH. 18 each 12   apixaban  (ELIQUIS ) 2.5 MG TABS tablet TAKE 1  TABLET BY MOUTH TWICE A DAY 60 tablet 11   ARNUITY ELLIPTA  100 MCG/ACT AEPB Inhale 1 puff into the lungs daily.     atorvastatin  (LIPITOR ) 80 MG tablet TAKE 1 TABLET BY MOUTH EVERY DAY 90 tablet 3   blood glucose meter kit and supplies Dispense based on patient and insurance preference. Use as directed to check blood glucose once daily. 1 each 0   Cholecalciferol  (VITAMIN D3) 2000 UNITS capsule Take 2,000 Units by mouth daily. am     famotidine  (PEPCID ) 40 MG tablet Take 40 mg by mouth 2 (two) times daily.     fexofenadine  (ALLEGRA ) 180 MG tablet Take 1 tablet (180 mg total) by mouth daily as needed for allergies or rhinitis. 90 tablet 3   fluticasone  (FLONASE ) 50 MCG/ACT nasal spray INSTILL 1 SPRAY INTO BOTH NOSTRILS DAILY 16 mL 2   folic acid  (FOLVITE ) 1 MG tablet Take 1 mg by mouth daily. am     hydrALAZINE  (APRESOLINE ) 50 MG tablet TAKE 1 TABLET BY MOUTH THREE TIMES A DAY 270 tablet 3   metoprolol  tartrate (LOPRESSOR ) 50 MG tablet TAKE 1 TABLET (50 MG TOTAL) BY MOUTH 2 (TWO) TIMES DAILY. D/C COREG  PT PREFERS METOPROLOL  180 tablet 3   Multiple Vitamins-Minerals (CENTRUM SILVER 50+MEN PO) Take by mouth.     ondansetron  (ZOFRAN -ODT) 4 MG disintegrating tablet Take 1 tablet (4 mg total) by mouth every 8 (eight) hours as needed for nausea or vomiting. 20 tablet 0   sertraline  (ZOLOFT ) 50 MG tablet Take 1 tablet (50 mg total) by mouth at bedtime. 90 tablet 2   tamsulosin  (FLOMAX ) 0.4 MG CAPS capsule Take 0.4 mg by mouth daily.     triamcinolone  cream (KENALOG ) 0.1 % Apply topically 2 (two) times daily as needed. To rash on chest 30 g 2   Current  Facility-Administered Medications  Medication Dose Route Frequency Provider Last Rate Last Admin   ipratropium-albuterol  (DUONEB) 0.5-2.5 (3) MG/3ML nebulizer solution 3 mL  3 mL Nebulization Once Karamalegos, Alexander J, DO         .  PHYSICAL EXAMINATION:   Vitals:   05/05/24 1026  BP: (!) 102/47  Pulse: 64  Resp: 18  Temp: (!) 97.3 F (36.3 C)  SpO2: 100%   Filed Weights   05/05/24 1026  Weight: 195 lb (88.5 kg)    Physical Exam Vitals and nursing note reviewed.  HENT:     Head: Normocephalic and atraumatic.     Mouth/Throat:     Pharynx: Oropharynx is clear.  Eyes:     Extraocular Movements: Extraocular movements intact.     Pupils: Pupils are equal, round, and reactive to light.  Cardiovascular:     Rate and Rhythm: Normal rate and regular rhythm.  Pulmonary:     Comments: Decreased breath sounds bilaterally.  Abdominal:     Palpations: Abdomen is soft.  Musculoskeletal:        General: Normal range of motion.     Cervical back: Normal range of motion.  Skin:    General: Skin is warm.  Neurological:     General: No focal deficit present.     Mental Status: He is alert and oriented to person, place, and time.  Psychiatric:        Behavior: Behavior normal.        Judgment: Judgment normal.      LABORATORY DATA:  I have reviewed the data as listed Lab Results  Component Value Date   WBC 5.9 04/18/2024  HGB 10.6 (L) 04/18/2024   HCT 32.6 (L) 04/18/2024   MCV 113.2 (H) 04/18/2024   PLT 131 (L) 04/18/2024   Recent Labs    12/12/23 1049 03/06/24 1613 04/18/24 1143  NA 140 137 139  K 3.9 4.2 4.3  CL 105 99 100  CO2 25 23 27   GLUCOSE 122* 147* 124*  BUN 19 17 17   CREATININE 1.33* 1.12 1.23  CALCIUM  8.6* 8.8* 9.4  GFRNONAA 53* >60 58*  PROT 7.0 7.7 7.2  ALBUMIN 3.5 3.9 4.1  AST 29 42* 32  ALT 21 28 27   ALKPHOS 47 53 58  BILITOT 1.0 0.9 0.5     No results found.   ASSESSMENT & PLAN:   MDS (myelodysplastic syndrome), low grade  (HCC) # Anemia: Hemoglobin:[[name] 2023] 12 MCV-110; normal white count/platelets.   [name] 2024-B12 folic acid  normal [PCP]; FEB 7th, CT scan AP-negative for hepatosplenomegaly or cirrhosis.# MARCH 2024-low-grade MDS - bone marrow biopsy hypercellular; clonal plasma cells noted; myeloma panel FISH panel negative.  Cytogenetics normal.  Foundation 1 hem- NGS- F-ONE: TET2; SRSF2   # Today again reviewed the pathophysiology of low-grade MDS the patient and his wife.  I also again reviewed the treatment options of MDS include growth factor injections versus chemotherapy.   Again given no obvious clinical symptoms from underlying MDS-I would recommend surveillance.   # MGUS: immunofixation shows IgA monoclonal protein with kappa light chain specificity.  Kappa lambda light chain ratio= 2.72.  Bone marrow biopsy- March 2024-6% plasma cells.   MM  FISH PANEL- NEGATIVE- Again no role for any therapy at this time. Continue surveillance every 6 months  or so labs. Stable.   # COPD /cough-defer to pulmonary/Dr.Fleming-for further recommendations. Stable.   # Chronic kidney disease stage III-[Dr.Singh]-Stable; Urinary hesitancy- defer to Dr.singh/ on Tamulosin.  .   # Chronic back pain-unrelated to MDS/MGUS; awaiting ablation with [facility]. Stable.   # A.fib/CAD [Dr.Kowalski/[facility]-Cards] - on eliquis - Stable.   # DISPOSITION: # follow up in 6 month MD; 2 weeks PRIOR- labs- cbc/cmp; LDH; MM panel; K/l light chains; iron studies;ferritin-; b12 levels; EPO LEVEL -  Dr.B  All questions were answered. The patient knows to call the clinic with any problems, questions or concerns.   Cindy JONELLE Joe, MD 05/06/2024 9:02 AM

## 2024-05-09 ENCOUNTER — Encounter: Payer: Self-pay | Admitting: Internal Medicine

## 2024-05-19 ENCOUNTER — Ambulatory Visit (INDEPENDENT_AMBULATORY_CARE_PROVIDER_SITE_OTHER): Admitting: Nurse Practitioner

## 2024-05-19 ENCOUNTER — Encounter: Payer: Self-pay | Admitting: Nurse Practitioner

## 2024-05-19 VITALS — BP 100/61 | HR 68 | Ht 74.0 in | Wt 194.2 lb

## 2024-05-19 DIAGNOSIS — I480 Paroxysmal atrial fibrillation: Secondary | ICD-10-CM | POA: Diagnosis not present

## 2024-05-19 DIAGNOSIS — F39 Unspecified mood [affective] disorder: Secondary | ICD-10-CM | POA: Diagnosis not present

## 2024-05-19 DIAGNOSIS — K219 Gastro-esophageal reflux disease without esophagitis: Secondary | ICD-10-CM

## 2024-05-19 DIAGNOSIS — I10 Essential (primary) hypertension: Secondary | ICD-10-CM | POA: Diagnosis not present

## 2024-05-19 DIAGNOSIS — D46Z Other myelodysplastic syndromes: Secondary | ICD-10-CM | POA: Diagnosis not present

## 2024-05-19 DIAGNOSIS — N1831 Chronic kidney disease, stage 3a: Secondary | ICD-10-CM | POA: Diagnosis not present

## 2024-05-19 DIAGNOSIS — M2769 Other endosseous dental implant failure: Secondary | ICD-10-CM | POA: Diagnosis not present

## 2024-05-19 NOTE — Progress Notes (Signed)
 " [name], NP-C Phone: [phone]  Keith Cruz is a 84 y.o. male who presents today for follow up.   Discussed the use of AI scribe software for clinical note transcription with the patient, who gave verbal consent to proceed.  History of Present Illness   Keith Cruz is an 84 year old male with MDS who presents for routine follow-up.  He is under surveillance for MDS with biannual check-ups. He last saw his hematologist two weeks ago, with no changes in his condition since then. He is currently taking Lipitor  and Eliquis , with no changes in his medication regimen. No new symptoms such as shortness of breath, chest pain, or dizziness. He occasionally checks his blood pressure at home, which he states is okay.  He has been on Zoloft  since September and reports that his mood is stable. He describes himself as someone who worries about everything but does not feel the need for an increase in his medication.  He has a history of acid reflux and esophageal stricture. He reports difficulty swallowing and has had his esophagus stretched previously, but it could not be stretched enough. He experiences significant acid reflux and prefers pantoprazole  over other medications like Pepcid  AC, which he finds difficult to swallow.  His appetite is poor, and he eats very little, often blaming it on his teeth. He primarily consumes soft foods like biscuits and mashed potatoes. He occasionally drinks Atkins protein shakes to supplement his diet. Breakfast is his largest meal of the day, where he typically eats sausage and biscuits. He does not consume much meat, except for sausage at breakfast, and occasionally eats hamburger. He enjoys fruits like mandarin oranges and tangerines, and occasionally eats turnip greens.      Tobacco Use History[1]  Medications Ordered Prior to Encounter[2]   ROS see history of present illness  Objective  Physical Exam Vitals:   05/19/24 1046  BP: 100/61   Pulse: 68  SpO2: 97%    BP Readings from Last 3 Encounters:  05/19/24 100/61  05/05/24 (!) 102/47  03/06/24 (!) 153/64   Wt Readings from Last 3 Encounters:  05/19/24 194 lb 3.2 oz (88.1 kg)  05/05/24 195 lb (88.5 kg)  03/06/24 198 lb (89.8 kg)    Physical Exam Constitutional:      General: He is not in acute distress.    Appearance: Normal appearance.  HENT:     Head: Normocephalic.  Cardiovascular:     Rate and Rhythm: Normal rate and regular rhythm.     Heart sounds: Normal heart sounds.  Pulmonary:     Effort: Pulmonary effort is normal.     Breath sounds: Normal breath sounds.  Skin:    General: Skin is warm and dry.  Neurological:     General: No focal deficit present.     Mental Status: He is alert.  Psychiatric:        Mood and Affect: Mood normal.        Behavior: Behavior normal.      Assessment/Plan: Please see individual problem list.  Gastroesophageal reflux disease without esophagitis Assessment & Plan: Chronic GERD with dysphagia persists despite previous dilation. Prefers pantoprazole  for management. Stop pepcid . Prescribed pantoprazole  for acid reflux and advised thorough chewing to aid swallowing. We will continue to monitor. Consider referral to GI for EGD if worsening.   Orders: -     Pantoprazole  Sodium; Take 1 tablet (40 mg total) by mouth daily.  Dispense: 90 tablet; Refill: 3  Mood disorder Assessment & Plan: Mood has improved with sertraline , with no adverse effects reported. Continue sertraline  50 mg daily at bedtime. Monitor for dosage adjustment if mood symptoms worsen. We will continue to monitor.   MDS (myelodysplastic syndrome), low grade (HCC) Assessment & Plan: Managed by Hematology/Oncology. Current plan of surveillance every  6 months. Follow up as scheduled.    Disorder of dental prosthesis Assessment & Plan: There are no teeth on the bottom, contributing to difficulty eating and subsequent weight loss. Continue  follow-up with the dentist for denture adjustments as gums heal. Encouraged soft, easy to chew foods and liquids. Encouraged increased protein and caloric intake. Suggested nutritional supplements like Boost or Ensure.    Stage 3a chronic kidney disease [facility]) Assessment & Plan: Follow up with Nephrology as scheduled February 4th.    Essential hypertension Assessment & Plan: Chronic. Stable on Lopressor  50 mg BID and Hydralazine  50 mg TID. Continue. Follow up with Cardiology.    Paroxysmal atrial fibrillation (HCC) Assessment & Plan: Stable on Eliquis  5 mg BID. Continue. Follow up with Cardiology and Vascular as scheduled.      Return in about 6 months (around 11/16/2024) for Follow up.   [name], NP-C [facility] [place]      [1]  Social History Tobacco Use  Smoking Status Former   Current packs/day: 0.00   Average packs/day: 2.0 packs/day for 10.0 years (20.0 ttl pk-yrs)   Types: Cigarettes   Start date: 04/28/1960   Quit date: 04/28/1970   Years since quitting: 54.1  Smokeless Tobacco Former  [2]  Current Outpatient Medications on File Prior to Visit  Medication Sig Dispense Refill   ACCU-CHEK GUIDE TEST test strip TEST BLOOD GLUCOSE ONCE DAILY 100 strip 12   acetaminophen  (TYLENOL ) 500 MG tablet Take 500 mg by mouth every 6 (six) hours as needed.     albuterol  (PROAIR  HFA) 108 (90 Base) MCG/ACT inhaler INHALE 1-2 PUFFS INTO THE LUNGS EVERY 4 (FOUR) HOURS AS NEEDED FOR WHEEZING OR SHORTNESS OF BREATH. 18 each 12   apixaban  (ELIQUIS ) 2.5 MG TABS tablet TAKE 1 TABLET BY MOUTH TWICE A DAY 60 tablet 11   ARNUITY ELLIPTA  100 MCG/ACT AEPB Inhale 1 puff into the lungs daily.     atorvastatin  (LIPITOR ) 80 MG tablet TAKE 1 TABLET BY MOUTH EVERY DAY 90 tablet 3   blood glucose meter kit and supplies Dispense based on patient and insurance preference. Use as directed to check blood glucose once daily. 1 each 0   Cholecalciferol  (VITAMIN D3) 2000 UNITS  capsule Take 2,000 Units by mouth daily. am     fexofenadine  (ALLEGRA ) 180 MG tablet Take 1 tablet (180 mg total) by mouth daily as needed for allergies or rhinitis. 90 tablet 3   fluticasone  (FLONASE ) 50 MCG/ACT nasal spray INSTILL 1 SPRAY INTO BOTH NOSTRILS DAILY 16 mL 2   folic acid  (FOLVITE ) 1 MG tablet Take 1 mg by mouth daily. am     hydrALAZINE  (APRESOLINE ) 50 MG tablet TAKE 1 TABLET BY MOUTH THREE TIMES A DAY 270 tablet 3   metoprolol  tartrate (LOPRESSOR ) 50 MG tablet TAKE 1 TABLET (50 MG TOTAL) BY MOUTH 2 (TWO) TIMES DAILY. D/C COREG  PT PREFERS METOPROLOL  180 tablet 3   Multiple Vitamins-Minerals (CENTRUM SILVER 50+MEN PO) Take by mouth.     ondansetron  (ZOFRAN -ODT) 4 MG disintegrating tablet Take 1 tablet (4 mg total) by mouth every 8 (eight) hours as needed for nausea or vomiting. 20 tablet 0  sertraline  (ZOLOFT ) 50 MG tablet Take 1 tablet (50 mg total) by mouth at bedtime. 90 tablet 2   tamsulosin  (FLOMAX ) 0.4 MG CAPS capsule Take 0.4 mg by mouth daily.     triamcinolone  cream (KENALOG ) 0.1 % Apply topically 2 (two) times daily as needed. To rash on chest 30 g 2   Current Facility-Administered Medications on File Prior to Visit  Medication Dose Route Frequency Provider Last Rate Last Admin   ipratropium-albuterol  (DUONEB) 0.5-2.5 (3) MG/3ML nebulizer solution 3 mL  3 mL Nebulization Once Karamalegos, Alexander J, DO       "

## 2024-05-26 ENCOUNTER — Encounter: Payer: Self-pay | Admitting: Nurse Practitioner

## 2024-05-26 MED ORDER — PANTOPRAZOLE SODIUM 40 MG PO TBEC: 40.0000 mg | DELAYED_RELEASE_TABLET | Freq: Every day | ORAL | 3 refills | Status: AC

## 2024-05-26 NOTE — Assessment & Plan Note (Signed)
 Follow up with Nephrology as scheduled February 4th.

## 2024-05-26 NOTE — Assessment & Plan Note (Signed)
 Managed by Hematology/Oncology. Current plan of surveillance every  6 months. Follow up as scheduled.

## 2024-05-26 NOTE — Assessment & Plan Note (Addendum)
 Chronic GERD with dysphagia persists despite previous dilation. Prefers pantoprazole  for management. Stop pepcid . Prescribed pantoprazole  for acid reflux and advised thorough chewing to aid swallowing. We will continue to monitor. Consider referral to GI for EGD if worsening.

## 2024-05-26 NOTE — Assessment & Plan Note (Signed)
 Chronic. Stable on Lopressor 50 mg BID and Hydralazine 50 mg TID. Continue. Follow up with Cardiology.

## 2024-05-26 NOTE — Assessment & Plan Note (Signed)
 Mood has improved with sertraline , with no adverse effects reported. Continue sertraline  50 mg daily at bedtime. Monitor for dosage adjustment if mood symptoms worsen. We will continue to monitor.

## 2024-05-26 NOTE — Assessment & Plan Note (Signed)
 Stable on Eliquis 5 mg BID. Continue. Follow up with Cardiology and Vascular as scheduled.

## 2024-05-26 NOTE — Assessment & Plan Note (Signed)
 There are no teeth on the bottom, contributing to difficulty eating and subsequent weight loss. Continue follow-up with the dentist for denture adjustments as gums heal. Encouraged soft, easy to chew foods and liquids. Encouraged increased protein and caloric intake. Suggested nutritional supplements like Boost or Ensure.

## 2024-10-07 ENCOUNTER — Encounter (INDEPENDENT_AMBULATORY_CARE_PROVIDER_SITE_OTHER)

## 2024-10-07 ENCOUNTER — Ambulatory Visit (INDEPENDENT_AMBULATORY_CARE_PROVIDER_SITE_OTHER): Admitting: Vascular Surgery

## 2024-10-25 ENCOUNTER — Inpatient Hospital Stay

## 2024-11-08 ENCOUNTER — Inpatient Hospital Stay: Admitting: Internal Medicine

## 2024-11-17 ENCOUNTER — Ambulatory Visit: Admitting: Nurse Practitioner

## 2025-02-21 ENCOUNTER — Ambulatory Visit
